# Patient Record
Sex: Female | Born: 1960 | Race: White | Hispanic: No | Smoking: Former smoker
Health system: Southern US, Community
[De-identification: ages and names within clinical notes are randomized; demographics above are authoritative.]

## PROBLEM LIST (undated history)

## (undated) DIAGNOSIS — M542 Cervicalgia: Secondary | ICD-10-CM

## (undated) DIAGNOSIS — M1990 Unspecified osteoarthritis, unspecified site: Secondary | ICD-10-CM

## (undated) DIAGNOSIS — E559 Vitamin D deficiency, unspecified: Secondary | ICD-10-CM

## (undated) DIAGNOSIS — K859 Acute pancreatitis without necrosis or infection, unspecified: Secondary | ICD-10-CM

## (undated) DIAGNOSIS — G8929 Other chronic pain: Secondary | ICD-10-CM

## (undated) DIAGNOSIS — J449 Chronic obstructive pulmonary disease, unspecified: Secondary | ICD-10-CM

## (undated) DIAGNOSIS — M792 Neuralgia and neuritis, unspecified: Secondary | ICD-10-CM

## (undated) DIAGNOSIS — M7918 Myalgia, other site: Secondary | ICD-10-CM

## (undated) DIAGNOSIS — K219 Gastro-esophageal reflux disease without esophagitis: Secondary | ICD-10-CM

## (undated) DIAGNOSIS — Z7901 Long term (current) use of anticoagulants: Secondary | ICD-10-CM

## (undated) DIAGNOSIS — A0472 Enterocolitis due to Clostridium difficile, not specified as recurrent: Secondary | ICD-10-CM

## (undated) DIAGNOSIS — M25561 Pain in right knee: Secondary | ICD-10-CM

## (undated) DIAGNOSIS — E119 Type 2 diabetes mellitus without complications: Secondary | ICD-10-CM

## (undated) DIAGNOSIS — K432 Incisional hernia without obstruction or gangrene: Secondary | ICD-10-CM

## (undated) DIAGNOSIS — G4730 Sleep apnea, unspecified: Secondary | ICD-10-CM

## (undated) DIAGNOSIS — M545 Low back pain, unspecified: Secondary | ICD-10-CM

## (undated) DIAGNOSIS — I2699 Other pulmonary embolism without acute cor pulmonale: Secondary | ICD-10-CM

## (undated) DIAGNOSIS — F1190 Opioid use, unspecified, uncomplicated: Secondary | ICD-10-CM

## (undated) DIAGNOSIS — F419 Anxiety disorder, unspecified: Secondary | ICD-10-CM

## (undated) DIAGNOSIS — I82409 Acute embolism and thrombosis of unspecified deep veins of unspecified lower extremity: Secondary | ICD-10-CM

## (undated) DIAGNOSIS — F329 Major depressive disorder, single episode, unspecified: Secondary | ICD-10-CM

## (undated) DIAGNOSIS — I10 Essential (primary) hypertension: Secondary | ICD-10-CM

## (undated) DIAGNOSIS — K5792 Diverticulitis of intestine, part unspecified, without perforation or abscess without bleeding: Secondary | ICD-10-CM

## (undated) DIAGNOSIS — F32A Depression, unspecified: Secondary | ICD-10-CM

## (undated) DIAGNOSIS — IMO0002 Reserved for concepts with insufficient information to code with codable children: Secondary | ICD-10-CM

## (undated) DIAGNOSIS — L659 Nonscarring hair loss, unspecified: Secondary | ICD-10-CM

## (undated) DIAGNOSIS — D1723 Benign lipomatous neoplasm of skin and subcutaneous tissue of right leg: Secondary | ICD-10-CM

## (undated) HISTORY — DX: Opioid use, unspecified, uncomplicated: F11.90

## (undated) HISTORY — DX: Vitamin D deficiency, unspecified: E55.9

## (undated) HISTORY — DX: Chronic obstructive pulmonary disease, unspecified: J44.9

## (undated) HISTORY — DX: Low back pain, unspecified: M54.50

## (undated) HISTORY — DX: Pain in right knee: M25.561

## (undated) HISTORY — DX: Cervicalgia: M54.2

## (undated) HISTORY — DX: Anxiety disorder, unspecified: F41.9

## (undated) HISTORY — PX: SKIN BIOPSY: SHX1

## (undated) HISTORY — DX: Long term (current) use of anticoagulants: Z79.01

## (undated) HISTORY — DX: Incisional hernia without obstruction or gangrene: K43.2

## (undated) HISTORY — DX: Other chronic pain: G89.29

## (undated) HISTORY — DX: Nonscarring hair loss, unspecified: L65.9

## (undated) HISTORY — DX: Gastro-esophageal reflux disease without esophagitis: K21.9

---
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
## 1989-08-01 DIAGNOSIS — K859 Acute pancreatitis without necrosis or infection, unspecified: Secondary | ICD-10-CM

## 1989-08-01 HISTORY — DX: Acute pancreatitis without necrosis or infection, unspecified: K85.90

## 1989-08-01 HISTORY — PX: CHOLECYSTECTOMY: SHX55

## 2001-12-25 ENCOUNTER — Emergency Department (HOSPITAL_COMMUNITY): Admission: EM | Admit: 2001-12-25 | Discharge: 2001-12-25 | Payer: Self-pay | Admitting: Emergency Medicine

## 2001-12-25 ENCOUNTER — Encounter: Payer: Self-pay | Admitting: Emergency Medicine

## 2003-04-30 ENCOUNTER — Encounter: Payer: Self-pay | Admitting: Emergency Medicine

## 2003-04-30 ENCOUNTER — Emergency Department (HOSPITAL_COMMUNITY): Admission: EM | Admit: 2003-04-30 | Discharge: 2003-05-01 | Payer: Self-pay | Admitting: Emergency Medicine

## 2004-05-14 ENCOUNTER — Emergency Department: Payer: Self-pay | Admitting: Unknown Physician Specialty

## 2004-05-14 ENCOUNTER — Other Ambulatory Visit: Payer: Self-pay

## 2004-06-03 ENCOUNTER — Emergency Department: Payer: Self-pay | Admitting: Internal Medicine

## 2005-08-17 ENCOUNTER — Inpatient Hospital Stay: Payer: Self-pay | Admitting: Internal Medicine

## 2005-08-26 ENCOUNTER — Emergency Department: Payer: Self-pay | Admitting: Internal Medicine

## 2005-08-31 ENCOUNTER — Emergency Department: Payer: Self-pay | Admitting: General Practice

## 2005-12-24 ENCOUNTER — Emergency Department: Payer: Self-pay | Admitting: Emergency Medicine

## 2006-05-27 ENCOUNTER — Emergency Department: Payer: Self-pay | Admitting: Internal Medicine

## 2006-08-06 ENCOUNTER — Inpatient Hospital Stay: Payer: Self-pay | Admitting: Internal Medicine

## 2006-08-06 ENCOUNTER — Other Ambulatory Visit: Payer: Self-pay

## 2006-09-08 ENCOUNTER — Emergency Department: Payer: Self-pay | Admitting: Unknown Physician Specialty

## 2006-12-04 ENCOUNTER — Emergency Department: Payer: Self-pay | Admitting: Emergency Medicine

## 2006-12-14 ENCOUNTER — Emergency Department: Payer: Self-pay | Admitting: Emergency Medicine

## 2008-01-01 ENCOUNTER — Emergency Department (HOSPITAL_COMMUNITY): Admission: EM | Admit: 2008-01-01 | Discharge: 2008-01-01 | Payer: Self-pay | Admitting: Emergency Medicine

## 2008-03-03 ENCOUNTER — Emergency Department (HOSPITAL_COMMUNITY): Admission: EM | Admit: 2008-03-03 | Discharge: 2008-03-03 | Payer: Self-pay | Admitting: Emergency Medicine

## 2008-10-08 ENCOUNTER — Emergency Department: Payer: Self-pay | Admitting: Emergency Medicine

## 2008-12-25 ENCOUNTER — Emergency Department: Payer: Self-pay | Admitting: Emergency Medicine

## 2009-01-10 ENCOUNTER — Emergency Department (HOSPITAL_COMMUNITY): Admission: EM | Admit: 2009-01-10 | Discharge: 2009-01-10 | Payer: Self-pay | Admitting: Emergency Medicine

## 2009-03-17 ENCOUNTER — Emergency Department: Payer: Self-pay | Admitting: Emergency Medicine

## 2009-05-19 ENCOUNTER — Emergency Department: Payer: Self-pay | Admitting: Emergency Medicine

## 2009-07-09 ENCOUNTER — Emergency Department: Payer: Self-pay | Admitting: Unknown Physician Specialty

## 2010-07-07 ENCOUNTER — Emergency Department: Payer: Self-pay | Admitting: Emergency Medicine

## 2010-07-16 ENCOUNTER — Emergency Department: Payer: Self-pay | Admitting: Emergency Medicine

## 2010-08-01 ENCOUNTER — Emergency Department (HOSPITAL_COMMUNITY)
Admission: EM | Admit: 2010-08-01 | Discharge: 2010-08-01 | Payer: Self-pay | Source: Home / Self Care | Admitting: Emergency Medicine

## 2010-08-17 ENCOUNTER — Emergency Department (HOSPITAL_COMMUNITY)
Admission: EM | Admit: 2010-08-17 | Discharge: 2010-08-17 | Payer: Self-pay | Source: Home / Self Care | Admitting: Emergency Medicine

## 2010-08-18 LAB — URINALYSIS, ROUTINE W REFLEX MICROSCOPIC
Bilirubin Urine: NEGATIVE
Hgb urine dipstick: NEGATIVE
Ketones, ur: NEGATIVE mg/dL
Nitrite: POSITIVE — AB
Protein, ur: NEGATIVE mg/dL
Specific Gravity, Urine: 1.015 (ref 1.005–1.030)
Urine Glucose, Fasting: NEGATIVE mg/dL
Urobilinogen, UA: 0.2 mg/dL (ref 0.0–1.0)
pH: 6 (ref 5.0–8.0)

## 2010-08-18 LAB — URINE MICROSCOPIC-ADD ON

## 2010-08-19 ENCOUNTER — Emergency Department (HOSPITAL_COMMUNITY)
Admission: EM | Admit: 2010-08-19 | Discharge: 2010-08-19 | Payer: Self-pay | Source: Home / Self Care | Admitting: Emergency Medicine

## 2010-08-23 LAB — POCT I-STAT, CHEM 8
BUN: 13 mg/dL (ref 6–23)
Calcium, Ion: 1.24 mmol/L (ref 1.12–1.32)
Chloride: 101 mEq/L (ref 96–112)
Creatinine, Ser: 1.2 mg/dL (ref 0.4–1.2)
Glucose, Bld: 137 mg/dL — ABNORMAL HIGH (ref 70–99)
HCT: 34 % — ABNORMAL LOW (ref 36.0–46.0)
Hemoglobin: 11.6 g/dL — ABNORMAL LOW (ref 12.0–15.0)
Potassium: 4.1 mEq/L (ref 3.5–5.1)
Sodium: 139 mEq/L (ref 135–145)
TCO2: 31 mmol/L (ref 0–100)

## 2010-08-23 LAB — URINE MICROSCOPIC-ADD ON

## 2010-08-23 LAB — URINALYSIS, ROUTINE W REFLEX MICROSCOPIC
Bilirubin Urine: NEGATIVE
Hgb urine dipstick: NEGATIVE
Ketones, ur: NEGATIVE mg/dL
Nitrite: NEGATIVE
Protein, ur: NEGATIVE mg/dL
Specific Gravity, Urine: 1.021 (ref 1.005–1.030)
Urine Glucose, Fasting: NEGATIVE mg/dL
Urobilinogen, UA: 1 mg/dL (ref 0.0–1.0)
pH: 7 (ref 5.0–8.0)

## 2010-08-23 LAB — URINE CULTURE
Colony Count: >=100000
Culture  Setup Time: 201201181145

## 2010-08-30 ENCOUNTER — Emergency Department: Payer: Self-pay | Admitting: Emergency Medicine

## 2010-10-11 LAB — BASIC METABOLIC PANEL
BUN: 13 mg/dL (ref 6–23)
CO2: 29 mEq/L (ref 19–32)
Calcium: 9.4 mg/dL (ref 8.4–10.5)
Chloride: 100 mEq/L (ref 96–112)
Creatinine, Ser: 0.71 mg/dL (ref 0.4–1.2)
GFR calc Af Amer: >60 mL/min (ref >60)
GFR calc non Af Amer: >60 mL/min (ref >60)
Glucose, Bld: 102 mg/dL — ABNORMAL HIGH (ref 70–99)
Potassium: 3.7 mEq/L (ref 3.5–5.1)
Sodium: 135 mEq/L (ref 135–145)

## 2010-10-11 LAB — CBC
HCT: 32.8 % — ABNORMAL LOW (ref 36.0–46.0)
Hemoglobin: 11 g/dL — ABNORMAL LOW (ref 12.0–15.0)
MCH: 28.4 pg (ref 26.0–34.0)
MCHC: 33.5 g/dL (ref 30.0–36.0)
MCV: 84.8 fL (ref 78.0–100.0)
Platelets: 310 10*3/uL (ref 150–400)
RBC: 3.87 MIL/uL (ref 3.87–5.11)
RDW: 13.1 % (ref 11.5–15.5)
WBC: 8.7 10*3/uL (ref 4.0–10.5)

## 2010-10-11 LAB — DIFFERENTIAL
Basophils Absolute: 0 10*3/uL (ref 0.0–0.1)
Basophils Relative: 1 % (ref 0–1)
Eosinophils Absolute: 0.2 10*3/uL (ref 0.0–0.7)
Eosinophils Relative: 2 % (ref 0–5)
Lymphocytes Relative: 24 % (ref 12–46)
Lymphs Abs: 2.1 10*3/uL (ref 0.7–4.0)
Monocytes Absolute: 0.5 10*3/uL (ref 0.1–1.0)
Monocytes Relative: 6 % (ref 3–12)
Neutro Abs: 5.9 10*3/uL (ref 1.7–7.7)
Neutrophils Relative %: 68 % (ref 43–77)

## 2010-10-11 LAB — LACTIC ACID, PLASMA: Lactic Acid, Venous: 1.4 mmol/L (ref 0.5–2.2)

## 2010-12-23 ENCOUNTER — Emergency Department: Payer: Self-pay | Admitting: Emergency Medicine

## 2011-02-16 ENCOUNTER — Emergency Department (HOSPITAL_COMMUNITY)
Admission: EM | Admit: 2011-02-16 | Discharge: 2011-02-17 | Disposition: A | Discharge status: Home / Self Care | Payer: Medicaid Other | Attending: Emergency Medicine | Admitting: Emergency Medicine

## 2011-02-16 DIAGNOSIS — R1084 Generalized abdominal pain: Secondary | ICD-10-CM

## 2011-02-16 DIAGNOSIS — L723 Sebaceous cyst: Secondary | ICD-10-CM | POA: Insufficient documentation

## 2011-02-16 DIAGNOSIS — Z79899 Other long term (current) drug therapy: Secondary | ICD-10-CM | POA: Insufficient documentation

## 2011-02-16 DIAGNOSIS — I10 Essential (primary) hypertension: Secondary | ICD-10-CM | POA: Insufficient documentation

## 2011-02-16 DIAGNOSIS — F3289 Other specified depressive episodes: Secondary | ICD-10-CM | POA: Insufficient documentation

## 2011-02-16 DIAGNOSIS — J45909 Unspecified asthma, uncomplicated: Secondary | ICD-10-CM | POA: Insufficient documentation

## 2011-02-16 DIAGNOSIS — F329 Major depressive disorder, single episode, unspecified: Secondary | ICD-10-CM | POA: Insufficient documentation

## 2011-02-16 DIAGNOSIS — N390 Urinary tract infection, site not specified: Secondary | ICD-10-CM

## 2011-02-16 HISTORY — DX: Depression, unspecified: F32.A

## 2011-02-16 HISTORY — DX: Reserved for concepts with insufficient information to code with codable children: IMO0002

## 2011-02-16 HISTORY — DX: Major depressive disorder, single episode, unspecified: F32.9

## 2011-02-16 HISTORY — DX: Unspecified osteoarthritis, unspecified site: M19.90

## 2011-02-16 HISTORY — DX: Acute pancreatitis without necrosis or infection, unspecified: K85.90

## 2011-02-16 HISTORY — DX: Essential (primary) hypertension: I10

## 2011-02-16 HISTORY — DX: Diverticulitis of intestine, part unspecified, without perforation or abscess without bleeding: K57.92

## 2011-02-16 NOTE — ED Notes (Signed)
Pt reports ab pain that started around 10:30am, no fever, no diarrhea, last bm today, +nausea, no vomiting

## 2011-02-17 LAB — URINALYSIS, ROUTINE W REFLEX MICROSCOPIC
Bilirubin Urine: NEGATIVE
Glucose, UA: NEGATIVE mg/dL
Hgb urine dipstick: NEGATIVE
Ketones, ur: NEGATIVE mg/dL
Nitrite: NEGATIVE
Protein, ur: NEGATIVE mg/dL
Specific Gravity, Urine: 1.025 (ref 1.005–1.030)
Urobilinogen, UA: 0.2 mg/dL (ref 0.0–1.0)
pH: 6 (ref 5.0–8.0)

## 2011-02-17 LAB — URINE MICROSCOPIC-ADD ON

## 2011-02-17 LAB — PREGNANCY, URINE: Preg Test, Ur: NEGATIVE

## 2011-02-17 MED ORDER — METRONIDAZOLE 500 MG PO TABS: 500.0000 mg | ORAL_TABLET | Freq: Two times a day (BID) | ORAL | Status: AC

## 2011-02-17 MED ORDER — CIPROFLOXACIN HCL 250 MG PO TABS
500.0000 mg | ORAL_TABLET | Freq: Once | ORAL | Status: AC
  Administered 2011-02-17: 500 mg via ORAL
  Filled 2011-02-17: qty 2

## 2011-02-17 MED ORDER — METRONIDAZOLE 500 MG PO TABS
500.0000 mg | ORAL_TABLET | Freq: Once | ORAL | Status: AC
  Administered 2011-02-17: 500 mg via ORAL
  Filled 2011-02-17: qty 1

## 2011-02-17 MED ORDER — CIPROFLOXACIN HCL 500 MG PO TABS: 500.0000 mg | ORAL_TABLET | Freq: Two times a day (BID) | ORAL | Status: AC

## 2011-02-17 MED ORDER — HYDROCODONE-ACETAMINOPHEN 5-325 MG PO TABS
2.0000 | ORAL_TABLET | Freq: Once | ORAL | Status: AC
  Administered 2011-02-17: 2 via ORAL
  Filled 2011-02-17: qty 2

## 2011-02-17 MED ORDER — HYDROCODONE-ACETAMINOPHEN 5-500 MG PO TABS: 1.0000 | ORAL_TABLET | ORAL | Status: AC | PRN

## 2011-02-17 MED ORDER — ONDANSETRON 4 MG PO TBDP
4.0000 mg | ORAL_TABLET | Freq: Once | ORAL | Status: AC
  Administered 2011-02-17: 4 mg via ORAL
  Filled 2011-02-17: qty 1

## 2011-02-17 NOTE — ED Provider Notes (Addendum)
History     Chief Complaint  Patient presents with  . Abdominal Pain   HPI Comments: Patient with history of recurrent diverticulitis who developed lower abdominal pain that began at 2 PM today. Pain is similar to previus pain she has experienced with diverticulitis. Denies fever, chills, nausea, vomiting, dysuria. Urine she states had a strong odor. She receives medical care from the family practice center at [facility].  Patient is a 50 y.o. female presenting with abdominal pain. The history is provided by the patient.  Abdominal Pain The primary symptoms of the illness include abdominal pain and diarrhea. The primary symptoms of the illness do not include fever, nausea, vomiting or dysuria. Primary symptoms comment: Pain began at 2 AM. same as previous episodes of diverticulitisl The current episode started 6 to 12 hours ago. The onset of the illness was sudden. The problem has been gradually worsening.  The pain came on suddenly. The abdominal pain has been gradually worsening since its onset. The abdominal pain radiates to the RLQ and LLQ. The severity of the abdominal pain is 6/10. The abdominal pain is relieved by nothing. Exacerbated by: standing.  The patient has had a change in bowel habit. Significant associated medical issues include inflammatory bowel disease.    Past Medical History  Diagnosis Date  . Hypertension   . Depression   . Borderline diabetic   . Diverticulitis   . Asthma   . Cyst     pt has "cyst" to legs for years, areas drain at time  . Pancreatitis   . Arthritis     Past Surgical History  Procedure Date  . Tonsillectomy   . Cholecystectomy   . Cesarean section   . Nose surgery     No family history on file.  History  Substance Use Topics  . Smoking status: Never Smoker   . Smokeless tobacco: Not on file  . Alcohol Use: No    OB History    Grav Para Term Preterm Abortions TAB SAB Ect Mult Living                  Review of Systems    Constitutional: Negative for fever.  Gastrointestinal: Positive for abdominal pain and diarrhea. Negative for nausea and vomiting.  Genitourinary: Negative for dysuria.  All other systems reviewed and are negative.    Physical Exam  BP 143/46  Pulse 100  Temp(Src) 99.3 F (37.4 C) (Oral)  Resp 22  Ht 5\' 6"  (1.676 m)  Wt 469 lb 3 oz (212.822 kg)  BMI 75.73 kg/m2  SpO2 100%  Physical Exam  Constitutional: She is oriented to person, place, and time. She appears well-developed and well-nourished. She appears distressed.       Morbidly obese  HENT:  Head: Normocephalic and atraumatic.  Eyes: EOM are normal. Pupils are equal, round, and reactive to light.  Neck: Normal range of motion. Neck supple.  Cardiovascular: Normal rate and normal heart sounds.   Pulmonary/Chest: Effort normal and breath sounds normal.  Abdominal: Soft. She exhibits no mass. There is tenderness. There is no rebound and no guarding.       morbidly obese, unable to appreciate organs. Mild discomfort with palpation to lower abdomen.  Musculoskeletal: Normal range of motion. She exhibits edema.  Neurological: She is alert and oriented to person, place, and time.  Skin: Skin is warm.    ED Course  Procedures  MDM       Nicoletta Dress. Colon Branch, MD  02/17/11 0006  Nicoletta Dress. Colon Branch, MD 02/17/11 [phone]

## 2011-02-17 NOTE — Progress Notes (Signed)
Pain is better after analgesics. Reviewed all results with patient.

## 2011-02-19 LAB — URINE CULTURE
Colony Count: >=100000
Culture  Setup Time: 201207192230

## 2011-02-20 NOTE — ED Notes (Signed)
Results rcvd from [facility].  (+) URNC, >/= 100,000 colonies -> Klebsiella Pneu.Marland Kitchen Rx given in ED for Cipro -> sensitive to the same.  Chart appended per protocol.

## 2011-04-04 ENCOUNTER — Emergency Department: Payer: Self-pay | Admitting: Emergency Medicine

## 2011-04-12 ENCOUNTER — Emergency Department: Payer: Self-pay | Admitting: *Deleted

## 2011-04-28 LAB — URINALYSIS, ROUTINE W REFLEX MICROSCOPIC
Glucose, UA: NEGATIVE
Ketones, ur: NEGATIVE
Leukocytes, UA: NEGATIVE
Nitrite: NEGATIVE
Specific Gravity, Urine: 1.025
Urobilinogen, UA: 2 — ABNORMAL HIGH
pH: 6

## 2011-04-28 LAB — PREGNANCY, URINE: Preg Test, Ur: NEGATIVE

## 2011-04-28 LAB — URINE MICROSCOPIC-ADD ON

## 2011-04-29 LAB — BASIC METABOLIC PANEL
BUN: 14
Chloride: 106
Glucose, Bld: 136 — ABNORMAL HIGH
Potassium: 3.8

## 2011-04-29 LAB — D-DIMER, QUANTITATIVE: D-Dimer, Quant: 0.4

## 2011-04-29 LAB — DIFFERENTIAL
Eosinophils Absolute: 0.1
Eosinophils Relative: 1
Lymphs Abs: 2

## 2011-04-29 LAB — POCT CARDIAC MARKERS: Troponin i, poc: <0.05

## 2011-04-29 LAB — CBC
HCT: 36.3
MCV: 85.6
Platelets: 245
WBC: 9.2

## 2011-05-27 ENCOUNTER — Emergency Department: Payer: Self-pay | Admitting: *Deleted

## 2011-11-06 ENCOUNTER — Emergency Department (HOSPITAL_COMMUNITY): Payer: Medicaid Other

## 2011-11-06 ENCOUNTER — Encounter (HOSPITAL_COMMUNITY): Payer: Self-pay

## 2011-11-06 ENCOUNTER — Other Ambulatory Visit: Payer: Self-pay

## 2011-11-06 ENCOUNTER — Inpatient Hospital Stay (HOSPITAL_COMMUNITY)
Admission: EM | Admit: 2011-11-06 | Discharge: 2011-11-10 | DRG: 372 | Disposition: A | Discharge status: Home / Self Care | Payer: Medicaid Other | Attending: Internal Medicine | Admitting: Internal Medicine

## 2011-11-06 DIAGNOSIS — I4949 Other premature depolarization: Secondary | ICD-10-CM | POA: Diagnosis present

## 2011-11-06 DIAGNOSIS — I493 Ventricular premature depolarization: Secondary | ICD-10-CM | POA: Diagnosis present

## 2011-11-06 DIAGNOSIS — E876 Hypokalemia: Secondary | ICD-10-CM | POA: Diagnosis not present

## 2011-11-06 DIAGNOSIS — M129 Arthropathy, unspecified: Secondary | ICD-10-CM | POA: Diagnosis present

## 2011-11-06 DIAGNOSIS — M47817 Spondylosis without myelopathy or radiculopathy, lumbosacral region: Secondary | ICD-10-CM | POA: Diagnosis present

## 2011-11-06 DIAGNOSIS — I10 Essential (primary) hypertension: Secondary | ICD-10-CM | POA: Diagnosis present

## 2011-11-06 DIAGNOSIS — K529 Noninfective gastroenteritis and colitis, unspecified: Secondary | ICD-10-CM

## 2011-11-06 DIAGNOSIS — F411 Generalized anxiety disorder: Secondary | ICD-10-CM | POA: Diagnosis present

## 2011-11-06 DIAGNOSIS — I498 Other specified cardiac arrhythmias: Secondary | ICD-10-CM | POA: Diagnosis not present

## 2011-11-06 DIAGNOSIS — J45909 Unspecified asthma, uncomplicated: Secondary | ICD-10-CM | POA: Diagnosis present

## 2011-11-06 DIAGNOSIS — F329 Major depressive disorder, single episode, unspecified: Secondary | ICD-10-CM | POA: Diagnosis present

## 2011-11-06 DIAGNOSIS — F32A Depression, unspecified: Secondary | ICD-10-CM | POA: Diagnosis present

## 2011-11-06 DIAGNOSIS — G5790 Unspecified mononeuropathy of unspecified lower limb: Secondary | ICD-10-CM | POA: Diagnosis present

## 2011-11-06 DIAGNOSIS — R002 Palpitations: Secondary | ICD-10-CM | POA: Diagnosis present

## 2011-11-06 DIAGNOSIS — R739 Hyperglycemia, unspecified: Secondary | ICD-10-CM

## 2011-11-06 DIAGNOSIS — R001 Bradycardia, unspecified: Secondary | ICD-10-CM | POA: Diagnosis present

## 2011-11-06 DIAGNOSIS — R197 Diarrhea, unspecified: Secondary | ICD-10-CM

## 2011-11-06 DIAGNOSIS — F341 Dysthymic disorder: Secondary | ICD-10-CM | POA: Diagnosis present

## 2011-11-06 DIAGNOSIS — M79609 Pain in unspecified limb: Secondary | ICD-10-CM | POA: Diagnosis present

## 2011-11-06 DIAGNOSIS — M7918 Myalgia, other site: Secondary | ICD-10-CM | POA: Diagnosis present

## 2011-11-06 DIAGNOSIS — Z6841 Body Mass Index (BMI) 40.0 and over, adult: Secondary | ICD-10-CM

## 2011-11-06 DIAGNOSIS — Z79899 Other long term (current) drug therapy: Secondary | ICD-10-CM

## 2011-11-06 DIAGNOSIS — A0472 Enterocolitis due to Clostridium difficile, not specified as recurrent: Principal | ICD-10-CM | POA: Diagnosis present

## 2011-11-06 DIAGNOSIS — R0789 Other chest pain: Secondary | ICD-10-CM | POA: Diagnosis present

## 2011-11-06 DIAGNOSIS — M792 Neuralgia and neuritis, unspecified: Secondary | ICD-10-CM

## 2011-11-06 DIAGNOSIS — E119 Type 2 diabetes mellitus without complications: Secondary | ICD-10-CM | POA: Diagnosis present

## 2011-11-06 DIAGNOSIS — K5730 Diverticulosis of large intestine without perforation or abscess without bleeding: Secondary | ICD-10-CM | POA: Diagnosis present

## 2011-11-06 HISTORY — DX: Neuralgia and neuritis, unspecified: M79.2

## 2011-11-06 HISTORY — DX: Enterocolitis due to Clostridium difficile, not specified as recurrent: A04.72

## 2011-11-06 HISTORY — DX: Type 2 diabetes mellitus without complications: E11.9

## 2011-11-06 HISTORY — DX: Myalgia, other site: M79.18

## 2011-11-06 LAB — MAGNESIUM: Magnesium: 1.5 mg/dL (ref 1.5–2.5)

## 2011-11-06 LAB — PRO B NATRIURETIC PEPTIDE: Pro B Natriuretic peptide (BNP): 1229 pg/mL — ABNORMAL HIGH (ref 0–125)

## 2011-11-06 LAB — COMPREHENSIVE METABOLIC PANEL
AST: 34 U/L (ref 0–37)
Albumin: 3.6 g/dL (ref 3.5–5.2)
Calcium: 9.5 mg/dL (ref 8.4–10.5)
Creatinine, Ser: 0.78 mg/dL (ref 0.50–1.10)

## 2011-11-06 LAB — DIFFERENTIAL
Band Neutrophils: 0 % (ref 0–10)
Basophils Absolute: 0 10*3/uL (ref 0.0–0.1)
Basophils Relative: 0 % (ref 0–1)
Eosinophils Absolute: 0.1 10*3/uL (ref 0.0–0.7)
Eosinophils Relative: 1 % (ref 0–5)
Lymphocytes Relative: 5 % — ABNORMAL LOW (ref 12–46)
Lymphs Abs: 0.7 10*3/uL (ref 0.7–4.0)
Monocytes Absolute: 0.6 10*3/uL (ref 0.1–1.0)
Monocytes Relative: 4 % (ref 3–12)
Smear Review: ADEQUATE

## 2011-11-06 LAB — CBC
HCT: 38.2 % (ref 36.0–46.0)
Hemoglobin: 12.6 g/dL (ref 12.0–15.0)
MCHC: 33 g/dL (ref 30.0–36.0)
RBC: 4.47 MIL/uL (ref 3.87–5.11)
WBC: 14.9 10*3/uL — ABNORMAL HIGH (ref 4.0–10.5)

## 2011-11-06 LAB — D-DIMER, QUANTITATIVE: D-Dimer, Quant: 0.26 ug/mL-FEU (ref 0.00–0.48)

## 2011-11-06 LAB — APTT: aPTT: 32 seconds (ref 24–37)

## 2011-11-06 LAB — PROTIME-INR: Prothrombin Time: 13.8 seconds (ref 11.6–15.2)

## 2011-11-06 MED ORDER — LORAZEPAM 2 MG/ML IJ SOLN
1.0000 mg | Freq: Once | INTRAMUSCULAR | Status: AC
  Administered 2011-11-06: 1 mg via INTRAVENOUS
  Filled 2011-11-06: qty 1

## 2011-11-06 MED ORDER — SODIUM CHLORIDE 0.9 % IV BOLUS (SEPSIS)
1000.0000 mL | Freq: Once | INTRAVENOUS | Status: AC
  Administered 2011-11-06: 1000 mL via INTRAVENOUS

## 2011-11-06 MED ORDER — METOPROLOL TARTRATE 1 MG/ML IV SOLN
5.0000 mg | Freq: Once | INTRAVENOUS | Status: DC
  Filled 2011-11-06: qty 5

## 2011-11-06 MED ORDER — ESCITALOPRAM OXALATE 10 MG PO TABS
20.0000 mg | ORAL_TABLET | Freq: Every day | ORAL | Status: DC
  Administered 2011-11-07 – 2011-11-10 (×4): 20 mg via ORAL
  Filled 2011-11-06: qty 1
  Filled 2011-11-06 (×2): qty 2
  Filled 2011-11-06: qty 1
  Filled 2011-11-06 (×2): qty 2

## 2011-11-06 MED ORDER — SODIUM CHLORIDE 0.9 % IV SOLN
INTRAVENOUS | Status: DC
  Administered 2011-11-06: via INTRAVENOUS

## 2011-11-06 MED ORDER — ONDANSETRON HCL 4 MG/2ML IJ SOLN
4.0000 mg | INTRAMUSCULAR | Status: DC | PRN
  Administered 2011-11-06 (×2): 4 mg via INTRAVENOUS
  Filled 2011-11-06 (×2): qty 2

## 2011-11-06 MED ORDER — BUPROPION HCL ER (XL) 300 MG PO TB24
300.0000 mg | ORAL_TABLET | Freq: Every day | ORAL | Status: DC
  Administered 2011-11-07 – 2011-11-10 (×4): 300 mg via ORAL
  Filled 2011-11-06 (×5): qty 1

## 2011-11-06 NOTE — ED Notes (Signed)
Pt also reports has yeast infection under abd that is not getting any better.

## 2011-11-06 NOTE — ED Notes (Signed)
TOPONIN 0.01 REPORTED TO DR. IVA KNAPP.

## 2011-11-06 NOTE — ED Provider Notes (Signed)
History   Scribed for Ward Givens, MD, the patient was seen in [facility]. The chart was scribed by Gilman Schmidt. The patients care was started at 11:35 PM.   CSN: [id number]  Arrival date & time 11/06/11  4098   First MD Initiated Contact with Patient 11/06/11 1848      Chief Complaint  Patient presents with  . Emesis  . Diarrhea  . Shortness of Breath    (Consider location/radiation/quality/duration/timing/severity/associated sxs/prior treatment) HPI Stacey [name] is a 51 y.o. female who presents to the Emergency Department complaining of emesis (2-3x) and diarrhea(4-5x) onset last night. Pt also notes nausea, chills,, RUQ and epigastric abdominal pain, dizziness, wheezing, and SOB.  Notes SOB is chronic but exacerbated upon walking within the last few hours. Also states her chest started feeling heaviness a few hours ago. Denies any diaphoresis. Also states that she noticed she would would stop breathing as she dosed off. Notes  sick contacts at home, her exhusband came over last week and now her sons have the GI symptoms but they are getting better. . Also reports chronic generalized pain that has magnified. Pt also reports yeast infection on abdomen under folds of her abdomen. Pt states she is allerigic to nitroglycerin (causes anxiety). There are no other associated symptoms and no other alleviating or aggravating factors.   Pt relates she had a nuclear stress test done in the past year that was okay. She was having chest heaviness at that time also.   PCP [facility]   Past Medical History  Diagnosis Date  . Hypertension   . Depression   . Borderline diabetic   . Diverticulitis   . Asthma   . Cyst     pt has "cyst" to legs for years, areas drain at time  . Pancreatitis   . Arthritis     Past Surgical History  Procedure Date  . Tonsillectomy   . Cholecystectomy 1991  . Cesarean section   . Nose surgery     No family history on file. Heart problems in  grandmothers on both sides of family, mother (CHF and had heart attack) , sister (heart attack at 45, now 24 yo)  History  Substance Use Topics  . Smoking status: Never Smoker   . Smokeless tobacco: Not on file  . Alcohol Use: No  no drug use  OB History    Grav Para Term Preterm Abortions TAB SAB Ect Mult Living                  Review of Systems  Constitutional: Positive for chills. Negative for diaphoresis.  Respiratory: Positive for shortness of breath and wheezing.   Cardiovascular: Positive for chest pain.  Gastrointestinal: Positive for nausea, vomiting, abdominal pain and diarrhea.  Neurological: Positive for dizziness.  All other systems reviewed and are negative.    Allergies  Dilaudid and Sulfa antibiotics  Home Medications   Current Outpatient Rx  Name Route Sig Dispense Refill  . BUPROPION HCL ER (XL) 300 MG PO TB24 Oral Take 300 mg by mouth daily.      Marland Kitchen ESCITALOPRAM OXALATE 20 MG PO TABS Oral Take 20 mg by mouth daily.      Marland Kitchen HYDROCHLOROTHIAZIDE 25 MG PO TABS Oral Take 25 mg by mouth daily.    . IBUPROFEN 800 MG PO TABS Oral Take 800 mg by mouth 3 (three) times daily.       BP 178/57  Pulse 115  Temp(Src) 98.1 F (  36.7 C) (Oral)  Resp 22  Ht 5\' 6"  (1.676 m)  Wt 460 lb (208.655 kg)  BMI 74.25 kg/m2  SpO2 98%  LMP 10/06/2011  Vital signs normal except tachycardia, hypertension   Physical Exam  Constitutional: She is oriented to person, place, and time. She appears well-developed and well-nourished.       Morbidly obese   HENT:  Head: Normocephalic and atraumatic.  Right Ear: External ear normal.  Left Ear: External ear normal.  Mouth/Throat: Mucous membranes are dry.       Tongue dry   Eyes: Conjunctivae and EOM are normal. Pupils are equal, round, and reactive to light.  Neck: Neck supple. No tracheal deviation present. No thyromegaly present.  Cardiovascular: Normal rate.  An irregular rhythm present.  No murmur heard. Pulmonary/Chest:  Effort normal and breath sounds normal.  Abdominal: Soft. Bowel sounds are normal. She exhibits no distension. There is no tenderness. There is no rebound and no guarding.       Indicates epigastric pain, but didn't react to examination of that area  Musculoskeletal: Normal range of motion. She exhibits no edema and no tenderness.       obese  Neurological: She is alert and oriented to person, place, and time. Coordination normal.  Skin: Skin is warm and dry. No rash noted.  Psychiatric: She has a normal mood and affect. Her behavior is normal. Thought content normal.    ED Course  Procedures (including critical care time)   Medications  0.9 %  sodium chloride infusion (not administered)  ondansetron (ZOFRAN) injection 4 mg (4 mg Intravenous Given 11/06/11 2010)  sodium chloride 0.9 % bolus 1,000 mL (1000 mL Intravenous Given 11/06/11 1953)   Pt given IV fluids for her dehydration by physical exam   DIAGNOSTIC STUDIES: Oxygen Saturation is 99% on [place], normal by my interpretation.    COORDINATION OF CARE: 7:19pm:  - Patient evaluated by ED physician, Zofran, DG Chest, D-dimer, Pro b natriuretic, Diff, CMP, Magnesium, APTT, Protime-INR ordered  Pt continues to have ventricular bigeminy and couplets.  22:42 Dr Orvan Falconer, admit to observation to him, tele  LABS Results for orders placed during the hospital encounter of 11/06/11  CBC      Component Value Range   WBC 14.9 (*) 4.0 - 10.5 (K/uL)   RBC 4.47  3.87 - 5.11 (MIL/uL)   Hemoglobin 12.6  12.0 - 15.0 (g/dL)   HCT 40.9  81.1 - 91.4 (%)   MCV 85.5  78.0 - 100.0 (fL)   MCH 28.2  26.0 - 34.0 (pg)   MCHC 33.0  30.0 - 36.0 (g/dL)   RDW 78.2  95.6 - 21.3 (%)   Platelets 236  150 - 400 (K/uL)  DIFFERENTIAL      Component Value Range   Neutrophils Relative 90 (*) 43 - 77 (%)   Lymphocytes Relative 5 (*) 12 - 46 (%)   Monocytes Relative 4  3 - 12 (%)   Eosinophils Relative 1  0 - 5 (%)   Basophils Relative 0  0 - 1 (%)   Band  Neutrophils 0  0 - 10 (%)   Metamyelocytes Relative 0     Myelocytes 0     Promyelocytes Absolute 0     Blasts 0     nRBC 0  0 (/100 WBC)   Neutro Abs 13.5 (*) 1.7 - 7.7 (K/uL)   Lymphs Abs 0.7  0.7 - 4.0 (K/uL)   Monocytes Absolute 0.6  0.1 -  1.0 (K/uL)   Eosinophils Absolute 0.1  0.0 - 0.7 (K/uL)   Basophils Absolute 0.0  0.0 - 0.1 (K/uL)   Smear Review       Value: PLATELET CLUMPS NOTED ON SMEAR, COUNT APPEARS ADEQUATE  COMPREHENSIVE METABOLIC PANEL      Component Value Range   Sodium 134 (*) 135 - 145 (mEq/L)   Potassium 4.2  3.5 - 5.1 (mEq/L)   Chloride 98  96 - 112 (mEq/L)   CO2 22  19 - 32 (mEq/L)   Glucose, Bld 189 (*) 70 - 99 (mg/dL)   BUN 18  6 - 23 (mg/dL)   Creatinine, Ser 6.96  0.50 - 1.10 (mg/dL)   Calcium 9.5  8.4 - 29.5 (mg/dL)   Total Protein 7.2  6.0 - 8.3 (g/dL)   Albumin 3.6  3.5 - 5.2 (g/dL)   AST 34  0 - 37 (U/L)   ALT 35  0 - 35 (U/L)   Alkaline Phosphatase 69  39 - 117 (U/L)   Total Bilirubin 1.0  0.3 - 1.2 (mg/dL)   GFR calc non Af Amer >90  >90 (mL/min)   GFR calc Af Amer >90  >90 (mL/min)  MAGNESIUM      Component Value Range   Magnesium 1.5  1.5 - 2.5 (mg/dL)  APTT      Component Value Range   aPTT 32  24 - 37 (seconds)  PROTIME-INR      Component Value Range   Prothrombin Time 13.8  11.6 - 15.2 (seconds)   INR 1.04  0.00 - 1.49   D-DIMER, QUANTITATIVE      Component Value Range   D-Dimer, Quant 0.26  0.00 - 0.48 (ug/mL-FEU)  PRO B NATRIURETIC PEPTIDE      Component Value Range   Pro B Natriuretic peptide (BNP) 1229.0 (*) 0 - 125 (pg/mL)  POCT I-STAT TROPONIN I      Component Value Range   Troponin i, poc 0.01  0.00 - 0.08 (ng/mL)   Comment 3            Laboratory interpretation all normal except elevated BNP, hyperglycemia    Radiology: DG Chest. Reviewed by me. IMPRESSION: Normal chest. Original Report Authenticated By: Gwynn Burly, M.D.   Date: 11/06/2011  Rate: 107  Rhythm: normal sinus rhythm and premature  ventricular contractions (PVC)  QRS Axis: normal  Intervals: normal  ST/T Wave abnormalities: normal  Conduction Disutrbances:none  Narrative Interpretation: ventricular bigeminy, couplets  Old EKG Reviewed: changes noted from 03/03/2008      1. Nausea vomiting and diarrhea   2. Ventricular bigeminy   3. Hyperglycemia without ketosis   4. Chest pain    Plan admission to observation for telemetry  Devoria Albe, MD, FACEP   MDM   I personally performed the services described in this documentation, which was scribed in my presence. The recorded information has been reviewed and considered. Devoria Albe, MD, Armando Gang         Ward Givens, MD 11/06/11 [phone]

## 2011-11-06 NOTE — ED Notes (Signed)
Pt reports wasn't feeling well yesterday and today started having n/v/d and SOB.  Reports sob worse with exertion.

## 2011-11-07 ENCOUNTER — Encounter (HOSPITAL_COMMUNITY): Payer: Self-pay | Admitting: *Deleted

## 2011-11-07 DIAGNOSIS — R0789 Other chest pain: Secondary | ICD-10-CM | POA: Diagnosis present

## 2011-11-07 DIAGNOSIS — E876 Hypokalemia: Secondary | ICD-10-CM | POA: Diagnosis not present

## 2011-11-07 DIAGNOSIS — I493 Ventricular premature depolarization: Secondary | ICD-10-CM | POA: Diagnosis present

## 2011-11-07 DIAGNOSIS — I517 Cardiomegaly: Secondary | ICD-10-CM

## 2011-11-07 LAB — HEMOGLOBIN A1C: Mean Plasma Glucose: 143 mg/dL — ABNORMAL HIGH (ref <117)

## 2011-11-07 LAB — COMPREHENSIVE METABOLIC PANEL
AST: 23 U/L (ref 0–37)
Albumin: 3.3 g/dL — ABNORMAL LOW (ref 3.5–5.2)
Alkaline Phosphatase: 61 U/L (ref 39–117)
BUN: 21 mg/dL (ref 6–23)
Potassium: 3.3 mEq/L — ABNORMAL LOW (ref 3.5–5.1)
Sodium: 136 mEq/L (ref 135–145)
Total Protein: 6.4 g/dL (ref 6.0–8.3)

## 2011-11-07 LAB — T4, FREE: Free T4: 0.87 ng/dL (ref 0.80–1.80)

## 2011-11-07 LAB — URINALYSIS, ROUTINE W REFLEX MICROSCOPIC
Ketones, ur: NEGATIVE mg/dL
Nitrite: NEGATIVE
Protein, ur: NEGATIVE mg/dL
Urobilinogen, UA: 0.2 mg/dL (ref 0.0–1.0)

## 2011-11-07 LAB — CARDIAC PANEL(CRET KIN+CKTOT+MB+TROPI)
CK, MB: 1.4 ng/mL (ref 0.3–4.0)
CK, MB: 1.9 ng/mL (ref 0.3–4.0)
Relative Index: 1.2 (ref 0.0–2.5)
Total CK: 76 U/L (ref 7–177)
Total CK: 165 U/L (ref 7–177)
Troponin I: <0.3 ng/mL (ref <0.30)
Troponin I: <0.3 ng/mL (ref <0.30)

## 2011-11-07 LAB — CBC
HCT: 35 % — ABNORMAL LOW (ref 36.0–46.0)
MCHC: 33.1 g/dL (ref 30.0–36.0)
Platelets: 241 10*3/uL (ref 150–400)
RDW: 13.6 % (ref 11.5–15.5)

## 2011-11-07 LAB — MAGNESIUM: Magnesium: 1.5 mg/dL (ref 1.5–2.5)

## 2011-11-07 LAB — URINE MICROSCOPIC-ADD ON

## 2011-11-07 LAB — GLUCOSE, CAPILLARY
Glucose-Capillary: 138 mg/dL — ABNORMAL HIGH (ref 70–99)
Glucose-Capillary: 133 mg/dL — ABNORMAL HIGH (ref 70–99)

## 2011-11-07 MED ORDER — ACETAMINOPHEN 325 MG PO TABS
650.0000 mg | ORAL_TABLET | Freq: Four times a day (QID) | ORAL | Status: DC | PRN
  Administered 2011-11-07: 650 mg via ORAL
  Filled 2011-11-07: qty 2

## 2011-11-07 MED ORDER — ACETAMINOPHEN 650 MG RE SUPP: 650.0000 mg | Freq: Four times a day (QID) | RECTAL | Status: DC | PRN

## 2011-11-07 MED ORDER — INSULIN GLARGINE 100 UNIT/ML ~~LOC~~ SOLN
5.0000 [IU] | Freq: Every day | SUBCUTANEOUS | Status: DC
  Administered 2011-11-07 – 2011-11-09 (×3): 5 [IU] via SUBCUTANEOUS

## 2011-11-07 MED ORDER — LOPERAMIDE HCL 2 MG PO CAPS
4.0000 mg | ORAL_CAPSULE | Freq: Once | ORAL | Status: DC
  Administered 2011-11-07: 4 mg via ORAL
  Filled 2011-11-07: qty 2

## 2011-11-07 MED ORDER — ACETAMINOPHEN 325 MG PO TABS
ORAL_TABLET | ORAL | Status: AC
  Administered 2011-11-07: 650 mg via ORAL
  Filled 2011-11-07: qty 2

## 2011-11-07 MED ORDER — ONDANSETRON HCL 4 MG/2ML IJ SOLN
4.0000 mg | Freq: Four times a day (QID) | INTRAMUSCULAR | Status: AC | PRN
  Administered 2011-11-07: 4 mg via INTRAVENOUS
  Filled 2011-11-07: qty 2

## 2011-11-07 MED ORDER — FAMOTIDINE IN NACL 20-0.9 MG/50ML-% IV SOLN
20.0000 mg | Freq: Two times a day (BID) | INTRAVENOUS | Status: DC
  Administered 2011-11-07 (×2): 20 mg via INTRAVENOUS
  Filled 2011-11-07 (×3): qty 50

## 2011-11-07 MED ORDER — LORAZEPAM 1 MG PO TABS
2.0000 mg | ORAL_TABLET | Freq: Three times a day (TID) | ORAL | Status: DC | PRN
  Administered 2011-11-07 – 2011-11-08 (×2): 2 mg via ORAL
  Filled 2011-11-07 (×2): qty 2

## 2011-11-07 MED ORDER — METOPROLOL TARTRATE 25 MG PO TABS
12.5000 mg | ORAL_TABLET | Freq: Two times a day (BID) | ORAL | Status: DC
  Administered 2011-11-07 – 2011-11-08 (×3): 12.5 mg via ORAL
  Filled 2011-11-07 (×3): qty 1

## 2011-11-07 MED ORDER — DILTIAZEM HCL 30 MG PO TABS
30.0000 mg | ORAL_TABLET | Freq: Four times a day (QID) | ORAL | Status: DC
  Administered 2011-11-07 – 2011-11-09 (×10): 30 mg via ORAL
  Filled 2011-11-07 (×10): qty 1

## 2011-11-07 MED ORDER — INSULIN ASPART 100 UNIT/ML ~~LOC~~ SOLN
0.0000 [IU] | Freq: Three times a day (TID) | SUBCUTANEOUS | Status: DC
  Administered 2011-11-07: 1 [IU] via SUBCUTANEOUS

## 2011-11-07 MED ORDER — PANTOPRAZOLE SODIUM 40 MG IV SOLR: 40.0000 mg | INTRAVENOUS | Status: DC

## 2011-11-07 MED ORDER — ONDANSETRON HCL 4 MG PO TABS: 4.0000 mg | ORAL_TABLET | Freq: Four times a day (QID) | ORAL | Status: DC | PRN

## 2011-11-07 MED ORDER — MAGNESIUM SULFATE 50 % IJ SOLN: 1.0000 g | Freq: Once | INTRAVENOUS | Status: DC

## 2011-11-07 MED ORDER — IPRATROPIUM BROMIDE 0.02 % IN SOLN
0.5000 mg | Freq: Four times a day (QID) | RESPIRATORY_TRACT | Status: DC | PRN
  Administered 2011-11-08 – 2011-11-09 (×4): 0.5 mg via RESPIRATORY_TRACT
  Filled 2011-11-07 (×4): qty 2.5

## 2011-11-07 MED ORDER — METRONIDAZOLE 500 MG PO TABS
500.0000 mg | ORAL_TABLET | Freq: Three times a day (TID) | ORAL | Status: DC
  Administered 2011-11-07 – 2011-11-10 (×9): 500 mg via ORAL
  Filled 2011-11-07 (×9): qty 1

## 2011-11-07 MED ORDER — SODIUM CHLORIDE 0.9 % IV SOLN: INTRAVENOUS | Status: DC

## 2011-11-07 MED ORDER — POTASSIUM CHLORIDE CRYS ER 10 MEQ PO TBCR
10.0000 meq | EXTENDED_RELEASE_TABLET | Freq: Two times a day (BID) | ORAL | Status: AC
  Administered 2011-11-07 (×2): 10 meq via ORAL
  Filled 2011-11-07 (×2): qty 1

## 2011-11-07 MED ORDER — MORPHINE SULFATE 4 MG/ML IJ SOLN
4.0000 mg | INTRAMUSCULAR | Status: DC | PRN
  Administered 2011-11-07 – 2011-11-08 (×4): 4 mg via INTRAVENOUS
  Filled 2011-11-07 (×4): qty 1

## 2011-11-07 MED ORDER — TRAZODONE HCL 50 MG PO TABS
25.0000 mg | ORAL_TABLET | Freq: Every evening | ORAL | Status: DC | PRN
  Administered 2011-11-07: 25 mg via ORAL
  Filled 2011-11-07: qty 1

## 2011-11-07 MED ORDER — MAGNESIUM SULFATE IN D5W 10-5 MG/ML-% IV SOLN
1.0000 g | Freq: Once | INTRAVENOUS | Status: AC
  Administered 2011-11-07: 1 g via INTRAVENOUS
  Filled 2011-11-07: qty 100

## 2011-11-07 MED ORDER — POTASSIUM CHLORIDE IN NACL 40-0.9 MEQ/L-% IV SOLN
INTRAVENOUS | Status: DC
  Administered 2011-11-07: 75 mL/h via INTRAVENOUS
  Administered 2011-11-08 (×2): via INTRAVENOUS
  Filled 2011-11-07 (×4): qty 1000

## 2011-11-07 MED ORDER — LOPERAMIDE HCL 2 MG PO CAPS
2.0000 mg | ORAL_CAPSULE | ORAL | Status: DC | PRN
  Administered 2011-11-07: 2 mg via ORAL
  Filled 2011-11-07 (×2): qty 1

## 2011-11-07 MED ORDER — INSULIN ASPART 100 UNIT/ML ~~LOC~~ SOLN: 0.0000 [IU] | Freq: Every day | SUBCUTANEOUS | Status: DC

## 2011-11-07 MED ORDER — ACETAMINOPHEN 500 MG PO TABS
1000.0000 mg | ORAL_TABLET | Freq: Once | ORAL | Status: AC
  Administered 2011-11-07: 650 mg via ORAL

## 2011-11-07 MED ORDER — ONDANSETRON HCL 4 MG/2ML IJ SOLN: 4.0000 mg | INTRAMUSCULAR | Status: DC | PRN

## 2011-11-07 MED ORDER — POTASSIUM CHLORIDE IN NACL 20-0.9 MEQ/L-% IV SOLN
INTRAVENOUS | Status: DC
  Administered 2011-11-07: 1000 mL via INTRAVENOUS

## 2011-11-07 MED ORDER — ENOXAPARIN SODIUM 40 MG/0.4ML ~~LOC~~ SOLN
40.0000 mg | Freq: Every day | SUBCUTANEOUS | Status: DC
  Administered 2011-11-07 – 2011-11-08 (×2): 40 mg via SUBCUTANEOUS
  Filled 2011-11-07 (×2): qty 0.4

## 2011-11-07 NOTE — Progress Notes (Signed)
   CARE MANAGEMENT NOTE 11/07/2011  Patient:  Stacey [name], Stacey [name]   Account Number:  [id number]  Date Initiated:  11/07/2011  Documentation initiated by:  Rosemary Holms  Subjective/Objective Assessment:   Pt lived at home with children and parents. Admitted with gastroenteritis. Baratric but per RN able to get up.     Action/Plan:   Spoke to pt at bedside. She states that she is unable to walk well due to her R. leg, very painful.   Anticipated DC Date:  11/09/2011   Anticipated DC Plan:  HOME W HOME HEALTH SERVICES      DC Planning Services  CM consult      Choice offered to / List presented to:             Status of service:  In process, will continue to follow Medicare Important Message given?   (If response is "NO", the following Medicare IM given date fields will be blank) Date Medicare IM given:   Date Additional Medicare IM given:    Discharge Disposition:    Per UR Regulation:    If discussed at Long Length of Stay Meetings, dates discussed:    Comments:  11/07/11 1100 Tayshaun Kroh Leanord Hawking RN BSN CM

## 2011-11-07 NOTE — Plan of Care (Signed)
Problem: Phase I Progression Outcomes Goal: Pain controlled with appropriate interventions Outcome: Completed/Met Date Met:  11/07/11 With new order of Morphine

## 2011-11-07 NOTE — H&P (Signed)
PCP:   No primary provider on file.   Chief Complaint:  Chest discomfort and palpitations since this afternoon  HPI: Stacey [name] is an 51 y.o. female.   Morbidly obese Caucasian lady history of hypertension and borderline diabetes developed nausea yesterday and frank vomiting and diarrhea since today. Other family members have been having a similar symptoms, and she has been self-medicating with Pepto-Bismol. However, since today she is noted palpitations and chest and feeling really sick. She has been told she has an irregular heartbeat in the past, and had a stress test done in [place] [facility] years ago which was inconclusive.  Of note she suffers with anxiety and has not taken her Xanax because of the nausea and vomiting.  She denies fever the frank chest pain or syncope; she denied diaphoresis passage of bloody or black stool.    Rewiew of Systems:  The patient denies anorexia, fever, weight loss,, vision loss, decreased hearing, hoarseness, chest pain, syncope,   balance deficits, hemoptysis, abdominal pain, melena, hematochezia, severe indigestion/heartburn, hematuria, incontinence, genital sores, muscle weakness, suspicious skin lesions, transient blindness, difficulty walking, depression, unusual weight change, abnormal bleeding, enlarged lymph nodes, angioedema, and breast masses.    Past Medical History  Diagnosis Date  . Hypertension   . Depression   . Borderline diabetic   . Diverticulitis   . Asthma   . Cyst     pt has "cyst" to legs for years, areas drain at time  . Pancreatitis   . Arthritis     Past Surgical History  Procedure Date  . Tonsillectomy   . Cholecystectomy   . Cesarean section   . Nose surgery     Medications:  HOME MEDS: Prior to Admission medications   Medication Sig Start Date End Date Taking? Authorizing Provider  buPROPion (WELLBUTRIN XL) 300 MG 24 hr tablet Take 300 mg by mouth daily.     Yes Historical Provider, MD  escitalopram  (LEXAPRO) 20 MG tablet Take 20 mg by mouth daily.     Yes Historical Provider, MD  hydrochlorothiazide (HYDRODIURIL) 25 MG tablet Take 25 mg by mouth daily.   Yes Historical Provider, MD  ibuprofen (ADVIL,MOTRIN) 800 MG tablet Take 800 mg by mouth 3 (three) times daily.    Yes Historical Provider, MD     Allergies:  Allergies  Allergen Reactions  . Dilaudid (Hydromorphone Hcl)   . Sulfa Antibiotics     Social History:   reports that she has never smoked. She does not have any smokeless tobacco history on file. She reports that she does not drink alcohol or use illicit drugs.  Family History: No family history on file.   Physical Exam: Filed Vitals:   11/06/11 1900 11/06/11 1912 11/06/11 2048 11/06/11 2206  BP:   129/35 178/57  Pulse: 64 104 98 115  Temp:      TempSrc:      Resp: 14   22  Height:      Weight:      SpO2: 98%  97% 98%   Blood pressure 178/57, pulse 115, temperature 98.1 F (36.7 C), temperature source Oral, resp. rate 22, height 5\' 6"  (1.676 m), weight 208.655 kg (460 lb), last menstrual period 10/06/2011, SpO2 98.00%.  GEN: Ill-looking morbidly obese Caucasian lady sitting up in an oversized chair; cooperative with exam PSYCH:  alert and oriented x4; extremely anxious  HEENT: Mucous membranes pink, dry and anicteric; very thick neck Breasts:: Not examined CHEST WALL: No tenderness CHEST: Normal  respiration, clear to auscultation bilaterally HEART: Irregular rhythm; distant heart sounds ,no murmurs rubs or gallops BACK: no CVA tenderness ABDOMEN: Massively obese, epigastric tenderness mild; no masses, no organomegaly, normal abdominal bowel sounds; huge pannus; no intertriginous candida. Rectal Exam: Not done EXTREMITIES: No bone or joint deformity; other vital caused by her massive obesity trace edema; no ulcerations. Genitalia: not examined PULSES: 2+ and symmetric SKIN: Normal hydration no rash or ulceration CNS: Cranial nerves 2-12 grossly intact no  focal lateralizing neurologic deficit   Labs & Imaging Results for orders placed during the hospital encounter of 11/06/11 (from the past 48 hour(s))  CBC     Status: Abnormal   Collection Time   11/06/11  7:31 PM      Component Value Range Comment   WBC 14.9 (*) 4.0 - 10.5 (K/uL)    RBC 4.47  3.87 - 5.11 (MIL/uL)    Hemoglobin 12.6  12.0 - 15.0 (g/dL)    HCT 04.5  40.9 - 81.1 (%)    MCV 85.5  78.0 - 100.0 (fL)    MCH 28.2  26.0 - 34.0 (pg)    MCHC 33.0  30.0 - 36.0 (g/dL)    RDW 91.4  78.2 - 95.6 (%)    Platelets 236  150 - 400 (K/uL)   DIFFERENTIAL     Status: Abnormal   Collection Time   11/06/11  7:31 PM      Component Value Range Comment   Neutrophils Relative 90 (*) 43 - 77 (%)    Lymphocytes Relative 5 (*) 12 - 46 (%)    Monocytes Relative 4  3 - 12 (%)    Eosinophils Relative 1  0 - 5 (%)    Basophils Relative 0  0 - 1 (%)    Band Neutrophils 0  0 - 10 (%)    Metamyelocytes Relative 0      Myelocytes 0      Promyelocytes Absolute 0      Blasts 0      nRBC 0  0 (/100 WBC)    Neutro Abs 13.5 (*) 1.7 - 7.7 (K/uL)    Lymphs Abs 0.7  0.7 - 4.0 (K/uL)    Monocytes Absolute 0.6  0.1 - 1.0 (K/uL)    Eosinophils Absolute 0.1  0.0 - 0.7 (K/uL)    Basophils Absolute 0.0  0.0 - 0.1 (K/uL)    Smear Review        Value: PLATELET CLUMPS NOTED ON SMEAR, COUNT APPEARS ADEQUATE  COMPREHENSIVE METABOLIC PANEL     Status: Abnormal   Collection Time   11/06/11  7:31 PM      Component Value Range Comment   Sodium 134 (*) 135 - 145 (mEq/L)    Potassium 4.2  3.5 - 5.1 (mEq/L)    Chloride 98  96 - 112 (mEq/L)    CO2 22  19 - 32 (mEq/L)    Glucose, Bld 189 (*) 70 - 99 (mg/dL)    BUN 18  6 - 23 (mg/dL)    Creatinine, Ser 2.13  0.50 - 1.10 (mg/dL)    Calcium 9.5  8.4 - 10.5 (mg/dL)    Total Protein 7.2  6.0 - 8.3 (g/dL)    Albumin 3.6  3.5 - 5.2 (g/dL)    AST 34  0 - 37 (U/L)    ALT 35  0 - 35 (U/L)    Alkaline Phosphatase 69  39 - 117 (U/L)    Total Bilirubin 1.0  0.3 -  1.2 (mg/dL)     GFR calc non Af Amer >90  >90 (mL/min)    GFR calc Af Amer >90  >90 (mL/min)   MAGNESIUM     Status: Normal   Collection Time   11/06/11  7:31 PM      Component Value Range Comment   Magnesium 1.5  1.5 - 2.5 (mg/dL)   APTT     Status: Normal   Collection Time   11/06/11  7:31 PM      Component Value Range Comment   aPTT 32  24 - 37 (seconds)   PROTIME-INR     Status: Normal   Collection Time   11/06/11  7:31 PM      Component Value Range Comment   Prothrombin Time 13.8  11.6 - 15.2 (seconds)    INR 1.04  0.00 - 1.49    D-DIMER, QUANTITATIVE     Status: Normal   Collection Time   11/06/11  7:31 PM      Component Value Range Comment   D-Dimer, Quant 0.26  0.00 - 0.48 (ug/mL-FEU)   PRO B NATRIURETIC PEPTIDE     Status: Abnormal   Collection Time   11/06/11  7:31 PM      Component Value Range Comment   Pro B Natriuretic peptide (BNP) 1229.0 (*) 0 - 125 (pg/mL)   POCT I-STAT TROPONIN I     Status: Normal   Collection Time   11/06/11  9:34 PM      Component Value Range Comment   Troponin i, poc 0.01  0.00 - 0.08 (ng/mL)    Comment 3             Dg Chest Port 1 View  11/06/2011  *RADIOLOGY REPORT*  Clinical Data: Chest pain and shortness of breath.  PORTABLE CHEST - 1 VIEW  Comparison: 03/03/2008  Findings: Heart size and vascularity are normal and the lungs are clear.  No osseous abnormality.  IMPRESSION:  Normal chest.  Original Report Authenticated By: Gwynn Burly, M.D.   EKG shows frequent ectopic beats; no ST wave abnormalities suggesting acuity ischemia.  Assessment Present on Admission:  .Gastroenteritis .Heart palpitations .Morbid obesity .Anxiety and depression .HTN (hypertension) .Depression  PLAN: We'll admit this lady for hydration and give her parenteral benzodiazepines since these palpitations may be related to Xanax withdrawal. Will also give a dose of beta blocker trial, if does not settle down with benzodiazepine  Other plans as per  orders.   Kanye Depree 11/07/2011, 12:07 AM

## 2011-11-07 NOTE — Progress Notes (Signed)
Subjective: The patient says that she hurts all over. She has nausea but no vomiting. She has crampy lower abdominal pain. She has had one small diarrheal stool this morning without blood or black tarriness. She has right-sided crampy-like chest pain.  Objective: Vital signs in last 24 hours: Filed Vitals:   11/07/11 0922 11/07/11 1003 11/07/11 1004 11/07/11 1005  BP:  145/63 147/65 192/67  Pulse: 108 93 95 99  Temp:  99 F (37.2 C)    TempSrc:      Resp:  20    Height:      Weight:      SpO2:  95% 96% 96%    Intake/Output Summary (Last 24 hours) at 11/07/11 1021 Last data filed at 11/07/11 0230  Gross per 24 hour  Intake    390 ml  Output      0 ml  Net    390 ml    Weight change:   Physical exam: Gen.: Morbidly obese 51 year old Caucasian woman lying in that, in no acute distress. Lungs: Clear to auscultation bilaterally. Chest wall: Mildly tender over the substernal area and pectoris major muscles bilaterally. No rash or erythema noted. Heart: S1, S2, with a couple of ectopic beats. Abdomen: Morbidly obese, positive bowel sounds, soft, mildly tender diffusely without rebound, guarding, or rigidity. Extremities: No pedal edema.  Lab Results: Basic Metabolic Panel:  Basename 11/07/11 0314 11/06/11 1931  NA 136 134*  K 3.3* 4.2  CL 101 98  CO2 23 22  GLUCOSE 177* 189*  BUN 21 18  CREATININE 0.88 0.78  CALCIUM 8.9 9.5  MG 1.5 1.5  PHOS -- --   Liver Function Tests:  [facility] 11/07/11 0314 11/06/11 1931  AST 23 34  ALT 29 35  ALKPHOS 61 69  BILITOT 0.9 1.0  PROT 6.4 7.2  ALBUMIN 3.3* 3.6   No results found for this basename: LIPASE:2,AMYLASE:2 in the last 72 hours No results found for this basename: AMMONIA:2 in the last 72 hours CBC:  Basename 11/07/11 0314 11/06/11 1931  WBC 10.1 14.9*  NEUTROABS -- 13.5*  HGB 11.6* 12.6  HCT 35.0* 38.2  MCV 85.2 85.5  PLT 241 236   Cardiac Enzymes:  Basename 11/07/11 0313  CKTOTAL 76  CKMB 1.4  CKMBINDEX  --  TROPONINI <0.30   BNP:  Basename 11/06/11 1931  PROBNP 1229.0*   D-Dimer:  Alvira Philips 11/06/11 1931  DDIMER 0.26   CBG: No results found for this basename: GLUCAP:6 in the last 72 hours Hemoglobin A1C: No results found for this basename: HGBA1C in the last 72 hours Fasting Lipid Panel: No results found for this basename: CHOL,HDL,LDLCALC,TRIG,CHOLHDL,LDLDIRECT in the last 72 hours Thyroid Function Tests: No results found for this basename: TSH,T4TOTAL,FREET4,T3FREE,THYROIDAB in the last 72 hours Anemia Panel: No results found for this basename: VITAMINB12,FOLATE,FERRITIN,TIBC,IRON,RETICCTPCT in the last 72 hours Coagulation:  Basename 11/06/11 1931  LABPROT 13.8  INR 1.04   Urine Drug Screen: Drugs of Abuse  No results found for this basename: labopia, cocainscrnur, labbenz, amphetmu, thcu, labbarb    Alcohol Level: No results found for this basename: ETH:2 in the last 72 hours Urinalysis: No results found for this basename: COLORURINE:2,APPERANCEUR:2,LABSPEC:2,PHURINE:2,GLUCOSEU:2,HGBUR:2,BILIRUBINUR:2,KETONESUR:2,PROTEINUR:2,UROBILINOGEN:2,NITRITE:2,LEUKOCYTESUR:2 in the last 72 hours Misc. Labs:   Micro: No results found for this or any previous visit (from the past 240 hour(s)).  Studies/Results: Dg Chest Port 1 View  11/06/2011  *RADIOLOGY REPORT*  Clinical Data: Chest pain and shortness of breath.  PORTABLE CHEST - 1 VIEW  Comparison: 03/03/2008  Findings: Heart size and vascularity are normal and the lungs are clear.  No osseous abnormality.  IMPRESSION:  Normal chest.  Original Report Authenticated By: Gwynn Burly, M.D.    Medications: I have reviewed the patient's current medications.  Assessment: Active Problems:  Gastroenteritis  Heart palpitations  Morbid obesity  Anxiety and depression  HTN (hypertension)  Depression  Hypokalemia  PVC's (premature ventricular contractions)   Plan:  1. We'll continue supportive treatment and IV fluid  hydration. We'll add more potassium to the IV fluids to replete her potassium level. We'll add IV Pepcid empirically. 2. Because of her borderline low magnesium level and PVCs, will give her 1 g of magnesium sulfate, although this may exacerbate her diarrhea a little. 3. We'll order a 2-D echocardiogram for evaluation. Will also add metoprolol at 12.5 mg twice a day for PVCs. 4. We'll order a TSH and free T4 for further evaluation. 5. We'll order C. difficile PCR. 6. We'll at sliding scale NovoLog and check hemoglobin A1c. 7. Urinalysis pending. Further cardiac enzymes pending.    LOS: 1 day   Jill Stopka 11/07/2011, 10:21 AM

## 2011-11-07 NOTE — Plan of Care (Signed)
Problem: Phase I Progression Outcomes Goal: OOB as tolerated unless otherwise ordered Outcome: Progressing Pt ambulates back and forth to the bathroom.

## 2011-11-07 NOTE — Progress Notes (Signed)
*  PRELIMINARY RESULTS* Echocardiogram 2D Echocardiogram has been performed.  Conrad Lakemore 11/07/2011, 3:00 PM

## 2011-11-07 NOTE — Progress Notes (Signed)
UR Chart Review Completed  

## 2011-11-08 ENCOUNTER — Encounter (HOSPITAL_COMMUNITY): Payer: Self-pay | Admitting: Internal Medicine

## 2011-11-08 DIAGNOSIS — R001 Bradycardia, unspecified: Secondary | ICD-10-CM | POA: Diagnosis present

## 2011-11-08 DIAGNOSIS — A0472 Enterocolitis due to Clostridium difficile, not specified as recurrent: Secondary | ICD-10-CM

## 2011-11-08 DIAGNOSIS — M7918 Myalgia, other site: Secondary | ICD-10-CM

## 2011-11-08 HISTORY — DX: Enterocolitis due to Clostridium difficile, not specified as recurrent: A04.72

## 2011-11-08 HISTORY — DX: Myalgia, other site: M79.18

## 2011-11-08 LAB — BASIC METABOLIC PANEL
BUN: 13 mg/dL (ref 6–23)
Chloride: 103 mEq/L (ref 96–112)
Creatinine, Ser: 0.92 mg/dL (ref 0.50–1.10)
GFR calc Af Amer: 84 mL/min — ABNORMAL LOW (ref >90)
GFR calc non Af Amer: 72 mL/min — ABNORMAL LOW (ref >90)
Glucose, Bld: 171 mg/dL — ABNORMAL HIGH (ref 70–99)
Potassium: 3.9 mEq/L (ref 3.5–5.1)

## 2011-11-08 LAB — GLUCOSE, CAPILLARY: Glucose-Capillary: 128 mg/dL — ABNORMAL HIGH (ref 70–99)

## 2011-11-08 MED ORDER — ONDANSETRON HCL 4 MG/2ML IJ SOLN
4.0000 mg | INTRAMUSCULAR | Status: DC | PRN
  Administered 2011-11-08 – 2011-11-10 (×4): 4 mg via INTRAVENOUS
  Filled 2011-11-08 (×4): qty 2

## 2011-11-08 MED ORDER — SODIUM CHLORIDE 0.9 % IJ SOLN
INTRAMUSCULAR | Status: AC
  Administered 2011-11-08: 10 mL
  Filled 2011-11-08: qty 3

## 2011-11-08 MED ORDER — HYDROCODONE-ACETAMINOPHEN 5-325 MG PO TABS
1.0000 | ORAL_TABLET | ORAL | Status: DC | PRN
  Administered 2011-11-08 – 2011-11-09 (×4): 2 via ORAL
  Administered 2011-11-09: 1 via ORAL
  Administered 2011-11-09: 2 via ORAL
  Administered 2011-11-09: 1 via ORAL
  Administered 2011-11-09 – 2011-11-10 (×4): 2 via ORAL
  Filled 2011-11-08 (×5): qty 2
  Filled 2011-11-08: qty 1
  Filled 2011-11-08 (×2): qty 2
  Filled 2011-11-08: qty 1
  Filled 2011-11-08 (×2): qty 2

## 2011-11-08 MED ORDER — LIVING WELL WITH DIABETES BOOK
Freq: Once | Status: AC
  Administered 2011-11-08: 16:00:00
  Filled 2011-11-08: qty 1

## 2011-11-08 MED ORDER — FAMOTIDINE 20 MG PO TABS
20.0000 mg | ORAL_TABLET | Freq: Every day | ORAL | Status: DC
  Administered 2011-11-08 – 2011-11-10 (×3): 20 mg via ORAL
  Filled 2011-11-08 (×3): qty 1

## 2011-11-08 MED ORDER — ENOXAPARIN SODIUM 60 MG/0.6ML ~~LOC~~ SOLN: 100.0000 mg | Freq: Every day | SUBCUTANEOUS | Status: DC

## 2011-11-08 NOTE — Progress Notes (Signed)
Subjective: The patient's chest pain has subsided. However, she complains of  Right-sided shoulder pain radiating down to her leg. She has nausea but no vomiting. She has 4 small watery stools this morning. She has mild crampy abdominal pain.  Objective: Vital signs in last 24 hours: Filed Vitals:   11/08/11 0621 11/08/11 0636 11/08/11 0637 11/08/11 1036  BP: 121/54 145/65 152/79 123/83  Pulse: 50 45 46 53  Temp: 98.4 F (36.9 C)   98.1 F (36.7 C)  TempSrc: Oral     Resp: 20 20 22 20   Height:      Weight: 216.003 kg (476 lb 3.2 oz)     SpO2: 95% 96% 97% 93%    Intake/Output Summary (Last 24 hours) at 11/08/11 1352 Last data filed at 11/08/11 0927  Gross per 24 hour  Intake   1925 ml  Output      0 ml  Net   1925 ml    Weight change: 7.348 kg (16 lb 3.2 oz)  Physical exam: Gen.: Morbidly obese 51 year old Caucasian woman lying in that, in no acute distress. Lungs: Clear to auscultation bilaterally. Chest wall: Mildly tender over the substernal area and pectoris major muscles bilaterally. No rash or erythema noted. Heart: S1, S2, with a couple of ectopic beats. Abdomen: Morbidly obese, positive bowel sounds, soft, mildly tender diffusely without rebound, guarding, or rigidity. Extremities: No pedal edema. She has mild tenderness over the right shoulder girdle. She has large proximal leg pannus bilaterally.  Lab Results: Basic Metabolic Panel:  Basename 11/08/11 0559 11/07/11 0314  NA 135 136  K 3.9 3.3*  CL 103 101  CO2 24 23  GLUCOSE 171* 177*  BUN 13 21  CREATININE 0.92 0.88  CALCIUM 8.5 8.9  MG 1.9 1.5  PHOS -- --   Liver Function Tests:  [facility] 11/07/11 0314 11/06/11 1931  AST 23 34  ALT 29 35  ALKPHOS 61 69  BILITOT 0.9 1.0  PROT 6.4 7.2  ALBUMIN 3.3* 3.6   No results found for this basename: LIPASE:2,AMYLASE:2 in the last 72 hours No results found for this basename: AMMONIA:2 in the last 72 hours CBC:  Basename 11/07/11 0314 11/06/11 1931  WBC  10.1 14.9*  NEUTROABS -- 13.5*  HGB 11.6* 12.6  HCT 35.0* 38.2  MCV 85.2 85.5  PLT 241 236   Cardiac Enzymes:  Basename 11/07/11 1912 11/07/11 1047 11/07/11 0313  CKTOTAL 165 97 76  CKMB 1.9 1.4 1.4  CKMBINDEX -- -- --  TROPONINI <0.30 <0.30 <0.30   BNP:  Basename 11/06/11 1931  PROBNP 1229.0*   D-Dimer:  Basename 11/06/11 1931  DDIMER 0.26   CBG:  Basename 11/08/11 1126 11/08/11 0738 11/07/11 2117 11/07/11 1601 11/07/11 1115  GLUCAP 104* 128* 133* 109* 138*   Hemoglobin A1C:  Basename 11/07/11 1047  HGBA1C 6.6*   Fasting Lipid Panel: No results found for this basename: CHOL,HDL,LDLCALC,TRIG,CHOLHDL,LDLDIRECT in the last 72 hours Thyroid Function Tests:  Basename 11/07/11 1047 11/07/11 0313  TSH -- 1.165  T4TOTAL -- --  FREET4 0.87 --  T3FREE -- --  THYROIDAB -- --   Anemia Panel: No results found for this basename: VITAMINB12,FOLATE,FERRITIN,TIBC,IRON,RETICCTPCT in the last 72 hours Coagulation:  Basename 11/06/11 1931  LABPROT 13.8  INR 1.04   Urine Drug Screen: Drugs of Abuse  No results found for this basename: labopia,  cocainscrnur,  labbenz,  amphetmu,  thcu,  labbarb    Alcohol Level: No results found for this basename: ETH:2 in the last  72 hours Urinalysis:  Basename 11/07/11 1807  COLORURINE YELLOW  LABSPEC 1.020  PHURINE 5.5  GLUCOSEU NEGATIVE  HGBUR TRACE*  BILIRUBINUR NEGATIVE  KETONESUR NEGATIVE  PROTEINUR NEGATIVE  UROBILINOGEN 0.2  NITRITE NEGATIVE  LEUKOCYTESUR SMALL*   Misc. Labs:   Micro: Recent Results (from the past 240 hour(s))  CLOSTRIDIUM DIFFICILE BY PCR     Status: Abnormal   Collection Time   11/07/11  1:25 PM      Component Value Range Status Comment   C difficile by pcr POSITIVE (*) NEGATIVE  Final     Studies/Results: Dg Chest Port 1 View  11/06/2011  *RADIOLOGY REPORT*  Clinical Data: Chest pain and shortness of breath.  PORTABLE CHEST - 1 VIEW  Comparison: 03/03/2008  Findings: Heart size and  vascularity are normal and the lungs are clear.  No osseous abnormality.  IMPRESSION:  Normal chest.  Original Report Authenticated By: Gwynn Burly, M.D.    Medications: I have reviewed the patient's current medications.  Assessment: Active Problems:  Gastroenteritis  Heart palpitations  Morbid obesity  Anxiety and depression  HTN (hypertension)  Depression  Hypokalemia  PVC's (premature ventricular contractions)  Chest pain, atypical  C. difficile colitis  Musculoskeletal pain  Bradycardia   Plan:  1. Flagyl was started overnight for positive C. difficile PCR. This will be continued. We'll continue to treat her nausea with as needed antiemetics. I informed her that Flagyl can cause some nausea, but we will treat this symptomatically. If the nausea is unrelenting on Flagyl, will change therapy to oral vancomycin. 2. Await the results of the 2-D echocardiogram pending. 3. We'll discontinue metoprolol due to the bradycardia. We'll continue to monitor for arrhythmias given her recent PVCs. Her thyroid function is within normal limits. 4. Will recheck her blood magnesium level in the morning, status post 1 g of magnesium sulfate yesterday. 5. Discontinue IV Pepcid in favor of oral Pepcid once daily. 6. We'll continue supportive treatment and IV fluid hydration, but will decrease the rate of IV fluids. 7. PT consultation and evaluation tomorrow.    LOS: 2 days   Yan Okray 11/08/2011, 1:52 PM

## 2011-11-08 NOTE — Progress Notes (Signed)
Glycemic Control Recommendations     Patient with history of 'borderline' diabetes.  Current HgbA1C diagnostic of Diabetes.  Will order patient education videos for patient to watch, booklet from pharmacy and RD consult.     Recommendations:    Patient may benefit from Metformin if diet/excerisice modification does not work.  Have patient follow up with PCP.

## 2011-11-08 NOTE — Progress Notes (Signed)
ANTICOAGULATION CONSULT NOTE - Initial Consult  Pharmacy Consult for Lovenox Indication: VTE prophylaxis  Allergies  Allergen Reactions  . Nitroglycerin Anxiety  . Dilaudid (Hydromorphone Hcl)   . Sulfa Antibiotics    Patient Measurements: Height: 5\' 6"  (167.6 cm) Weight: 476 lb 3.2 oz (216.003 kg) IBW/kg (Calculated) : 59.3   Vital Signs: Temp: 98.1 F (36.7 C) (04/09 1036) Temp src: Oral (04/09 0621) BP: 123/83 mmHg (04/09 1036) Pulse Rate: 53  (04/09 1036)  Labs:  Basename 11/08/11 0559 11/07/11 1912 11/07/11 1047 11/07/11 0314 11/07/11 0313 11/06/11 1931  HGB -- -- -- 11.6* -- 12.6  HCT -- -- -- 35.0* -- 38.2  PLT -- -- -- 241 -- 236  APTT -- -- -- -- -- 32  LABPROT -- -- -- -- -- 13.8  INR -- -- -- -- -- 1.04  HEPARINUNFRC -- -- -- -- -- --  CREATININE 0.92 -- -- 0.88 -- 0.78  CKTOTAL -- 165 97 -- 76 --  CKMB -- 1.9 1.4 -- 1.4 --  TROPONINI -- <0.30 <0.30 -- <0.30 --   Estimated Creatinine Clearance: 144 ml/min (by C-G formula based on Cr of 0.92).  Medical History: Past Medical History  Diagnosis Date  . Hypertension   . Depression   . Borderline diabetic   . Diverticulitis   . Asthma   . Cyst     pt has "cyst" to legs for years, areas drain at time  . Pancreatitis   . Arthritis   . C. difficile colitis 11/08/2011   Assessment: Morbidly obese female with good renal fxn Estimated Creatinine Clearance: 144 ml/min (by C-G formula based on Cr of 0.92).  Goal of Therapy:  VTE prophylaxis   Plan: Lovenox 0.5mg /Kg sq q24hrs  Valrie Hart A 11/08/2011,10:49 AM

## 2011-11-09 LAB — CBC
HCT: 34.3 % — ABNORMAL LOW (ref 36.0–46.0)
Hemoglobin: 11 g/dL — ABNORMAL LOW (ref 12.0–15.0)
MCV: 87.3 fL (ref 78.0–100.0)
RBC: 3.93 MIL/uL (ref 3.87–5.11)
WBC: 9.2 10*3/uL (ref 4.0–10.5)

## 2011-11-09 LAB — BASIC METABOLIC PANEL
CO2: 26 mEq/L (ref 19–32)
Chloride: 102 mEq/L (ref 96–112)
GFR calc Af Amer: >90 mL/min (ref >90)
Potassium: 4.7 mEq/L (ref 3.5–5.1)

## 2011-11-09 LAB — GLUCOSE, CAPILLARY
Glucose-Capillary: 103 mg/dL — ABNORMAL HIGH (ref 70–99)
Glucose-Capillary: 118 mg/dL — ABNORMAL HIGH (ref 70–99)

## 2011-11-09 MED ORDER — POTASSIUM CHLORIDE IN NACL 20-0.9 MEQ/L-% IV SOLN
INTRAVENOUS | Status: DC
  Administered 2011-11-09: 17:00:00 via INTRAVENOUS

## 2011-11-09 MED ORDER — DILTIAZEM HCL ER COATED BEADS 120 MG PO CP24
120.0000 mg | ORAL_CAPSULE | Freq: Every day | ORAL | Status: DC
  Administered 2011-11-09 – 2011-11-10 (×2): 120 mg via ORAL
  Filled 2011-11-09 (×2): qty 1

## 2011-11-09 MED ORDER — GLIMEPIRIDE 2 MG PO TABS
1.0000 mg | ORAL_TABLET | Freq: Every day | ORAL | Status: DC
  Administered 2011-11-10: 1 mg via ORAL
  Filled 2011-11-09: qty 1

## 2011-11-09 MED ORDER — ENOXAPARIN SODIUM 100 MG/ML ~~LOC~~ SOLN
100.0000 mg | Freq: Every day | SUBCUTANEOUS | Status: DC
  Administered 2011-11-09 – 2011-11-10 (×2): 100 mg via SUBCUTANEOUS
  Filled 2011-11-09 (×3): qty 1

## 2011-11-09 NOTE — Progress Notes (Signed)
Pt watched diabetic video 502 and 503. Stacey [name]

## 2011-11-09 NOTE — Progress Notes (Signed)
Subjective: She still has loose stools. She has some abdominal cramping in her lower abdomen. She has nausea but no vomiting.  Objective: Vital signs in last 24 hours: Filed Vitals:   11/09/11 0500 11/09/11 0523 11/09/11 0914 11/09/11 1025  BP:  162/63  154/73  Pulse:  86 88 84  Temp:  98 F (36.7 C)  98.5 F (36.9 C)  TempSrc:  Oral  Oral  Resp:  24 22 20   Height:      Weight: 215.776 kg (475 lb 11.2 oz)     SpO2:  94% 95% 95%    Intake/Output Summary (Last 24 hours) at 11/09/11 1056 Last data filed at 11/09/11 0600  Gross per 24 hour  Intake 1685.75 ml  Output    325 ml  Net 1360.75 ml    Weight change: -0.227 kg (-8 oz)  Physical exam: Gen.: Morbidly obese 51 year old Caucasian woman lying in that, in no acute distress. Lungs: Clear to auscultation bilaterally. Chest wall: Mildly tender over the substernal area and pectoris major muscles bilaterally. No rash or erythema noted. Heart: S1, S2, no murmurs rubs or gallops. Abdomen: Morbidly obese, positive bowel sounds, soft, mildly tender diffusely without rebound, guarding, or rigidity. Large abdominal pannus. Extremities: No pedal edema.  Lab Results: Basic Metabolic Panel:  Basename 11/09/11 0533 11/08/11 0559 11/07/11 0314  NA 137 135 --  K 4.7 3.9 --  CL 102 103 --  CO2 26 24 --  GLUCOSE 139* 171* --  BUN 10 13 --  CREATININE 0.82 0.92 --  CALCIUM 9.1 8.5 --  MG -- 1.9 1.5  PHOS -- -- --   Liver Function Tests:  [facility] 11/07/11 0314 11/06/11 1931  AST 23 34  ALT 29 35  ALKPHOS 61 69  BILITOT 0.9 1.0  PROT 6.4 7.2  ALBUMIN 3.3* 3.6   No results found for this basename: LIPASE:2,AMYLASE:2 in the last 72 hours No results found for this basename: AMMONIA:2 in the last 72 hours CBC:  Basename 11/09/11 0533 11/07/11 0314 11/06/11 1931  WBC 9.2 10.1 --  NEUTROABS -- -- 13.5*  HGB 11.0* 11.6* --  HCT 34.3* 35.0* --  MCV 87.3 85.2 --  PLT 238 241 --   Cardiac Enzymes:  Basename 11/07/11 1912  11/07/11 1047 11/07/11 0313  CKTOTAL 165 97 76  CKMB 1.9 1.4 1.4  CKMBINDEX -- -- --  TROPONINI <0.30 <0.30 <0.30   BNP:  Basename 11/06/11 1931  PROBNP 1229.0*   D-Dimer:  Basename 11/06/11 1931  DDIMER 0.26   CBG:  Basename 11/08/11 2043 11/08/11 1619 11/08/11 1126 11/08/11 0738 11/07/11 2117 11/07/11 1601  GLUCAP 102* 120* 104* 128* 133* 109*   Hemoglobin A1C:  Basename 11/07/11 1047  HGBA1C 6.6*   Fasting Lipid Panel: No results found for this basename: CHOL,HDL,LDLCALC,TRIG,CHOLHDL,LDLDIRECT in the last 72 hours Thyroid Function Tests:  Basename 11/07/11 1047 11/07/11 0313  TSH -- 1.165  T4TOTAL -- --  FREET4 0.87 --  T3FREE -- --  THYROIDAB -- --   Anemia Panel: No results found for this basename: VITAMINB12,FOLATE,FERRITIN,TIBC,IRON,RETICCTPCT in the last 72 hours Coagulation:  Basename 11/06/11 1931  LABPROT 13.8  INR 1.04   Urine Drug Screen: Drugs of Abuse  No results found for this basename: labopia,  cocainscrnur,  labbenz,  amphetmu,  thcu,  labbarb    Alcohol Level: No results found for this basename: ETH:2 in the last 72 hours Urinalysis:  Basename 11/07/11 1807  COLORURINE YELLOW  LABSPEC 1.020  PHURINE 5.5  GLUCOSEU  NEGATIVE  HGBUR TRACE*  BILIRUBINUR NEGATIVE  KETONESUR NEGATIVE  PROTEINUR NEGATIVE  UROBILINOGEN 0.2  NITRITE NEGATIVE  LEUKOCYTESUR SMALL*   Misc. Labs:   Micro: Recent Results (from the past 240 hour(s))  CLOSTRIDIUM DIFFICILE BY PCR     Status: Abnormal   Collection Time   11/07/11  1:25 PM      Component Value Range Status Comment   C difficile by pcr POSITIVE (*) NEGATIVE  Final     Studies/Results: No results found.  Medications: I have reviewed the patient's current medications.  Assessment: Active Problems:  Gastroenteritis  Heart palpitations  Morbid obesity  Anxiety and depression  HTN (hypertension)  Depression  Hypokalemia  PVC's (premature ventricular contractions)  Chest pain,  atypical  C. difficile colitis  Musculoskeletal pain  Bradycardia   Plan:  1. Continue Flagyl and supportive treatment. 2. Still awaiting the results of the 2-D echocardiogram. 3. Metoprolol was discontinued because of bradycardia. Cardizem was actually started on admission. We'll transition it to once daily dosing. 4. We'll start Amaryl in the morning in anticipation that she may go home if clinically improved. She will not require insulin as an outpatient. 5. PT consult pending.     LOS: 3 days   Aqeel Norgaard 11/09/2011, 10:56 AM
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
## 2011-11-09 NOTE — Progress Notes (Signed)
Did extensive teaching with patient, including printouts, about diabetes mellitus.  Explained to her why food choices make such a huge difference and what type of foods she should be choosing.  Gave her quite a few printouts as well regarding eye care, foot care, etc.  She will let us know if she thinks of any additional questions.    Reece Packer, RN
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
## 2011-11-09 NOTE — Progress Notes (Signed)
Received MD consult for diet education for diabetes. Noted hx of "borderline diabetes", morbid obesity, and Hgb A1c: 6.6.  Visited pt multiple times at 10:40, 13:42, and 15:51. Pt either occupied or could not arouse.  Will reattempt diet education. If diet education is not completed prior to discharge, pt may benefit from outpatient diabetes education.  Melody Haver, RD, LDN Pager: [phone]

## 2011-11-09 NOTE — Progress Notes (Signed)
Patient has watched videos 504,505, and 510. Patient verbalizes understanding of information provided in the videos. Videos 507 & 509 is not a valid video selection Stacey [name], Stacey [name]

## 2011-11-10 ENCOUNTER — Encounter (HOSPITAL_COMMUNITY): Payer: Self-pay | Admitting: Internal Medicine

## 2011-11-10 DIAGNOSIS — M792 Neuralgia and neuritis, unspecified: Secondary | ICD-10-CM

## 2011-11-10 HISTORY — DX: Neuralgia and neuritis, unspecified: M79.2

## 2011-11-10 LAB — GLUCOSE, CAPILLARY
Glucose-Capillary: 116 mg/dL — ABNORMAL HIGH (ref 70–99)
Glucose-Capillary: 105 mg/dL — ABNORMAL HIGH (ref 70–99)

## 2011-11-10 MED ORDER — HYDROCHLOROTHIAZIDE 25 MG PO TABS
25.0000 mg | ORAL_TABLET | Freq: Every day | ORAL | Status: DC
  Administered 2011-11-10: 25 mg via ORAL
  Filled 2011-11-10: qty 1

## 2011-11-10 MED ORDER — GABAPENTIN 100 MG PO CAPS: 100.0000 mg | ORAL_CAPSULE | Freq: Two times a day (BID) | ORAL | Status: DC

## 2011-11-10 MED ORDER — GLIMEPIRIDE 1 MG PO TABS: 1.0000 mg | ORAL_TABLET | Freq: Every day | ORAL | Status: DC

## 2011-11-10 MED ORDER — DILTIAZEM HCL ER COATED BEADS 120 MG PO CP24: 120.0000 mg | ORAL_CAPSULE | Freq: Every day | ORAL | Status: DC

## 2011-11-10 MED ORDER — METRONIDAZOLE 500 MG PO TABS: 500.0000 mg | ORAL_TABLET | Freq: Three times a day (TID) | ORAL | Status: AC

## 2011-11-10 MED ORDER — HYDROCODONE-ACETAMINOPHEN 5-325 MG PO TABS: 1.0000 | ORAL_TABLET | ORAL | Status: AC | PRN

## 2011-11-10 MED ORDER — FAMOTIDINE 20 MG PO TABS: 20.0000 mg | ORAL_TABLET | Freq: Every day | ORAL | Status: DC

## 2011-11-10 MED ORDER — LIVING WELL WITH DIABETES BOOK
Freq: Once | Status: DC
  Filled 2011-11-10: qty 1

## 2011-11-10 MED ORDER — GABAPENTIN 100 MG PO CAPS
100.0000 mg | ORAL_CAPSULE | Freq: Two times a day (BID) | ORAL | Status: DC
  Administered 2011-11-10 (×2): 100 mg via ORAL
  Filled 2011-11-10 (×2): qty 1

## 2011-11-10 MED ORDER — ONDANSETRON HCL 4 MG PO TABS: 4.0000 mg | ORAL_TABLET | Freq: Three times a day (TID) | ORAL | Status: AC | PRN

## 2011-11-10 NOTE — Discharge Summary (Signed)
Physician Discharge Summary  Stacey [name] MRN: [id number] DOB/AGE: May 28, 1961 51 y.o.  PCP: [facility] in [place]   Admit date: 11/06/2011 Discharge date: 11/10/2011  Discharge Diagnoses:  1. C. difficile colitis. 2. Atypical chest pain. Myocardial infarction ruled out. 3. Palpitations with PVCs. The patient's 2-D echocardiogram revealed an ejection fraction of 55-60% and mild LVH but no significant valvular abnormalities. 4. Subsequent bradycardia, thought to be secondary to a combination of beta blocker and calcium channel blocker. 5. Hypertension. 6. Chronic depression with anxiety. 7. Morbid obesity. At the time of discharge, she weighed 475 pounds. 8. Musculoskeletal pain and likely neuropathic right lower extremity pain. 9. Hypokalemia, resolved. 10. Type 2 diabetes mellitus. Her hemoglobin A1c was 6.6.    Medication List  As of 11/10/2011 12:37 PM   STOP taking these medications         ibuprofen 800 MG tablet         TAKE these medications         buPROPion 300 MG 24 hr tablet   Commonly known as: WELLBUTRIN XL   Take 300 mg by mouth daily.      diltiazem 120 MG 24 hr capsule   Commonly known as: CARDIZEM CD   Take 1 capsule (120 mg total) by mouth daily. FOR YOUR HIGH BLOOD PRESSURE AND PALPITATIONS.      escitalopram 20 MG tablet   Commonly known as: LEXAPRO   Take 20 mg by mouth daily.      famotidine 20 MG tablet   Commonly known as: PEPCID   Take 1 tablet (20 mg total) by mouth daily.      gabapentin 100 MG capsule   Commonly known as: NEURONTIN   Take 1 capsule (100 mg total) by mouth 2 (two) times daily. FOR BURNING PAIN.      glimepiride 1 MG tablet   Commonly known as: AMARYL   Take 1 tablet (1 mg total) by mouth daily with breakfast. FOR DIABETES.      hydrochlorothiazide 25 MG tablet   Commonly known as: HYDRODIURIL   Take 25 mg by mouth daily.      HYDROcodone-acetaminophen 5-325 MG per tablet   Commonly  known as: NORCO   Take 1-2 tablets by mouth every 4 (four) hours as needed.      metroNIDAZOLE 500 MG tablet   Commonly known as: FLAGYL   Take 1 tablet (500 mg total) by mouth every 8 (eight) hours. FOR C. DIFF. COLITIS.      ondansetron 4 MG tablet   Commonly known as: ZOFRAN   Take 1 tablet (4 mg total) by mouth every 8 (eight) hours as needed for nausea.            Discharge Condition: Stable and improved.  Disposition: 01-Home or Self Care   Consults: None.   Significant Diagnostic Studies: Dg Chest Port 1 View  11/06/2011  *RADIOLOGY REPORT*  Clinical Data: Chest pain and shortness of breath.  PORTABLE CHEST - 1 VIEW  Comparison: 03/03/2008  Findings: Heart size and vascularity are normal and the lungs are clear.  No osseous abnormality.  IMPRESSION:  Normal chest.  Original Report Authenticated By: Gwynn Burly, M.D.   ECHO: LV EF: 55% - 60%  ------------------------------------------------------------ Indications: Palpitations 785.1.  ------------------------------------------------------------ History: PMH: PVC's & palpitations Risk factors: Family history of coronary artery disease. Hypertension.  ------------------------------------------------------------ Study Conclusions  Left ventricle: The cavity size was normal. There was mild concentric hypertrophy.  Systolic function was normal. The estimated ejection fraction was in the range of 55% to 60%. Not all myocardial segments adequately imaged; there were no apparent regional wall motion abnormalities.  Impressions:  - Frequent PVCs present throughout the study. Transthoracic echocardiography. M-mode, complete 2D, spectral Doppler, and color Doppler. Height: Height: 167.6cm. Height: 66in. Weight: Weight: 214.1kg. Weight: 471lb. Body mass index: BMI: 76.2kg/m^2. Body surface area: BSA: 2.41m^2. Patient status: Inpatient. Location: Bedside.    Microbiology: Recent Results (from the past 240  hour(s))  CLOSTRIDIUM DIFFICILE BY PCR     Status: Abnormal   Collection Time   11/07/11  1:25 PM      Component Value Range Status Comment   C difficile by pcr POSITIVE (*) NEGATIVE  Final      Labs: Results for orders placed during the hospital encounter of 11/06/11 (from the past 48 hour(s))  GLUCOSE, CAPILLARY     Status: Abnormal   Collection Time   11/08/11  4:19 PM      Component Value Range Comment   Glucose-Capillary 120 (*) 70 - 99 (mg/dL)    Comment 1 Documented in Chart      Comment 2 Notify RN     GLUCOSE, CAPILLARY     Status: Abnormal   Collection Time   11/08/11  8:43 PM      Component Value Range Comment   Glucose-Capillary 102 (*) 70 - 99 (mg/dL)   BASIC METABOLIC PANEL     Status: Abnormal   Collection Time   11/09/11  5:33 AM      Component Value Range Comment   Sodium 137  135 - 145 (mEq/L)    Potassium 4.7  3.5 - 5.1 (mEq/L) DELTA CHECK NOTED   Chloride 102  96 - 112 (mEq/L)    CO2 26  19 - 32 (mEq/L)    Glucose, Bld 139 (*) 70 - 99 (mg/dL)    BUN 10  6 - 23 (mg/dL)    Creatinine, Ser 9.60  0.50 - 1.10 (mg/dL)    Calcium 9.1  8.4 - 10.5 (mg/dL)    GFR calc non Af Amer 83 (*) >90 (mL/min)    GFR calc Af Amer >90  >90 (mL/min)   CBC     Status: Abnormal   Collection Time   11/09/11  5:33 AM      Component Value Range Comment   WBC 9.2  4.0 - 10.5 (K/uL)    RBC 3.93  3.87 - 5.11 (MIL/uL)    Hemoglobin 11.0 (*) 12.0 - 15.0 (g/dL)    HCT 45.4 (*) 09.8 - 46.0 (%)    MCV 87.3  78.0 - 100.0 (fL)    MCH 28.0  26.0 - 34.0 (pg)    MCHC 32.1  30.0 - 36.0 (g/dL)    RDW 11.9  14.7 - 82.9 (%)    Platelets 238  150 - 400 (K/uL)   GLUCOSE, CAPILLARY     Status: Abnormal   Collection Time   11/09/11  7:47 AM      Component Value Range Comment   Glucose-Capillary 127 (*) 70 - 99 (mg/dL)   GLUCOSE, CAPILLARY     Status: Abnormal   Collection Time   11/09/11 12:06 PM      Component Value Range Comment   Glucose-Capillary 137 (*) 70 - 99 (mg/dL)   GLUCOSE, CAPILLARY      Status: Abnormal   Collection Time   11/09/11  4:57 PM      Component Value  Range Comment   Glucose-Capillary 103 (*) 70 - 99 (mg/dL)   GLUCOSE, CAPILLARY     Status: Abnormal   Collection Time   11/09/11  9:15 PM      Component Value Range Comment   Glucose-Capillary 118 (*) 70 - 99 (mg/dL)   GLUCOSE, CAPILLARY     Status: Abnormal   Collection Time   11/10/11  7:37 AM      Component Value Range Comment   Glucose-Capillary 116 (*) 70 - 99 (mg/dL)   GLUCOSE, CAPILLARY     Status: Abnormal   Collection Time   11/10/11 12:09 PM      Component Value Range Comment   Glucose-Capillary 105 (*) 70 - 99 (mg/dL)      HPI : The patient is a 51 year old woman with a history significant for hypertension, diabetes mellitus, and morbid obesity. She presented to the emergency department on November 06, 2011 with a chief complaint of chest discomfort and palpitations. She also complained of nausea, vomiting, and diarrhea. In the emergency department, she was noted to be afebrile and mildly tachycardic, otherwise hemodynamically stable. Her lab data were significant for a WBC of 14.9, glucose of 189, normal LFTs, pro BNP of 1229, normal d-dimer, and normal troponin I. Her chest x-ray revealed no acute cardiopulmonary abnormalities. She was admitted for further evaluation and management.  HOSPITAL COURSE: The patient was started on IV hydration with IV fluids. Antiemetics and analgesics were ordered as needed. IV Pepcid was started empirically. Diltiazem was started empirically for palpitations and PVCs seen on the EKG. Subsequently, the metoprolol was started inadvertently. The patient developed asymptomatic bradycardia and therefore, metoprolol was discontinued. She was maintained on Cardizem not only for PVCs but also for hypertension. Hydrochlorothiazide was withheld during the time that the patient is being hydrated. She was maintained on her antidepressant. She was given as needed lorazepam for anxiousness.  Sliding-scale NovoLog was added for treatment of diabetes mellitus. The patient had not yet started pharmacological treatment as she was told that she had borderline diabetes. Her hemoglobin A1c was noted to be 6.6. She was subsequently started on Amaryl 1 mg daily. She received diabetes education and teaching by the registered dietitian and registered nurse during hospitalization.  For further evaluation, a number of studies were ordered. All of her cardiac enzymes were well within normal limits, and therefore, she ruled out for myocardial infarction. Both her TSH and free T4 within normal limits. Her 2-D echocardiogram revealed preserved LV function with mild LVH but no significant valvular abnormalities. Her C. difficile PCR became positive. She was subsequently started on oral Flagyl.  The patient complained of diffuse musculoskeletal pain and specifically a burning type pain that started at her right lower back and radiated to her right leg. She is morbidly obese and it is likely that she has degenerative changes in her spine and her knees from her massive weight. Nevertheless, she was treated with analgesics as needed. Neurontin was also started for likely neuropathic pain. The physical therapist evaluated her and recommended a bariatric walker and a hospital bed. Both were ordered. The patient requested a wheelchair but I denied this request in that I wanted her to become more ambulatory if possible and not rely on the wheelchair only. She was also instructed to discuss bariatric surgery and other means of weight loss with her primary care physician. And, if her primary care physician wanted to order the wheelchair, he or she could.  The patient's diarrhea subsided. Her nausea  and vomiting resolved. She received 3 days of Flagyl during the hospitalization. She was discharged to home on 7 more days of therapy. Hydrochlorothiazide was restarted prior to discharge because of her increasing blood  pressures.    Discharge Exam: Blood pressure 188/110, pulse 110, temperature 98.5 F (36.9 C), temperature source Oral, resp. rate 20, height 5\' 6"  (1.676 m), weight 215.776 kg (475 lb 11.2 oz), last menstrual period 10/06/2011, SpO2 93.00%.  Lungs: Clear to auscultation bilaterally. Heart: S1, S2, with no murmurs rubs or gallops. Abdomen: Morbidly obese, positive bowel sounds, soft, nontender, nondistended. Extremities: No pedal edema. Mildly tender over the knee and thigh on the right. No erythema or masses palpated.   Discharge Orders    Future Orders Please Complete By Expires   Diet Carb Modified      Diet - low sodium heart healthy      Increase activity slowly      Discharge instructions      Comments:   Follow up with your doctor to discuss treatment for weight loss and chronic pain. Do not take you diabetes medication if your blood sugar is below 110.      Follow-up Information    Please follow up. (FOLLOW UP WITH YOUR DOCTOR IN [place] IN 1 WEEK CALL FOR THE APPOINTMENT.)          Total discharge time: 35 minutes.   Signed: Carlicia Leavens 11/10/2011, 12:37 PM
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
## 2011-11-10 NOTE — Progress Notes (Signed)
D/C instructions reviewed with patient.  Verbalized understanding.  Pt dc'd to home with family.  Schonewitz, Candelaria Stagers 11/10/2011

## 2011-11-10 NOTE — Progress Notes (Signed)
Patient's pain has not been well controlled on the Norco 5/325, 2 tabs every hours.  She stated she had a sciatic nerve problem so I paged Dr. Rito Ehrlich and asked about Neurontin.  He prescribed 100 mg 2 times daily.  The patient said that this helped with the pain more than the Norco alone.  She is asking if the dosage of Neurontin could be increased and whether she would be able to get this to take at home.  The pain is a burning type pain going from her low back down her right leg and into her foot.  I told her I would put in a note about this and will pass on this information to the oncoming nurse as well.

## 2011-11-24 ENCOUNTER — Other Ambulatory Visit: Payer: Self-pay | Admitting: Internal Medicine

## 2012-06-20 ENCOUNTER — Encounter (HOSPITAL_COMMUNITY): Payer: Self-pay | Admitting: *Deleted

## 2012-06-20 ENCOUNTER — Emergency Department (HOSPITAL_COMMUNITY): Payer: Medicaid Other

## 2012-06-20 ENCOUNTER — Inpatient Hospital Stay (HOSPITAL_COMMUNITY)
Admission: EM | Admit: 2012-06-20 | Discharge: 2012-06-23 | DRG: 392 | Disposition: A | Discharge status: Home / Self Care | Payer: Medicaid Other | Attending: Internal Medicine | Admitting: Internal Medicine

## 2012-06-20 DIAGNOSIS — R002 Palpitations: Secondary | ICD-10-CM

## 2012-06-20 DIAGNOSIS — R001 Bradycardia, unspecified: Secondary | ICD-10-CM

## 2012-06-20 DIAGNOSIS — R1031 Right lower quadrant pain: Secondary | ICD-10-CM

## 2012-06-20 DIAGNOSIS — Z6841 Body Mass Index (BMI) 40.0 and over, adult: Secondary | ICD-10-CM

## 2012-06-20 DIAGNOSIS — E119 Type 2 diabetes mellitus without complications: Secondary | ICD-10-CM | POA: Diagnosis present

## 2012-06-20 DIAGNOSIS — F411 Generalized anxiety disorder: Secondary | ICD-10-CM | POA: Diagnosis present

## 2012-06-20 DIAGNOSIS — A0472 Enterocolitis due to Clostridium difficile, not specified as recurrent: Secondary | ICD-10-CM

## 2012-06-20 DIAGNOSIS — M792 Neuralgia and neuritis, unspecified: Secondary | ICD-10-CM

## 2012-06-20 DIAGNOSIS — M7918 Myalgia, other site: Secondary | ICD-10-CM

## 2012-06-20 DIAGNOSIS — F32A Depression, unspecified: Secondary | ICD-10-CM

## 2012-06-20 DIAGNOSIS — I493 Ventricular premature depolarization: Secondary | ICD-10-CM

## 2012-06-20 DIAGNOSIS — K5792 Diverticulitis of intestine, part unspecified, without perforation or abscess without bleeding: Secondary | ICD-10-CM

## 2012-06-20 DIAGNOSIS — F3289 Other specified depressive episodes: Secondary | ICD-10-CM | POA: Diagnosis present

## 2012-06-20 DIAGNOSIS — F419 Anxiety disorder, unspecified: Secondary | ICD-10-CM

## 2012-06-20 DIAGNOSIS — F329 Major depressive disorder, single episode, unspecified: Secondary | ICD-10-CM

## 2012-06-20 DIAGNOSIS — J45909 Unspecified asthma, uncomplicated: Secondary | ICD-10-CM | POA: Diagnosis present

## 2012-06-20 DIAGNOSIS — I10 Essential (primary) hypertension: Secondary | ICD-10-CM

## 2012-06-20 DIAGNOSIS — Z79899 Other long term (current) drug therapy: Secondary | ICD-10-CM

## 2012-06-20 DIAGNOSIS — K5732 Diverticulitis of large intestine without perforation or abscess without bleeding: Principal | ICD-10-CM | POA: Diagnosis present

## 2012-06-20 DIAGNOSIS — F341 Dysthymic disorder: Secondary | ICD-10-CM

## 2012-06-20 DIAGNOSIS — K529 Noninfective gastroenteritis and colitis, unspecified: Secondary | ICD-10-CM

## 2012-06-20 DIAGNOSIS — M129 Arthropathy, unspecified: Secondary | ICD-10-CM | POA: Diagnosis present

## 2012-06-20 DIAGNOSIS — R0789 Other chest pain: Secondary | ICD-10-CM

## 2012-06-20 DIAGNOSIS — E876 Hypokalemia: Secondary | ICD-10-CM

## 2012-06-20 DIAGNOSIS — D72829 Elevated white blood cell count, unspecified: Secondary | ICD-10-CM | POA: Diagnosis present

## 2012-06-20 LAB — CBC WITH DIFFERENTIAL/PLATELET
Basophils Absolute: 0 10*3/uL (ref 0.0–0.1)
Eosinophils Relative: 2 % (ref 0–5)
HCT: 33.8 % — ABNORMAL LOW (ref 36.0–46.0)
Lymphocytes Relative: 12 % (ref 12–46)
Lymphs Abs: 1.5 10*3/uL (ref 0.7–4.0)
MCV: 86.7 fL (ref 78.0–100.0)
Neutro Abs: 9.4 10*3/uL — ABNORMAL HIGH (ref 1.7–7.7)
Platelets: 282 10*3/uL (ref 150–400)
RBC: 3.9 MIL/uL (ref 3.87–5.11)
RDW: 13.5 % (ref 11.5–15.5)
WBC: 11.9 10*3/uL — ABNORMAL HIGH (ref 4.0–10.5)

## 2012-06-20 LAB — URINALYSIS, ROUTINE W REFLEX MICROSCOPIC
Nitrite: NEGATIVE
Protein, ur: 30 mg/dL — AB
Urobilinogen, UA: 0.2 mg/dL (ref 0.0–1.0)

## 2012-06-20 LAB — COMPREHENSIVE METABOLIC PANEL
ALT: 41 U/L — ABNORMAL HIGH (ref 0–35)
AST: 38 U/L — ABNORMAL HIGH (ref 0–37)
Alkaline Phosphatase: 71 U/L (ref 39–117)
CO2: 26 mEq/L (ref 19–32)
Calcium: 9.8 mg/dL (ref 8.4–10.5)
Chloride: 103 mEq/L (ref 96–112)
GFR calc Af Amer: >90 mL/min (ref >90)
GFR calc non Af Amer: 86 mL/min — ABNORMAL LOW (ref >90)
Glucose, Bld: 131 mg/dL — ABNORMAL HIGH (ref 70–99)
Sodium: 140 mEq/L (ref 135–145)
Total Bilirubin: 1 mg/dL (ref 0.3–1.2)

## 2012-06-20 LAB — PREGNANCY, URINE: Preg Test, Ur: NEGATIVE

## 2012-06-20 LAB — URINE MICROSCOPIC-ADD ON

## 2012-06-20 MED ORDER — ACETAMINOPHEN 325 MG PO TABS
650.0000 mg | ORAL_TABLET | Freq: Four times a day (QID) | ORAL | Status: DC | PRN
  Administered 2012-06-21 – 2012-06-23 (×4): 650 mg via ORAL
  Filled 2012-06-20 (×5): qty 2

## 2012-06-20 MED ORDER — HEPARIN SODIUM (PORCINE) 5000 UNIT/ML IJ SOLN
5000.0000 [IU] | Freq: Three times a day (TID) | INTRAMUSCULAR | Status: DC
  Administered 2012-06-20 – 2012-06-23 (×7): 5000 [IU] via SUBCUTANEOUS
  Filled 2012-06-20 (×7): qty 1

## 2012-06-20 MED ORDER — ONDANSETRON HCL 4 MG/2ML IJ SOLN
4.0000 mg | Freq: Once | INTRAMUSCULAR | Status: AC
  Administered 2012-06-20: 4 mg via INTRAVENOUS
  Filled 2012-06-20: qty 2

## 2012-06-20 MED ORDER — BUPROPION HCL ER (XL) 300 MG PO TB24
300.0000 mg | ORAL_TABLET | Freq: Every day | ORAL | Status: DC
  Administered 2012-06-21 – 2012-06-23 (×3): 300 mg via ORAL
  Filled 2012-06-20 (×6): qty 1

## 2012-06-20 MED ORDER — SODIUM CHLORIDE 0.9 % IV SOLN
INTRAVENOUS | Status: DC
  Administered 2012-06-20 – 2012-06-22 (×4): via INTRAVENOUS
  Administered 2012-06-22: 20 mL/h via INTRAVENOUS

## 2012-06-20 MED ORDER — SODIUM CHLORIDE 0.9 % IV BOLUS (SEPSIS)
1000.0000 mL | Freq: Once | INTRAVENOUS | Status: AC
  Administered 2012-06-20: 1000 mL via INTRAVENOUS

## 2012-06-20 MED ORDER — LORAZEPAM 1 MG PO TABS
1.0000 mg | ORAL_TABLET | Freq: Three times a day (TID) | ORAL | Status: DC
  Administered 2012-06-21: 1 mg via ORAL
  Filled 2012-06-20: qty 1

## 2012-06-20 MED ORDER — ALUM & MAG HYDROXIDE-SIMETH 200-200-20 MG/5ML PO SUSP: 30.0000 mL | Freq: Four times a day (QID) | ORAL | Status: DC | PRN

## 2012-06-20 MED ORDER — ESCITALOPRAM OXALATE 10 MG PO TABS
20.0000 mg | ORAL_TABLET | Freq: Every day | ORAL | Status: DC
  Administered 2012-06-21 – 2012-06-23 (×3): 20 mg via ORAL
  Filled 2012-06-20: qty 1
  Filled 2012-06-20 (×2): qty 2

## 2012-06-20 MED ORDER — ONDANSETRON HCL 4 MG PO TABS: 4.0000 mg | ORAL_TABLET | Freq: Four times a day (QID) | ORAL | Status: DC | PRN

## 2012-06-20 MED ORDER — HYDROCODONE-ACETAMINOPHEN 5-325 MG PO TABS
1.0000 | ORAL_TABLET | Freq: Four times a day (QID) | ORAL | Status: DC | PRN
  Administered 2012-06-20: 2 via ORAL
  Administered 2012-06-21: 1 via ORAL
  Administered 2012-06-21: 2 via ORAL
  Filled 2012-06-20 (×3): qty 2

## 2012-06-20 MED ORDER — MORPHINE SULFATE 2 MG/ML IJ SOLN
2.0000 mg | INTRAMUSCULAR | Status: DC | PRN
  Filled 2012-06-20: qty 1

## 2012-06-20 MED ORDER — ALPRAZOLAM 1 MG PO TABS: 1.0000 mg | ORAL_TABLET | Freq: Three times a day (TID) | ORAL | Status: DC

## 2012-06-20 MED ORDER — FENTANYL CITRATE 0.05 MG/ML IJ SOLN
50.0000 ug | Freq: Once | INTRAMUSCULAR | Status: AC
  Administered 2012-06-20: 50 ug via INTRAVENOUS
  Filled 2012-06-20: qty 2

## 2012-06-20 MED ORDER — FENTANYL CITRATE 0.05 MG/ML IJ SOLN
100.0000 ug | Freq: Once | INTRAMUSCULAR | Status: AC
  Administered 2012-06-20: 75 ug via INTRAVENOUS
  Filled 2012-06-20: qty 2

## 2012-06-20 MED ORDER — CIPROFLOXACIN IN D5W 400 MG/200ML IV SOLN
400.0000 mg | Freq: Two times a day (BID) | INTRAVENOUS | Status: DC
  Administered 2012-06-20 – 2012-06-21 (×2): 400 mg via INTRAVENOUS
  Filled 2012-06-20 (×2): qty 200

## 2012-06-20 MED ORDER — METRONIDAZOLE IN NACL 5-0.79 MG/ML-% IV SOLN
500.0000 mg | Freq: Three times a day (TID) | INTRAVENOUS | Status: DC
  Administered 2012-06-20 – 2012-06-21 (×3): 500 mg via INTRAVENOUS
  Filled 2012-06-20 (×3): qty 100

## 2012-06-20 MED ORDER — TRAZODONE HCL 50 MG PO TABS: 50.0000 mg | ORAL_TABLET | Freq: Every evening | ORAL | Status: DC | PRN

## 2012-06-20 MED ORDER — ONDANSETRON HCL 4 MG/2ML IJ SOLN
4.0000 mg | Freq: Four times a day (QID) | INTRAMUSCULAR | Status: DC | PRN
  Administered 2012-06-22: 4 mg via INTRAVENOUS
  Filled 2012-06-20: qty 2

## 2012-06-20 MED ORDER — BUPROPION HCL ER (SR) 150 MG PO TB12
ORAL_TABLET | ORAL | Status: AC
  Filled 2012-06-20: qty 2

## 2012-06-20 MED ORDER — BUPROPION HCL ER (XL) 150 MG PO TB24
ORAL_TABLET | ORAL | Status: AC
  Filled 2012-06-20: qty 2

## 2012-06-20 MED ORDER — ACETAMINOPHEN 650 MG RE SUPP: 650.0000 mg | Freq: Four times a day (QID) | RECTAL | Status: DC | PRN

## 2012-06-20 NOTE — ED Notes (Signed)
Patient wanted something to eat. Clear liquid diet ordered. Patient stated she does not want a Bariatric bed when admitted. RN made aware.

## 2012-06-20 NOTE — ED Provider Notes (Signed)
History   This chart was scribed for Donnetta Hutching, MD by Thad Ranger, ED Scribe. This patient was seen in room APA18/[facility]18 and the patient's care was started at 12:06 PM.   CSN: [id number]  Arrival date & time 06/20/12  1150   None     Chief Complaint  Patient presents with  . Abdominal Pain   The history is provided by the patient. No language interpreter was used.    Stacey [name] is a 51 y.o. female with a history of Diverticulitis who presents to the Emergency Department complaining of gradual onset, gradually worsening, severe lower mid abdominal pain onset yesterday around 7 PM. Pain radiates to lower back. Pain is aggravated by movement and certain positions. She says that she took some OTC medication for pain with some relief. There is associated nausea and loss of appetite today. She denies vomiting,diarrhea, and urinary problems. Patient reports that her symptoms are similar to previous episodes of diverticulitis.   Past Medical History  Diagnosis Date  . Hypertension   . Depression   . Type 2 diabetes mellitus   . Diverticulitis   . Asthma   . Cyst     pt has "cyst" to legs for years, areas drain at time  . Pancreatitis   . Arthritis   . C. difficile colitis 11/08/2011  . Musculoskeletal pain 11/08/2011  . Neuropathic pain 11/10/2011    Past Surgical History  Procedure Date  . Tonsillectomy   . Cholecystectomy   . Cesarean section   . Nose surgery     History reviewed. No pertinent family history.  History  Substance Use Topics  . Smoking status: Never Smoker   . Smokeless tobacco: Never Used  . Alcohol Use: No    No OB history provided.  Review of Systems  A complete 10 system review of systems was obtained and all systems are negative except as noted in the HPI and PMH.    Allergies  Nitroglycerin; Dilaudid; and Sulfa antibiotics  Home Medications   Current Outpatient Rx  Name  Route  Sig  Dispense  Refill  . BUPROPION HCL ER (XL)  300 MG PO TB24   Oral   Take 300 mg by mouth daily.           Marland Kitchen DILTIAZEM HCL ER COATED BEADS 120 MG PO CP24   Oral   Take 1 capsule (120 mg total) by mouth daily. FOR YOUR HIGH BLOOD PRESSURE AND PALPITATIONS.   30 capsule   2   . ESCITALOPRAM OXALATE 20 MG PO TABS   Oral   Take 20 mg by mouth daily.           Marland Kitchen FAMOTIDINE 20 MG PO TABS   Oral   Take 1 tablet (20 mg total) by mouth daily.   30 tablet   2   . GABAPENTIN 100 MG PO CAPS   Oral   Take 1 capsule (100 mg total) by mouth 2 (two) times daily. FOR BURNING PAIN.   60 capsule   2   . GLIMEPIRIDE 1 MG PO TABS   Oral   Take 1 tablet (1 mg total) by mouth daily with breakfast. FOR DIABETES.   30 tablet   2   . HYDROCHLOROTHIAZIDE 25 MG PO TABS   Oral   Take 25 mg by mouth daily.           BP 129/56  Pulse 87  Temp 98.1 F (36.7 C) (Oral)  Resp  20  Ht 5\' 6"  (1.676 m)  Wt 460 lb (208.655 kg)  BMI 74.25 kg/m2  SpO2 99%  LMP 06/06/2012  Physical Exam  Nursing note and vitals reviewed. Constitutional: She is oriented to person, place, and time. She appears well-developed and well-nourished.       Morbidly obese.  HENT:  Head: Normocephalic and atraumatic.  Eyes: Conjunctivae normal and EOM are normal. Pupils are equal, round, and reactive to light.  Neck: Normal range of motion. Neck supple.  Cardiovascular: Normal rate, regular rhythm and normal heart sounds.   Pulmonary/Chest: Effort normal and breath sounds normal.  Abdominal: Soft. Bowel sounds are normal.       Tenderness to RLQ and suprapubic area.  Musculoskeletal: Normal range of motion.  Neurological: She is alert and oriented to person, place, and time.  Skin: Skin is warm and dry.  Psychiatric: She has a normal mood and affect.    ED Course  Procedures (including critical care time)  DIAGNOSTIC STUDIES: Oxygen Saturation is 99% on room air, normal by my interpretation.    COORDINATION OF CARE: 12:29 PM Discussed treatment plan  which includes pain medications,ad  X-rays  with pt at bedside and pt agreed to plan.  Labs Reviewed  CBC WITH DIFFERENTIAL - Abnormal; Notable for the following:    WBC 11.9 (*)     Hemoglobin 11.1 (*)     HCT 33.8 (*)     Neutrophils Relative 79 (*)     Neutro Abs 9.4 (*)     All other components within normal limits  COMPREHENSIVE METABOLIC PANEL - Abnormal; Notable for the following:    Glucose, Bld 131 (*)     AST 38 (*)     ALT 41 (*)     GFR calc non Af Amer 86 (*)     All other components within normal limits  URINALYSIS, ROUTINE W REFLEX MICROSCOPIC - Abnormal; Notable for the following:    Specific Gravity, Urine >1.030 (*)     Hgb urine dipstick TRACE (*)     Bilirubin Urine SMALL (*)     Ketones, ur TRACE (*)     Protein, ur 30 (*)     All other components within normal limits  URINE MICROSCOPIC-ADD ON - Abnormal; Notable for the following:    Squamous Epithelial / LPF MANY (*)     Bacteria, UA MANY (*)     All other components within normal limits  LIPASE, BLOOD  PREGNANCY, URINE  URINE CULTURE   No results found.   No diagnosis found.    MDM  Patient is morbidly obese.  Physical exam shows a nonacute abdomen, most tender in right lower quadrant and suprapubic area.  White count 11.9, AST and ALT minimally elevated; urinalysis shows no infection.   Discussed with hospitalist Dr. Lendell Caprice.  She will evaluate patient.      I personally performed the services described in this documentation, which was scribed in my presence. The recorded information has been reviewed and is accurate.    Donnetta Hutching, MD 06/20/12 [phone]

## 2012-06-20 NOTE — ED Notes (Signed)
Dr. Sullivan at bedside to examine.   

## 2012-06-20 NOTE — H&P (Signed)
Hospital Admission Note Date: 06/20/2012  Patient name: Stacey [name] Medical record number: [id number] Date of birth: February 18, 1961 Age: 51 y.o. Gender: female PCP: No primary provider on file.  Attending physician: Christiane Ha, MD  Chief Complaint: Right lower quadrant Pain  History of Present Illness:  Stacey [name] is an 51 y.o. female , morbidly obese who presents with right lower quadrant pain. She reports it feels similar to her previous episodes of diverticulitis. She feels nauseated but has had no vomiting. No diarrhea. She's had night sweats and chills. No documented fevers. Her appetite has been poor. Her symptoms started yesterday. She exceeds the weight limit for the CAT scanner. She has had C. difficile in the past and her symptoms are not similar. She has a history also of pancreatitis and has had a cholecystectomy. Her white blood cell count in the emergency room is 11,000. She is not febrile. She is asking for something to drink. The pain is severe. She tried taking some pain medication at home which helped slightly.  Past Medical History  Diagnosis Date  . Hypertension   . Depression   . Type 2 diabetes mellitus   . Diverticulitis   . Asthma   . Cyst     pt has "cyst" to legs for years, areas drain at time  . Pancreatitis   . Arthritis   . C. difficile colitis 11/08/2011  . Musculoskeletal pain 11/08/2011  . Neuropathic pain 11/10/2011    Meds: Prescriptions prior to admission  Medication Sig Dispense Refill  . ALPRAZolam (XANAX) 1 MG tablet Take 1 mg by mouth 3 (three) times daily. Patient states she only takes once daily most of the time      . B Complex-C (B-COMPLEX WITH VITAMIN C) tablet Take 1 tablet by mouth daily.      Marland Kitchen buPROPion (WELLBUTRIN XL) 300 MG 24 hr tablet Take 300 mg by mouth daily.        . cholecalciferol (VITAMIN D) 1000 UNITS tablet Take 1,000 Units by mouth daily.      Marland Kitchen escitalopram (LEXAPRO) 20 MG tablet Take 20 mg by mouth daily.         . fish oil-omega-3 fatty acids 1000 MG capsule Take 1 g by mouth daily.      Marland Kitchen gabapentin (NEURONTIN) 100 MG capsule Take 1 capsule (100 mg total) by mouth 2 (two) times daily. FOR BURNING PAIN.  60 capsule  2  . hydrochlorothiazide (HYDRODIURIL) 25 MG tablet Take 25 mg by mouth daily.      Marland Kitchen ibuprofen (ADVIL,MOTRIN) 200 MG tablet Take 800 mg by mouth every 8 (eight) hours as needed. pain      . Turmeric 500 MG CAPS Take 1 capsule by mouth daily.        Allergies: Nitroglycerin; Dilaudid; and Sulfa antibiotics History   Social History  . Marital Status: Divorced    Spouse Name: N/A    Number of Children: N/A  . Years of Education: N/A   Occupational History  . Not on file.   Social History Main Topics  . Smoking status: Never Smoker   . Smokeless tobacco: Never Used  . Alcohol Use: No  . Drug Use: No  . Sexually Active: Not Currently   Other Topics Concern  . Not on file   Social History Narrative  . No narrative on file   Family history: no cancer  Past Surgical History  Procedure Date  . Tonsillectomy   . Cholecystectomy   .  Cesarean section   . Nose surgery     Review of Systems: Systems reviewed and as per HPI, otherwise negative.  Physical Exam: Blood pressure 117/48, pulse 75, temperature 98.1 F (36.7 C), temperature source Oral, resp. rate 18, height 5\' 6"  (1.676 m), weight 208.655 kg (460 lb), last menstrual period 06/06/2012, SpO2 100.00%. BP 132/66  Pulse 88  Temp 98.1 F (36.7 C) (Oral)  Resp 20  Ht 5\' 6"  (1.676 m)  Wt 204.9 kg (451 lb 11.6 oz)  BMI 72.91 kg/m2  SpO2 97%  LMP 06/06/2012  General Appearance:    Alert, cooperative, no distress, appears stated age, extreme morbid obesity  Head:    Normocephalic, without obvious abnormality, atraumatic  Eyes:    PERRL, conjunctiva/corneas clear, EOM's intact, fundi    benign, both eyes  Ears:    Normal TM's and external ear canals, both ears  Nose:   Nares normal, septum midline, mucosa  normal, no drainage    or sinus tenderness  Throat:   Lips, mucosa, and tongue normal; teeth and gums normal  Neck:   Supple, symmetrical, trachea midline, no adenopathy;    thyroid:  no enlargement/tenderness/nodules; no carotid   bruit or JVD  Back:     Symmetric, no curvature, ROM normal, no CVA tenderness  Lungs:     Clear to auscultation bilaterally, respirations unlabored  Chest Wall:    No tenderness or deformity   Heart:    Regular rate and rhythm, S1 and S2 normal, no murmur, rub   or gallop     Abdomen:     Massively obese. right lower quadrant and slight right upper quadrant tenderness. No cellulitis noted about the pannus or abdominal wall.   Genitalia:   deferred  Rectal:   deferred  Extremities:   Extremities normal, atraumatic, no cyanosis or edema  Pulses:   2+ and symmetric all extremities  Skin:   Skin color, texture, turgor normal, no rashes or lesions  Lymph nodes:   Cervical, supraclavicular, and axillary nodes normal  Neurologic:   CNII-XII intact, normal strength, sensation and reflexes    throughout    Psychiatric:  Normal affect   Lab results: Basic Metabolic Panel:  Basename 06/20/12 1240  NA 140  K 3.9  CL 103  CO2 26  GLUCOSE 131*  BUN 17  CREATININE 0.80  CALCIUM 9.8  MG --  PHOS --   Liver Function Tests:  Basename 06/20/12 1240  AST 38*  ALT 41*  ALKPHOS 71  BILITOT 1.0  PROT 7.0  ALBUMIN 3.6    Basename 06/20/12 1240  LIPASE 23  AMYLASE --   No results found for this basename: AMMONIA:2 in the last 72 hours CBC:  Basename 06/20/12 1240  WBC 11.9*  NEUTROABS 9.4*  HGB 11.1*  HCT 33.8*  MCV 86.7  PLT 282    Basename 06/20/12 1248  COLORURINE YELLOW  LABSPEC >1.030*  PHURINE 6.0  GLUCOSEU NEGATIVE  HGBUR TRACE*  BILIRUBINUR SMALL*  KETONESUR TRACE*  PROTEINUR 30*  UROBILINOGEN 0.2  NITRITE NEGATIVE  LEUKOCYTESUR NEGATIVE    Imaging results:  No results found.  Assessment & Plan:  presumed diverticulitis:  Will monitor overnight.  Clear liquids. Empiric antibiotics. Pain and nausea medications.  HTN (hypertension)  Morbid obesity  Anxiety and depression  Yizel Canby L 06/20/2012, 5:24 PM

## 2012-06-20 NOTE — ED Notes (Signed)
abd pain since yesterday, Nausea, no vomiting,  No diarrhea, Pt thinks it is flare of diverticulitis.

## 2012-06-21 ENCOUNTER — Observation Stay (HOSPITAL_COMMUNITY): Payer: Medicaid Other

## 2012-06-21 ENCOUNTER — Inpatient Hospital Stay (HOSPITAL_COMMUNITY): Payer: Medicaid Other

## 2012-06-21 LAB — COMPREHENSIVE METABOLIC PANEL
AST: 37 U/L (ref 0–37)
Albumin: 3 g/dL — ABNORMAL LOW (ref 3.5–5.2)
Alkaline Phosphatase: 69 U/L (ref 39–117)
BUN: 12 mg/dL (ref 6–23)
Potassium: 3.6 mEq/L (ref 3.5–5.1)
Sodium: 140 mEq/L (ref 135–145)
Total Protein: 6.5 g/dL (ref 6.0–8.3)

## 2012-06-21 LAB — CBC WITH DIFFERENTIAL/PLATELET
Basophils Absolute: 0 10*3/uL (ref 0.0–0.1)
Eosinophils Relative: 3 % (ref 0–5)
HCT: 32.7 % — ABNORMAL LOW (ref 36.0–46.0)
Lymphocytes Relative: 26 % (ref 12–46)
Lymphs Abs: 1.9 10*3/uL (ref 0.7–4.0)
MCV: 88.1 fL (ref 78.0–100.0)
Monocytes Absolute: 0.7 10*3/uL (ref 0.1–1.0)
RDW: 13.5 % (ref 11.5–15.5)
WBC: 7.2 10*3/uL (ref 4.0–10.5)

## 2012-06-21 MED ORDER — KETOROLAC TROMETHAMINE 30 MG/ML IJ SOLN
30.0000 mg | Freq: Three times a day (TID) | INTRAMUSCULAR | Status: DC | PRN
  Administered 2012-06-22 – 2012-06-23 (×3): 30 mg via INTRAVENOUS
  Filled 2012-06-21 (×3): qty 1

## 2012-06-21 MED ORDER — LORAZEPAM 2 MG/ML IJ SOLN
0.5000 mg | Freq: Three times a day (TID) | INTRAMUSCULAR | Status: DC
  Administered 2012-06-21 – 2012-06-23 (×5): 0.5 mg via INTRAVENOUS
  Filled 2012-06-21 (×5): qty 1

## 2012-06-21 MED ORDER — MEPERIDINE HCL 100 MG/ML IJ SOLN
INTRAMUSCULAR | Status: AC
  Filled 2012-06-21: qty 2

## 2012-06-21 MED ORDER — MIDAZOLAM HCL 5 MG/5ML IJ SOLN
INTRAMUSCULAR | Status: AC
  Filled 2012-06-21: qty 10

## 2012-06-21 NOTE — Progress Notes (Signed)
UR Chart Review Completed  

## 2012-06-21 NOTE — Progress Notes (Signed)
Nutrition Education Note RD consulted for nutrition education regarding weight loss strategies.   Weight hx reviewed:  Wt Readings from Last 10 Encounters:  06/21/12 451 lb 1 oz (204.6 kg)  11/09/11 475 lb 11.2 oz (215.776 kg)  02/16/11 469 lb 3 oz (212.822 kg)   Weight hx indicates that pt has lost 24# (5%) x 6-7 months. Pt denies recent weight loss efforts, changes in appetite, or changes in diet habits. Weight loss is not significant. Pt reports that she has progressively gained weight for the past 15 years, due to decreased mobility. 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 Due to this, she reports that she is unable to walk far distances with her knee "shaking". She is able to walk short distances within her home. Prior to having her sons, she reports being very active, "walking all the time".    Pt reports that meal preparation responsibilities are shared between her father and herself. Pt skips 1 meal daily, either lunch or breakfast. If she eats breakfast, then she does not eat lunch and vice versa. Breakfast usually consists of bacon, eggs, and biscuit or leftovers from dinner, lunch consists of sandwich or leftovers, dinner consists of meat (chicken (mainly BBQ), roast, meatloaf, hamburger), white rice or baked french fries, and corn or spinach. She reports that her father uses a lot of butter in his cooking. She does not drink soda, tea, or coffee. Her beverages mainly consist of regular Kool Aid, which she drinks freely.   Educated pt on principles of weight management. Discussed principles of energy expenditure and how changes in diet and physical activity affect weight status. Discussed nutritional content of commonly eaten foods and suggested healthier alternatives. Educated pt on plate method and a general, healthful diet that includes low fat dairy, lean meats, whole  fruits and vegetables, and whole grains most often. Discussed importance of following a healthy diet and choosing lower calorie beverages most often, due to inability to participate in physical activity, to achieve weight loss goals and prevent chronic disease. Discussed how obesity puts pt at higher risk for developing chronic conditions. Encouraged slow, moderate weight loss (0.5-2# weight loss per week or 7-10% overall weight loss) and adopting healthy lifestyle changes vs. obtaining a certain body type or weight. Provided plate method handout. Used TeachBack to assess understanding.   Expect fair compliance.  Body mass index is 72.80 kg/(m^2). Pt meets criteria for extreme obesity, class II based on current BMI.  Current diet order is clear liquid, patient is consuming approximately 100% of meals at this time. Labs and medications reviewed. No further nutrition interventions warranted at this time. RD contact information provided. If additional nutrition issues arise, please re-consult RD.  Melody Haver, RD, LDN Pager: [phone]

## 2012-06-21 NOTE — Progress Notes (Signed)
Subjective:  pain worse today. No flatus. No bowel movement. Had subjective fevers and chills. No vomiting. She had subjective fevers and chills overnight. Does not want to increase pain medication, because it "makes (her) head feel funny". No vomiting. No bowel movement or flatus.  Objective: Vital signs in last 24 hours: Filed Vitals:   06/20/12 1724 06/20/12 2119 06/21/12 0043 06/21/12 0423  BP: 132/66 151/83 171/73 127/70  Pulse: 88 64 66 70  Temp: 98.1 F (36.7 C) 100.1 F (37.8 C) 98.3 F (36.8 C) 98.3 F (36.8 C)  TempSrc: Oral Oral Oral Oral  Resp: 20 20 20 20   Height: 5\' 6"  (1.676 m)     Weight: 204.9 kg (451 lb 11.6 oz)   204.6 kg (451 lb 1 oz)  SpO2: 97% 98% 99% 96%   Weight change:   Intake/Output Summary (Last 24 hours) at 06/21/12 0939 Last data filed at 06/21/12 0519  Gross per 24 hour  Intake 1946.67 ml  Output    600 ml  Net 1346.67 ml   General: Uncomfortable appearing. Lungs clear to auscultation bilaterally without wheeze rhonchi or rales Cardiovascular regular rate rhythm without murmurs gallops rubs Abdomen: No cellulitis noted. Slightly less tender compared to yesterday in the right lower quadrant Extremities: Erythema noted in the right thigh area with peau d'orange.  Lab Results: Basic Metabolic Panel:  Lab 06/21/12 1478 06/20/12 1240  NA 140 140  K 3.6 3.9  CL 104 103  CO2 28 26  GLUCOSE 131* 131*  BUN 12 17  CREATININE 0.84 0.80  CALCIUM 8.8 9.8  MG -- --  PHOS -- --   Liver Function Tests:  Lab 06/21/12 0527 06/20/12 1240  AST 37 38*  ALT 42* 41*  ALKPHOS 69 71  BILITOT 0.8 1.0  PROT 6.5 7.0  ALBUMIN 3.0* 3.6    Lab 06/20/12 1240  LIPASE 23  AMYLASE --   No results found for this basename: AMMONIA:2 in the last 168 hours CBC:  Lab 06/21/12 0527 06/20/12 1240  WBC 7.2 11.9*  NEUTROABS 4.4 9.4*  HGB 10.6* 11.1*  HCT 32.7* 33.8*  MCV 88.1 86.7  PLT 259 282   Urinalysis:  Lab 06/20/12 1248  COLORURINE YELLOW    LABSPEC >1.030*  PHURINE 6.0  GLUCOSEU NEGATIVE  HGBUR TRACE*  BILIRUBINUR SMALL*  KETONESUR TRACE*  PROTEINUR 30*  UROBILINOGEN 0.2  NITRITE NEGATIVE  LEUKOCYTESUR NEGATIVE   Scheduled Meds:   . buPROPion  300 mg Oral Daily  . ciprofloxacin  400 mg Intravenous Q12H  . escitalopram  20 mg Oral Daily  . [COMPLETED] fentaNYL  100 mcg Intravenous Once  . [COMPLETED] fentaNYL  50 mcg Intravenous Once  . heparin  5,000 Units Subcutaneous Q8H  . LORazepam  1 mg Oral TID  . meperidine      . metronidazole  500 mg Intravenous Q8H  . midazolam      . [COMPLETED] ondansetron  4 mg Intravenous Once  . [COMPLETED] sodium chloride  1,000 mL Intravenous Once  . [DISCONTINUED] ALPRAZolam  1 mg Oral TID   Continuous Infusions:   . sodium chloride 100 mL/hr at 06/20/12 1833   PRN Meds:.acetaminophen, acetaminophen, alum & mag hydroxide-simeth, HYDROcodone-acetaminophen, morphine injection, ondansetron (ZOFRAN) IV, ondansetron, traZODone Assessment/Plan:  Principal Problem:  *RLQ abdominal pain Active Problems:  HTN (hypertension)  Morbid obesity  Anxiety and depression  history of cholecystectomy  Patient reports that pain is worse today.  Discussed the case with x-ray tech. They will attempt a  KUB. Because of her weight, she is unable to get a CT scan or flat and upright abdominal x-rays. Rule out obstruction. Also within the differential is appendicitis. I have consulted Dr. Dian Situ. He recommends stopping antibiotics, making patient n.p.o., and serial abdominal exams. If her pain and other symptoms worsen, she may need to go to the operating room for exploratory laparoscopy/possible appendectomy. He recommends continuing subcutaneous heparin for DVT prophylaxis for now. Change to inpatient status.  Also, right thigh looks more erythematous today. Will watch for developing cellulitis.   LOS: 1 day   Stacey [name] L 06/21/2012, 9:39 AM

## 2012-06-22 DIAGNOSIS — R1031 Right lower quadrant pain: Secondary | ICD-10-CM

## 2012-06-22 LAB — CBC WITH DIFFERENTIAL/PLATELET
Eosinophils Relative: 4 % (ref 0–5)
HCT: 33.3 % — ABNORMAL LOW (ref 36.0–46.0)
MCHC: 32.7 g/dL (ref 30.0–36.0)
MCV: 87.4 fL (ref 78.0–100.0)
Monocytes Absolute: 0.5 10*3/uL (ref 0.1–1.0)
RDW: 13.4 % (ref 11.5–15.5)

## 2012-06-22 LAB — URINE CULTURE: Colony Count: NO GROWTH

## 2012-06-22 MED ORDER — BUSPIRONE HCL 5 MG PO TABS
10.0000 mg | ORAL_TABLET | Freq: Two times a day (BID) | ORAL | Status: DC
  Administered 2012-06-22 – 2012-06-23 (×3): 10 mg via ORAL
  Filled 2012-06-22 (×3): qty 2

## 2012-06-22 MED ORDER — CIPROFLOXACIN IN D5W 400 MG/200ML IV SOLN
400.0000 mg | Freq: Two times a day (BID) | INTRAVENOUS | Status: DC
  Administered 2012-06-22 – 2012-06-23 (×2): 400 mg via INTRAVENOUS
  Filled 2012-06-22 (×7): qty 200

## 2012-06-22 MED ORDER — METRONIDAZOLE IN NACL 5-0.79 MG/ML-% IV SOLN
500.0000 mg | Freq: Three times a day (TID) | INTRAVENOUS | Status: DC
  Administered 2012-06-22 – 2012-06-23 (×3): 500 mg via INTRAVENOUS
  Filled 2012-06-22 (×9): qty 100

## 2012-06-22 NOTE — Progress Notes (Signed)
Subjective: Patient states her pain is somewhat better. No fevers or chills. She has some appetite.  Objective: Vital signs in last 24 hours: Temp:  [97.9 F (36.6 C)-98.3 F (36.8 C)] 97.9 F (36.6 C) (11/22 0629) Pulse Rate:  [61-76] 61  (11/22 0629) Resp:  [16-18] 16  (11/22 0629) BP: (135-178)/(57-75) 135/57 mmHg (11/22 0629) SpO2:  [96 %-98 %] 96 % (11/22 0629) Weight:  [205.3 kg (452 lb 9.7 oz)] 205.3 kg (452 lb 9.7 oz) (11/22 0629) Last BM Date: 06/22/12  Intake/Output from previous day: 21-Jul-2023 0701 - 11/22 0700 In: 3895 [P.O.:1440; I.V.:2455] Out: -  Intake/Output this shift: Total I/O In: 480 [P.O.:480] Out: -   General appearance: alert and no distress GI: Soft, morbidly obese, mild to moderate right lower quadrant abdominal tenderness. No diffuse peritoneal signs.  Lab Results:   [facility] 06/22/12 0522 07-20-2012 0527  WBC 6.0 7.2  HGB 10.9* 10.6*  HCT 33.3* 32.7*  PLT 267 259   BMET  Basename Jul 20, 2012 0527 06/20/12 1240  NA 140 140  K 3.6 3.9  CL 104 103  CO2 28 26  GLUCOSE 131* 131*  BUN 12 17  CREATININE 0.84 0.80  CALCIUM 8.8 9.8   PT/INR No results found for this basename: LABPROT:2,INR:2 in the last 72 hours ABG No results found for this basename: PHART:2,PCO2:2,PO2:2,HCO3:2 in the last 72 hours  Studies/Results: Dg Abd 2 Views  07/20/12  *RADIOLOGY REPORT*  Clinical Data: Right lower quadrant pain  ABDOMEN - 2 VIEW  Comparison: None.  Findings: Supine and erect views of the abdomen show no bowel obstruction.  No free air is evident.  Surgical clips are present in the right upper quadrant from prior cholecystectomy.  There are degenerative changes throughout the lumbar spine and in both hips right greater than left.  IMPRESSION: No bowel obstruction.  No free air.   Original Report Authenticated By: Dwyane Dee, M.D.     Anti-infectives: Anti-infectives     Start     Dose/Rate Route Frequency Ordered Stop   06/22/12 1500   ciprofloxacin  (CIPRO) IVPB 400 mg        400 mg 200 mL/hr over 60 Minutes Intravenous Every 12 hours 06/22/12 1323     06/22/12 1500   metroNIDAZOLE (FLAGYL) IVPB 500 mg        500 mg 100 mL/hr over 60 Minutes Intravenous Every 8 hours 06/22/12 1323     06/20/12 1630   ciprofloxacin (CIPRO) IVPB 400 mg  Status:  Discontinued        400 mg 200 mL/hr over 60 Minutes Intravenous Every 12 hours 06/20/12 1618 Jul 20, 2012 0946   06/20/12 1630   metroNIDAZOLE (FLAGYL) IVPB 500 mg  Status:  Discontinued        500 mg 100 mL/hr over 60 Minutes Intravenous Every 8 hours 06/20/12 1618 July 20, 2012 0946          Assessment/Plan: s/p * No surgery found * Right lower quadrant abdominal pain. Patient continues to demonstrate a normal WBC count and improvement of her symptomatology. Clinically patient is not demonstrating any high suspicion at this time for appendicitis. I do feel it is reasonable to start the patient back on a diet and continue to monitor. She should continue to show improvement of the next 24 hours and possible discharge to home maybe considered tomorrow. Should any of her symptomatology worsen in the interim then I would again recommend proceeding to transfer to [facility] CT evaluation the  LOS: 2 days  Dashanique Brownstein C 06/22/2012

## 2012-06-22 NOTE — Progress Notes (Addendum)
Patient was not made NPO yesterday.  Subjective: Pain a little better today. Only a level I currently. She did require pain medication earlier today however. Tolerating clear liquids.  Objective: Vital signs in last 24 hours: Filed Vitals:   06/21/12 0423 06/21/12 1329 06/21/12 2131 06/22/12 0629  BP: 127/70 155/78 178/75 135/57  Pulse: 70 64 76 61  Temp: 98.3 F (36.8 C) 97.8 F (36.6 C) 98.3 F (36.8 C) 97.9 F (36.6 C)  TempSrc: Oral Oral Oral Oral  Resp: 20 20 18 16   Height:      Weight: 204.6 kg (451 lb 1 oz)   205.3 kg (452 lb 9.7 oz)  SpO2: 96% 100% 98% 96%   Weight change: -3.355 kg (-7 lb 6.3 oz)  Intake/Output Summary (Last 24 hours) at 06/22/12 1253 Last data filed at 06/22/12 0800  Gross per 24 hour  Intake   3935 ml  Output      0 ml  Net   3935 ml   General: More comfortable and talkative today. Patient is able to move around without difficulty. Lungs clear to auscultation bilaterally without wheeze rhonchi or rales Cardiovascular regular rate rhythm without murmurs gallops rubs Abdomen: No cellulitis noted. Right lower quadrant tenderness continues to improve. Today, it is minimal. Extremities: Erythema noted in the right thigh area with peau d'orange., Per patient this is chronic.  Lab Results: Basic Metabolic Panel:  Lab 06/21/12 1610 06/20/12 1240  NA 140 140  K 3.6 3.9  CL 104 103  CO2 28 26  GLUCOSE 131* 131*  BUN 12 17  CREATININE 0.84 0.80  CALCIUM 8.8 9.8  MG -- --  PHOS -- --   Liver Function Tests:  Lab 06/21/12 0527 06/20/12 1240  AST 37 38*  ALT 42* 41*  ALKPHOS 69 71  BILITOT 0.8 1.0  PROT 6.5 7.0  ALBUMIN 3.0* 3.6    Lab 06/20/12 1240  LIPASE 23  AMYLASE --   No results found for this basename: AMMONIA:2 in the last 168 hours CBC:  Lab 06/22/12 0522 06/21/12 0527  WBC 6.0 7.2  NEUTROABS 3.4 4.4  HGB 10.9* 10.6*  HCT 33.3* 32.7*  MCV 87.4 88.1  PLT 267 259   Urinalysis:  Lab 06/20/12 1248  COLORURINE YELLOW    LABSPEC >1.030*  PHURINE 6.0  GLUCOSEU NEGATIVE  HGBUR TRACE*  BILIRUBINUR SMALL*  KETONESUR TRACE*  PROTEINUR 30*  UROBILINOGEN 0.2  NITRITE NEGATIVE  LEUKOCYTESUR NEGATIVE   Scheduled Meds:    . buPROPion  300 mg Oral Daily  . escitalopram  20 mg Oral Daily  . heparin  5,000 Units Subcutaneous Q8H  . LORazepam  0.5 mg Intravenous TID   Continuous Infusions:    . sodium chloride 100 mL/hr at 06/22/12 0818   PRN Meds:.acetaminophen, acetaminophen, alum & mag hydroxide-simeth, ketorolac, morphine injection, ondansetron (ZOFRAN) IV, ondansetron Assessment/Plan:  Principal Problem:  *RLQ abdominal pain Active Problems:  HTN (hypertension)  Morbid obesity  Anxiety and depression  history of cholecystectomy  Pain improved today, and white blood cell count remains normal off antibiotics. She is less tender every day since admission. Suspect diverticulitis. Will discuss with Dr. Dian Situ, but anticipate it should be safe to resume IV antibiotics. X-ray showed no obstruction. Patient's leg erythema is reportedly chronic.  Also, patient reportedly takes BuSpar 10 mg twice daily. Will resume.   LOS: 2 days   Tiler Brandis L 06/22/2012, 12:53 PM  Addendum:  Discussed with Dr. Dian Situ.  He is okay with resuming antibiotics  and advancing diet.

## 2012-06-22 NOTE — Care Management Note (Signed)
    Page 1 of 1   06/22/2012     3:23:16 PM   CARE MANAGEMENT NOTE 06/22/2012  Patient:  CATALEYA, CRISTINA   Account Number:  [id number]  Date Initiated:  06/22/2012  Documentation initiated by:  Rosemary Holms  Subjective/Objective Assessment:   Pt admitted from home whree she lives with her father and two children ages 8 and 81. Believes she has diverticullitis based on her past symptoms. Where [facility] at home No O2.     Action/Plan:   DC home, no HH needs anticipated   Anticipated DC Date:  06/23/2012   Anticipated DC Plan:  HOME/SELF CARE      DC Planning Services  CM consult      Choice offered to / List presented to:             Status of service:  Completed, signed off Medicare Important Message given?   (If response is "NO", the following Medicare IM given date fields will be blank) Date Medicare IM given:   Date Additional Medicare IM given:    Discharge Disposition:  HOME/SELF CARE  Per UR Regulation:    If discussed at Long Length of Stay Meetings, dates discussed:    Comments:  06/22/12 Rosemary Holms RN BSN CM

## 2012-06-22 NOTE — Consult Note (Signed)
Reason for Consult: Right lower quadrant abdominal pain Referring Physician: [facility]  Stacey [name] is an 51 y.o. female.  HPI: Patient presented to [facility] with approximately 24 hours of increasing right lower quadrant abdominal pain. Patient states she's had similar symptomatology in the past and was told that this is diverticulitis. The pain didn't increase beyond what she experienced in the past. She has had some associated fevers and chills. Some associated nausea. No significant change in bowel movements. No melena or hematochezia. No unusual travel. No sick exposures. New medications  Past Medical History  Diagnosis Date  . Hypertension   . Depression   . Type 2 diabetes mellitus   . Diverticulitis   . Asthma   . Cyst     pt has "cyst" to legs for years, areas drain at time  . Pancreatitis   . Arthritis   . C. difficile colitis 11/08/2011  . Musculoskeletal pain 11/08/2011  . Neuropathic pain 11/10/2011    Past Surgical History  Procedure Date  . Tonsillectomy   . Cholecystectomy   . Cesarean section   . Nose surgery     History reviewed. No pertinent family history.  Social History:  reports that she has never smoked. She has never used smokeless tobacco. She reports that she does not drink alcohol or use illicit drugs.  Allergies:  Allergies  Allergen Reactions  . Nitroglycerin Anxiety  . Dilaudid (Hydromorphone Hcl)   . Sulfa Antibiotics     Medications:  I have reviewed the patient's current medications. Prior to Admission:  Prescriptions prior to admission  Medication Sig Dispense Refill  . ALPRAZolam (XANAX) 1 MG tablet Take 1 mg by mouth 3 (three) times daily. Patient states she only takes once daily most of the time      . B Complex-C (B-COMPLEX WITH VITAMIN C) tablet Take 1 tablet by mouth daily.      Marland Kitchen buPROPion (WELLBUTRIN XL) 300 MG 24 hr tablet Take 300 mg by mouth daily.        . cholecalciferol (VITAMIN D) 1000 UNITS tablet  Take 1,000 Units by mouth daily.      Marland Kitchen escitalopram (LEXAPRO) 20 MG tablet Take 20 mg by mouth daily.        . fish oil-omega-3 fatty acids 1000 MG capsule Take 1 g by mouth daily.      Marland Kitchen gabapentin (NEURONTIN) 100 MG capsule Take 1 capsule (100 mg total) by mouth 2 (two) times daily. FOR BURNING PAIN.  60 capsule  2  . hydrochlorothiazide (HYDRODIURIL) 25 MG tablet Take 25 mg by mouth daily.      Marland Kitchen ibuprofen (ADVIL,MOTRIN) 200 MG tablet Take 800 mg by mouth every 8 (eight) hours as needed. pain      . Turmeric 500 MG CAPS Take 1 capsule by mouth daily.       Scheduled:   . buPROPion  300 mg Oral Daily  . busPIRone  10 mg Oral BID  . ciprofloxacin  400 mg Intravenous Q12H  . escitalopram  20 mg Oral Daily  . heparin  5,000 Units Subcutaneous Q8H  . LORazepam  0.5 mg Intravenous TID  . metronidazole  500 mg Intravenous Q8H   Continuous:   . sodium chloride 100 mL/hr at 06/22/12 0818   HYQ:MVHQIONGEXBMW, acetaminophen, alum & mag hydroxide-simeth, ketorolac, morphine injection, ondansetron (ZOFRAN) IV, ondansetron  Results for orders placed during the hospital encounter of 06/20/12 (from the past 48 hour(s))  COMPREHENSIVE METABOLIC PANEL  Status: Abnormal   Collection Time   06/21/12  5:27 AM      Component Value Range Comment   Sodium 140  135 - 145 mEq/L    Potassium 3.6  3.5 - 5.1 mEq/L    Chloride 104  96 - 112 mEq/L    CO2 28  19 - 32 mEq/L    Glucose, Bld 131 (*) 70 - 99 mg/dL    BUN 12  6 - 23 mg/dL    Creatinine, Ser 9.60  0.50 - 1.10 mg/dL    Calcium 8.8  8.4 - 45.4 mg/dL    Total Protein 6.5  6.0 - 8.3 g/dL    Albumin 3.0 (*) 3.5 - 5.2 g/dL    AST 37  0 - 37 U/L    ALT 42 (*) 0 - 35 U/L    Alkaline Phosphatase 69  39 - 117 U/L    Total Bilirubin 0.8  0.3 - 1.2 mg/dL    GFR calc non Af Amer 81 (*) >90 mL/min    GFR calc Af Amer >90  >90 mL/min   CBC WITH DIFFERENTIAL     Status: Abnormal   Collection Time   06/21/12  5:27 AM      Component Value Range  Comment   WBC 7.2  4.0 - 10.5 K/uL    RBC 3.71 (*) 3.87 - 5.11 MIL/uL    Hemoglobin 10.6 (*) 12.0 - 15.0 g/dL    HCT 09.8 (*) 11.9 - 46.0 %    MCV 88.1  78.0 - 100.0 fL    MCH 28.6  26.0 - 34.0 pg    MCHC 32.4  30.0 - 36.0 g/dL    RDW 14.7  82.9 - 56.2 %    Platelets 259  150 - 400 K/uL    Neutrophils Relative 61  43 - 77 %    Neutro Abs 4.4  1.7 - 7.7 K/uL    Lymphocytes Relative 26  12 - 46 %    Lymphs Abs 1.9  0.7 - 4.0 K/uL    Monocytes Relative 10  3 - 12 %    Monocytes Absolute 0.7  0.1 - 1.0 K/uL    Eosinophils Relative 3  0 - 5 %    Eosinophils Absolute 0.2  0.0 - 0.7 K/uL    Basophils Relative 0  0 - 1 %    Basophils Absolute 0.0  0.0 - 0.1 K/uL   CBC WITH DIFFERENTIAL     Status: Abnormal   Collection Time   06/22/12  5:22 AM      Component Value Range Comment   WBC 6.0  4.0 - 10.5 K/uL    RBC 3.81 (*) 3.87 - 5.11 MIL/uL    Hemoglobin 10.9 (*) 12.0 - 15.0 g/dL    HCT 13.0 (*) 86.5 - 46.0 %    MCV 87.4  78.0 - 100.0 fL    MCH 28.6  26.0 - 34.0 pg    MCHC 32.7  30.0 - 36.0 g/dL    RDW 78.4  69.6 - 29.5 %    Platelets 267  150 - 400 K/uL    Neutrophils Relative 58  43 - 77 %    Neutro Abs 3.4  1.7 - 7.7 K/uL    Lymphocytes Relative 30  12 - 46 %    Lymphs Abs 1.8  0.7 - 4.0 K/uL    Monocytes Relative 9  3 - 12 %    Monocytes Absolute 0.5  0.1 -  1.0 K/uL    Eosinophils Relative 4  0 - 5 %    Eosinophils Absolute 0.2  0.0 - 0.7 K/uL    Basophils Relative 1  0 - 1 %    Basophils Absolute 0.0  0.0 - 0.1 K/uL     Dg Abd 2 Views  06/21/2012  *RADIOLOGY REPORT*  Clinical Data: Right lower quadrant pain  ABDOMEN - 2 VIEW  Comparison: None.  Findings: Supine and erect views of the abdomen show no bowel obstruction.  No free air is evident.  Surgical clips are present in the right upper quadrant from prior cholecystectomy.  There are degenerative changes throughout the lumbar spine and in both hips right greater than left.  IMPRESSION: No bowel obstruction.  No free air.    Original Report Authenticated By: Dwyane Dee, M.D.     Review of Systems  Constitutional: Positive for fever and chills.  HENT: Negative.   Eyes: Negative.   Respiratory: Positive for cough.   Cardiovascular: Negative.   Gastrointestinal: Positive for heartburn, nausea, vomiting, abdominal pain and diarrhea. Negative for constipation, blood in stool and melena.  Genitourinary: Positive for dysuria and hematuria.  Musculoskeletal: Negative.   Skin: Negative.   Neurological: Negative.   Endo/Heme/Allergies: Negative.   Psychiatric/Behavioral: Negative.    Blood pressure 135/57, pulse 61, temperature 97.9 F (36.6 C), temperature source Oral, resp. rate 16, height 5\' 6"  (1.676 m), weight 205.3 kg (452 lb 9.7 oz), last menstrual period 06/06/2012, SpO2 96.00%. Physical Exam  Constitutional: She appears well-developed and well-nourished. No distress.       Super Morbidly obese  HENT:  Head: Normocephalic and atraumatic.  Eyes: Conjunctivae normal and EOM are normal. Pupils are equal, round, and reactive to light.  Neck: Normal range of motion. Neck supple.  Cardiovascular: Normal rate, regular rhythm and normal heart sounds.   Respiratory: Effort normal and breath sounds normal.  GI: Soft. Bowel sounds are normal. She exhibits no distension. There is tenderness (moderate right lower quadrant tenderness). There is no rebound.       Exam extremely limited due to patient's body habitus.  Lymphadenopathy:    She has no cervical adenopathy.  Skin: Skin is warm and dry.    Assessment/Plan: Right lower quadrant abdominal pain. At this point unfortunately her extremely limited on further diagnostic testing do to the patient's orbit obesity. The location of the pain is obviously worrisome for early appendicitis. She is administered on antibiotics and her leukocytosis has improved. As I discussed with the patient does still may indicate an early appendicitis we'll continue to monitor her  symptoms. Her pain actually slightly increased from her day of admission which again has been worried that she may have a progressing process in the right lower quadrant. Should her symptomatology increase we have discussed options including transfer to [facility] to obtain a CT evaluation of the abdomen and pelvis as the radiology department is clipped with an appropriate bariatric CT scanner. Should her symptomatology progress to the point her transfer would not be an option and open surgical exploration has been discussed with the patient. She is clearly a high risk of surgical intervention do to her morbid obesity, hypertension, diabetes mellitus. I will continue to follow the patient. I would continue the patient n.p.o. status at this time for bowel rest as well as to palpation adequately readied for potential explanations should she progress in that direction.  Amritpal Shropshire C 06/22/2012, 2:53 PM

## 2012-06-23 DIAGNOSIS — I10 Essential (primary) hypertension: Secondary | ICD-10-CM

## 2012-06-23 DIAGNOSIS — K5732 Diverticulitis of large intestine without perforation or abscess without bleeding: Principal | ICD-10-CM

## 2012-06-23 MED ORDER — METRONIDAZOLE 500 MG PO TABS: 500.0000 mg | ORAL_TABLET | Freq: Three times a day (TID) | ORAL | Status: DC

## 2012-06-23 MED ORDER — CIPROFLOXACIN HCL 500 MG PO TABS: 500.0000 mg | ORAL_TABLET | Freq: Two times a day (BID) | ORAL | Status: DC

## 2012-06-23 NOTE — Discharge Summary (Signed)
Physician Discharge Summary  Stacey [name] AOZ:[id number] DOB: 22-Jan-1961 DOA: 06/20/2012    Admit date: 06/20/2012 Discharge date: 06/23/2012  Time spent: Greater than 30 minutes  Discharge Diagnoses:  1. Acute diverticulitis, improved. 2. Hypertension. 3. Morbid obesity.   Discharge Condition: Stable and improved.  Diet recommendation: Low glycemic nutrition.  Filed Weights   06/21/12 0423 06/22/12 0629 06/23/12 0644  Weight: 204.6 kg (451 lb 1 oz) 205.3 kg (452 lb 9.7 oz) 205 kg (451 lb 15.1 oz)    History of present illness:  This 51 year old morbidly obese lady presents to the hospital with symptoms of right lower quadrant abdominal pain. Please see initial history as outlined below: Stacey [name] is an 51 y.o. female , morbidly obese who presents with right lower quadrant pain. She reports it feels similar to her previous episodes of diverticulitis. She feels nauseated but has had no vomiting. No diarrhea. She's had night sweats and chills. No documented fevers. Her appetite has been poor. Her symptoms started yesterday. She exceeds the weight limit for the CAT scanner. She has had C. difficile in the past and her symptoms are not similar. She has a history also of pancreatitis and has had a cholecystectomy. Her white blood cell count in the emergency room is 11,000. She is not febrile. She is asking for something to drink. The pain is severe. She tried taking some pain medication at home which helped slightly.  Hospital Course:  The patient was treated clinically with intravenous antibiotics and supportive measures. Her weight of 450 pounds or so did not allow for CT scan at this hospital. She was therefore followed clinically. Her white count decreased and became normal in the next day or 2 after admission. She clinically improved with decreasing pain every day that she has been in the hospital and her diet has been advanced. She has had normal bowel motions. She has had no  vomiting. She was also seen by surgery, Dr. Leticia Penna, who agreed with the assessment of daily improvement. He had recommended a CT scan in [place] that could accommodate her weight only if she did not improve. She has had no fever. In light of clinical improvement, I think it is unnecessary at this point to do any imaging studies. She is now stable for discharge.  Procedures:  None.   Consultations: Surgery, Dr. Leticia Penna.  Discharge Exam: Filed Vitals:   06/22/12 0629 06/22/12 1554 06/22/12 2054 06/23/12 0644  BP: 135/57 146/81 153/61 153/64  Pulse: 61 83 39 70  Temp: 97.9 F (36.6 C) 98.2 F (36.8 C) 97.4 F (36.3 C) 97.4 F (36.3 C)  TempSrc: Oral Oral Oral Oral  Resp: 16 18 18 18   Height:      Weight: 205.3 kg (452 lb 9.7 oz)   205 kg (451 lb 15.1 oz)  SpO2: 96% 98% 100% 98%    General: She looks systemically well. She is not toxic or septic. She is morbidly obese  at 451 pounds. Cardiovascular: Heart sounds are present and normal without gallop rhythm. Respiratory: Lung fields are clinically clear. Abdomen is soft and really nontender at all in the right lower quadrant. Bowel sounds are heard.  Discharge Instructions  Discharge Orders    Future Orders Please Complete By Expires   Diet - low sodium heart healthy      Increase activity slowly          Medication List     As of 06/23/2012 12:41 PM    TAKE these  medications         ALPRAZolam 1 MG tablet   Commonly known as: XANAX   Take 1 mg by mouth 3 (three) times daily. Patient states she only takes once daily most of the time      B-complex with vitamin C tablet   Take 1 tablet by mouth daily.      buPROPion 300 MG 24 hr tablet   Commonly known as: WELLBUTRIN XL   Take 300 mg by mouth daily.      busPIRone 10 MG tablet   Commonly known as: BUSPAR   Take 10 mg by mouth 2 (two) times daily.      cholecalciferol 1000 UNITS tablet   Commonly known as: VITAMIN D   Take 1,000 Units by mouth daily.       ciprofloxacin 500 MG tablet   Commonly known as: CIPRO   Take 1 tablet (500 mg total) by mouth 2 (two) times daily.      escitalopram 20 MG tablet   Commonly known as: LEXAPRO   Take 20 mg by mouth daily.      fish oil-omega-3 fatty acids 1000 MG capsule   Take 1 g by mouth daily.      gabapentin 100 MG capsule   Commonly known as: NEURONTIN   Take 1 capsule (100 mg total) by mouth 2 (two) times daily. FOR BURNING PAIN.      hydrochlorothiazide 25 MG tablet   Commonly known as: HYDRODIURIL   Take 25 mg by mouth daily.      ibuprofen 200 MG tablet   Commonly known as: ADVIL,MOTRIN   Take 800 mg by mouth every 8 (eight) hours as needed. pain      metroNIDAZOLE 500 MG tablet   Commonly known as: FLAGYL   Take 1 tablet (500 mg total) by mouth 3 (three) times daily.      Turmeric 500 MG Caps   Take 1 capsule by mouth daily.          The results of significant diagnostics from this hospitalization (including imaging, microbiology, ancillary and laboratory) are listed below for reference.    Significant Diagnostic Studies: Dg Abd 2 Views  06/21/2012  *RADIOLOGY REPORT*  Clinical Data: Right lower quadrant pain  ABDOMEN - 2 VIEW  Comparison: None.  Findings: Supine and erect views of the abdomen show no bowel obstruction.  No free air is evident.  Surgical clips are present in the right upper quadrant from prior cholecystectomy.  There are degenerative changes throughout the lumbar spine and in both hips right greater than left.  IMPRESSION: No bowel obstruction.  No free air.   Original Report Authenticated By: Dwyane Dee, M.D.     Microbiology: Recent Results (from the past 240 hour(s))  URINE CULTURE     Status: Normal   Collection Time   06/20/12 12:48 PM      Component Value Range Status Comment   Specimen Description URINE, CLEAN CATCH   Final    Special Requests NONE   Final    Culture  Setup Time 06/21/2012 02:56   Final    Colony Count NO GROWTH   Final     Culture NO GROWTH   Final    Report Status 06/22/2012 FINAL   Final      Labs: Basic Metabolic Panel:  Lab 06/21/12 1610 06/20/12 1240  NA 140 140  K 3.6 3.9  CL 104 103  CO2 28 26  GLUCOSE 131* 131*  BUN 12 17  CREATININE 0.84 0.80  CALCIUM 8.8 9.8  MG -- --  PHOS -- --   Liver Function Tests:  Lab 06/21/12 0527 06/20/12 1240  AST 37 38*  ALT 42* 41*  ALKPHOS 69 71  BILITOT 0.8 1.0  PROT 6.5 7.0  ALBUMIN 3.0* 3.6    Lab 06/20/12 1240  LIPASE 23  AMYLASE --    CBC:  Lab 06/22/12 0522 06/21/12 0527 06/20/12 1240  WBC 6.0 7.2 11.9*  NEUTROABS 3.4 4.4 9.4*  HGB 10.9* 10.6* 11.1*  HCT 33.3* 32.7* 33.8*  MCV 87.4 88.1 86.7  PLT 267 259 282     BNP: BNP (last 3 results)  Basename 11/06/11 1931  PROBNP 1229.0*         Signed:  Cassady Turano C  [facility] 06/23/2012, 12:41 PM

## 2012-08-01 DIAGNOSIS — I2699 Other pulmonary embolism without acute cor pulmonale: Secondary | ICD-10-CM

## 2012-08-01 DIAGNOSIS — I82409 Acute embolism and thrombosis of unspecified deep veins of unspecified lower extremity: Secondary | ICD-10-CM

## 2012-08-01 HISTORY — DX: Acute embolism and thrombosis of unspecified deep veins of unspecified lower extremity: I82.409

## 2012-08-01 HISTORY — DX: Other pulmonary embolism without acute cor pulmonale: I26.99

## 2012-09-06 ENCOUNTER — Emergency Department (HOSPITAL_COMMUNITY)
Admission: EM | Admit: 2012-09-06 | Discharge: 2012-09-07 | Disposition: A | Discharge status: Home / Self Care | Payer: Medicaid Other | Attending: Emergency Medicine | Admitting: Emergency Medicine

## 2012-09-06 ENCOUNTER — Encounter (HOSPITAL_COMMUNITY): Payer: Self-pay | Admitting: *Deleted

## 2012-09-06 ENCOUNTER — Emergency Department (HOSPITAL_COMMUNITY): Payer: Medicaid Other

## 2012-09-06 DIAGNOSIS — R209 Unspecified disturbances of skin sensation: Secondary | ICD-10-CM | POA: Insufficient documentation

## 2012-09-06 DIAGNOSIS — B349 Viral infection, unspecified: Secondary | ICD-10-CM

## 2012-09-06 DIAGNOSIS — IMO0001 Reserved for inherently not codable concepts without codable children: Secondary | ICD-10-CM | POA: Insufficient documentation

## 2012-09-06 DIAGNOSIS — R1084 Generalized abdominal pain: Secondary | ICD-10-CM | POA: Insufficient documentation

## 2012-09-06 DIAGNOSIS — F329 Major depressive disorder, single episode, unspecified: Secondary | ICD-10-CM | POA: Insufficient documentation

## 2012-09-06 DIAGNOSIS — Z8619 Personal history of other infectious and parasitic diseases: Secondary | ICD-10-CM | POA: Insufficient documentation

## 2012-09-06 DIAGNOSIS — E119 Type 2 diabetes mellitus without complications: Secondary | ICD-10-CM | POA: Insufficient documentation

## 2012-09-06 DIAGNOSIS — R110 Nausea: Secondary | ICD-10-CM | POA: Insufficient documentation

## 2012-09-06 DIAGNOSIS — F3289 Other specified depressive episodes: Secondary | ICD-10-CM | POA: Insufficient documentation

## 2012-09-06 DIAGNOSIS — M545 Low back pain, unspecified: Secondary | ICD-10-CM | POA: Insufficient documentation

## 2012-09-06 DIAGNOSIS — Z79899 Other long term (current) drug therapy: Secondary | ICD-10-CM | POA: Insufficient documentation

## 2012-09-06 DIAGNOSIS — J45901 Unspecified asthma with (acute) exacerbation: Secondary | ICD-10-CM | POA: Insufficient documentation

## 2012-09-06 DIAGNOSIS — I10 Essential (primary) hypertension: Secondary | ICD-10-CM | POA: Insufficient documentation

## 2012-09-06 DIAGNOSIS — M129 Arthropathy, unspecified: Secondary | ICD-10-CM | POA: Insufficient documentation

## 2012-09-06 DIAGNOSIS — R059 Cough, unspecified: Secondary | ICD-10-CM | POA: Insufficient documentation

## 2012-09-06 DIAGNOSIS — M791 Myalgia, unspecified site: Secondary | ICD-10-CM

## 2012-09-06 DIAGNOSIS — Z8719 Personal history of other diseases of the digestive system: Secondary | ICD-10-CM | POA: Insufficient documentation

## 2012-09-06 DIAGNOSIS — G8929 Other chronic pain: Secondary | ICD-10-CM | POA: Insufficient documentation

## 2012-09-06 DIAGNOSIS — Z872 Personal history of diseases of the skin and subcutaneous tissue: Secondary | ICD-10-CM | POA: Insufficient documentation

## 2012-09-06 DIAGNOSIS — M79609 Pain in unspecified limb: Secondary | ICD-10-CM | POA: Insufficient documentation

## 2012-09-06 DIAGNOSIS — B9789 Other viral agents as the cause of diseases classified elsewhere: Secondary | ICD-10-CM | POA: Insufficient documentation

## 2012-09-06 DIAGNOSIS — R6883 Chills (without fever): Secondary | ICD-10-CM | POA: Insufficient documentation

## 2012-09-06 DIAGNOSIS — R05 Cough: Secondary | ICD-10-CM | POA: Insufficient documentation

## 2012-09-06 LAB — BASIC METABOLIC PANEL
BUN: 13 mg/dL (ref 6–23)
CO2: 29 mEq/L (ref 19–32)
Calcium: 9.5 mg/dL (ref 8.4–10.5)
Chloride: 100 mEq/L (ref 96–112)
Creatinine, Ser: 0.94 mg/dL (ref 0.50–1.10)
GFR calc Af Amer: 81 mL/min — ABNORMAL LOW (ref >90)
GFR calc non Af Amer: 70 mL/min — ABNORMAL LOW (ref >90)
Glucose, Bld: 152 mg/dL — ABNORMAL HIGH (ref 70–99)
Potassium: 3.6 mEq/L (ref 3.5–5.1)
Sodium: 137 mEq/L (ref 135–145)

## 2012-09-06 LAB — CBC WITH DIFFERENTIAL/PLATELET
Eosinophils Absolute: 0.2 10*3/uL (ref 0.0–0.7)
Hemoglobin: 11.1 g/dL — ABNORMAL LOW (ref 12.0–15.0)
Lymphs Abs: 2.3 10*3/uL (ref 0.7–4.0)
MCH: 28.8 pg (ref 26.0–34.0)
Monocytes Relative: 6 % (ref 3–12)
Neutro Abs: 5.4 10*3/uL (ref 1.7–7.7)
Neutrophils Relative %: 64 % (ref 43–77)
Platelets: 244 10*3/uL (ref 150–400)
RBC: 3.86 MIL/uL — ABNORMAL LOW (ref 3.87–5.11)
WBC: 8.5 10*3/uL (ref 4.0–10.5)

## 2012-09-06 LAB — URINALYSIS, ROUTINE W REFLEX MICROSCOPIC
Hgb urine dipstick: NEGATIVE
Nitrite: NEGATIVE
Specific Gravity, Urine: >1.03 — ABNORMAL HIGH (ref 1.005–1.030)
Urobilinogen, UA: 0.2 mg/dL (ref 0.0–1.0)
pH: 6 (ref 5.0–8.0)

## 2012-09-06 MED ORDER — HYDROCODONE-ACETAMINOPHEN 5-325 MG PO TABS: 1.0000 | ORAL_TABLET | Freq: Four times a day (QID) | ORAL | Status: AC | PRN

## 2012-09-06 MED ORDER — NAPROXEN 500 MG PO TABS: 500.0000 mg | ORAL_TABLET | Freq: Two times a day (BID) | ORAL | Status: DC

## 2012-09-06 MED ORDER — KETOROLAC TROMETHAMINE 60 MG/2ML IM SOLN
60.0000 mg | Freq: Once | INTRAMUSCULAR | Status: AC
  Administered 2012-09-06: 60 mg via INTRAMUSCULAR
  Filled 2012-09-06: qty 2

## 2012-09-06 NOTE — ED Notes (Signed)
Pain from "base of my neck to my feet".  Having trouble staying awake.

## 2012-09-06 NOTE — ED Provider Notes (Signed)
History  This chart was scribed for Shelda Jakes, MD by Bennett Scrape, ED Scribe. This patient was seen in room APA03/APA03 and the patient's care was started at 7:58 PM.  CSN: [id number]  Arrival date & time 09/06/12  1905   First MD Initiated Contact with Patient 09/06/12 1958      Chief Complaint  Patient presents with  . Back Pain     Patient is a 52 y.o. female presenting with back pain. The history is provided by the patient. No language interpreter was used.  Back Pain  This is a chronic problem. The current episode started yesterday. The problem occurs constantly. The problem has been gradually worsening. The pain is associated with no known injury. The pain is present in the lumbar spine. The pain radiates to the left thigh and right thigh. Associated symptoms include abdominal pain (diffuse). Pertinent negatives include no chest pain, no fever and no headaches.    Stacey [name] is a 52 y.o. female who presents to the Emergency Department complaining of gradual worsening of chronic back pain with associated worsening of chronic leg pain that started yesterday and 3 days of myalgias, diffuse abdominal pain, nausea, chills, mild SOB, non-productive cough and mild numbness on the bottom of the left foot. Pt also reports difficulty walking secondary to pain. She states that at baseline she has pain in her upper legs and lower back. She reports that currently she has lower back pain that radiates up to the neck and shoulders and leg pain that radiates down to bilateral feet. She denies prior episodes of pain this severe. She states that she has f/u with her PCP and taking ibuprofen and 100 mg gabapentin twice daily for her chronic pain. She denies having a specialist for the chronic back pain and denies having a MRI for her back performed. She has prior diagnosis of DM but denies being on medication currently. She also has a h/o HTN, asthma and depression. She denies smoking and  alcohol use.  PCP is with [facility]  Past Medical History  Diagnosis Date  . Hypertension   . Depression   . Type 2 diabetes mellitus   . Diverticulitis   . Asthma   . Cyst     pt has "cyst" to legs for years, areas drain at time  . Pancreatitis   . Arthritis   . C. difficile colitis 11/08/2011  . Musculoskeletal pain 11/08/2011  . Neuropathic pain 11/10/2011    Past Surgical History  Procedure Date  . Tonsillectomy   . Cholecystectomy   . Cesarean section   . Nose surgery     History reviewed. No pertinent family history.  History  Substance Use Topics  . Smoking status: Never Smoker   . Smokeless tobacco: Never Used  . Alcohol Use: No    No OB history provided.  Review of Systems  Constitutional: Positive for chills. Negative for fever.  HENT: Negative for congestion, sore throat, rhinorrhea and neck pain.   Eyes: Negative for visual disturbance.  Respiratory: Positive for cough and shortness of breath (mild).   Cardiovascular: Negative for chest pain.  Gastrointestinal: Positive for abdominal pain (diffuse). Negative for nausea, vomiting and diarrhea.  Genitourinary: Negative for difficulty urinating.  Musculoskeletal: Positive for myalgias and back pain.  Skin: Negative for rash.  Neurological: Negative for headaches.  All other systems reviewed and are negative.    Allergies  Nitroglycerin; Dilaudid; and Sulfa antibiotics  Home Medications  Current Outpatient Rx  Name  Route  Sig  Dispense  Refill  . B COMPLEX-C PO TABS   Oral   Take 1 tablet by mouth daily.         . BUPROPION HCL ER (XL) 300 MG PO TB24   Oral   Take 300 mg by mouth daily.           . BUSPIRONE HCL 10 MG PO TABS   Oral   Take 10 mg by mouth 2 (two) times daily.         Marland Kitchen VITAMIN D 1000 UNITS PO TABS   Oral   Take 1,000 Units by mouth daily.         Marland Kitchen ESCITALOPRAM OXALATE 20 MG PO TABS   Oral   Take 20 mg by mouth daily.           .  OMEGA-3 FATTY ACIDS 1000 MG PO CAPS   Oral   Take 1 g by mouth daily.         Marland Kitchen GABAPENTIN 100 MG PO CAPS   Oral   Take 1 capsule (100 mg total) by mouth 2 (two) times daily. FOR BURNING PAIN.   60 capsule   2   . HYDROCHLOROTHIAZIDE 25 MG PO TABS   Oral   Take 25 mg by mouth daily.         . IBUPROFEN 200 MG PO TABS   Oral   Take 800 mg by mouth every 8 (eight) hours as needed. pain         . LORAZEPAM 1 MG PO TABS   Oral   Take 1 mg by mouth 3 (three) times daily as needed. For anxiety         . TURMERIC 500 MG PO CAPS   Oral   Take 1 capsule by mouth daily.         Marland Kitchen HYDROCODONE-ACETAMINOPHEN 5-325 MG PO TABS   Oral   Take 1-2 tablets by mouth every 6 (six) hours as needed for pain.   10 tablet   0   . NAPROXEN 500 MG PO TABS   Oral   Take 1 tablet (500 mg total) by mouth 2 (two) times daily.   14 tablet   2     Triage Vitals: BP 160/58  Pulse 77  Temp 99.2 F (37.3 C) (Oral)  Resp 24  Ht 5\' 6"  (1.676 m)  Wt 455 lb 1.6 oz (206.432 kg)  BMI 73.46 kg/m2  SpO2 100%  LMP 07/02/2012  Physical Exam  Nursing note and vitals reviewed. Constitutional: She is oriented to person, place, and time. She appears well-developed and well-nourished. No distress.  HENT:  Head: Normocephalic and atraumatic.       Moist MM  Eyes: Conjunctivae normal and EOM are normal. No scleral icterus.  Neck: Neck supple. No tracheal deviation present.  Cardiovascular: Normal rate and regular rhythm.   No murmur heard. Pulmonary/Chest: Effort normal and breath sounds normal. No respiratory distress.  Abdominal: Soft. Bowel sounds are normal. There is no tenderness.  Musculoskeletal: Normal range of motion.  Neurological: She is alert and oriented to person, place, and time. No cranial nerve deficit.       Pt able to move both sets of fingers and toes  Skin: Skin is warm and dry.  Psychiatric: She has a normal mood and affect. Her behavior is normal.    ED Course   Procedures (including critical care time)  DIAGNOSTIC STUDIES:  Oxygen Saturation is 100% on room air, normal by my interpretation.    COORDINATION OF CARE: 8:19 PM- Discussed treatment plan which includes pain control, CXR, CBC panel, and UA with pt at bedside and pt agreed to plan.   8:45 PM- Ordered 60 mg Toradol injection  Labs Reviewed  CBC WITH DIFFERENTIAL - Abnormal; Notable for the following:    RBC 3.86 (*)     Hemoglobin 11.1 (*)     HCT 33.3 (*)     All other components within normal limits  BASIC METABOLIC PANEL - Abnormal; Notable for the following:    Glucose, Bld 152 (*)     GFR calc non Af Amer 70 (*)     GFR calc Af Amer 81 (*)     All other components within normal limits  URINALYSIS, ROUTINE W REFLEX MICROSCOPIC - Abnormal; Notable for the following:    Specific Gravity, Urine >1.030 (*)     All other components within normal limits   Dg Chest 2 View  09/06/2012  *RADIOLOGY REPORT*  Clinical Data: Back pain.  Cough and generalized body aches. Irregular menstrual cycles.  CHEST - 2 VIEW  Comparison: 11/06/2011  Findings: Cardiac enlargement with normal pulmonary vascularity. Mild hyperinflation.  No focal consolidation or airspace disease. No blunting of costophrenic angles.  No pneumothorax.  Mediastinal contours appear intact.  Mild degenerative changes in the spine.  IMPRESSION: Mild hyperinflation and cardiac enlargement.  No evidence of active pulmonary disease.   Original Report Authenticated By: Burman Nieves, M.D.    Results for orders placed during the hospital encounter of 09/06/12  CBC WITH DIFFERENTIAL      Component Value Range   WBC 8.5  4.0 - 10.5 K/uL   RBC 3.86 (*) 3.87 - 5.11 MIL/uL   Hemoglobin 11.1 (*) 12.0 - 15.0 g/dL   HCT 95.6 (*) 21.3 - 08.6 %   MCV 86.3  78.0 - 100.0 fL   MCH 28.8  26.0 - 34.0 pg   MCHC 33.3  30.0 - 36.0 g/dL   RDW 57.8  46.9 - 62.9 %   Platelets 244  150 - 400 K/uL   Neutrophils Relative 64  43 - 77 %   Neutro  Abs 5.4  1.7 - 7.7 K/uL   Lymphocytes Relative 27  12 - 46 %   Lymphs Abs 2.3  0.7 - 4.0 K/uL   Monocytes Relative 6  3 - 12 %   Monocytes Absolute 0.5  0.1 - 1.0 K/uL   Eosinophils Relative 3  0 - 5 %   Eosinophils Absolute 0.2  0.0 - 0.7 K/uL   Basophils Relative 0  0 - 1 %   Basophils Absolute 0.0  0.0 - 0.1 K/uL  BASIC METABOLIC PANEL      Component Value Range   Sodium 137  135 - 145 mEq/L   Potassium 3.6  3.5 - 5.1 mEq/L   Chloride 100  96 - 112 mEq/L   CO2 29  19 - 32 mEq/L   Glucose, Bld 152 (*) 70 - 99 mg/dL   BUN 13  6 - 23 mg/dL   Creatinine, Ser 5.28  0.50 - 1.10 mg/dL   Calcium 9.5  8.4 - 41.3 mg/dL   GFR calc non Af Amer 70 (*) >90 mL/min   GFR calc Af Amer 81 (*) >90 mL/min  URINALYSIS, ROUTINE W REFLEX MICROSCOPIC      Component Value Range   Color, Urine YELLOW  YELLOW  APPearance CLEAR  CLEAR   Specific Gravity, Urine >1.030 (*) 1.005 - 1.030   pH 6.0  5.0 - 8.0   Glucose, UA NEGATIVE  NEGATIVE mg/dL   Hgb urine dipstick NEGATIVE  NEGATIVE   Bilirubin Urine NEGATIVE  NEGATIVE   Ketones, ur NEGATIVE  NEGATIVE mg/dL   Protein, ur NEGATIVE  NEGATIVE mg/dL   Urobilinogen, UA 0.2  0.0 - 1.0 mg/dL   Nitrite NEGATIVE  NEGATIVE   Leukocytes, UA NEGATIVE  NEGATIVE     1. Chronic back pain   2. Myalgia   3. Viral illness       MDM   Workup in the emergency department negative urinalysis negative no leukocytosis. No electrolyte abnormalities. Patient has a history of chronic back pain clinically it seems as if she may be fighting off a viral illness she has increased bodyaches which is exacerbating her back pain. No significant neurological deficits in the lower extremities or upper extremities. Patient treated with poor goal here in the emergency department with some improvement. Will change her Motrin over to Naprosyn since his last for 12 hours and also 10 tablets of hydrocodone as needed to supplement for more severe bodyaches and help her sleep. Patient  will followup with her doctors in [place]. Short-term here for any newer worse symptoms.  Chest x-ray was negative for pneumonia.      I personally performed the services described in this documentation, which was scribed in my presence. The recorded information has been reviewed and is accurate.     Shelda Jakes, MD 09/06/12 [phone]

## 2012-10-09 ENCOUNTER — Encounter (HOSPITAL_COMMUNITY): Payer: Self-pay | Admitting: *Deleted

## 2012-10-09 ENCOUNTER — Emergency Department (HOSPITAL_COMMUNITY): Payer: Medicaid Other

## 2012-10-09 ENCOUNTER — Emergency Department (HOSPITAL_COMMUNITY)
Admission: EM | Admit: 2012-10-09 | Discharge: 2012-10-09 | Disposition: A | Discharge status: Home / Self Care | Payer: Medicaid Other | Attending: Emergency Medicine | Admitting: Emergency Medicine

## 2012-10-09 DIAGNOSIS — F3289 Other specified depressive episodes: Secondary | ICD-10-CM | POA: Insufficient documentation

## 2012-10-09 DIAGNOSIS — J209 Acute bronchitis, unspecified: Secondary | ICD-10-CM

## 2012-10-09 DIAGNOSIS — F329 Major depressive disorder, single episode, unspecified: Secondary | ICD-10-CM | POA: Insufficient documentation

## 2012-10-09 DIAGNOSIS — R059 Cough, unspecified: Secondary | ICD-10-CM | POA: Insufficient documentation

## 2012-10-09 DIAGNOSIS — IMO0001 Reserved for inherently not codable concepts without codable children: Secondary | ICD-10-CM | POA: Insufficient documentation

## 2012-10-09 DIAGNOSIS — J069 Acute upper respiratory infection, unspecified: Secondary | ICD-10-CM | POA: Insufficient documentation

## 2012-10-09 DIAGNOSIS — R110 Nausea: Secondary | ICD-10-CM | POA: Insufficient documentation

## 2012-10-09 DIAGNOSIS — E119 Type 2 diabetes mellitus without complications: Secondary | ICD-10-CM | POA: Insufficient documentation

## 2012-10-09 DIAGNOSIS — J45901 Unspecified asthma with (acute) exacerbation: Secondary | ICD-10-CM | POA: Insufficient documentation

## 2012-10-09 DIAGNOSIS — R0989 Other specified symptoms and signs involving the circulatory and respiratory systems: Secondary | ICD-10-CM | POA: Insufficient documentation

## 2012-10-09 DIAGNOSIS — Z872 Personal history of diseases of the skin and subcutaneous tissue: Secondary | ICD-10-CM | POA: Insufficient documentation

## 2012-10-09 DIAGNOSIS — M129 Arthropathy, unspecified: Secondary | ICD-10-CM | POA: Insufficient documentation

## 2012-10-09 DIAGNOSIS — Z79899 Other long term (current) drug therapy: Secondary | ICD-10-CM | POA: Insufficient documentation

## 2012-10-09 DIAGNOSIS — R0609 Other forms of dyspnea: Secondary | ICD-10-CM | POA: Insufficient documentation

## 2012-10-09 DIAGNOSIS — Z8739 Personal history of other diseases of the musculoskeletal system and connective tissue: Secondary | ICD-10-CM | POA: Insufficient documentation

## 2012-10-09 DIAGNOSIS — R509 Fever, unspecified: Secondary | ICD-10-CM | POA: Insufficient documentation

## 2012-10-09 DIAGNOSIS — J029 Acute pharyngitis, unspecified: Secondary | ICD-10-CM | POA: Insufficient documentation

## 2012-10-09 DIAGNOSIS — R0982 Postnasal drip: Secondary | ICD-10-CM | POA: Insufficient documentation

## 2012-10-09 DIAGNOSIS — Z8719 Personal history of other diseases of the digestive system: Secondary | ICD-10-CM | POA: Insufficient documentation

## 2012-10-09 DIAGNOSIS — Z8619 Personal history of other infectious and parasitic diseases: Secondary | ICD-10-CM | POA: Insufficient documentation

## 2012-10-09 DIAGNOSIS — I10 Essential (primary) hypertension: Secondary | ICD-10-CM | POA: Insufficient documentation

## 2012-10-09 LAB — RAPID STREP SCREEN (MED CTR MEBANE ONLY): Streptococcus, Group A Screen (Direct): NEGATIVE

## 2012-10-09 MED ORDER — IPRATROPIUM BROMIDE 0.02 % IN SOLN
0.5000 mg | Freq: Once | RESPIRATORY_TRACT | Status: AC
  Administered 2012-10-09: 0.5 mg via RESPIRATORY_TRACT
  Filled 2012-10-09: qty 2.5

## 2012-10-09 MED ORDER — ALBUTEROL SULFATE HFA 108 (90 BASE) MCG/ACT IN AERS: 2.0000 | INHALATION_SPRAY | RESPIRATORY_TRACT | Status: DC | PRN

## 2012-10-09 MED ORDER — PREDNISONE 50 MG PO TABS
60.0000 mg | ORAL_TABLET | Freq: Once | ORAL | Status: AC
  Administered 2012-10-09: 60 mg via ORAL
  Filled 2012-10-09: qty 1

## 2012-10-09 MED ORDER — PREDNISONE 50 MG PO TABS: 50.0000 mg | ORAL_TABLET | Freq: Every day | ORAL | Status: DC

## 2012-10-09 MED ORDER — ALBUTEROL SULFATE (5 MG/ML) 0.5% IN NEBU
2.5000 mg | INHALATION_SOLUTION | Freq: Once | RESPIRATORY_TRACT | Status: AC
  Administered 2012-10-09: 2.5 mg via RESPIRATORY_TRACT
  Filled 2012-10-09: qty 0.5

## 2012-10-09 NOTE — ED Notes (Signed)
C/o cough and SOB x 3 days, NP cough, nasal congestion

## 2012-10-09 NOTE — ED Provider Notes (Signed)
History    This chart was scribed for Dione Booze, MD by Gerlean Ren, ED Scribe. This patient was seen in room APA16A/APA16A and the patient's care was started at 3:23 PM    CSN: [id number]  Arrival date & time 10/09/12  1509   First MD Initiated Contact with Patient 10/09/12 1519      Chief Complaint  Patient presents with  . Shortness of Breath  . Cough     The history is provided by the patient. No language interpreter was used.  Stacey [name] is a 52 y.o. female who presents to the Emergency Department complaining of 3 days of constant dry cough with associated chest congestion and dyspnea, postnasal drip, sore throat, myalgias rated as 10/10 in severity of pain, waxing-and-waning chills/subjective fever and waxing-and-waning nausea.  Pt denies abdominal pain, emesis, constipation, diarrhea.  Pt reports son had similar symptoms recently that have since resolved without treatment.  Pt reports h/o similar symptom patterns that have been improved by breathing treatments.  Pt has inhaler at home that is out-dated.  Pt has used sinus OCM without improvements.  No flu shot this year.  Pt denies tobacco and alcohol use.  PCP in [place].   Past Medical History  Diagnosis Date  . Hypertension   . Depression   . Type 2 diabetes mellitus   . Diverticulitis   . Asthma   . Cyst     pt has "cyst" to legs for years, areas drain at time  . Pancreatitis   . Arthritis   . C. difficile colitis 11/08/2011  . Musculoskeletal pain 11/08/2011  . Neuropathic pain 11/10/2011    Past Surgical History  Procedure Laterality Date  . Tonsillectomy    . Cholecystectomy    . Cesarean section    . Nose surgery      History reviewed. No pertinent family history.  History  Substance Use Topics  . Smoking status: Never Smoker   . Smokeless tobacco: Never Used  . Alcohol Use: No    No OB history provided.   Review of Systems  Constitutional: Positive for fever (subjective) and chills.  HENT:  Positive for congestion, sore throat and postnasal drip.   Respiratory: Positive for cough and shortness of breath.        Chest congestion  Cardiovascular: Negative for chest pain.  Gastrointestinal: Positive for nausea (waxing-and-waning). Negative for vomiting, abdominal pain and diarrhea.  Musculoskeletal: Positive for myalgias.  All other systems reviewed and are negative.    Allergies  Nitroglycerin; Dilaudid; and Sulfa antibiotics  Home Medications   Current Outpatient Rx  Name  Route  Sig  Dispense  Refill  . B Complex-C (B-COMPLEX WITH VITAMIN C) tablet   Oral   Take 1 tablet by mouth daily.         Marland Kitchen buPROPion (WELLBUTRIN XL) 300 MG 24 hr tablet   Oral   Take 300 mg by mouth daily.           . busPIRone (BUSPAR) 10 MG tablet   Oral   Take 10 mg by mouth 2 (two) times daily.         . cholecalciferol (VITAMIN D) 1000 UNITS tablet   Oral   Take 1,000 Units by mouth daily.         Marland Kitchen escitalopram (LEXAPRO) 20 MG tablet   Oral   Take 20 mg by mouth daily.           . fish oil-omega-3 fatty  acids 1000 MG capsule   Oral   Take 1 g by mouth daily.         Marland Kitchen gabapentin (NEURONTIN) 100 MG capsule   Oral   Take 1 capsule (100 mg total) by mouth 2 (two) times daily. FOR BURNING PAIN.   60 capsule   2   . hydrochlorothiazide (HYDRODIURIL) 25 MG tablet   Oral   Take 25 mg by mouth daily.         Marland Kitchen ibuprofen (ADVIL,MOTRIN) 200 MG tablet   Oral   Take 800 mg by mouth every 8 (eight) hours as needed. pain         . LORazepam (ATIVAN) 1 MG tablet   Oral   Take 1 mg by mouth 3 (three) times daily as needed. For anxiety         . naproxen (NAPROSYN) 500 MG tablet   Oral   Take 1 tablet (500 mg total) by mouth 2 (two) times daily.   14 tablet   2   . Turmeric 500 MG CAPS   Oral   Take 1 capsule by mouth daily.           BP 157/41  Pulse 86  Temp(Src) 98.2 F (36.8 C) (Oral)  Resp 22  Ht 5\' 6"  (1.676 m)  Wt 449 lb (203.665 kg)  BMI  72.51 kg/m2  SpO2 97%  Physical Exam  Nursing note and vitals reviewed. Constitutional: She is oriented to person, place, and time. She appears well-developed and well-nourished. No distress.  Morbidly obese.  HENT:  Head: Normocephalic and atraumatic.  Mild erythema of the oropharynx.  Eyes: EOM are normal.  Neck: Neck supple. No tracheal deviation present.  Cardiovascular: Normal rate.   Pulmonary/Chest: Effort normal. No respiratory distress. She has wheezes. She has no rales.  Diffuse scattered wheezes.  Musculoskeletal: Normal range of motion.  Neurological: She is alert and oriented to person, place, and time.  Skin: Skin is warm and dry.  Psychiatric: She has a normal mood and affect. Her behavior is normal.    ED Course  Procedures (including critical care time) DIAGNOSTIC STUDIES: Oxygen Saturation is 97% on room air, adequate by my interpretation.    COORDINATION OF CARE: 3:29 PM- Patient informed of clinical course including breathing treatment, strep screen, and chest XR.  Pt understands medical decision-making process and agrees with plan.  5:35 PM Reevaluation: Slight improvement following albuterol with Atrovent. On reexam, she continues to have wheezing although there is improved airflow. Albuterol with Atrovent will be repeated.  6:43 PM Reevaluation: After second albuterol with Atrovent nebulizer treatment, she is feeling much better and lungs are completely clear. She is discharged with prescriptions for albuterol inhaler and prednisone.  Results for orders placed during the hospital encounter of 10/09/12  RAPID STREP SCREEN      Result Value Range   Streptococcus, Group A Screen (Direct) NEGATIVE  NEGATIVE    Dg Chest 2 View  10/09/2012  *RADIOLOGY REPORT*  Clinical Data: Shortness of breath and cough  CHEST - 2 VIEW  Comparison: September 06, 2012  Findings: There is new airspace consolidation in the superior segment right lower lobe.  Elsewhere, lungs are  clear.  Heart is mildly prominent with normal pulmonary vascularity, stable.  No adenopathy.  No bone lesions.  IMPRESSION: Superior segment right lower lobe airspace consolidation. There is stable cardiac prominence.   Original Report Authenticated By: Bretta Bang, M.D.      1. Upper respiratory infection  2. Bronchospasm with bronchitis, acute       MDM  Or respiratory tract infection which is probably influenza. She is beyond the treatment window for antiviral treatment. Chest x-ray be obtained to rule out pneumonia and rapid strep screen is obtained to rule out septic: Flexion of. She'll be given prednisone as well as albuterol with Atrovent nebulizer treatments.  I personally performed the services described in this documentation, which was scribed in my presence. The recorded information has been reviewed and is accurate.          Dione Booze, MD 10/09/12 [phone]

## 2012-10-28 ENCOUNTER — Emergency Department (HOSPITAL_COMMUNITY): Payer: Medicaid Other

## 2012-10-28 ENCOUNTER — Encounter (HOSPITAL_COMMUNITY): Payer: Self-pay | Admitting: Emergency Medicine

## 2012-10-28 ENCOUNTER — Inpatient Hospital Stay (HOSPITAL_COMMUNITY)
Admission: EM | Admit: 2012-10-28 | Discharge: 2012-10-30 | DRG: 392 | Disposition: A | Discharge status: Home / Self Care | Payer: Medicaid Other | Attending: Internal Medicine | Admitting: Internal Medicine

## 2012-10-28 DIAGNOSIS — J45909 Unspecified asthma, uncomplicated: Secondary | ICD-10-CM | POA: Diagnosis present

## 2012-10-28 DIAGNOSIS — K5792 Diverticulitis of intestine, part unspecified, without perforation or abscess without bleeding: Secondary | ICD-10-CM

## 2012-10-28 DIAGNOSIS — Z6841 Body Mass Index (BMI) 40.0 and over, adult: Secondary | ICD-10-CM

## 2012-10-28 DIAGNOSIS — D72829 Elevated white blood cell count, unspecified: Secondary | ICD-10-CM | POA: Diagnosis present

## 2012-10-28 DIAGNOSIS — I10 Essential (primary) hypertension: Secondary | ICD-10-CM | POA: Diagnosis present

## 2012-10-28 DIAGNOSIS — R1032 Left lower quadrant pain: Principal | ICD-10-CM | POA: Diagnosis present

## 2012-10-28 DIAGNOSIS — R001 Bradycardia, unspecified: Secondary | ICD-10-CM

## 2012-10-28 DIAGNOSIS — R0600 Dyspnea, unspecified: Secondary | ICD-10-CM | POA: Diagnosis present

## 2012-10-28 DIAGNOSIS — K529 Noninfective gastroenteritis and colitis, unspecified: Secondary | ICD-10-CM

## 2012-10-28 DIAGNOSIS — N390 Urinary tract infection, site not specified: Secondary | ICD-10-CM | POA: Diagnosis present

## 2012-10-28 DIAGNOSIS — E119 Type 2 diabetes mellitus without complications: Secondary | ICD-10-CM | POA: Diagnosis present

## 2012-10-28 DIAGNOSIS — E876 Hypokalemia: Secondary | ICD-10-CM

## 2012-10-28 DIAGNOSIS — I4949 Other premature depolarization: Secondary | ICD-10-CM | POA: Diagnosis present

## 2012-10-28 DIAGNOSIS — M7918 Myalgia, other site: Secondary | ICD-10-CM

## 2012-10-28 DIAGNOSIS — A0472 Enterocolitis due to Clostridium difficile, not specified as recurrent: Secondary | ICD-10-CM

## 2012-10-28 DIAGNOSIS — I493 Ventricular premature depolarization: Secondary | ICD-10-CM | POA: Diagnosis present

## 2012-10-28 DIAGNOSIS — R002 Palpitations: Secondary | ICD-10-CM

## 2012-10-28 DIAGNOSIS — F3289 Other specified depressive episodes: Secondary | ICD-10-CM

## 2012-10-28 DIAGNOSIS — Z809 Family history of malignant neoplasm, unspecified: Secondary | ICD-10-CM

## 2012-10-28 DIAGNOSIS — Z8249 Family history of ischemic heart disease and other diseases of the circulatory system: Secondary | ICD-10-CM

## 2012-10-28 DIAGNOSIS — R0989 Other specified symptoms and signs involving the circulatory and respiratory systems: Secondary | ICD-10-CM

## 2012-10-28 DIAGNOSIS — F32A Depression, unspecified: Secondary | ICD-10-CM | POA: Diagnosis present

## 2012-10-28 DIAGNOSIS — F411 Generalized anxiety disorder: Secondary | ICD-10-CM | POA: Diagnosis present

## 2012-10-28 DIAGNOSIS — R0789 Other chest pain: Secondary | ICD-10-CM

## 2012-10-28 DIAGNOSIS — M129 Arthropathy, unspecified: Secondary | ICD-10-CM | POA: Diagnosis present

## 2012-10-28 DIAGNOSIS — Z833 Family history of diabetes mellitus: Secondary | ICD-10-CM

## 2012-10-28 DIAGNOSIS — M792 Neuralgia and neuritis, unspecified: Secondary | ICD-10-CM

## 2012-10-28 DIAGNOSIS — F329 Major depressive disorder, single episode, unspecified: Secondary | ICD-10-CM | POA: Diagnosis present

## 2012-10-28 DIAGNOSIS — R1031 Right lower quadrant pain: Secondary | ICD-10-CM

## 2012-10-28 DIAGNOSIS — G4733 Obstructive sleep apnea (adult) (pediatric): Secondary | ICD-10-CM | POA: Diagnosis present

## 2012-10-28 DIAGNOSIS — F419 Anxiety disorder, unspecified: Secondary | ICD-10-CM

## 2012-10-28 LAB — URINALYSIS, ROUTINE W REFLEX MICROSCOPIC
Glucose, UA: NEGATIVE mg/dL
Ketones, ur: NEGATIVE mg/dL
Leukocytes, UA: NEGATIVE
pH: 5.5 (ref 5.0–8.0)

## 2012-10-28 LAB — URINE MICROSCOPIC-ADD ON

## 2012-10-28 LAB — COMPREHENSIVE METABOLIC PANEL
ALT: 36 U/L — ABNORMAL HIGH (ref 0–35)
AST: 40 U/L — ABNORMAL HIGH (ref 0–37)
Albumin: 3.5 g/dL (ref 3.5–5.2)
Alkaline Phosphatase: 69 U/L (ref 39–117)
CO2: 25 mEq/L (ref 19–32)
Chloride: 97 mEq/L (ref 96–112)
Potassium: 3.9 mEq/L (ref 3.5–5.1)
Total Bilirubin: 0.9 mg/dL (ref 0.3–1.2)

## 2012-10-28 LAB — CBC
Platelets: 263 10*3/uL (ref 150–400)
RBC: 4.39 MIL/uL (ref 3.87–5.11)
RDW: 14 % (ref 11.5–15.5)
WBC: 17.2 10*3/uL — ABNORMAL HIGH (ref 4.0–10.5)

## 2012-10-28 MED ORDER — ACETAMINOPHEN 325 MG PO TABS: 650.0000 mg | ORAL_TABLET | Freq: Four times a day (QID) | ORAL | Status: DC | PRN

## 2012-10-28 MED ORDER — GABAPENTIN 100 MG PO CAPS
100.0000 mg | ORAL_CAPSULE | Freq: Two times a day (BID) | ORAL | Status: DC
  Administered 2012-10-28 – 2012-10-30 (×4): 100 mg via ORAL
  Filled 2012-10-28 (×4): qty 1

## 2012-10-28 MED ORDER — METRONIDAZOLE IN NACL 5-0.79 MG/ML-% IV SOLN
500.0000 mg | Freq: Three times a day (TID) | INTRAVENOUS | Status: DC
  Administered 2012-10-29 – 2012-10-30 (×5): 500 mg via INTRAVENOUS
  Filled 2012-10-28 (×8): qty 100

## 2012-10-28 MED ORDER — ONDANSETRON HCL 4 MG PO TABS: 4.0000 mg | ORAL_TABLET | Freq: Four times a day (QID) | ORAL | Status: DC | PRN

## 2012-10-28 MED ORDER — DEXTROSE-NACL 5-0.45 % IV SOLN
INTRAVENOUS | Status: DC
  Administered 2012-10-28 – 2012-10-30 (×3): via INTRAVENOUS

## 2012-10-28 MED ORDER — ALBUTEROL SULFATE (5 MG/ML) 0.5% IN NEBU
2.5000 mg | INHALATION_SOLUTION | Freq: Four times a day (QID) | RESPIRATORY_TRACT | Status: DC
  Administered 2012-10-29 – 2012-10-30 (×6): 2.5 mg via RESPIRATORY_TRACT
  Filled 2012-10-28 (×5): qty 0.5

## 2012-10-28 MED ORDER — ENOXAPARIN SODIUM 100 MG/ML ~~LOC~~ SOLN
100.0000 mg | SUBCUTANEOUS | Status: DC
  Administered 2012-10-28 – 2012-10-29 (×2): 100 mg via SUBCUTANEOUS
  Filled 2012-10-28 (×2): qty 1

## 2012-10-28 MED ORDER — CIPROFLOXACIN IN D5W 400 MG/200ML IV SOLN
400.0000 mg | Freq: Two times a day (BID) | INTRAVENOUS | Status: DC
  Administered 2012-10-29 – 2012-10-30 (×3): 400 mg via INTRAVENOUS
  Filled 2012-10-28 (×5): qty 200

## 2012-10-28 MED ORDER — ASPIRIN EC 81 MG PO TBEC
81.0000 mg | DELAYED_RELEASE_TABLET | Freq: Every day | ORAL | Status: DC
  Administered 2012-10-28 – 2012-10-30 (×3): 81 mg via ORAL
  Filled 2012-10-28 (×3): qty 1

## 2012-10-28 MED ORDER — MORPHINE SULFATE 2 MG/ML IJ SOLN
2.0000 mg | INTRAMUSCULAR | Status: DC | PRN
  Administered 2012-10-29 (×3): 2 mg via INTRAVENOUS
  Filled 2012-10-28 (×3): qty 1

## 2012-10-28 MED ORDER — SODIUM CHLORIDE 0.9 % IV BOLUS (SEPSIS)
1000.0000 mL | Freq: Once | INTRAVENOUS | Status: AC
  Administered 2012-10-28: 1000 mL via INTRAVENOUS

## 2012-10-28 MED ORDER — HYDROCHLOROTHIAZIDE 25 MG PO TABS
25.0000 mg | ORAL_TABLET | Freq: Every evening | ORAL | Status: DC
  Administered 2012-10-29: 25 mg via ORAL
  Filled 2012-10-28 (×2): qty 1

## 2012-10-28 MED ORDER — NAPROXEN 250 MG PO TABS
500.0000 mg | ORAL_TABLET | Freq: Two times a day (BID) | ORAL | Status: DC
  Administered 2012-10-29 – 2012-10-30 (×3): 500 mg via ORAL
  Filled 2012-10-28 (×2): qty 2
  Filled 2012-10-28: qty 1
  Filled 2012-10-28: qty 2
  Filled 2012-10-28: qty 1

## 2012-10-28 MED ORDER — ENOXAPARIN SODIUM 40 MG/0.4ML ~~LOC~~ SOLN: 40.0000 mg | SUBCUTANEOUS | Status: DC

## 2012-10-28 MED ORDER — BUSPIRONE HCL 5 MG PO TABS
15.0000 mg | ORAL_TABLET | Freq: Three times a day (TID) | ORAL | Status: DC
  Administered 2012-10-28 – 2012-10-30 (×5): 15 mg via ORAL
  Filled 2012-10-28: qty 3
  Filled 2012-10-28: qty 2
  Filled 2012-10-28: qty 1
  Filled 2012-10-28 (×3): qty 3

## 2012-10-28 MED ORDER — METRONIDAZOLE IN NACL 5-0.79 MG/ML-% IV SOLN
500.0000 mg | Freq: Once | INTRAVENOUS | Status: DC
  Administered 2012-10-28: 500 mg via INTRAVENOUS
  Filled 2012-10-28: qty 100

## 2012-10-28 MED ORDER — ONDANSETRON HCL 4 MG/2ML IJ SOLN: 4.0000 mg | Freq: Four times a day (QID) | INTRAMUSCULAR | Status: DC | PRN

## 2012-10-28 MED ORDER — CIPROFLOXACIN IN D5W 400 MG/200ML IV SOLN
400.0000 mg | Freq: Once | INTRAVENOUS | Status: AC
  Administered 2012-10-28: 400 mg via INTRAVENOUS
  Filled 2012-10-28: qty 200

## 2012-10-28 MED ORDER — LORAZEPAM 1 MG PO TABS
1.0000 mg | ORAL_TABLET | Freq: Three times a day (TID) | ORAL | Status: DC | PRN
  Administered 2012-10-28: 1 mg via ORAL
  Filled 2012-10-28: qty 1

## 2012-10-28 MED ORDER — ESCITALOPRAM OXALATE 10 MG PO TABS
20.0000 mg | ORAL_TABLET | Freq: Every day | ORAL | Status: DC
  Administered 2012-10-29 – 2012-10-30 (×2): 20 mg via ORAL
  Filled 2012-10-28 (×2): qty 2

## 2012-10-28 MED ORDER — ONDANSETRON HCL 4 MG/2ML IJ SOLN: 4.0000 mg | Freq: Once | INTRAMUSCULAR | Status: DC

## 2012-10-28 MED ORDER — SODIUM CHLORIDE 0.9 % IJ SOLN
3.0000 mL | Freq: Two times a day (BID) | INTRAMUSCULAR | Status: DC
  Administered 2012-10-28: 3 mL via INTRAVENOUS

## 2012-10-28 MED ORDER — ALBUTEROL SULFATE HFA 108 (90 BASE) MCG/ACT IN AERS: 2.0000 | INHALATION_SPRAY | RESPIRATORY_TRACT | Status: DC | PRN

## 2012-10-28 MED ORDER — ACETAMINOPHEN 650 MG RE SUPP: 650.0000 mg | Freq: Four times a day (QID) | RECTAL | Status: DC | PRN

## 2012-10-28 MED ORDER — BUPROPION HCL ER (XL) 300 MG PO TB24
300.0000 mg | ORAL_TABLET | Freq: Every morning | ORAL | Status: DC
  Administered 2012-10-29 – 2012-10-30 (×2): 300 mg via ORAL
  Filled 2012-10-28 (×3): qty 1

## 2012-10-28 NOTE — ED Notes (Signed)
Pt on menses at this time

## 2012-10-28 NOTE — ED Notes (Signed)
MD at bedside. 

## 2012-10-28 NOTE — H&P (Addendum)
[facility] History and Physical  Stacey [name]:[id number] DOB: 1961-05-02 DOA: 10/28/2012   PCP: She does not have one. Specialists: She is followed by a psychiatrist  Chief Complaint: Left lower abdominal pain since this morning  HPI: Stacey [name] is a 52 y.o. female who is morbidly obese, has a history of hypertension, depression, and anxiety, who has also has history of recurrent episodes of diverticulitis. She was seen in the emergency department about 2 weeks ago, and was diagnosed with pneumonia and bronchitis. She was treated for the same. It is unclear if she was ever given antibiotics for it. She comes in with the complaint of left lower quadrant abdominal pain, ongoing since about 6 AM this morning. The pain is 10 out of 10 in intensity, radiating to the back. Denies any fevers. She had some nausea, but denies any episodes of vomiting. No diarrhea. She had normal bowel movement yesterday. She tells me that she always is dizzy. She has been having some dyspnea with exertion, along with some heaviness in the chest at times. But none with her lying down. She's also had a dry cough. No hemoptysis. She tells me, that she's had many episodes of diverticulitis. She was last admitted here back in November for the same. Due to her morbid obesity CT scan could not be done during the previous hospitalization nor could it be done tonight. She however, did say that she had a CT scan 2 years ago at [facility], which confirmed that she had diverticulitis. And her symptoms today feel the same as that time. Denies any leg swelling.  Home Medications: Prior to Admission medications   Medication Sig Start Date End Date Taking? Authorizing Provider  albuterol (PROVENTIL HFA;VENTOLIN HFA) 108 (90 BASE) MCG/ACT inhaler Inhale 2 puffs into the lungs every 4 (four) hours as needed for wheezing or shortness of breath (or coughing). 10/09/12  Yes Dione Booze, MD  buPROPion (WELLBUTRIN XL) 300 MG 24  hr tablet Take 300 mg by mouth every morning.    Yes Historical Provider, MD  busPIRone (BUSPAR) 15 MG tablet Take 15 mg by mouth 3 (three) times daily.    Yes Historical Provider, MD  escitalopram (LEXAPRO) 20 MG tablet Take 20 mg by mouth daily.     Yes Historical Provider, MD  gabapentin (NEURONTIN) 100 MG capsule Take 1 capsule (100 mg total) by mouth 2 (two) times daily. FOR BURNING PAIN. 11/10/11 11/09/12 Yes Elliot Cousin, MD  hydrochlorothiazide (HYDRODIURIL) 25 MG tablet Take 25 mg by mouth every evening.    Yes Historical Provider, MD  ibuprofen (ADVIL,MOTRIN) 200 MG tablet Take 800 mg by mouth every 8 (eight) hours as needed. pain   Yes Historical Provider, MD  LORazepam (ATIVAN) 1 MG tablet Take 1 mg by mouth 3 (three) times daily as needed. For anxiety   Yes Historical Provider, MD  naproxen (NAPROSYN) 500 MG tablet Take 1 tablet (500 mg total) by mouth 2 (two) times daily. 09/06/12  Yes Shelda Jakes, MD  Turmeric 500 MG CAPS Take 1 capsule by mouth daily.    Historical Provider, MD    Allergies:  Allergies  Allergen Reactions  . Nitroglycerin Anxiety  . Dilaudid (Hydromorphone Hcl) Other (See Comments)    Hallucination  . Sulfa Antibiotics Rash    Past Medical History: Past Medical History  Diagnosis Date  . Hypertension   . Depression   . Type 2 diabetes mellitus   . Diverticulitis   . Asthma   .  Cyst     pt has "cyst" to legs for years, areas drain at time  . Pancreatitis   . Arthritis   . C. difficile colitis 11/08/2011  . Musculoskeletal pain 11/08/2011  . Neuropathic pain 11/10/2011    Past Surgical History  Procedure Laterality Date  . Tonsillectomy    . Cholecystectomy    . Cesarean section    . Nose surgery      Social History:  reports that she has never smoked. She has never used smokeless tobacco. She reports that she does not drink alcohol or use illicit drugs.  Living Situation: She lives in [place] with her father and her son's Activity  Level: Does not really ambulate much. Uses a electric scooter to get around.   Family History: There is history of, diabetes, cancer, and heart disease in the family. None of which she was able to specify further.  Review of Systems - History obtained from the patient General ROS: positive for  - fatigue Psychological ROS: negative Ophthalmic ROS: negative ENT ROS: negative Allergy and Immunology ROS: negative Hematological and Lymphatic ROS: negative Endocrine ROS: negative Respiratory ROS: as in hpi Cardiovascular ROS: as in hpi Gastrointestinal ROS: as in hpi Genito-Urinary ROS: no dysuria, trouble voiding, or hematuria Musculoskeletal ROS: negative Neurological ROS: no TIA or stroke symptoms Dermatological ROS: negative  Physical Examination  Filed Vitals:   10/28/12 1810 10/28/12 1924 10/28/12 1927  BP: 162/72  148/40  Pulse: 89  69  Temp: 98.9 F (37.2 C)  98.1 F (36.7 C)  TempSrc: Oral  Oral  Resp: 20  20  Height: 5\' 6"  (1.676 m)    Weight: 204.119 kg (450 lb) 204.204 kg (450 lb 3 oz)   SpO2: 97%  96%    General appearance: alert, cooperative, appears stated age, no distress and morbidly obese Head: Normocephalic, without obvious abnormality, atraumatic Eyes: conjunctivae/corneas clear. PERRL, EOM's intact.  Throat: Dry mucous membranes. Neck: no adenopathy, no carotid bruit, no JVD, supple, symmetrical, trachea midline and thyroid not enlarged, symmetric, no tenderness/mass/nodules Resp: clear to auscultation bilaterally Cardio:  S1 S2 is normal. Regular. No S3, S4. Systolic murmur appreciated at the apex. No rubs, or bruits. No pitting edema. GI: Abdomen is soft. Tenderness is present in the left lower quadrant without any rebound, rigidity, or guarding. There is some tenderness in the suprapubic area as well. She is morbidly obese. Examination is limited. Extremities: extremities normal, atraumatic, no cyanosis or edema Pulses: 2+ and symmetric Skin: Skin  color, texture, turgor normal. No rashes or lesions Lymph nodes: Cervical, supraclavicular, and axillary nodes normal. Neurologic: She is alert and oriented x3. No focal neurological deficits are present.  Laboratory Data: Results for orders placed during the hospital encounter of 10/28/12 (from the past 48 hour(s))  CBC     Status: Abnormal   Collection Time    10/28/12  5:53 PM      Result Value Range   WBC 17.2 (*) 4.0 - 10.5 K/uL   RBC 4.39  3.87 - 5.11 MIL/uL   Hemoglobin 12.5  12.0 - 15.0 g/dL   HCT 95.6  21.3 - 08.6 %   MCV 84.7  78.0 - 100.0 fL   MCH 28.5  26.0 - 34.0 pg   MCHC 33.6  30.0 - 36.0 g/dL   RDW 57.8  46.9 - 62.9 %   Platelets 263  150 - 400 K/uL  COMPREHENSIVE METABOLIC PANEL     Status: Abnormal   Collection Time  10/28/12  5:53 PM      Result Value Range   Sodium 134 (*) 135 - 145 mEq/L   Potassium 3.9  3.5 - 5.1 mEq/L   Chloride 97  96 - 112 mEq/L   CO2 25  19 - 32 mEq/L   Glucose, Bld 177 (*) 70 - 99 mg/dL   BUN 16  6 - 23 mg/dL   Creatinine, Ser 1.30  0.50 - 1.10 mg/dL   Calcium 9.6  8.4 - 86.5 mg/dL   Total Protein 7.0  6.0 - 8.3 g/dL   Albumin 3.5  3.5 - 5.2 g/dL   AST 40 (*) 0 - 37 U/L   ALT 36 (*) 0 - 35 U/L   Alkaline Phosphatase 69  39 - 117 U/L   Total Bilirubin 0.9  0.3 - 1.2 mg/dL   GFR calc non Af Amer 85 (*) >90 mL/min   GFR calc Af Amer >90  >90 mL/min   Comment:            The eGFR has been calculated     using the CKD EPI equation.     This calculation has not been     validated in all clinical     situations.     eGFR's persistently     <90 mL/min signify     possible Chronic Kidney Disease.  LIPASE, BLOOD     Status: None   Collection Time    10/28/12  5:53 PM      Result Value Range   Lipase 20  11 - 59 U/L  GLUCOSE, CAPILLARY     Status: Abnormal   Collection Time    10/28/12  6:13 PM      Result Value Range   Glucose-Capillary 169 (*) 70 - 99 mg/dL   Comment 1 Documented in Chart    URINALYSIS, ROUTINE W REFLEX  MICROSCOPIC     Status: Abnormal   Collection Time    10/28/12  6:40 PM      Result Value Range   Color, Urine YELLOW  YELLOW   APPearance CLEAR  CLEAR   Specific Gravity, Urine >1.030 (*) 1.005 - 1.030   pH 5.5  5.0 - 8.0   Glucose, UA NEGATIVE  NEGATIVE mg/dL   Hgb urine dipstick LARGE (*) NEGATIVE   Bilirubin Urine MODERATE (*) NEGATIVE   Ketones, ur NEGATIVE  NEGATIVE mg/dL   Protein, ur TRACE (*) NEGATIVE mg/dL   Urobilinogen, UA 1.0  0.0 - 1.0 mg/dL   Nitrite POSITIVE (*) NEGATIVE   Leukocytes, UA NEGATIVE  NEGATIVE  URINE MICROSCOPIC-ADD ON     Status: Abnormal   Collection Time    10/28/12  6:40 PM      Result Value Range   Squamous Epithelial / LPF MANY (*) RARE   WBC, UA 0-2  <3 WBC/hpf   RBC / HPF 11-20  <3 RBC/hpf   Bacteria, UA FEW (*) RARE    Radiology Reports: Dg Abd Acute W/chest  10/28/2012  *RADIOLOGY REPORT*  Clinical Data: Low abdominal pain with nausea today.  History of diverticulitis and pneumonia.  ACUTE ABDOMEN SERIES (ABDOMEN 2 VIEW & CHEST 1 VIEW)  Comparison: 10/09/2012 and 09/06/2012 chest radiographs.  Abdominal radiographs 06/21/2012.  Findings: The heart size and mediastinal contours are stable.  The superior segment right lower lobe air space disease noted on the most recent radiographs has resolved.  The lungs are now clear. There is no pleural effusion.  Abdominal radiographs are somewhat  limited by body habitus.  The abdomen is nearly gasless with stool throughout the colon.  No distended loops of bowel are identified.  There is no evidence of free intraperitoneal air or suspicious calcification. Cholecystectomy clips and degenerative changes of the lumbar spine and hips ( right greater than left) are noted.  IMPRESSION:  1.  Interval resolution of right lower lobe air space disease. 2.  No acute abdominal findings identified.   Original Report Authenticated By: Carey Bullocks, M.D.     Electrocardiogram: EKG shows sinus rhythm at 85 beats per  minute. There is a evidence for left axis deviation. PVCs are seen. No concerning Q waves. No concerning ST or T-wave changes are noted. EKG is quite similar to the one from November of last year.  Problem List  Principal Problem:   LLQ abdominal pain Active Problems:   Morbid obesity   HTN (hypertension)   Depression   PVC's (premature ventricular contractions)   UTI (lower urinary tract infection)   Dyspnea   Assessment: This is a 52 year old, Caucasian female, who presents with the left lower quadrant abdominal pain. This is presumed to be diverticulitis considering elevated WBC and history of same. Because of her morbid obesity a CT scan cannot be done at this time at this hospital. She is above the weight limit for the CT scan. Patient is hemodynamically stable. Her symptoms are similar to what she has had in the past. And she was hospitalized for similar reason back in November.  Plan: #1 left lower quadrant abdominal pain, presumed to be acute diverticulitis: We will treat her with Cipro and Flagyl for now. Keep her n.p.o. If her symptoms do not improve, consideration should be given to transferring her to [place] long for CT scan, as apparently, they can accommodate up to 485 pounds. I have discussed with this patient and she is agreeable with the plan. There does not seem to be an urgent need for transfer tonight.  #2 abnormal UA/Possible UTI: UA is somewhat contaminated due to patient's menstrual periods. We will send urine for culture. Continue with antibiotics as outlined above. She does not have any symptoms of UTI.  #3 dyspnea with the chest heaviness: EKG does not show any acute findings. She does have a history of sleep apnea. She probably has obesity hypoventilation syndrome as well. Symptoms also could be due to lingering effects of recent bronchitis and pneumonia. She's not hypoxic. Her heart rate is normal. Chest x-ray shows clearing of the abnormality seen from 2 weeks ago.  I don't suspect any venous thrombi embolism at this time. Echocardiogram from last year showed a normal EF. We will check troponins and monitor her on telemetry. She does have a history of frequent PVCs in the past as well.  #4 history of obstructive sleep apnea: Continue with CPAP.  #5 history of hypertension: Continue with hydrochlorothiazide.  #6 history of depression and anxiety: Continue with her psychotropic agents.  #7 morbid obesity: Unfortunately this condition limits various diagnostic evaluations in this patient. She should consider talking to a bariatric surgeon.  DVT Prophylaxis: Enoxaparin adjusted for weight Code Status: She is a full code Family Communication: Discussed with the patient and her sister in detail  Disposition Plan: Likely return home when improved.   Further management decisions will depend on results of further testing and patient's response to treatment.  [facility]  [facility] Pager [phone]  If 7PM-7AM, please contact night-coverage [email or web address] Password [facility]  10/28/2012, 8:42 PM

## 2012-10-28 NOTE — ED Notes (Signed)
Lower abd pain since this am. Nausea. Denies v/d. States has hx of diverticulitis. Became sob x 2 hours now. Denies cough. No resp distress noted. nad at this time. Mm moist. n

## 2012-10-28 NOTE — ED Notes (Signed)
Pt placed on telemetry, pt with frequent PVC's

## 2012-10-28 NOTE — ED Provider Notes (Signed)
History     CSN: [id number]  Arrival date & time 10/28/12  1737   First MD Initiated Contact with Patient 10/28/12 1745      Chief Complaint  Patient presents with  . Shortness of Breath  . Abdominal Pain    (Consider location/radiation/quality/duration/timing/severity/associated sxs/prior treatment) Patient is a 52 y.o. female presenting with abdominal pain. The history is provided by the patient (the pt complains of abd pain.  she states it is like when she had divertiuclitis). No language interpreter was used.  Abdominal Pain Pain location:  LLQ Pain quality: aching   Pain radiates to:  Does not radiate Pain severity:  Moderate Onset quality:  Gradual Timing:  Constant Progression:  Waxing and waning Chronicity:  Recurrent Context: alcohol use   Associated symptoms: no chest pain, no cough, no diarrhea, no fatigue and no hematuria     Past Medical History  Diagnosis Date  . Hypertension   . Depression   . Type 2 diabetes mellitus   . Diverticulitis   . Asthma   . Cyst     pt has "cyst" to legs for years, areas drain at time  . Pancreatitis   . Arthritis   . C. difficile colitis 11/08/2011  . Musculoskeletal pain 11/08/2011  . Neuropathic pain 11/10/2011    Past Surgical History  Procedure Laterality Date  . Tonsillectomy    . Cholecystectomy    . Cesarean section    . Nose surgery      History reviewed. No pertinent family history.  History  Substance Use Topics  . Smoking status: Never Smoker   . Smokeless tobacco: Never Used  . Alcohol Use: No    OB History   Grav Para Term Preterm Abortions TAB SAB Ect Mult Living                  Review of Systems  Constitutional: Negative for fatigue.  HENT: Negative for congestion, sinus pressure and ear discharge.   Eyes: Negative for discharge.  Respiratory: Negative for cough.   Cardiovascular: Negative for chest pain.  Gastrointestinal: Positive for abdominal pain. Negative for diarrhea.   Genitourinary: Negative for frequency and hematuria.  Musculoskeletal: Negative for back pain.  Skin: Negative for rash.  Neurological: Negative for seizures and headaches.  Psychiatric/Behavioral: Negative for hallucinations.    Allergies  Nitroglycerin; Dilaudid; and Sulfa antibiotics  Home Medications   Current Outpatient Rx  Name  Route  Sig  Dispense  Refill  . albuterol (PROVENTIL HFA;VENTOLIN HFA) 108 (90 BASE) MCG/ACT inhaler   Inhalation   Inhale 2 puffs into the lungs every 4 (four) hours as needed for wheezing or shortness of breath (or coughing).   1 Inhaler   0   . buPROPion (WELLBUTRIN XL) 300 MG 24 hr tablet   Oral   Take 300 mg by mouth every morning.          . busPIRone (BUSPAR) 15 MG tablet   Oral   Take 15 mg by mouth 3 (three) times daily.          Marland Kitchen escitalopram (LEXAPRO) 20 MG tablet   Oral   Take 20 mg by mouth daily.           Marland Kitchen gabapentin (NEURONTIN) 100 MG capsule   Oral   Take 1 capsule (100 mg total) by mouth 2 (two) times daily. FOR BURNING PAIN.   60 capsule   2   . hydrochlorothiazide (HYDRODIURIL) 25 MG tablet   Oral  Take 25 mg by mouth every evening.          Marland Kitchen ibuprofen (ADVIL,MOTRIN) 200 MG tablet   Oral   Take 800 mg by mouth every 8 (eight) hours as needed. pain         . LORazepam (ATIVAN) 1 MG tablet   Oral   Take 1 mg by mouth 3 (three) times daily as needed. For anxiety         . naproxen (NAPROSYN) 500 MG tablet   Oral   Take 1 tablet (500 mg total) by mouth 2 (two) times daily.   14 tablet   2   . Turmeric 500 MG CAPS   Oral   Take 1 capsule by mouth daily.           BP 148/40  Pulse 69  Temp(Src) 98.1 F (36.7 C) (Oral)  Resp 20  Ht 5\' 6"  (1.676 m)  Wt 450 lb 3 oz (204.204 kg)  BMI 72.7 kg/m2  SpO2 96%  Physical Exam  Constitutional: She is oriented to person, place, and time. She appears well-developed.  HENT:  Head: Normocephalic and atraumatic.  Eyes: Conjunctivae and EOM are  normal. No scleral icterus.  Neck: Neck supple. No thyromegaly present.  Cardiovascular: Normal rate and regular rhythm.  Exam reveals no gallop and no friction rub.   No murmur heard. Pulmonary/Chest: No stridor. She has no wheezes. She has no rales. She exhibits no tenderness.  Abdominal: She exhibits no distension. There is tenderness. There is no rebound.  Mild tender llq  Musculoskeletal: Normal range of motion. She exhibits no edema.  Lymphadenopathy:    She has no cervical adenopathy.  Neurological: She is oriented to person, place, and time. Coordination normal.  Skin: No rash noted. No erythema.  Psychiatric: She has a normal mood and affect. Her behavior is normal.    ED Course  Procedures (including critical care time)  Labs Reviewed  CBC - Abnormal; Notable for the following:    WBC 17.2 (*)    All other components within normal limits  COMPREHENSIVE METABOLIC PANEL - Abnormal; Notable for the following:    Sodium 134 (*)    Glucose, Bld 177 (*)    AST 40 (*)    ALT 36 (*)    GFR calc non Af Amer 85 (*)    All other components within normal limits  URINALYSIS, ROUTINE W REFLEX MICROSCOPIC - Abnormal; Notable for the following:    Specific Gravity, Urine >1.030 (*)    Hgb urine dipstick LARGE (*)    Bilirubin Urine MODERATE (*)    Protein, ur TRACE (*)    Nitrite POSITIVE (*)    All other components within normal limits  GLUCOSE, CAPILLARY - Abnormal; Notable for the following:    Glucose-Capillary 169 (*)    All other components within normal limits  URINE MICROSCOPIC-ADD ON - Abnormal; Notable for the following:    Squamous Epithelial / LPF MANY (*)    Bacteria, UA FEW (*)    All other components within normal limits  LIPASE, BLOOD   Dg Abd Acute W/chest  10/28/2012  *RADIOLOGY REPORT*  Clinical Data: Low abdominal pain with nausea today.  History of diverticulitis and pneumonia.  ACUTE ABDOMEN SERIES (ABDOMEN 2 VIEW & CHEST 1 VIEW)  Comparison: 10/09/2012  and 09/06/2012 chest radiographs.  Abdominal radiographs 06/21/2012.  Findings: The heart size and mediastinal contours are stable.  The superior segment right lower lobe air space disease noted on  the most recent radiographs has resolved.  The lungs are now clear. There is no pleural effusion.  Abdominal radiographs are somewhat limited by body habitus.  The abdomen is nearly gasless with stool throughout the colon.  No distended loops of bowel are identified.  There is no evidence of free intraperitoneal air or suspicious calcification. Cholecystectomy clips and degenerative changes of the lumbar spine and hips ( right greater than left) are noted.  IMPRESSION:  1.  Interval resolution of right lower lobe air space disease. 2.  No acute abdominal findings identified.   Original Report Authenticated By: Carey Bullocks, M.D.      1. Diverticulitis       MDM          Benny Lennert, MD 10/28/12 2042

## 2012-10-29 ENCOUNTER — Inpatient Hospital Stay (HOSPITAL_COMMUNITY): Payer: Medicaid Other

## 2012-10-29 DIAGNOSIS — K5732 Diverticulitis of large intestine without perforation or abscess without bleeding: Secondary | ICD-10-CM

## 2012-10-29 DIAGNOSIS — E876 Hypokalemia: Secondary | ICD-10-CM

## 2012-10-29 LAB — COMPREHENSIVE METABOLIC PANEL
Alkaline Phosphatase: 64 U/L (ref 39–117)
BUN: 13 mg/dL (ref 6–23)
Chloride: 96 mEq/L (ref 96–112)
Creatinine, Ser: 0.76 mg/dL (ref 0.50–1.10)
GFR calc Af Amer: >90 mL/min (ref >90)
Glucose, Bld: 154 mg/dL — ABNORMAL HIGH (ref 70–99)
Potassium: 3.4 mEq/L — ABNORMAL LOW (ref 3.5–5.1)
Total Bilirubin: 1.2 mg/dL (ref 0.3–1.2)

## 2012-10-29 LAB — CBC
Platelets: 223 10*3/uL (ref 150–400)
RDW: 13.9 % (ref 11.5–15.5)
WBC: 10.5 10*3/uL (ref 4.0–10.5)

## 2012-10-29 LAB — TROPONIN I: Troponin I: <0.3 ng/mL (ref <0.30)

## 2012-10-29 MED ORDER — METRONIDAZOLE IN NACL 5-0.79 MG/ML-% IV SOLN
INTRAVENOUS | Status: AC
  Filled 2012-10-29: qty 100

## 2012-10-29 MED ORDER — POTASSIUM CHLORIDE CRYS ER 20 MEQ PO TBCR
40.0000 meq | EXTENDED_RELEASE_TABLET | Freq: Once | ORAL | Status: AC
  Administered 2012-10-29: 40 meq via ORAL
  Filled 2012-10-29: qty 2

## 2012-10-29 NOTE — Progress Notes (Signed)
ANTICOAGULATION CONSULT NOTE - Initial Consult  Pharmacy Consult for Lovenox  Indication: VTE prophylaxis  Allergies  Allergen Reactions  . Nitroglycerin Anxiety  . Dilaudid (Hydromorphone Hcl) Other (See Comments)    Hallucination  . Sulfa Antibiotics Rash   Patient Measurements: Height: 5\' 6"  (167.6 cm) Weight: 462 lb 1.6 oz (209.607 kg) IBW/kg (Calculated) : 59.3  Vital Signs: Temp: 98.8 F (37.1 C) (03/31 0647) Temp src: Oral (03/31 0647) BP: 141/79 mmHg (03/31 0647) Pulse Rate: 85 (03/31 0647)  Labs:  Recent Labs  10/28/12 1753 10/28/12 1813 10/29/12 0345  HGB 12.5  --  11.2*  HCT 37.2  --  33.0*  PLT 263  --  223  CREATININE 0.80  --  0.76  TROPONINI  --  <0.30 <0.30   Estimated Creatinine Clearance: 160.3 ml/min (by C-G formula based on Cr of 0.76).  Medical History: Past Medical History  Diagnosis Date  . Hypertension   . Depression   . Type 2 diabetes mellitus   . Diverticulitis   . Asthma   . Cyst     pt has "cyst" to legs for years, areas drain at time  . Pancreatitis   . Arthritis   . C. difficile colitis 11/08/2011  . Musculoskeletal pain 11/08/2011  . Neuropathic pain 11/10/2011   Medications:  Scheduled:  . albuterol  2.5 mg Nebulization Q6H  . aspirin EC  81 mg Oral Daily  . buPROPion  300 mg Oral q morning - 10a  . busPIRone  15 mg Oral TID  . [COMPLETED] ciprofloxacin  400 mg Intravenous Once  . ciprofloxacin  400 mg Intravenous Q12H  . enoxaparin (LOVENOX) injection  100 mg Subcutaneous Q24H  . escitalopram  20 mg Oral Daily  . gabapentin  100 mg Oral BID  . hydrochlorothiazide  25 mg Oral QPM  . [COMPLETED] metroNIDAZOLE      . metronidazole  500 mg Intravenous Q8H  . naproxen  500 mg Oral BID  . potassium chloride  40 mEq Oral Once  . [COMPLETED] sodium chloride  1,000 mL Intravenous Once  . sodium chloride  3 mL Intravenous Q12H  . [DISCONTINUED] enoxaparin (LOVENOX) injection  40 mg Subcutaneous Q24H  . [COMPLETED]  metronidazole  500 mg Intravenous Once  . [DISCONTINUED] ondansetron  4 mg Intravenous Once    Assessment: 51yo morbidly obese female with good renal fxn admitted for diverticulitis.  Estimated Creatinine Clearance: 160.3 ml/min (by C-G formula based on Cr of 0.76).  Goal of Therapy:  VTE prophylaxis Monitor platelets by anticoagulation protocol: Yes   Plan:  Lovenox 0.5mg /Kg SQ q24hrs  Valrie Hart A 10/29/2012,9:05 AM

## 2012-10-29 NOTE — Progress Notes (Signed)
     Subjective: This lady was admitted yesterday with the medical diverticulitis. Unfortunately, her weight off 462 pounds did not allow CT scan of her abdomen. She does feel slightly better today.           Physical Exam: Blood pressure 141/79, pulse 85, temperature 98.8 F (37.1 C), temperature source Oral, resp. rate 16, height 5\' 6"  (1.676 m), weight 209.607 kg (462 lb 1.6 oz), last menstrual period 10/28/2012, SpO2 96.00%. She looks systemically well. She is not toxic or septic. Her abdomen is soft and tender in the lower abdomen in a generalized fashion. She does not have rebound tenderness and there are no features of an acute abdomen. Heart sounds are present and regularly irregular , corresponding to PVCs. Lung fields are clear. She is alert and orientated.   Investigations:     Basic Metabolic Panel:  Recent Labs  16/10/96 1753 10/29/12 0345  NA 134* 133*  K 3.9 3.4*  CL 97 96  CO2 25 27  GLUCOSE 177* 154*  BUN 16 13  CREATININE 0.80 0.76  CALCIUM 9.6 9.0   Liver Function Tests:  Recent Labs  10/28/12 1753 10/29/12 0345  AST 40* 37  ALT 36* 35  ALKPHOS 69 64  BILITOT 0.9 1.2  PROT 7.0 6.2  ALBUMIN 3.5 3.0*     CBC:  Recent Labs  10/28/12 1753 10/29/12 0345  WBC 17.2* 10.5  HGB 12.5 11.2*  HCT 37.2 33.0*  MCV 84.7 83.3  PLT 263 223    Dg Abd Acute W/chest  10/28/2012  *RADIOLOGY REPORT*  Clinical Data: Low abdominal pain with nausea today.  History of diverticulitis and pneumonia.  ACUTE ABDOMEN SERIES (ABDOMEN 2 VIEW & CHEST 1 VIEW)  Comparison: 10/09/2012 and 09/06/2012 chest radiographs.  Abdominal radiographs 06/21/2012.  Findings: The heart size and mediastinal contours are stable.  The superior segment right lower lobe air space disease noted on the most recent radiographs has resolved.  The lungs are now clear. There is no pleural effusion.  Abdominal radiographs are somewhat limited by body habitus.  The abdomen is nearly gasless  with stool throughout the colon.  No distended loops of bowel are identified.  There is no evidence of free intraperitoneal air or suspicious calcification. Cholecystectomy clips and degenerative changes of the lumbar spine and hips ( right greater than left) are noted.  IMPRESSION:  1.  Interval resolution of right lower lobe air space disease. 2.  No acute abdominal findings identified.   Original Report Authenticated By: Carey Bullocks, M.D.       Medications: I have reviewed the patient's current medications.  Impression: 1. Clinical diverticulitis. 2. Morbid obesity. 3. Hypertension. 4. Possible UTI. 5. Obstructive sleep apnea. 6. Hypokalemia.     Plan: 1. Start a full liquid diet as she is improving and white blood cell count has improved also. 2. Replete potassium. 3. Continue with intravenous ciprofloxacin and metronidazole. At this point in time, I would favor following the patient clinically. If the patient deteriorates in any way, she will need to go to [facility] for CT scan of her abdomen.     LOS: 1 day   Wilson Singer Pager [phone]  10/29/2012, 9:02 AM

## 2012-10-29 NOTE — Care Management Note (Unsigned)
    Page 1 of 1   10/29/2012     3:16:27 PM   CARE MANAGEMENT NOTE 10/29/2012  Patient:  Stacey [name], Stacey [name]   Account Number:  [id number]  Date Initiated:  10/29/2012  Documentation initiated by:  Rosemary Holms  Subjective/Objective Assessment:   [place] and children. states she would like a wheelchair. weight 462lbs and height 5'6''. May not qualify for WC due to weight to high.     Action/Plan:   Anticipated DC Date:  10/30/2012   Anticipated DC Plan:  HOME/SELF CARE      DC Planning Services  CM consult      Choice offered to / List presented to:             Status of service:  In process, will continue to follow Medicare Important Message given?   (If response is "NO", the following Medicare IM given date fields will be blank) Date Medicare IM given:   Date Additional Medicare IM given:    Discharge Disposition:    Per UR Regulation:    If discussed at Long Length of Stay Meetings, dates discussed:    Comments:  10/29/12 Rosemary Holms RN BSN CM

## 2012-10-30 DIAGNOSIS — N390 Urinary tract infection, site not specified: Secondary | ICD-10-CM

## 2012-10-30 LAB — BASIC METABOLIC PANEL
BUN: 8 mg/dL (ref 6–23)
Chloride: 102 mEq/L (ref 96–112)
Creatinine, Ser: 0.69 mg/dL (ref 0.50–1.10)
GFR calc Af Amer: >90 mL/min (ref >90)
GFR calc non Af Amer: >90 mL/min (ref >90)

## 2012-10-30 LAB — URINE CULTURE

## 2012-10-30 LAB — CBC
HCT: 34.6 % — ABNORMAL LOW (ref 36.0–46.0)
MCH: 28.2 pg (ref 26.0–34.0)
MCHC: 32.9 g/dL (ref 30.0–36.0)
MCV: 85.6 fL (ref 78.0–100.0)
RDW: 14.4 % (ref 11.5–15.5)

## 2012-10-30 MED ORDER — CIPROFLOXACIN HCL 500 MG PO TABS: 500.0000 mg | ORAL_TABLET | Freq: Two times a day (BID) | ORAL | Status: DC

## 2012-10-30 MED ORDER — ACETAMINOPHEN 325 MG PO TABS: 650.0000 mg | ORAL_TABLET | Freq: Four times a day (QID) | ORAL | Status: DC | PRN

## 2012-10-30 MED ORDER — METRONIDAZOLE 500 MG PO TABS: 500.0000 mg | ORAL_TABLET | Freq: Three times a day (TID) | ORAL | Status: DC

## 2012-10-30 MED ORDER — HYDROCODONE-ACETAMINOPHEN 5-325 MG PO TABS: 1.0000 | ORAL_TABLET | ORAL | Status: DC | PRN

## 2012-10-30 NOTE — Progress Notes (Signed)
UR Chart Review Completed  

## 2012-10-30 NOTE — Progress Notes (Signed)
Discharge instructions and prescriptions given, verbalized understanding, out in stable condition via w/c with staff. 

## 2012-10-30 NOTE — Discharge Summary (Signed)
Physician Discharge Summary  Patient ID: Stacey [name] MRN: [id number] DOB/AGE: 50/50/1962 50 y.o.  Admit date: 10/28/2012 Discharge date: 10/30/2012  Discharge Diagnoses:  Principal Problem:   LLQ abdominal pain Active Problems:   HTN (hypertension)   Morbid obesity   Depression   PVC's (premature ventricular contractions)   UTI (lower urinary tract infection)   Dyspnea   OSA (obstructive sleep apnea)     Medication List    STOP taking these medications       naproxen 500 MG tablet  Commonly known as:  NAPROSYN     Turmeric 500 MG Caps      TAKE these medications       acetaminophen 325 MG tablet  Commonly known as:  TYLENOL  Take 2 tablets (650 mg total) by mouth every 6 (six) hours as needed for pain or fever.     albuterol 108 (90 BASE) MCG/ACT inhaler  Commonly known as:  PROVENTIL HFA;VENTOLIN HFA  Inhale 2 puffs into the lungs every 4 (four) hours as needed for wheezing or shortness of breath (or coughing).     buPROPion 300 MG 24 hr tablet  Commonly known as:  WELLBUTRIN XL  Take 300 mg by mouth every morning.     busPIRone 15 MG tablet  Commonly known as:  BUSPAR  Take 15 mg by mouth 3 (three) times daily.     ciprofloxacin 500 MG tablet  Commonly known as:  CIPRO  Take 1 tablet (500 mg total) by mouth 2 (two) times daily.     escitalopram 20 MG tablet  Commonly known as:  LEXAPRO  Take 20 mg by mouth daily.     gabapentin 100 MG capsule  Commonly known as:  NEURONTIN  Take 1 capsule (100 mg total) by mouth 2 (two) times daily. FOR BURNING PAIN.     hydrochlorothiazide 25 MG tablet  Commonly known as:  HYDRODIURIL  Take 25 mg by mouth every evening.     HYDROcodone-acetaminophen 5-325 MG per tablet  Commonly known as:  NORCO/VICODIN  Take 1-2 tablets by mouth every 4 (four) hours as needed for pain.     ibuprofen 200 MG tablet  Commonly known as:  ADVIL,MOTRIN  Take 800 mg by mouth every 8 (eight) hours as needed. pain     LORazepam 1  MG tablet  Commonly known as:  ATIVAN  Take 1 mg by mouth 3 (three) times daily as needed. For anxiety     metroNIDAZOLE 500 MG tablet  Commonly known as:  FLAGYL  Take 1 tablet (500 mg total) by mouth 3 (three) times daily.            Discharge Orders   Future Orders Complete By Expires     Activity as tolerated - No restrictions  As directed     Diet - low sodium heart healthy  As directed     Driving Restrictions  As directed     Comments:      No driving while on pain medication       Follow-up Information   Follow up with with your primary care provider In 1 week. (to consider CT scan as scanner weight limit allows )       Disposition: 01-Home or Self Care  Discharged Condition: Stable  Consults:   none  Labs:   Results for orders placed during the hospital encounter of 10/28/12 (from the past 48 hour(s))  CBC     Status: Abnormal   Collection  Time    10/28/12  5:53 PM      Result Value Range   WBC 17.2 (*) 4.0 - 10.5 K/uL   RBC 4.39  3.87 - 5.11 MIL/uL   Hemoglobin 12.5  12.0 - 15.0 g/dL   HCT 91.4  78.2 - 95.6 %   MCV 84.7  78.0 - 100.0 fL   MCH 28.5  26.0 - 34.0 pg   MCHC 33.6  30.0 - 36.0 g/dL   RDW 21.3  08.6 - 57.8 %   Platelets 263  150 - 400 K/uL  COMPREHENSIVE METABOLIC PANEL     Status: Abnormal   Collection Time    10/28/12  5:53 PM      Result Value Range   Sodium 134 (*) 135 - 145 mEq/L   Potassium 3.9  3.5 - 5.1 mEq/L   Chloride 97  96 - 112 mEq/L   CO2 25  19 - 32 mEq/L   Glucose, Bld 177 (*) 70 - 99 mg/dL   BUN 16  6 - 23 mg/dL   Creatinine, Ser 4.69  0.50 - 1.10 mg/dL   Calcium 9.6  8.4 - 62.9 mg/dL   Total Protein 7.0  6.0 - 8.3 g/dL   Albumin 3.5  3.5 - 5.2 g/dL   AST 40 (*) 0 - 37 U/L   ALT 36 (*) 0 - 35 U/L   Alkaline Phosphatase 69  39 - 117 U/L   Total Bilirubin 0.9  0.3 - 1.2 mg/dL   GFR calc non Af Amer 85 (*) >90 mL/min   GFR calc Af Amer >90  >90 mL/min   Comment:            The eGFR has been calculated     using  the CKD EPI equation.     This calculation has not been     validated in all clinical     situations.     eGFR's persistently     <90 mL/min signify     possible Chronic Kidney Disease.  LIPASE, BLOOD     Status: None   Collection Time    10/28/12  5:53 PM      Result Value Range   Lipase 20  11 - 59 U/L  GLUCOSE, CAPILLARY     Status: Abnormal   Collection Time    10/28/12  6:13 PM      Result Value Range   Glucose-Capillary 169 (*) 70 - 99 mg/dL   Comment 1 Documented in Chart    TROPONIN I     Status: None   Collection Time    10/28/12  6:13 PM      Result Value Range   Troponin I <0.30  <0.30 ng/mL   Comment:            Due to the release kinetics of cTnI,     a negative result within the first hours     of the onset of symptoms does not rule out     myocardial infarction with certainty.     If myocardial infarction is still suspected,     repeat the test at appropriate intervals.  URINALYSIS, ROUTINE W REFLEX MICROSCOPIC     Status: Abnormal   Collection Time    10/28/12  6:40 PM      Result Value Range   Color, Urine YELLOW  YELLOW   APPearance CLEAR  CLEAR   Specific Gravity, Urine >1.030 (*) 1.005 - 1.030   pH 5.5  5.0 - 8.0   Glucose, UA NEGATIVE  NEGATIVE mg/dL   Hgb urine dipstick LARGE (*) NEGATIVE   Bilirubin Urine MODERATE (*) NEGATIVE   Ketones, ur NEGATIVE  NEGATIVE mg/dL   Protein, ur TRACE (*) NEGATIVE mg/dL   Urobilinogen, UA 1.0  0.0 - 1.0 mg/dL   Nitrite POSITIVE (*) NEGATIVE   Leukocytes, UA NEGATIVE  NEGATIVE  URINE MICROSCOPIC-ADD ON     Status: Abnormal   Collection Time    10/28/12  6:40 PM      Result Value Range   Squamous Epithelial / LPF MANY (*) RARE   WBC, UA 0-2  <3 WBC/hpf   RBC / HPF 11-20  <3 RBC/hpf   Bacteria, UA FEW (*) RARE  TROPONIN I     Status: None   Collection Time    10/29/12  3:45 AM      Result Value Range   Troponin I <0.30  <0.30 ng/mL   Comment:            Due to the release kinetics of cTnI,     a  negative result within the first hours     of the onset of symptoms does not rule out     myocardial infarction with certainty.     If myocardial infarction is still suspected,     repeat the test at appropriate intervals.  COMPREHENSIVE METABOLIC PANEL     Status: Abnormal   Collection Time    10/29/12  3:45 AM      Result Value Range   Sodium 133 (*) 135 - 145 mEq/L   Potassium 3.4 (*) 3.5 - 5.1 mEq/L   Chloride 96  96 - 112 mEq/L   CO2 27  19 - 32 mEq/L   Glucose, Bld 154 (*) 70 - 99 mg/dL   BUN 13  6 - 23 mg/dL   Creatinine, Ser 4.09  0.50 - 1.10 mg/dL   Calcium 9.0  8.4 - 81.1 mg/dL   Total Protein 6.2  6.0 - 8.3 g/dL   Albumin 3.0 (*) 3.5 - 5.2 g/dL   AST 37  0 - 37 U/L   ALT 35  0 - 35 U/L   Alkaline Phosphatase 64  39 - 117 U/L   Total Bilirubin 1.2  0.3 - 1.2 mg/dL   GFR calc non Af Amer >90  >90 mL/min   GFR calc Af Amer >90  >90 mL/min   Comment:            The eGFR has been calculated     using the CKD EPI equation.     This calculation has not been     validated in all clinical     situations.     eGFR's persistently     <90 mL/min signify     possible Chronic Kidney Disease.  CBC     Status: Abnormal   Collection Time    10/29/12  3:45 AM      Result Value Range   WBC 10.5  4.0 - 10.5 K/uL   RBC 3.96  3.87 - 5.11 MIL/uL   Hemoglobin 11.2 (*) 12.0 - 15.0 g/dL   HCT 91.4 (*) 78.2 - 95.6 %   MCV 83.3  78.0 - 100.0 fL   MCH 28.3  26.0 - 34.0 pg   MCHC 33.9  30.0 - 36.0 g/dL   RDW 21.3  08.6 - 57.8 %   Platelets 223  150 - 400 K/uL  URINE CULTURE  Status: None   Collection Time    10/29/12  4:03 AM      Result Value Range   Specimen Description URINE, CLEAN CATCH     Special Requests NONE     Culture  Setup Time 10/29/2012 04:30     Colony Count >=100,000 COLONIES/ML     Culture STREPTOCOCCUS GROUP G     Report Status 10/30/2012 FINAL    TROPONIN I     Status: None   Collection Time    10/29/12  9:32 AM      Result Value Range   Troponin I  <0.30  <0.30 ng/mL   Comment:            Due to the release kinetics of cTnI,     a negative result within the first hours     of the onset of symptoms does not rule out     myocardial infarction with certainty.     If myocardial infarction is still suspected,     repeat the test at appropriate intervals.  CBC     Status: Abnormal   Collection Time    10/30/12  5:53 AM      Result Value Range   WBC 8.3  4.0 - 10.5 K/uL   RBC 4.04  3.87 - 5.11 MIL/uL   Hemoglobin 11.4 (*) 12.0 - 15.0 g/dL   HCT 08.6 (*) 57.8 - 46.9 %   MCV 85.6  78.0 - 100.0 fL   MCH 28.2  26.0 - 34.0 pg   MCHC 32.9  30.0 - 36.0 g/dL   RDW 62.9  52.8 - 41.3 %   Platelets 144 (*) 150 - 400 K/uL   Comment: DELTA CHECK NOTED  BASIC METABOLIC PANEL     Status: Abnormal   Collection Time    10/30/12  5:53 AM      Result Value Range   Sodium 136  135 - 145 mEq/L   Potassium 3.5  3.5 - 5.1 mEq/L   Chloride 102  96 - 112 mEq/L   CO2 23  19 - 32 mEq/L   Glucose, Bld 141 (*) 70 - 99 mg/dL   BUN 8  6 - 23 mg/dL   Creatinine, Ser 2.44  0.50 - 1.10 mg/dL   Calcium 9.2  8.4 - 01.0 mg/dL   GFR calc non Af Amer >90  >90 mL/min   GFR calc Af Amer >90  >90 mL/min   Comment:            The eGFR has been calculated     using the CKD EPI equation.     This calculation has not been     validated in all clinical     situations.     eGFR's persistently     <90 mL/min signify     possible Chronic Kidney Disease.  GLUCOSE, CAPILLARY     Status: Abnormal   Collection Time    10/30/12  7:21 AM      Result Value Range   Glucose-Capillary 152 (*) 70 - 99 mg/dL   Comment 1 Notify RN      Diagnostics:  Dg Chest 2 View  10/09/2012  *RADIOLOGY REPORT*  Clinical Data: Shortness of breath and cough  CHEST - 2 VIEW  Comparison: September 06, 2012  Findings: There is new airspace consolidation in the superior segment right lower lobe.  Elsewhere, lungs are clear.  Heart is mildly prominent with normal pulmonary vascularity, stable.  No  adenopathy.  No bone lesions.  IMPRESSION: Superior segment right lower lobe airspace consolidation. There is stable cardiac prominence.   Original Report Authenticated By: Bretta Bang, M.D.    US Renal  10/29/2012  *RADIOLOGY REPORT*  Clinical Data: 52 year old female with microscopic hematuria.  RENAL/URINARY TRACT ULTRASOUND COMPLETE  Comparison:  None.  Findings:  Right Kidney:  No hydronephrosis.  Renal length 12.7 cm.  Cortical echotexture within normal limits.  No focal right renal lesion.  Left Kidney:  No hydronephrosis.  Renal length 12.5 cm.  Cortical echotexture within normal limits.  No focal left renal lesion.  Bladder:  Not visible, presumably decompressed.  Other findings:  Generalized increased echogenicity of the visible liver compatible with hepatic steatosis.  IMPRESSION: 1.  Normal sonographic appearance of the kidneys.  Bladder not visible and presumably decompressed. 2.  Hepatic steatosis.   Original Report Authenticated By: Erskine Speed, M.D.    Dg Abd Acute W/chest  10/28/2012  *RADIOLOGY REPORT*  Clinical Data: Low abdominal pain with nausea today.  History of diverticulitis and pneumonia.  ACUTE ABDOMEN SERIES (ABDOMEN 2 VIEW & CHEST 1 VIEW)  Comparison: 10/09/2012 and 09/06/2012 chest radiographs.  Abdominal radiographs 06/21/2012.  Findings: The heart size and mediastinal contours are stable.  The superior segment right lower lobe air space disease noted on the most recent radiographs has resolved.  The lungs are now clear. There is no pleural effusion.  Abdominal radiographs are somewhat limited by body habitus.  The abdomen is nearly gasless with stool throughout the colon.  No distended loops of bowel are identified.  There is no evidence of free intraperitoneal air or suspicious calcification. Cholecystectomy clips and degenerative changes of the lumbar spine and hips ( right greater than left) are noted.  IMPRESSION:  1.  Interval resolution of right lower lobe air space  disease. 2.  No acute abdominal findings identified.   Original Report Authenticated By: Carey Bullocks, M.D.     Full Code   Hospital Course: See H&P for complete admission details. Ms. Ruybal is a pleasant 52 year old extremely obese white female who presented with left lower quadrant pain. She had a leukocytosis. She reportedly has had diverticulitis confirmed by bariatric CT scanner several years ago at [facility]. Her presentation this time was similar to previous episodes. Unfortunately, do to her extreme obesity, a CT scan was unable to be performed. She weighs almost 500 pounds. She was started on Cipro and Flagyl as well as pain medication. Her white count resolved and her symptoms improved. By the time of discharge she was feeling better and felt that she could manage at home. Her primary care provider is at [facility] and I've recommended that she followup with them as soon as possible to consider a CT scan to definitively diagnose this as diverticulitis. She had another similar episode about 6 months ago that was treated empirically with antibiotics and improved. Total time on the day of discharge greater than 30 minutes.  Discharge Exam:  Blood pressure 153/48, pulse 78, temperature 97.3 F (36.3 C), temperature source Oral, resp. rate 20, height 5\' 6"  (1.676 m), weight 209.607 kg (462 lb 1.6 oz), last menstrual period 10/28/2012, SpO2 98.00%.  General: Comfortable. Eating lunch. Lungs clear to auscultation bilaterally without wheeze rhonchi or rales Cardiovascular regular rate rhythm without murmurs gallops rubs Abdomen obese, soft nontender Extremities no edema  Signed: Carnetta Losada L 10/30/2012, 3:19 PM

## 2012-11-13 DIAGNOSIS — L920 Granuloma annulare: Secondary | ICD-10-CM | POA: Insufficient documentation

## 2012-12-06 ENCOUNTER — Emergency Department (HOSPITAL_COMMUNITY)
Admission: EM | Admit: 2012-12-06 | Discharge: 2012-12-06 | Disposition: A | Discharge status: Home / Self Care | Payer: Medicaid Other | Attending: Emergency Medicine | Admitting: Emergency Medicine

## 2012-12-06 ENCOUNTER — Encounter (HOSPITAL_COMMUNITY): Payer: Self-pay | Admitting: Emergency Medicine

## 2012-12-06 DIAGNOSIS — E119 Type 2 diabetes mellitus without complications: Secondary | ICD-10-CM | POA: Insufficient documentation

## 2012-12-06 DIAGNOSIS — Z79899 Other long term (current) drug therapy: Secondary | ICD-10-CM | POA: Insufficient documentation

## 2012-12-06 DIAGNOSIS — L02419 Cutaneous abscess of limb, unspecified: Secondary | ICD-10-CM | POA: Insufficient documentation

## 2012-12-06 DIAGNOSIS — N939 Abnormal uterine and vaginal bleeding, unspecified: Secondary | ICD-10-CM

## 2012-12-06 DIAGNOSIS — M79604 Pain in right leg: Secondary | ICD-10-CM

## 2012-12-06 DIAGNOSIS — N898 Other specified noninflammatory disorders of vagina: Secondary | ICD-10-CM | POA: Insufficient documentation

## 2012-12-06 DIAGNOSIS — J45909 Unspecified asthma, uncomplicated: Secondary | ICD-10-CM | POA: Insufficient documentation

## 2012-12-06 DIAGNOSIS — F329 Major depressive disorder, single episode, unspecified: Secondary | ICD-10-CM | POA: Insufficient documentation

## 2012-12-06 DIAGNOSIS — M79609 Pain in unspecified limb: Secondary | ICD-10-CM | POA: Insufficient documentation

## 2012-12-06 DIAGNOSIS — R5381 Other malaise: Secondary | ICD-10-CM | POA: Insufficient documentation

## 2012-12-06 DIAGNOSIS — L03115 Cellulitis of right lower limb: Secondary | ICD-10-CM

## 2012-12-06 DIAGNOSIS — Z8719 Personal history of other diseases of the digestive system: Secondary | ICD-10-CM | POA: Insufficient documentation

## 2012-12-06 DIAGNOSIS — Z8739 Personal history of other diseases of the musculoskeletal system and connective tissue: Secondary | ICD-10-CM | POA: Insufficient documentation

## 2012-12-06 DIAGNOSIS — Z8619 Personal history of other infectious and parasitic diseases: Secondary | ICD-10-CM | POA: Insufficient documentation

## 2012-12-06 DIAGNOSIS — F3289 Other specified depressive episodes: Secondary | ICD-10-CM | POA: Insufficient documentation

## 2012-12-06 DIAGNOSIS — R5383 Other fatigue: Secondary | ICD-10-CM | POA: Insufficient documentation

## 2012-12-06 DIAGNOSIS — I10 Essential (primary) hypertension: Secondary | ICD-10-CM | POA: Insufficient documentation

## 2012-12-06 LAB — CBC WITH DIFFERENTIAL/PLATELET
Eosinophils Absolute: 0.2 10*3/uL (ref 0.0–0.7)
Eosinophils Relative: 4 % (ref 0–5)
Lymphs Abs: 1.6 10*3/uL (ref 0.7–4.0)
MCH: 28.8 pg (ref 26.0–34.0)
MCV: 84.8 fL (ref 78.0–100.0)
Platelets: 246 10*3/uL (ref 150–400)
RBC: 4 MIL/uL (ref 3.87–5.11)

## 2012-12-06 MED ORDER — CIPROFLOXACIN HCL 500 MG PO TABS: 750.0000 mg | ORAL_TABLET | Freq: Two times a day (BID) | ORAL | Status: DC

## 2012-12-06 MED ORDER — HYDROCODONE-ACETAMINOPHEN 5-325 MG PO TABS: 1.0000 | ORAL_TABLET | ORAL | Status: DC | PRN

## 2012-12-06 MED ORDER — METRONIDAZOLE 500 MG PO TABS: 500.0000 mg | ORAL_TABLET | Freq: Two times a day (BID) | ORAL | Status: DC

## 2012-12-06 MED ORDER — MEDROXYPROGESTERONE ACETATE 10 MG PO TABS: 10.0000 mg | ORAL_TABLET | Freq: Every day | ORAL | Status: DC

## 2012-12-06 MED ORDER — CYCLOBENZAPRINE HCL 10 MG PO TABS: 10.0000 mg | ORAL_TABLET | Freq: Two times a day (BID) | ORAL | Status: DC | PRN

## 2012-12-06 NOTE — ED Provider Notes (Signed)
History     CSN: [id number]  Arrival date & time 12/06/12  1132   First MD Initiated Contact with Patient 12/06/12 1154      Chief Complaint  Patient presents with  . Vaginal Bleeding  . Leg Pain    (Consider location/radiation/quality/duration/timing/severity/associated sxs/prior treatment) Patient is a 52 y.o. female presenting with vaginal bleeding and leg pain. The history is provided by the patient.  Vaginal Bleeding This is a new problem. The current episode started yesterday. The problem occurs constantly. The problem has been unchanged. Associated symptoms include weakness. Pertinent negatives include no abdominal pain, change in bowel habit, chest pain, chills, fever, neck pain, rash or vomiting. She has tried nothing for the symptoms.  Leg Pain Associated symptoms: no back pain, no fever and no neck pain    Stacey [name] is a 52 y.o. female who is morbidly obese at 350 plus pounds. She presents to the ED with vaginal bleeding and with right leg pain. Her period started yesterday but is much heavier than usual. She has not had a pap smear in over 10 years. She has not been sexually active in a "long time". She has a history of cellulitis of the right thigh. She was evaluated at [facility] and treated with IV Vancomycin and then went home with Flagyl and Cipro. This has been an ongoing problem for several months. The area gets some better with antibiotics and then gets tender and red again. She slid off the couch a few days ago and thought the pain may have been from that but has been off antibiotics for over a week so wasn't sure.   Past Medical History  Diagnosis Date  . Hypertension   . Depression   . Type 2 diabetes mellitus   . Diverticulitis   . Asthma   . Cyst     pt has "cyst" to legs for years, areas drain at time  . Pancreatitis   . Arthritis   . C. difficile colitis 11/08/2011  . Musculoskeletal pain 11/08/2011  . Neuropathic pain 11/10/2011    Past  Surgical History  Procedure Laterality Date  . Tonsillectomy    . Cholecystectomy    . Cesarean section    . Nose surgery      History reviewed. No pertinent family history.  History  Substance Use Topics  . Smoking status: Never Smoker   . Smokeless tobacco: Never Used  . Alcohol Use: No    OB History   Grav Para Term Preterm Abortions TAB SAB Ect Mult Living                  Review of Systems  Constitutional: Negative for fever and chills.  HENT: Negative for sneezing and neck pain.   Eyes: Negative for visual disturbance.  Respiratory: Negative for chest tightness.   Cardiovascular: Negative for chest pain and palpitations.  Gastrointestinal: Negative for vomiting, abdominal pain and change in bowel habit.  Genitourinary: Positive for vaginal bleeding.  Musculoskeletal: Negative for back pain.       Right leg pain  Skin: Negative for rash and wound.  Neurological: Positive for weakness and light-headedness (since period has been so heavy).  Psychiatric/Behavioral: The patient is not nervous/anxious.     Allergies  Nitroglycerin; Dilaudid; and Sulfa antibiotics  Home Medications   Current Outpatient Rx  Name  Route  Sig  Dispense  Refill  . acetaminophen (TYLENOL) 325 MG tablet   Oral   Take 2 tablets (  650 mg total) by mouth every 6 (six) hours as needed for pain or fever.         Marland Kitchen albuterol (PROVENTIL HFA;VENTOLIN HFA) 108 (90 BASE) MCG/ACT inhaler   Inhalation   Inhale 2 puffs into the lungs every 4 (four) hours as needed for wheezing or shortness of breath (or coughing).   1 Inhaler   0   . buPROPion (WELLBUTRIN XL) 300 MG 24 hr tablet   Oral   Take 300 mg by mouth daily.          . busPIRone (BUSPAR) 15 MG tablet   Oral   Take 15 mg by mouth 3 (three) times daily.          Marland Kitchen escitalopram (LEXAPRO) 20 MG tablet   Oral   Take 20 mg by mouth daily.           Marland Kitchen gabapentin (NEURONTIN) 100 MG capsule   Oral   Take 100 mg by mouth 3 (three)  times daily as needed (for nerve pain).         . hydrochlorothiazide (HYDRODIURIL) 25 MG tablet   Oral   Take 25 mg by mouth daily.          Marland Kitchen HYDROcodone-acetaminophen (NORCO/VICODIN) 5-325 MG per tablet   Oral   Take 1-2 tablets by mouth every 4 (four) hours as needed for pain.   30 tablet   0   . ibuprofen (ADVIL,MOTRIN) 200 MG tablet   Oral   Take 800 mg by mouth every 8 (eight) hours as needed. pain         . LORazepam (ATIVAN) 1 MG tablet   Oral   Take 1 mg by mouth 3 (three) times daily as needed for anxiety.            BP 124/50  Pulse 70  Temp(Src) 98.5 F (36.9 C) (Oral)  Resp 20  SpO2 100%  Physical Exam  Nursing note and vitals reviewed. Constitutional: She is oriented to person, place, and time.  Morbidly obese W/F  HENT:  Head: Normocephalic and atraumatic.  Eyes: EOM are normal.  Neck: Normal range of motion. Neck supple.  Cardiovascular: Normal rate.   Pulmonary/Chest: Effort normal.  Abdominal: Soft. Bowel sounds are normal. There is no tenderness.  Genitourinary:  External genitalia without lesions. Moderate blood vaginal vault. Unable to visualize the cervix or palpate the uterus due to patient habitus.  Musculoskeletal: Normal range of motion.       Right upper leg: She exhibits tenderness and swelling.       Legs: Skin with increased warmth inner aspect of right thigh. Erythema and tenderness noted. Pedal pulses equal and strong. Adequate circulation, good touch sensation.  Neurological: She is alert and oriented to person, place, and time. No cranial nerve deficit.  Skin: Skin is warm and dry.  Psychiatric: She has a normal mood and affect. Her behavior is normal. Judgment and thought content normal.    ED Course  Procedures (including critical care time)  Labs Reviewed  CBC WITH DIFFERENTIAL - Abnormal; Notable for the following:    Hemoglobin 11.5 (*)    HCT 33.9 (*)    All other components within normal limits    Assessment: 52 y.o. morbidly obese female with abnormal vaginal bleeding and right leg pain   DUB   Cellulitis right thigh  Plan:  Provera, antibiotics   Pain management   Follow up with Dr. Emelda Fear for DUB  MDM  Dr. Adriana Simas  in to examine the patient. He discussed importance of PCP and returning to [facility] for follow up. Also discussed need for GYN follow up. Patient voices understanding. Patient left ambulatory with her family.    Medication List    TAKE these medications       ciprofloxacin 500 MG tablet  Commonly known as:  CIPRO  Take 1.5 tablets (750 mg total) by mouth every 12 (twelve) hours.     cyclobenzaprine 10 MG tablet  Commonly known as:  FLEXERIL  Take 1 tablet (10 mg total) by mouth 2 (two) times daily as needed for muscle spasms.     HYDROcodone-acetaminophen 5-325 MG per tablet  Commonly known as:  NORCO/VICODIN  Take 1 tablet by mouth every 4 (four) hours as needed.     medroxyPROGESTERone 10 MG tablet  Commonly known as:  PROVERA  Take 1 tablet (10 mg total) by mouth daily.     metroNIDAZOLE 500 MG tablet  Commonly known as:  FLAGYL  Take 1 tablet (500 mg total) by mouth 2 (two) times daily.      ASK your doctor about these medications       acetaminophen 325 MG tablet  Commonly known as:  TYLENOL  Take 2 tablets (650 mg total) by mouth every 6 (six) hours as needed for pain or fever.     albuterol 108 (90 BASE) MCG/ACT inhaler  Commonly known as:  PROVENTIL HFA;VENTOLIN HFA  Inhale 2 puffs into the lungs every 4 (four) hours as needed for wheezing or shortness of breath (or coughing).     buPROPion 300 MG 24 hr tablet  Commonly known as:  WELLBUTRIN XL  Take 300 mg by mouth daily.     busPIRone 15 MG tablet  Commonly known as:  BUSPAR  Take 15 mg by mouth 3 (three) times daily.     escitalopram 20 MG tablet  Commonly known as:  LEXAPRO  Take 20 mg by mouth daily.     gabapentin 100 MG capsule  Commonly known as:  NEURONTIN  Take  100 mg by mouth 3 (three) times daily as needed (for nerve pain).     hydrochlorothiazide 25 MG tablet  Commonly known as:  HYDRODIURIL  Take 25 mg by mouth daily.     HYDROcodone-acetaminophen 5-325 MG per tablet  Commonly known as:  NORCO/VICODIN  Take 1-2 tablets by mouth every 4 (four) hours as needed for pain.     ibuprofen 200 MG tablet  Commonly known as:  ADVIL,MOTRIN  Take 800 mg by mouth every 8 (eight) hours as needed. pain     LORazepam 1 MG tablet  Commonly known as:  ATIVAN  Take 1 mg by mouth 3 (three) times daily as needed for anxiety.               [facility] Orlene Och, [place] 12/06/12 [phone]

## 2012-12-06 NOTE — ED Notes (Addendum)
Pt reports that she has been seen for cellulitis of her right leg before at [facility], states the most recent visit was about 2 weeks ago.  States that she was admitted overnight and put on IV antibiotics and discharged on PO antibiotics.  States that she felt the area moving about 1-1/2 weeks ago and her son reports that he saw the area moving.  The area is very difficult to visualize due to morbid obesity and body habitus.  Pt states that she is unable to stand up very long due to a displaced "knee cap" that was caused by the infection in her right upper leg.  States that she has been having increased pain in her right upper leg, right back and right buttock that she feels may be related to the infection that is in her leg.  She does state that she has had "cellulitis" for years in the same spot.

## 2012-12-06 NOTE — ED Notes (Signed)
Pt c/o "heavy" vaginal bleeding. Pt states she is currently on her period and the bleeding "a lot heavier than normal". Pt reports weakness, lightheadedness and intermittent dizziness. Pt also reports right leg pain after a fall that occurred this week.

## 2012-12-09 NOTE — ED Provider Notes (Signed)
Medical screening examination/treatment/procedure(s) were conducted as a shared visit with non-physician practitioner(s) and myself.  I personally evaluated the patient during the encounter.  Physical exam reveals cellulitis of the right medial distal thigh. Patient reports that this is not unusual or new. Will restart antibiotics. Patient's primary care follow up at [facility]  Donnetta Hutching, MD 12/09/12 [phone]

## 2013-01-15 ENCOUNTER — Encounter (HOSPITAL_COMMUNITY): Payer: Self-pay | Admitting: Emergency Medicine

## 2013-01-15 ENCOUNTER — Emergency Department (HOSPITAL_COMMUNITY)
Admission: EM | Admit: 2013-01-15 | Discharge: 2013-01-15 | Disposition: A | Discharge status: Home / Self Care | Payer: Medicaid Other | Attending: Emergency Medicine | Admitting: Emergency Medicine

## 2013-01-15 DIAGNOSIS — G4730 Sleep apnea, unspecified: Secondary | ICD-10-CM | POA: Insufficient documentation

## 2013-01-15 DIAGNOSIS — Z79899 Other long term (current) drug therapy: Secondary | ICD-10-CM | POA: Insufficient documentation

## 2013-01-15 DIAGNOSIS — J45909 Unspecified asthma, uncomplicated: Secondary | ICD-10-CM | POA: Insufficient documentation

## 2013-01-15 DIAGNOSIS — Z8619 Personal history of other infectious and parasitic diseases: Secondary | ICD-10-CM | POA: Insufficient documentation

## 2013-01-15 DIAGNOSIS — R609 Edema, unspecified: Secondary | ICD-10-CM | POA: Insufficient documentation

## 2013-01-15 DIAGNOSIS — R42 Dizziness and giddiness: Secondary | ICD-10-CM

## 2013-01-15 DIAGNOSIS — F3289 Other specified depressive episodes: Secondary | ICD-10-CM | POA: Insufficient documentation

## 2013-01-15 DIAGNOSIS — I10 Essential (primary) hypertension: Secondary | ICD-10-CM | POA: Insufficient documentation

## 2013-01-15 DIAGNOSIS — Z8739 Personal history of other diseases of the musculoskeletal system and connective tissue: Secondary | ICD-10-CM | POA: Insufficient documentation

## 2013-01-15 DIAGNOSIS — E119 Type 2 diabetes mellitus without complications: Secondary | ICD-10-CM | POA: Insufficient documentation

## 2013-01-15 DIAGNOSIS — Z8719 Personal history of other diseases of the digestive system: Secondary | ICD-10-CM | POA: Insufficient documentation

## 2013-01-15 DIAGNOSIS — Z9981 Dependence on supplemental oxygen: Secondary | ICD-10-CM | POA: Insufficient documentation

## 2013-01-15 DIAGNOSIS — F329 Major depressive disorder, single episode, unspecified: Secondary | ICD-10-CM | POA: Insufficient documentation

## 2013-01-15 DIAGNOSIS — Z872 Personal history of diseases of the skin and subcutaneous tissue: Secondary | ICD-10-CM | POA: Insufficient documentation

## 2013-01-15 DIAGNOSIS — Z792 Long term (current) use of antibiotics: Secondary | ICD-10-CM | POA: Insufficient documentation

## 2013-01-15 HISTORY — DX: Sleep apnea, unspecified: G47.30

## 2013-01-15 NOTE — ED Notes (Signed)
Patient requesting to sit in chair and does not want to get into the bed. States "I'll be all right. I'll just sit right here." Ensured wheelchair is in locked position.

## 2013-01-15 NOTE — ED Notes (Signed)
C/o dizziness after taking her night time medicine.  Tonight took her meds together rather than spaced out over a couple of hours.  Is now very sleepy and dizzy.  Called the pharmacist and because she has severe sleep apnea - pharmacist advised her to go to the Emergency Department.   States she is prescribed ativan four times a day - usually only takes twice a day but was very tearful and having a bad day so she has taken a total of three as prescribed.  Only came here because the pharmacist scared her

## 2013-01-15 NOTE — ED Provider Notes (Signed)
History     CSN: [id number]  Arrival date & time 01/15/13  2221   First MD Initiated Contact with Patient 01/15/13 2257      Chief Complaint  Patient presents with  . Dizziness    (Consider location/radiation/quality/duration/timing/severity/associated sxs/prior treatment) HPI HPI Comments: Stacey [name] is a 52 y.o. female who presents to the Emergency Department complaining of mild dizziness after taking her nighttime medicines early. She has had a stressful day and took ativan which is prescribed 4 x day. She took a third ativan along with her night time medicines and began to feel a little lightheaded. She called her pharmacist who told her to come directly to the ER.The patient is not dizzy now. She is awake and oriented, answering all questions correctly. She is not suicidal, homicidal or psychotic. Denies fever, chills, nausea, vomiting, shortness of breath, cough.   Past Medical History  Diagnosis Date  . Hypertension   . Depression   . Type 2 diabetes mellitus   . Diverticulitis   . Asthma   . Cyst     pt has "cyst" to legs for years, areas drain at time  . Pancreatitis   . Arthritis   . C. difficile colitis 11/08/2011  . Musculoskeletal pain 11/08/2011  . Neuropathic pain 11/10/2011  . Sleep apnea with use of continuous positive airway pressure (CPAP)     uses CPAP at night    Past Surgical History  Procedure Laterality Date  . Tonsillectomy    . Cholecystectomy    . Cesarean section    . Nose surgery      No family history on file.  History  Substance Use Topics  . Smoking status: Never Smoker   . Smokeless tobacco: Never Used  . Alcohol Use: No    OB History   Grav Para Term Preterm Abortions TAB SAB Ect Mult Living                  Review of Systems  Constitutional: Negative for fever.       10 Systems reviewed and are negative for acute change except as noted in the HPI.  HENT: Negative for congestion.   Eyes: Negative for discharge and  redness.  Respiratory: Negative for cough and shortness of breath.   Cardiovascular: Negative for chest pain.  Gastrointestinal: Negative for vomiting and abdominal pain.  Musculoskeletal: Negative for back pain.  Skin: Negative for rash.  Neurological: Positive for dizziness. Negative for syncope, numbness and headaches.  Psychiatric/Behavioral:       No behavior change.    Allergies  Nitroglycerin; Dilaudid; and Sulfa antibiotics  Home Medications   Current Outpatient Rx  Name  Route  Sig  Dispense  Refill  . acetaminophen (TYLENOL) 325 MG tablet   Oral   Take 2 tablets (650 mg total) by mouth every 6 (six) hours as needed for pain or fever.         Marland Kitchen albuterol (PROVENTIL HFA;VENTOLIN HFA) 108 (90 BASE) MCG/ACT inhaler   Inhalation   Inhale 2 puffs into the lungs every 4 (four) hours as needed for wheezing or shortness of breath (or coughing).   1 Inhaler   0   . buPROPion (WELLBUTRIN XL) 300 MG 24 hr tablet   Oral   Take 300 mg by mouth daily.          . busPIRone (BUSPAR) 15 MG tablet   Oral   Take 15 mg by mouth 3 (three) times daily.          Marland Kitchen  ciprofloxacin (CIPRO) 500 MG tablet   Oral   Take 1.5 tablets (750 mg total) by mouth every 12 (twelve) hours.   14 tablet   0   . cyclobenzaprine (FLEXERIL) 10 MG tablet   Oral   Take 1 tablet (10 mg total) by mouth 2 (two) times daily as needed for muscle spasms.   15 tablet   0   . escitalopram (LEXAPRO) 20 MG tablet   Oral   Take 20 mg by mouth daily.           Marland Kitchen gabapentin (NEURONTIN) 100 MG capsule   Oral   Take 100 mg by mouth 3 (three) times daily as needed (for nerve pain).         . hydrochlorothiazide (HYDRODIURIL) 25 MG tablet   Oral   Take 25 mg by mouth daily.          Marland Kitchen HYDROcodone-acetaminophen (NORCO/VICODIN) 5-325 MG per tablet   Oral   Take 1-2 tablets by mouth every 4 (four) hours as needed for pain.   30 tablet   0   . HYDROcodone-acetaminophen (NORCO/VICODIN) 5-325 MG per  tablet   Oral   Take 1 tablet by mouth every 4 (four) hours as needed.   15 tablet   0   . ibuprofen (ADVIL,MOTRIN) 200 MG tablet   Oral   Take 800 mg by mouth every 8 (eight) hours as needed. pain         . LORazepam (ATIVAN) 1 MG tablet   Oral   Take 1 mg by mouth 3 (three) times daily as needed for anxiety.          . medroxyPROGESTERone (PROVERA) 10 MG tablet   Oral   Take 1 tablet (10 mg total) by mouth daily.   14 tablet   0   . metroNIDAZOLE (FLAGYL) 500 MG tablet   Oral   Take 1 tablet (500 mg total) by mouth 2 (two) times daily.   14 tablet   0     BP 134/53  Pulse 71  Temp(Src) 98.6 F (37 C) (Oral)  Resp 20  Ht 5\' 6"  (1.676 m)  Wt 462 lb (209.562 kg)  BMI 74.6 kg/m2  SpO2 98%  LMP 12/18/2012  Physical Exam  Nursing note and vitals reviewed. Constitutional: She is oriented to person, place, and time. She appears well-developed and well-nourished.  Awake, alert, morbidly obese  HENT:  Head: Atraumatic.  Eyes: Pupils are equal, round, and reactive to light.  Neck: Normal range of motion. Neck supple.  Cardiovascular: Normal rate and intact distal pulses.   Pulmonary/Chest: Effort normal and breath sounds normal. She exhibits no tenderness.  Abdominal: Soft. Bowel sounds are normal. There is no tenderness. There is no rebound.  Musculoskeletal: She exhibits edema. She exhibits no tenderness.  Baseline ROM, no obvious new focal weakness.  Neurological: She is alert and oriented to person, place, and time. No cranial nerve deficit. Coordination normal.  Mental status and motor strength appears baseline for patient and situation.  Skin: No rash noted.  Psychiatric: She has a normal mood and affect.    ED Course  Procedures (including critical care time)    MDM  Patient took an extra ativan tonight and became lightheaded. Now is alert, awake. She isnot suicidal, homicidal, or psychotic. Will be discharged home. Pt stable in ED with no significant  deterioration in condition.The patient appears reasonably screened and/or stabilized for discharge and I doubt any other medical condition or other  EMC requiring further screening, evaluation, or treatment in the ED at this time prior to discharge.  MDM Reviewed: nursing note and vitals           Nicoletta Dress. Colon Branch, MD 01/15/13 [phone]

## 2013-02-04 ENCOUNTER — Encounter (HOSPITAL_COMMUNITY): Payer: Self-pay | Admitting: *Deleted

## 2013-02-04 ENCOUNTER — Emergency Department (HOSPITAL_COMMUNITY)
Admission: EM | Admit: 2013-02-04 | Discharge: 2013-02-04 | Disposition: A | Discharge status: Home / Self Care | Payer: Medicaid Other | Attending: Emergency Medicine | Admitting: Emergency Medicine

## 2013-02-04 DIAGNOSIS — Z8719 Personal history of other diseases of the digestive system: Secondary | ICD-10-CM | POA: Insufficient documentation

## 2013-02-04 DIAGNOSIS — Z8619 Personal history of other infectious and parasitic diseases: Secondary | ICD-10-CM | POA: Insufficient documentation

## 2013-02-04 DIAGNOSIS — B3789 Other sites of candidiasis: Secondary | ICD-10-CM | POA: Insufficient documentation

## 2013-02-04 DIAGNOSIS — G4733 Obstructive sleep apnea (adult) (pediatric): Secondary | ICD-10-CM | POA: Insufficient documentation

## 2013-02-04 DIAGNOSIS — F3289 Other specified depressive episodes: Secondary | ICD-10-CM | POA: Insufficient documentation

## 2013-02-04 DIAGNOSIS — Z9981 Dependence on supplemental oxygen: Secondary | ICD-10-CM | POA: Insufficient documentation

## 2013-02-04 DIAGNOSIS — I10 Essential (primary) hypertension: Secondary | ICD-10-CM | POA: Insufficient documentation

## 2013-02-04 DIAGNOSIS — Z79899 Other long term (current) drug therapy: Secondary | ICD-10-CM | POA: Insufficient documentation

## 2013-02-04 DIAGNOSIS — E119 Type 2 diabetes mellitus without complications: Secondary | ICD-10-CM | POA: Insufficient documentation

## 2013-02-04 DIAGNOSIS — Z8739 Personal history of other diseases of the musculoskeletal system and connective tissue: Secondary | ICD-10-CM | POA: Insufficient documentation

## 2013-02-04 DIAGNOSIS — F329 Major depressive disorder, single episode, unspecified: Secondary | ICD-10-CM | POA: Insufficient documentation

## 2013-02-04 DIAGNOSIS — J45909 Unspecified asthma, uncomplicated: Secondary | ICD-10-CM | POA: Insufficient documentation

## 2013-02-04 DIAGNOSIS — Z872 Personal history of diseases of the skin and subcutaneous tissue: Secondary | ICD-10-CM | POA: Insufficient documentation

## 2013-02-04 DIAGNOSIS — R21 Rash and other nonspecific skin eruption: Secondary | ICD-10-CM | POA: Insufficient documentation

## 2013-02-04 DIAGNOSIS — IMO0001 Reserved for inherently not codable concepts without codable children: Secondary | ICD-10-CM | POA: Insufficient documentation

## 2013-02-04 MED ORDER — CLOTRIMAZOLE 1 % EX CREA
TOPICAL_CREAM | CUTANEOUS | Status: AC
  Filled 2013-02-04: qty 15

## 2013-02-04 MED ORDER — CLOTRIMAZOLE-BETAMETHASONE 1-0.05 % EX CREA
1.0000 "application " | TOPICAL_CREAM | Freq: Two times a day (BID) | CUTANEOUS | Status: DC
  Administered 2013-02-04: 1 via TOPICAL
  Filled 2013-02-04: qty 15

## 2013-02-04 MED ORDER — CLOTRIMAZOLE-BETAMETHASONE 1-0.05 % EX CREA: TOPICAL_CREAM | CUTANEOUS | Status: DC

## 2013-02-04 NOTE — ED Provider Notes (Signed)
History    This chart was scribed for Benny Lennert, MD, by Frederik Pear, ED scribe. The patient was seen in room APA11/APA11 and the patient's care was started at 2214.   CSN: [id number] Arrival date & time 02/04/13  2147  First MD Initiated Contact with Patient 02/04/13 2214     Chief Complaint  Patient presents with  . Wound Infection   (Consider location/radiation/quality/duration/timing/severity/associated sxs/prior Treatment) Patient is a 52 y.o. female presenting with rash. The history is provided by the patient and medical records. No language interpreter was used.  Rash Pain location:  Generalized Pain severity:  Unable to specify Onset quality:  Unable to specify Progression:  Worsening Chronicity:  New Associated symptoms: no chest pain, no cough, no diarrhea, no fatigue and no hematuria    HPI Comments: Stacey [name] is a 52 y.o. female who presents to the Emergency Department complaining of a yeast infection to her abdomen. She treated the rash at home with [employer] cream and neosporin without relief.    Past Medical History  Diagnosis Date  . Hypertension   . Depression   . Type 2 diabetes mellitus   . Diverticulitis   . Asthma   . Cyst     pt has "cyst" to legs for years, areas drain at time  . Pancreatitis   . Arthritis   . C. difficile colitis 11/08/2011  . Musculoskeletal pain 11/08/2011  . Neuropathic pain 11/10/2011  . Sleep apnea with use of continuous positive airway pressure (CPAP)     uses CPAP at night   Past Surgical History  Procedure Laterality Date  . Tonsillectomy    . Cholecystectomy    . Cesarean section    . Nose surgery     No family history on file. History  Substance Use Topics  . Smoking status: Never Smoker   . Smokeless tobacco: Never Used  . Alcohol Use: No   OB History   Grav Para Term Preterm Abortions TAB SAB Ect Mult Living                 Review of Systems  Constitutional: Negative for appetite change and  fatigue.  HENT: Negative for congestion, sinus pressure and ear discharge.   Eyes: Negative for discharge.  Respiratory: Negative for cough.   Cardiovascular: Negative for chest pain.  Gastrointestinal: Negative for abdominal pain and diarrhea.  Genitourinary: Negative for frequency and hematuria.  Musculoskeletal: Negative for back pain.  Skin: Positive for rash (yeast infection).  Neurological: Negative for seizures and headaches.  Psychiatric/Behavioral: Negative for hallucinations.    Allergies  Nitroglycerin; Dilaudid; and Sulfa antibiotics  Home Medications   Current Outpatient Rx  Name  Route  Sig  Dispense  Refill  . buPROPion (WELLBUTRIN XL) 300 MG 24 hr tablet   Oral   Take 300 mg by mouth daily.          . busPIRone (BUSPAR) 15 MG tablet   Oral   Take 15 mg by mouth 3 (three) times daily.          . cyclobenzaprine (FLEXERIL) 10 MG tablet   Oral   Take 1 tablet (10 mg total) by mouth 2 (two) times daily as needed for muscle spasms.   15 tablet   0   . escitalopram (LEXAPRO) 20 MG tablet   Oral   Take 20 mg by mouth daily.           Marland Kitchen gabapentin (NEURONTIN) 100 MG capsule  Oral   Take 100 mg by mouth 3 (three) times daily as needed (for nerve pain).         . hydrochlorothiazide (HYDRODIURIL) 25 MG tablet   Oral   Take 25 mg by mouth daily.          Marland Kitchen HYDROcodone-acetaminophen (NORCO/VICODIN) 5-325 MG per tablet   Oral   Take 1 tablet by mouth every 4 (four) hours as needed.   15 tablet   0   . ibuprofen (ADVIL,MOTRIN) 200 MG tablet   Oral   Take 800 mg by mouth every 8 (eight) hours as needed. pain         . LORazepam (ATIVAN) 1 MG tablet   Oral   Take 1 mg by mouth 3 (three) times daily as needed for anxiety.          Marland Kitchen albuterol (PROVENTIL HFA;VENTOLIN HFA) 108 (90 BASE) MCG/ACT inhaler   Inhalation   Inhale 2 puffs into the lungs every 4 (four) hours as needed for wheezing or shortness of breath (or coughing).   1 Inhaler    0    BP 154/70  Pulse 76  Temp(Src) 97.8 F (36.6 C) (Oral)  Resp 20  Ht 5\' 6"  (1.676 m)  Wt 439 lb (199.129 kg)  BMI 70.89 kg/m2  SpO2 99%  LMP 01/05/2013 Physical Exam  Nursing note and vitals reviewed. Constitutional: She is oriented to person, place, and time. She appears well-developed.  Morbidly obese.  HENT:  Head: Normocephalic.  Eyes: Conjunctivae and EOM are normal. No scleral icterus.  Neck: Neck supple. No tracheal deviation present. No thyromegaly present.  Pulmonary/Chest: No stridor.  Abdominal: There is no tenderness.  Yeast infection present on abdomen.  Musculoskeletal: Normal range of motion.  Lymphadenopathy:    She has no cervical adenopathy.  Neurological: She is oriented to person, place, and time.  Skin: Skin is warm.  Psychiatric: She has a normal mood and affect. Her behavior is normal.    ED Course  Procedures (including critical care time)  DIAGNOSTIC STUDIES: Oxygen Saturation is 99% on room air, normal by my interpretation.    COORDINATION OF CARE:  22:20- Discussed planned course of treatment with the patient, including Lotrisone, who is agreeable at this time.      Labs Reviewed - No data to display No results found. No diagnosis found.  MDM  The chart was scribed for me under my direct supervision.  I personally performed the history, physical, and medical decision making and all procedures in the evaluation of this patient.Benny Lennert, MD 02/04/13 2226

## 2013-02-04 NOTE — ED Notes (Signed)
States she has a yeast infection under her abdomen

## 2013-02-04 NOTE — ED Notes (Signed)
AC called for Lotrisone cream, medication not in ED pyxis.

## 2013-02-07 ENCOUNTER — Encounter (HOSPITAL_COMMUNITY): Payer: Self-pay | Admitting: Emergency Medicine

## 2013-02-07 ENCOUNTER — Emergency Department (HOSPITAL_COMMUNITY): Payer: Medicaid Other

## 2013-02-07 ENCOUNTER — Inpatient Hospital Stay (HOSPITAL_COMMUNITY)
Admission: EM | Admit: 2013-02-07 | Discharge: 2013-02-09 | DRG: 603 | Disposition: A | Discharge status: Home / Self Care | Payer: Medicaid Other | Attending: Internal Medicine | Admitting: Internal Medicine

## 2013-02-07 DIAGNOSIS — R0600 Dyspnea, unspecified: Secondary | ICD-10-CM

## 2013-02-07 DIAGNOSIS — A0472 Enterocolitis due to Clostridium difficile, not specified as recurrent: Secondary | ICD-10-CM

## 2013-02-07 DIAGNOSIS — D72829 Elevated white blood cell count, unspecified: Secondary | ICD-10-CM

## 2013-02-07 DIAGNOSIS — L0291 Cutaneous abscess, unspecified: Secondary | ICD-10-CM

## 2013-02-07 DIAGNOSIS — K529 Noninfective gastroenteritis and colitis, unspecified: Secondary | ICD-10-CM

## 2013-02-07 DIAGNOSIS — R2241 Localized swelling, mass and lump, right lower limb: Secondary | ICD-10-CM

## 2013-02-07 DIAGNOSIS — F411 Generalized anxiety disorder: Secondary | ICD-10-CM | POA: Diagnosis present

## 2013-02-07 DIAGNOSIS — F329 Major depressive disorder, single episode, unspecified: Secondary | ICD-10-CM

## 2013-02-07 DIAGNOSIS — D1779 Benign lipomatous neoplasm of other sites: Secondary | ICD-10-CM | POA: Diagnosis present

## 2013-02-07 DIAGNOSIS — L0390 Cellulitis, unspecified: Secondary | ICD-10-CM | POA: Diagnosis present

## 2013-02-07 DIAGNOSIS — L02419 Cutaneous abscess of limb, unspecified: Principal | ICD-10-CM | POA: Diagnosis present

## 2013-02-07 DIAGNOSIS — Z6841 Body Mass Index (BMI) 40.0 and over, adult: Secondary | ICD-10-CM

## 2013-02-07 DIAGNOSIS — G589 Mononeuropathy, unspecified: Secondary | ICD-10-CM | POA: Diagnosis present

## 2013-02-07 DIAGNOSIS — R001 Bradycardia, unspecified: Secondary | ICD-10-CM

## 2013-02-07 DIAGNOSIS — G4733 Obstructive sleep apnea (adult) (pediatric): Secondary | ICD-10-CM | POA: Diagnosis present

## 2013-02-07 DIAGNOSIS — F3289 Other specified depressive episodes: Secondary | ICD-10-CM | POA: Diagnosis present

## 2013-02-07 DIAGNOSIS — E876 Hypokalemia: Secondary | ICD-10-CM

## 2013-02-07 DIAGNOSIS — R0789 Other chest pain: Secondary | ICD-10-CM

## 2013-02-07 DIAGNOSIS — I10 Essential (primary) hypertension: Secondary | ICD-10-CM

## 2013-02-07 DIAGNOSIS — F32A Depression, unspecified: Secondary | ICD-10-CM

## 2013-02-07 DIAGNOSIS — R1032 Left lower quadrant pain: Secondary | ICD-10-CM

## 2013-02-07 DIAGNOSIS — R002 Palpitations: Secondary | ICD-10-CM

## 2013-02-07 DIAGNOSIS — Z79899 Other long term (current) drug therapy: Secondary | ICD-10-CM

## 2013-02-07 DIAGNOSIS — I493 Ventricular premature depolarization: Secondary | ICD-10-CM

## 2013-02-07 DIAGNOSIS — M7918 Myalgia, other site: Secondary | ICD-10-CM

## 2013-02-07 DIAGNOSIS — M129 Arthropathy, unspecified: Secondary | ICD-10-CM | POA: Diagnosis present

## 2013-02-07 DIAGNOSIS — N390 Urinary tract infection, site not specified: Secondary | ICD-10-CM

## 2013-02-07 DIAGNOSIS — M792 Neuralgia and neuritis, unspecified: Secondary | ICD-10-CM

## 2013-02-07 DIAGNOSIS — R229 Localized swelling, mass and lump, unspecified: Secondary | ICD-10-CM

## 2013-02-07 DIAGNOSIS — E119 Type 2 diabetes mellitus without complications: Secondary | ICD-10-CM | POA: Diagnosis present

## 2013-02-07 DIAGNOSIS — J45909 Unspecified asthma, uncomplicated: Secondary | ICD-10-CM | POA: Diagnosis present

## 2013-02-07 DIAGNOSIS — R1031 Right lower quadrant pain: Secondary | ICD-10-CM

## 2013-02-07 LAB — BASIC METABOLIC PANEL
BUN: 20 mg/dL (ref 6–23)
Chloride: 103 mEq/L (ref 96–112)
Glucose, Bld: 142 mg/dL — ABNORMAL HIGH (ref 70–99)
Potassium: 3.9 mEq/L (ref 3.5–5.1)

## 2013-02-07 LAB — CBC WITH DIFFERENTIAL/PLATELET
Eosinophils Absolute: 0 10*3/uL (ref 0.0–0.7)
HCT: 36 % (ref 36.0–46.0)
Hemoglobin: 12 g/dL (ref 12.0–15.0)
Lymphs Abs: 2.2 10*3/uL (ref 0.7–4.0)
MCH: 29 pg (ref 26.0–34.0)
Monocytes Absolute: 0.6 10*3/uL (ref 0.1–1.0)
Monocytes Relative: 5 % (ref 3–12)
Neutro Abs: 8.9 10*3/uL — ABNORMAL HIGH (ref 1.7–7.7)
Neutrophils Relative %: 76 % (ref 43–77)
RBC: 4.14 MIL/uL (ref 3.87–5.11)

## 2013-02-07 MED ORDER — ALUM & MAG HYDROXIDE-SIMETH 200-200-20 MG/5ML PO SUSP: 30.0000 mL | Freq: Four times a day (QID) | ORAL | Status: DC | PRN

## 2013-02-07 MED ORDER — CYCLOBENZAPRINE HCL 10 MG PO TABS: 10.0000 mg | ORAL_TABLET | Freq: Two times a day (BID) | ORAL | Status: DC | PRN

## 2013-02-07 MED ORDER — ALBUTEROL SULFATE HFA 108 (90 BASE) MCG/ACT IN AERS: 2.0000 | INHALATION_SPRAY | RESPIRATORY_TRACT | Status: DC | PRN

## 2013-02-07 MED ORDER — DIPHENHYDRAMINE HCL 25 MG PO CAPS
25.0000 mg | ORAL_CAPSULE | Freq: Once | ORAL | Status: AC
  Administered 2013-02-07: 25 mg via ORAL
  Filled 2013-02-07: qty 1

## 2013-02-07 MED ORDER — MORPHINE SULFATE 2 MG/ML IJ SOLN
1.0000 mg | INTRAMUSCULAR | Status: DC | PRN
  Administered 2013-02-08: 1 mg via INTRAVENOUS
  Filled 2013-02-07: qty 1

## 2013-02-07 MED ORDER — DIPHENHYDRAMINE HCL 25 MG PO CAPS: 25.0000 mg | ORAL_CAPSULE | Freq: Four times a day (QID) | ORAL | Status: DC | PRN

## 2013-02-07 MED ORDER — BUPROPION HCL ER (XL) 300 MG PO TB24
300.0000 mg | ORAL_TABLET | Freq: Every day | ORAL | Status: DC
  Administered 2013-02-07 – 2013-02-09 (×3): 300 mg via ORAL
  Filled 2013-02-07 (×5): qty 1

## 2013-02-07 MED ORDER — HYDROCHLOROTHIAZIDE 25 MG PO TABS
25.0000 mg | ORAL_TABLET | Freq: Every day | ORAL | Status: DC
  Administered 2013-02-07 – 2013-02-08 (×2): 25 mg via ORAL
  Filled 2013-02-07 (×3): qty 1

## 2013-02-07 MED ORDER — ONDANSETRON HCL 4 MG PO TABS: 4.0000 mg | ORAL_TABLET | Freq: Four times a day (QID) | ORAL | Status: DC | PRN

## 2013-02-07 MED ORDER — HYDROCODONE-ACETAMINOPHEN 5-325 MG PO TABS
1.0000 | ORAL_TABLET | ORAL | Status: DC | PRN
  Administered 2013-02-08 – 2013-02-09 (×2): 1 via ORAL
  Filled 2013-02-07 (×2): qty 1

## 2013-02-07 MED ORDER — ONDANSETRON HCL 4 MG/2ML IJ SOLN
4.0000 mg | Freq: Once | INTRAMUSCULAR | Status: AC
  Administered 2013-02-07: 4 mg via INTRAVENOUS
  Filled 2013-02-07: qty 2

## 2013-02-07 MED ORDER — ACETAMINOPHEN 325 MG PO TABS: 650.0000 mg | ORAL_TABLET | Freq: Four times a day (QID) | ORAL | Status: DC | PRN

## 2013-02-07 MED ORDER — MORPHINE SULFATE 4 MG/ML IJ SOLN
4.0000 mg | Freq: Once | INTRAMUSCULAR | Status: AC
Start: 2013-02-07 — End: 2013-02-07
  Administered 2013-02-07: 4 mg via INTRAVENOUS
  Filled 2013-02-07: qty 1

## 2013-02-07 MED ORDER — BUSPIRONE HCL 5 MG PO TABS
15.0000 mg | ORAL_TABLET | Freq: Three times a day (TID) | ORAL | Status: DC
  Administered 2013-02-07 – 2013-02-09 (×6): 15 mg via ORAL
  Filled 2013-02-07 (×6): qty 3

## 2013-02-07 MED ORDER — NYSTATIN 100000 UNIT/GM EX POWD
CUTANEOUS | Status: AC
  Filled 2013-02-07: qty 15

## 2013-02-07 MED ORDER — ACETAMINOPHEN 650 MG RE SUPP: 650.0000 mg | Freq: Four times a day (QID) | RECTAL | Status: DC | PRN

## 2013-02-07 MED ORDER — INSULIN ASPART 100 UNIT/ML ~~LOC~~ SOLN
0.0000 [IU] | Freq: Three times a day (TID) | SUBCUTANEOUS | Status: DC
  Administered 2013-02-07: 5 [IU] via SUBCUTANEOUS
  Administered 2013-02-08 – 2013-02-09 (×2): 2 [IU] via SUBCUTANEOUS

## 2013-02-07 MED ORDER — SODIUM CHLORIDE 0.9 % IV SOLN
INTRAVENOUS | Status: AC
  Administered 2013-02-07: 16:00:00 via INTRAVENOUS

## 2013-02-07 MED ORDER — MORPHINE SULFATE 4 MG/ML IJ SOLN
4.0000 mg | Freq: Once | INTRAMUSCULAR | Status: AC
  Administered 2013-02-07: 4 mg via INTRAVENOUS
  Filled 2013-02-07: qty 1

## 2013-02-07 MED ORDER — ESCITALOPRAM OXALATE 10 MG PO TABS
20.0000 mg | ORAL_TABLET | Freq: Every day | ORAL | Status: DC
  Administered 2013-02-07 – 2013-02-09 (×3): 20 mg via ORAL
  Filled 2013-02-07 (×3): qty 2

## 2013-02-07 MED ORDER — LORAZEPAM 1 MG PO TABS
1.0000 mg | ORAL_TABLET | Freq: Four times a day (QID) | ORAL | Status: DC | PRN
  Administered 2013-02-07 – 2013-02-08 (×4): 1 mg via ORAL
  Filled 2013-02-07 (×4): qty 1

## 2013-02-07 MED ORDER — ONDANSETRON HCL 4 MG/2ML IJ SOLN: 4.0000 mg | Freq: Four times a day (QID) | INTRAMUSCULAR | Status: DC | PRN

## 2013-02-07 MED ORDER — GABAPENTIN 100 MG PO CAPS
100.0000 mg | ORAL_CAPSULE | Freq: Every day | ORAL | Status: DC
  Administered 2013-02-07 – 2013-02-09 (×3): 100 mg via ORAL
  Filled 2013-02-07 (×3): qty 1

## 2013-02-07 MED ORDER — NYSTATIN 100000 UNIT/GM EX POWD
Freq: Two times a day (BID) | CUTANEOUS | Status: DC
  Administered 2013-02-08 – 2013-02-09 (×4): via TOPICAL
  Filled 2013-02-07: qty 15

## 2013-02-07 MED ORDER — VANCOMYCIN HCL IN DEXTROSE 1-5 GM/200ML-% IV SOLN
1000.0000 mg | Freq: Three times a day (TID) | INTRAVENOUS | Status: DC
  Administered 2013-02-07 – 2013-02-09 (×6): 1000 mg via INTRAVENOUS
  Filled 2013-02-07 (×12): qty 200

## 2013-02-07 MED ORDER — ENOXAPARIN SODIUM 40 MG/0.4ML ~~LOC~~ SOLN
40.0000 mg | SUBCUTANEOUS | Status: DC
  Administered 2013-02-07 – 2013-02-08 (×2): 40 mg via SUBCUTANEOUS
  Filled 2013-02-07 (×2): qty 0.4

## 2013-02-07 MED ORDER — VANCOMYCIN HCL IN DEXTROSE 1-5 GM/200ML-% IV SOLN
1000.0000 mg | Freq: Once | INTRAVENOUS | Status: AC
  Administered 2013-02-07: 1000 mg via INTRAVENOUS
  Filled 2013-02-07: qty 200

## 2013-02-07 NOTE — ED Provider Notes (Signed)
History    CSN: [id number] Arrival date & time 02/07/13  0800  First MD Initiated Contact with Patient 02/07/13 [id number]     Chief Complaint  Patient presents with  . Recurrent Skin Infections   (Consider location/radiation/quality/duration/timing/severity/associated sxs/prior Treatment) HPI Comments: Stacey [name] is a 52 y.o. Female with worsening swelling,  Redness and pain of her right medial thigh.  She has a history of intermittent episodes of right thigh cellulitis which require occasional hospitalizations for IV antibiotics. Her last flare of this was 3 months ago when she was admitted at [facility].  She has had clear drainage from the skin since today.  She denies nausea or vomiting,  Fevers or chills.  She has used warm compresses and has taken ibuprofen without relief of symptoms.  She was seen here 3 days ago and treated for a yeast infection in her abdominal folds with lotrisone cream which she feels is improving.     The history is provided by the patient.   Past Medical History  Diagnosis Date  . Hypertension   . Depression   . Type 2 diabetes mellitus   . Diverticulitis   . Asthma   . Cyst     pt has "cyst" to legs for years, areas drain at time  . Pancreatitis   . Arthritis   . C. difficile colitis 11/08/2011  . Musculoskeletal pain 11/08/2011  . Neuropathic pain 11/10/2011  . Sleep apnea with use of continuous positive airway pressure (CPAP)     uses CPAP at night   Past Surgical History  Procedure Laterality Date  . Tonsillectomy    . Cholecystectomy    . Cesarean section    . Nose surgery     No family history on file. History  Substance Use Topics  . Smoking status: Never Smoker   . Smokeless tobacco: Never Used  . Alcohol Use: No   OB History   Grav Para Term Preterm Abortions TAB SAB Ect Mult Living                 Review of Systems  Constitutional: Negative for fever.  HENT: Negative for congestion, sore throat and neck pain.   Eyes:  Negative.   Respiratory: Negative for chest tightness and shortness of breath.   Cardiovascular: Negative for chest pain.  Gastrointestinal: Negative for nausea, vomiting and abdominal pain.  Genitourinary: Negative.   Musculoskeletal: Negative for joint swelling and arthralgias.  Skin: Positive for color change and rash. Negative for wound.  Neurological: Negative for dizziness, weakness, light-headedness, numbness and headaches.  Psychiatric/Behavioral: Negative.     Allergies  Nitroglycerin; Dilaudid; and Sulfa antibiotics  Home Medications   Current Outpatient Rx  Name  Route  Sig  Dispense  Refill  . albuterol (PROVENTIL HFA;VENTOLIN HFA) 108 (90 BASE) MCG/ACT inhaler   Inhalation   Inhale 2 puffs into the lungs every 4 (four) hours as needed for wheezing or shortness of breath (or coughing).   1 Inhaler   0   . buPROPion (WELLBUTRIN XL) 300 MG 24 hr tablet   Oral   Take 300 mg by mouth daily.          . busPIRone (BUSPAR) 15 MG tablet   Oral   Take 15 mg by mouth 3 (three) times daily.          . clotrimazole-betamethasone (LOTRISONE) cream      Apply to affected area 2 times daily prn   45 g  0   . cyclobenzaprine (FLEXERIL) 10 MG tablet   Oral   Take 1 tablet (10 mg total) by mouth 2 (two) times daily as needed for muscle spasms.   15 tablet   0   . escitalopram (LEXAPRO) 20 MG tablet   Oral   Take 20 mg by mouth daily.           Marland Kitchen gabapentin (NEURONTIN) 100 MG capsule   Oral   Take 100 mg by mouth daily.          . hydrochlorothiazide (HYDRODIURIL) 25 MG tablet   Oral   Take 25 mg by mouth daily.          Marland Kitchen ibuprofen (ADVIL,MOTRIN) 200 MG tablet   Oral   Take 800 mg by mouth every 8 (eight) hours as needed. pain         . LORazepam (ATIVAN) 1 MG tablet   Oral   Take 1 mg by mouth 4 (four) times daily as needed for anxiety.          Marland Kitchen OVER THE COUNTER MEDICATION   Oral   Take 2 tablets by mouth daily as needed (for diarrhea).  OTC anti-diarrhea pill          BP 144/55  Pulse 52  Temp(Src) 98.7 F (37.1 C)  Resp 18  Ht 5\' 6"  (1.676 m)  Wt 449 lb (203.665 kg)  BMI 72.51 kg/m2  SpO2 97%  LMP 01/05/2013 Physical Exam  Nursing note and vitals reviewed. Constitutional: She appears well-developed and well-nourished.  Morbid obesity.  HENT:  Head: Normocephalic and atraumatic.  Eyes: Conjunctivae are normal.  Neck: Normal range of motion.  Cardiovascular: Normal rate, regular rhythm and intact distal pulses.   Pulses:      Dorsalis pedis pulses are 2+ on the right side, and 2+ on the left side.  Pulmonary/Chest: Effort normal and breath sounds normal. She has no wheezes.  Abdominal: Soft. There is no tenderness.  Musculoskeletal: Normal range of motion.  Neurological: She is alert.  Skin: Skin is warm and dry.  Dry erythema panniculus folds of abdomen.  Brawny induration, erythema and edema entire right medial thigh.  No wounds or abrasions, punctures seen.  No obvious source of infection.  Psychiatric: She has a normal mood and affect.    ED Course  Procedures (including critical care time) Labs Reviewed  CBC WITH DIFFERENTIAL - Abnormal; Notable for the following:    WBC 11.8 (*)    Neutro Abs 8.9 (*)    All other components within normal limits  BASIC METABOLIC PANEL - Abnormal; Notable for the following:    Glucose, Bld 142 (*)    All other components within normal limits   [place] Extrem Low Right Ltd  02/07/2013   *RADIOLOGY REPORT*  Clinical Data: Erythematous region right thigh  ULTRASOUND RIGHT LOWER EXTREMITY LIMITED  Technique:  Ultrasound examination of the region of interest in the right lower extremity was performed.  Comparison:  None  Findings: Sonography of the area concern at the right thigh demonstrates significant subcutaneous edema. Associated skin thickening is present. No discrete mass or focal fluid collection identified.  IMPRESSION: Significant subcutaneous edema at the area of  clinical concern in the right thigh without discrete mass or focal fluid collection.   Original Report Authenticated By: Ulyses Southward, M.D.   1. Cellulitis     MDM  Patients labs and/or radiological studies were viewed and considered during the medical decision making and disposition  process. pts [place] was reviewed with no evidence of abscess at site.  She was started with vancomycin 1 gram IV given while in ed.   Pt also seen by Dr. Deretha Emory,  Plan for admission for continued IV abx.  Pt agreeable with admission.  Burgess Amor, PA-C 02/07/13 1616

## 2013-02-07 NOTE — Progress Notes (Signed)
Patient will be using her home CPAP unit;98% on roomair.

## 2013-02-07 NOTE — Progress Notes (Signed)
ANTIBIOTIC CONSULT NOTE - INITIAL  Pharmacy Consult for Vancomycin Indication: cellulitis  Allergies  Allergen Reactions  . Nitroglycerin Anxiety  . Dilaudid (Hydromorphone Hcl) Other (See Comments)    Hallucination  . Sulfa Antibiotics Rash    Patient Measurements: Height: 5\' 6"  (167.6 cm) Weight: 449 lb (203.665 kg) IBW/kg (Calculated) : 59.3 Adjusted Body Weight: 116Kg  Vital Signs: Temp: 98.7 F (37.1 C) (07/10 0810) BP: 144/55 mmHg (07/10 1427) Pulse Rate: 52 (07/10 1427) Intake/Output from previous day:   Intake/Output from this shift:    Labs:  Recent Labs  02/07/13 1057  WBC 11.8*  HGB 12.0  PLT 272  CREATININE 0.76   Estimated Creatinine Clearance: 157.3 ml/min (by C-G formula based on Cr of 0.76). No results found for this basename: VANCOTROUGH, VANCOPEAK, VANCORANDOM, GENTTROUGH, GENTPEAK, GENTRANDOM, TOBRATROUGH, TOBRAPEAK, TOBRARND, AMIKACINPEAK, AMIKACINTROU, AMIKACIN,  in the last 72 hours   Microbiology: No results found for this or any previous visit (from the past 720 hour(s)).  Medical History: Past Medical History  Diagnosis Date  . Hypertension   . Depression   . Type 2 diabetes mellitus   . Diverticulitis   . Asthma   . Cyst     pt has "cyst" to legs for years, areas drain at time  . Pancreatitis   . Arthritis   . C. difficile colitis 11/08/2011  . Musculoskeletal pain 11/08/2011  . Neuropathic pain 11/10/2011  . Sleep apnea with use of continuous positive airway pressure (CPAP)     uses CPAP at night    Medications:  Scheduled:  . buPROPion  300 mg Oral Daily  . busPIRone  15 mg Oral TID  . enoxaparin (LOVENOX) injection  40 mg Subcutaneous Q24H  . escitalopram  20 mg Oral Daily  . gabapentin  100 mg Oral Daily  . hydrochlorothiazide  25 mg Oral Daily  . insulin aspart  0-15 Units Subcutaneous TID WC   Assessment: 51yo morbidly obese female admitted for cellulitis of inner thigh.  Estimated Creatinine Clearance: 157.3 ml/min  (by C-G formula based on Cr of 0.76).  Goal of Therapy:  Vancomycin trough level 10-15 mcg/ml  Plan:  Vancomycin 1gm IV q8hrs Check trough at steady state Monitor labs, renal fxn, and cultures  Valrie Hart A 02/07/2013,3:15 PM

## 2013-02-07 NOTE — ED Notes (Signed)
Pt c/o cellulitis to right leg with drainage x 2 days.

## 2013-02-07 NOTE — H&P (Signed)
[facility] History and Physical  Stacey [name] ZOX:[id number] DOB: 1961-02-23 DOA: 02/07/2013  Referring physician:  PCP: No PCP Per Patient  Specialists:   Chief Complaint: Pain and drainage to right inner thigh  HPI: Stacey [name] is a 52 y.o. female with a past medical history that includes morbid obesity, hypertension, depression, anxiety, sleep apnea, diverticulitis since to the emergency room with a chief complaint of pain and drainage from right inner thigh area. Information is obtained from the patient. She states that 3 days ago she developed sudden erythema and swelling of her right inner thigh. She states that she also noticed drainage on her clothes. Associated symptoms include some itching, pain, intermittent nausea. She denies any fever, chills, vomiting. She denies any chest pain palpitations shortness of breath headache dizziness. She denies dysuria hematuria frequency or urgency. She denies abdominal pain diarrhea constipation. She does report however that her baseline is frequent loose stools. She denies any recent bright red blood per rectum or melena. Lab work in the emergency room was significant for a white count of 11.8. Ultrasound of her right lower extremity in the thigh region yields significant subcutaneous edema without discrete mass or focal fluid collection. Patient is hemodynamically stable and afebrile in the emergency room. Of note her weight is 203.6 kg. Patient was given pain medicine in the form of morphine as well as Benadryl do to a allergy in the form of itching in the emergency room with good relief. She was also given a dose of vancomycin. Symptoms came on gradually have persisted and worsened. Characterized as mild to moderate .[facility] were asked to admit.   Review of Systems: The patient denies anorexia, fever, weight loss,, vision loss, decreased hearing, hoarseness, chest pain, syncope, dyspnea on exertion, peripheral edema, balance  deficits, hemoptysis, abdominal pain, melena, hematochezia, severe indigestion/heartburn, hematuria, incontinence, genital sores, muscle weakness,  transient blindness, difficulty walking, depression, unusual weight change, abnormal bleeding, enlarged lymph nodes, angioedema, and breast masses.    Past Medical History  Diagnosis Date  . Hypertension   . Depression   . Type 2 diabetes mellitus   . Diverticulitis   . Asthma   . Cyst     pt has "cyst" to legs for years, areas drain at time  . Pancreatitis   . Arthritis   . C. difficile colitis 11/08/2011  . Musculoskeletal pain 11/08/2011  . Neuropathic pain 11/10/2011  . Sleep apnea with use of continuous positive airway pressure (CPAP)     uses CPAP at night   Past Surgical History  Procedure Laterality Date  . Tonsillectomy    . Cholecystectomy    . Cesarean section    . Nose surgery     Social History:  reports that she has never smoked. She has never used smokeless tobacco. She reports that she does not drink alcohol or use illicit drugs. She lives at home with her father and 2 sons. She is independent with ADLs. She is on disability  Allergies  Allergen Reactions  . Nitroglycerin Anxiety  . Dilaudid (Hydromorphone Hcl) Other (See Comments)    Hallucination  . Sulfa Antibiotics Rash    No family history on file. mother is deceased at age 76 she had a past medical history that includes diabetes hypertension and stroke. Her father is alive he 65 he has lung cancer. She has 4 siblings their collective past medical history includes diabetes, ovarian cancer, hypertension, depression and anxiety  Prior to Admission medications   Medication  Sig Start Date End Date Taking? Authorizing Provider  albuterol (PROVENTIL HFA;VENTOLIN HFA) 108 (90 BASE) MCG/ACT inhaler Inhale 2 puffs into the lungs every 4 (four) hours as needed for wheezing or shortness of breath (or coughing). 10/09/12  Yes Dione Booze, MD  buPROPion (WELLBUTRIN XL) 300 MG  24 hr tablet Take 300 mg by mouth daily.    Yes Historical Provider, MD  busPIRone (BUSPAR) 15 MG tablet Take 15 mg by mouth 3 (three) times daily.    Yes Historical Provider, MD  clotrimazole-betamethasone (LOTRISONE) cream Apply to affected area 2 times daily prn 02/04/13  Yes Benny Lennert, MD  cyclobenzaprine (FLEXERIL) 10 MG tablet Take 1 tablet (10 mg total) by mouth 2 (two) times daily as needed for muscle spasms. 12/06/12  Yes Hope Orlene Och, NP  escitalopram (LEXAPRO) 20 MG tablet Take 20 mg by mouth daily.     Yes Historical Provider, MD  gabapentin (NEURONTIN) 100 MG capsule Take 100 mg by mouth daily.    Yes Historical Provider, MD  hydrochlorothiazide (HYDRODIURIL) 25 MG tablet Take 25 mg by mouth daily.    Yes Historical Provider, MD  ibuprofen (ADVIL,MOTRIN) 200 MG tablet Take 800 mg by mouth every 8 (eight) hours as needed. pain   Yes Historical Provider, MD  LORazepam (ATIVAN) 1 MG tablet Take 1 mg by mouth 4 (four) times daily as needed for anxiety.    Yes Historical Provider, MD  OVER THE COUNTER MEDICATION Take 2 tablets by mouth daily as needed (for diarrhea). OTC anti-diarrhea pill   Yes Historical Provider, MD   Physical Exam: Filed Vitals:   02/07/13 0810 02/07/13 1005 02/07/13 1427  BP: 143/54 156/56 144/55  Pulse: 66 56 52  Temp: 98.7 F (37.1 C)    Resp: 18 20 18   Height: 5\' 6"  (1.676 m)    Weight: 203.665 kg (449 lb)    SpO2: 98% 98% 97%     General:  Morbidly obese, somewhat unkept no acute distress  Eyes: PERRLA, EOMI, no scleral icterus  ENT: Ears clear nose without drainage oropharynx without erythema or exudate. Mucous membranes of her mouth are pink and moist.  Neck: Supple full range of motion no lymphadenopathy  Cardiovascular: Heart sounds somewhat distant but regular no murmur gallop or rub trace lower extremity edema  Respiratory: Normal effort breath sounds somewhat distant but clear no rhonchi no wheeze no crackles  Abdomen: Morbidly obese  soft positive bowel sounds nontender to palpation nondistended no guarding  Skin: Right inner thigh with large area of cellulitis induration. Posterior aspect of thigh with 3 small areas of skin sloughing. No odor no drainage  Musculoskeletal: No clubbing cyanosis   Psychiatric: Calm cooperative   Neurologic: cranial nerves II through XII grossly intact  Speech clear facial symmetry  Labs on Admission:  Basic Metabolic Panel:  Recent Labs Lab 02/07/13 1057  NA 139  K 3.9  CL 103  CO2 27  GLUCOSE 142*  BUN 20  CREATININE 0.76  CALCIUM 9.9   Liver Function Tests: No results found for this basename: AST, ALT, ALKPHOS, BILITOT, PROT, ALBUMIN,  in the last 168 hours No results found for this basename: LIPASE, AMYLASE,  in the last 168 hours No results found for this basename: AMMONIA,  in the last 168 hours CBC:  Recent Labs Lab 02/07/13 1057  WBC 11.8*  NEUTROABS 8.9*  HGB 12.0  HCT 36.0  MCV 87.0  PLT 272   Cardiac Enzymes: No results found for this basename:  CKTOTAL, CKMB, CKMBINDEX, TROPONINI,  in the last 168 hours  BNP (last 3 results) No results found for this basename: PROBNP,  in the last 8760 hours CBG: No results found for this basename: GLUCAP,  in the last 168 hours  Radiological Exams on Admission: [place] Extrem Low Right Ltd  02/07/2013   *RADIOLOGY REPORT*  Clinical Data: Erythematous region right thigh  ULTRASOUND RIGHT LOWER EXTREMITY LIMITED  Technique:  Ultrasound examination of the region of interest in the right lower extremity was performed.  Comparison:  None  Findings: Sonography of the area concern at the right thigh demonstrates significant subcutaneous edema. Associated skin thickening is present. No discrete mass or focal fluid collection identified.  IMPRESSION: Significant subcutaneous edema at the area of clinical concern in the right thigh without discrete mass or focal fluid collection.   Original Report Authenticated By: Ulyses Southward, M.D.     EKG:   Assessment/Plan Principal Problem:   Cellulitis: 2 right inner thigh. Patient with history of same. She reports being on antibiotics "on and off for the last several months. Will admit to medical floor. Will continue vancomycin per pharmacy. Early she is afebrile and nontoxic appearing. Will monitor closely. Active Problems: Leukocytosis: Mild. Likely due to #1. See treatment for #1. Will recheck in the a.m. patient is currently afebrile and nontoxic appearing    Obstructive sleep apnea: Patient reports wearing CPAP at home at night. Will request respiratory therapy for same.    Morbid obesity: BMI noted to be 72.6. Will request nutritional consult for weight loss    Anxiety and depression: Stable at baseline. Patient reports that the only doctor she sees with an irregularity is her psychiatrist. The majority of her meds for these problems. Will continue per her home regimen.    HTN (hypertension): Fair control. Patient is on hydrochlorothiazide 25 mg daily. Will continue    Neuropathic pain: History of same. Patient on Neurontin 100 mg by mouth daily will continue  Diabetes. Patient states that she has been diagnosed with type 2 diabetes by some physicians and other physicians have told her she was not. He is currently on no medications and follows no special diet. Will check her hemoglobin A1c provide a car modified diet and use sliding scale insulin. Her glucose on admission was 142    Code Status: Full  Family Communication: sister at bedside  Disposition Plan: home when her   Time spent: 94 minutes  Gwenyth Bender [facility] Pager [phone]  If 7PM-7AM, please contact night-coverage [email or web address] Password [email or web address] 02/07/2013, 3:06 PM Attending: Patient seen and examined. Above  note reviewed. 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 She describes a lump that started approximately 5 years ago and has become larger over the years. I think she  would benefit from this mass being excised, if possible. Agree with intravenous antibiotics. We will surgery to give their opinion.

## 2013-02-07 NOTE — ED Provider Notes (Signed)
Medical screening examination/treatment/procedure(s) were conducted as a shared visit with non-physician practitioner(s) and myself.  I personally evaluated the patient during the encounter  Patient with significant cellulitis to the right inner thigh we did an ultrasound to rule out abscess no evidence of that. Patient's had recurrent cellulitis in this area multiple times. Recommend admission patient treated with vancomycin. Recommend admission until improves.  Shelda Jakes, MD 02/07/13 1330

## 2013-02-08 LAB — HEMOGLOBIN A1C: Hgb A1c MFr Bld: 5.8 % — ABNORMAL HIGH (ref <5.7)

## 2013-02-08 LAB — GLUCOSE, CAPILLARY: Glucose-Capillary: 119 mg/dL — ABNORMAL HIGH (ref 70–99)

## 2013-02-08 LAB — CBC
Hemoglobin: 12.4 g/dL (ref 12.0–15.0)
MCV: 88.4 fL (ref 78.0–100.0)
Platelets: 304 10*3/uL (ref 150–400)
RBC: 4.38 MIL/uL (ref 3.87–5.11)
WBC: 13 10*3/uL — ABNORMAL HIGH (ref 4.0–10.5)

## 2013-02-08 NOTE — Consult Note (Signed)
Reason for Consult: Cellulitis, right thigh Referring Physician: [facility]  SHAYLI ALTEMOSE is an 52 y.o. female.  HPI: Patient is a 52 year old morbidly obese white female who presents with tenderness and drainage from excess subcutaneous tissue in the right inner thigh region. This has been present for some time, though she has had a difficult time getting anybody to attend to it. It is now affecting her ability to fully flex her knee. She has had multiple episodes where the area gets tender and infected. She as mentioned her problem to doctors at [facility], but to no end.  Past Medical History  Diagnosis Date  . Hypertension   . Depression   . Type 2 diabetes mellitus   . Diverticulitis   . Asthma   . Cyst     pt has "cyst" to legs for years, areas drain at time  . Pancreatitis   . Arthritis   . C. difficile colitis 11/08/2011  . Musculoskeletal pain 11/08/2011  . Neuropathic pain 11/10/2011  . Sleep apnea with use of continuous positive airway pressure (CPAP)     uses CPAP at night    Past Surgical History  Procedure Laterality Date  . Tonsillectomy    . Cholecystectomy    . Cesarean section    . Nose surgery      History reviewed. No pertinent family history.  Social History:  reports that she has never smoked. She has never used smokeless tobacco. She reports that she does not drink alcohol or use illicit drugs.  Allergies:  Allergies  Allergen Reactions  . Nitroglycerin Anxiety  . Dilaudid (Hydromorphone Hcl) Other (See Comments)    Hallucination  . Sulfa Antibiotics Rash    Medications: I have reviewed the patient's current medications.  Results for orders placed during the hospital encounter of 02/07/13 (from the past 48 hour(s))  CBC WITH DIFFERENTIAL     Status: Abnormal   Collection Time    02/07/13 10:57 AM      Result Value Range   WBC 11.8 (*) 4.0 - 10.5 K/uL   RBC 4.14  3.87 - 5.11 MIL/uL   Hemoglobin 12.0  12.0 - 15.0 g/dL   HCT 84.1   32.4 - 40.1 %   MCV 87.0  78.0 - 100.0 fL   MCH 29.0  26.0 - 34.0 pg   MCHC 33.3  30.0 - 36.0 g/dL   RDW 02.7  25.3 - 66.4 %   Platelets 272  150 - 400 K/uL   Neutrophils Relative % 76  43 - 77 %   Neutro Abs 8.9 (*) 1.7 - 7.7 K/uL   Lymphocytes Relative 19  12 - 46 %   Lymphs Abs 2.2  0.7 - 4.0 K/uL   Monocytes Relative 5  3 - 12 %   Monocytes Absolute 0.6  0.1 - 1.0 K/uL   Eosinophils Relative 0  0 - 5 %   Eosinophils Absolute 0.0  0.0 - 0.7 K/uL   Basophils Relative 0  0 - 1 %   Basophils Absolute 0.0  0.0 - 0.1 K/uL  BASIC METABOLIC PANEL     Status: Abnormal   Collection Time    02/07/13 10:57 AM      Result Value Range   Sodium 139  135 - 145 mEq/L   Potassium 3.9  3.5 - 5.1 mEq/L   Chloride 103  96 - 112 mEq/L   CO2 27  19 - 32 mEq/L   Glucose, Bld 142 (*) 70 -  99 mg/dL   BUN 20  6 - 23 mg/dL   Creatinine, Ser 1.61  0.50 - 1.10 mg/dL   Calcium 9.9  8.4 - 09.6 mg/dL   GFR calc non Af Amer >90  >90 mL/min   GFR calc Af Amer >90  >90 mL/min   Comment:            The eGFR has been calculated     using the CKD EPI equation.     This calculation has not been     validated in all clinical     situations.     eGFR's persistently     <90 mL/min signify     possible Chronic Kidney Disease.  HEMOGLOBIN A1C     Status: Abnormal   Collection Time    02/07/13 10:57 AM      Result Value Range   Hemoglobin A1C 5.8 (*) <5.7 %   Comment: (NOTE)                                                                               According to the ADA Clinical Practice Recommendations for 2011, when     HbA1c is used as a screening test:      >=6.5%   Diagnostic of Diabetes Mellitus               (if abnormal result is confirmed)     5.7-6.4%   Increased risk of developing Diabetes Mellitus     References:Diagnosis and Classification of Diabetes Mellitus,Diabetes     Care,2011,34(Suppl 1):S62-S69 and Standards of Medical Care in             Diabetes - 2011,Diabetes Care,2011,34 (Suppl  1):S11-S61.   Mean Plasma Glucose 120 (*) <117 mg/dL  GLUCOSE, CAPILLARY     Status: Abnormal   Collection Time    02/07/13  4:22 PM      Result Value Range   Glucose-Capillary 223 (*) 70 - 99 mg/dL  GLUCOSE, CAPILLARY     Status: Abnormal   Collection Time    02/07/13  9:55 PM      Result Value Range   Glucose-Capillary 137 (*) 70 - 99 mg/dL  CBC     Status: Abnormal   Collection Time    02/08/13  5:15 AM      Result Value Range   WBC 13.0 (*) 4.0 - 10.5 K/uL   RBC 4.38  3.87 - 5.11 MIL/uL   Hemoglobin 12.4  12.0 - 15.0 g/dL   HCT 04.5  40.9 - 81.1 %   MCV 88.4  78.0 - 100.0 fL   MCH 28.3  26.0 - 34.0 pg   MCHC 32.0  30.0 - 36.0 g/dL   RDW 91.4  78.2 - 95.6 %   Platelets 304  150 - 400 K/uL  GLUCOSE, CAPILLARY     Status: Abnormal   Collection Time    02/08/13  7:16 AM      Result Value Range   Glucose-Capillary 118 (*) 70 - 99 mg/dL  GLUCOSE, CAPILLARY     Status: Abnormal   Collection Time    02/08/13 11:14 AM      Result Value Range  Glucose-Capillary 119 (*) 70 - 99 mg/dL    [place] Extrem Low Right Ltd  02/07/2013   *RADIOLOGY REPORT*  Clinical Data: Erythematous region right thigh  ULTRASOUND RIGHT LOWER EXTREMITY LIMITED  Technique:  Ultrasound examination of the region of interest in the right lower extremity was performed.  Comparison:  None  Findings: Sonography of the area concern at the right thigh demonstrates significant subcutaneous edema. Associated skin thickening is present. No discrete mass or focal fluid collection identified.  IMPRESSION: Significant subcutaneous edema at the area of clinical concern in the right thigh without discrete mass or focal fluid collection.   Original Report Authenticated By: Ulyses Southward, M.D.    ROS: See chart Blood pressure 144/73, pulse 66, temperature 97.7 F (36.5 C), temperature source Oral, resp. rate 18, height 5\' 6"  (1.676 m), weight 203.665 kg (449 lb), last menstrual period 01/05/2013, SpO2 99.00%. Physical Exam:  Pleasant morbidly obese white female in no acute distress. Extremity: Excessively large, wide based pedunculated soft tissue mass of the right inner thigh and extending posteriorly. The skin is thickened and edematous. She does have some serous drainage present at the base. Erythema is localized to this tissue mass.  Assessment/Plan: Impression: Cellulitis with large tissue mass, right thigh. Suspect that this is a benign subcutaneous mass as it is related to her morbid obesity. Given the extensiveness of the mass, this maybe better handled at a tertiary care center with plastic surgery availability. I suggest that she be referred as an outpatient to [facility] plastic surgery department as they accept Medicaid. This was explained to the patient, who agrees. No acute surgical intervention warranted at this time.  [place] Kirchner A 02/08/2013, 12:37 PM

## 2013-02-08 NOTE — Progress Notes (Signed)
[facility] PROGRESS NOTE  Stacey [name] ZOX:[id number] DOB: September 17, 1960 DOA: 02/07/2013 PCP: No PCP Per Patient  Assessment/Plan: Cellulitis:  right inner thigh on presumed large lipoma. Only slight improvement in erythema. Afebrile. White count trending up somewhat.  Will continue vancomycin per pharmacy. She remains non-toxic appearing. Await surgical consult for any recommendations on possibility of removal.   Active Problems:  Leukocytosis: Mild. Trending up somewhat. Likely due to #1. See treatment for #1. Will recheck in the a.m. patient remains afebrile and nontoxic appearing   Obstructive sleep apnea: Patient reports wearing CPAP at home at night. Has brought machine from home.    Morbid obesity: BMI noted to be 72.6. Will request nutritional consult for weight loss   Anxiety and depression: Stable at baseline. Patient reports that the only doctor she sees with any regularity is her psychiatrist. The majority of her meds are for these problems. Will continue per her home regimen.   HTN (hypertension): Fair control. Patient is on hydrochlorothiazide 25 mg daily. Will continue   Neuropathic pain: History of same. Patient on Neurontin 100 mg by mouth daily will continue   Diabetes. Patient states that she has been diagnosed with type 2 diabetes by some physicians and other physicians have told her she was not. Her glucose on admission was 142. CBG range 118-137. HgA1c 5.8. Continue carb modified diet and SSI.  Code Status: full Family Communication:  Disposition Plan: home when ready   Consultants:  General surgery  Procedures:  none  Antibiotics:  Vancomycin 02/07/13>>>  HPI/Subjective: Watching TV. denies pain/discomfort.   Objective: Filed Vitals:   02/07/13 1543 02/07/13 1906 02/07/13 2157 02/08/13 0617  BP: 147/45 184/53 143/71 144/73  Pulse: 67  61 66  Temp: 97.8 F (36.6 C)  98.2 F (36.8 C) 97.7 F (36.5 C)  TempSrc: Oral  Oral Oral  Resp: 18   18 18   Height:      Weight:      SpO2: 99%  95% 99%    Intake/Output Summary (Last 24 hours) at 02/08/13 1014 Last data filed at 02/08/13 0900  Gross per 24 hour  Intake    805 ml  Output      0 ml  Net    805 ml   Filed Weights   02/07/13 0810  Weight: 203.665 kg (449 lb)    Exam:   General:  Morbidly obese NAD  Cardiovascular: RRR No MGR No trace LE edema  Respiratory: normal effort BS distant but clear to ausculation. No wheeze no rhonchi  Abdomen: morbidly obese. Soft non-distended. +BS non-tender  Musculoskeletal: no clubbing no cyanosis.   Skin: right inner thigh with large growth ?lipoma with erythema,induration and warmth. Small amount clear drainage. No odor   Data Reviewed: Basic Metabolic Panel:  Recent Labs Lab 02/07/13 1057  NA 139  K 3.9  CL 103  CO2 27  GLUCOSE 142*  BUN 20  CREATININE 0.76  CALCIUM 9.9   Liver Function Tests: No results found for this basename: AST, ALT, ALKPHOS, BILITOT, PROT, ALBUMIN,  in the last 168 hours No results found for this basename: LIPASE, AMYLASE,  in the last 168 hours No results found for this basename: AMMONIA,  in the last 168 hours CBC:  Recent Labs Lab 02/07/13 1057 02/08/13 0515  WBC 11.8* 13.0*  NEUTROABS 8.9*  --   HGB 12.0 12.4  HCT 36.0 38.7  MCV 87.0 88.4  PLT 272 304   Cardiac Enzymes: No results found for this  basename: CKTOTAL, CKMB, CKMBINDEX, TROPONINI,  in the last 168 hours BNP (last 3 results) No results found for this basename: PROBNP,  in the last 8760 hours CBG:  Recent Labs Lab 02/07/13 1622 02/07/13 2155 02/08/13 0716  GLUCAP 223* 137* 118*    No results found for this or any previous visit (from the past 240 hour(s)).   Studies: [place] Extrem Low Right Ltd  02/07/2013   *RADIOLOGY REPORT*  Clinical Data: Erythematous region right thigh  ULTRASOUND RIGHT LOWER EXTREMITY LIMITED  Technique:  Ultrasound examination of the region of interest in the right lower extremity  was performed.  Comparison:  None  Findings: Sonography of the area concern at the right thigh demonstrates significant subcutaneous edema. Associated skin thickening is present. No discrete mass or focal fluid collection identified.  IMPRESSION: Significant subcutaneous edema at the area of clinical concern in the right thigh without discrete mass or focal fluid collection.   Original Report Authenticated By: Ulyses Southward, M.D.    Scheduled Meds: . buPROPion  300 mg Oral Daily  . busPIRone  15 mg Oral TID  . enoxaparin (LOVENOX) injection  40 mg Subcutaneous Q24H  . escitalopram  20 mg Oral Daily  . gabapentin  100 mg Oral Daily  . hydrochlorothiazide  25 mg Oral Daily  . insulin aspart  0-15 Units Subcutaneous TID WC  . nystatin   Topical BID  . vancomycin  1,000 mg Intravenous Q8H   Continuous Infusions:   Principal Problem:   Cellulitis Active Problems:   Morbid obesity   Anxiety and depression   HTN (hypertension)   Neuropathic pain   Leukocytosis   Obstructive sleep apnea    Time spent: 30 minutes    [facility] M  [facility] Pager [phone]. If 7PM-7AM, please contact night-coverage at [email or web address], password [facility] 02/08/2013, 10:14 AM  LOS: 1 day   Attending: Patient seen and examined. Cellulitis appears to be improving. Continue with intravenous vancomycin. Await further surgical opinion/recommendations. I would think that she probably needs to go to a tertiary care center if surgery would be recommended.

## 2013-02-08 NOTE — Care Management Note (Unsigned)
    Page 1 of 1   02/08/2013     2:20:22 PM   CARE MANAGEMENT NOTE 02/08/2013  Patient:  Stacey [name], Stacey [name]   Account Number:  [id number]  Date Initiated:  02/08/2013  Documentation initiated by:  Rosemary Holms  Subjective/Objective Assessment:   Pt admitted from home with her stepfather and sons. States her sister assist her daily with ADL. Pt requested a WC. [facility] notified.     Action/Plan:   Anticipated DC Date:  02/10/2013   Anticipated DC Plan:  HOME/SELF CARE      DC Planning Services  CM consult      Choice offered to / List presented to:     DME arranged  [facility] - MANUAL      DME agency  [facility]        Status of service:  In process, will continue to follow Medicare Important Message given?   (If response is "NO", the following Medicare IM given date fields will be blank) Date Medicare IM given:   Date Additional Medicare IM given:    Discharge Disposition:    Per UR Regulation:    If discussed at Long Length of Stay Meetings, dates discussed:    Comments:  02/08/13 1400 Azel Gumina Leanord Hawking RN BSN CM

## 2013-02-08 NOTE — Progress Notes (Signed)
INITIAL NUTRITION ASSESSMENT  DOCUMENTATION CODES Per approved criteria  -Extreme Obesity Class III   INTERVENTION: Provided diet education as noted below and encouraged her to follow-up with outpatient RD after discharge for ongoing nutrition counseling to promote wt loss and reduce disease risk  NUTRITION DIAGNOSIS: Obestiy related to excessive caloric intake and decreased physical activity as evidenced by pt reported limited mobility and BMI= 72.5 .   Goal: Pt will schedule a follow up appointment with Altamese Cabal, RD before her discharge.  Monitor:  Pt progression of care, po intake, labs.  Reason for Assessment: Consult for weight loss education  52 y.o. female  Admitting Dx: Cellulitis  ASSESSMENT: Pt is extremely obese and limited mobility. Lives with her stepdad and 2 children. Stepfather shops and prepares food for the household. She reports variable meal pattern usually consumes 2 meals per day.   Pt HgbA1C has improved over past year.  Results for KARYN, BRULL (MRN [id number]) as of 02/08/2013 07:30  Ref. Range 11/07/2011 10:47 02/07/2013 10:57  Hemoglobin A1C Latest Range: <5.7 % 6.6 (H) 5.8 (H)   RD provided "Healthy Eating" and "CHO Counting" handouts. Discussed different food groups and their effects on blood sugar, emphasizing fresh fruits, non-starchy vegetables, limiting high fat foods and practicing portion control. Provided list of carbohydrates and recommended serving sizes of common foods.  Discussed importance of controlled and consistent carbohydrate intake throughout the day. Provided examples of ways to balance meals/snacks and encouraged intake of high-fiber, whole grain complex carbohydrates. Teach back method used.  Expect limited compliance.   Height: Ht Readings from Last 1 Encounters:  02/07/13 5\' 6"  (1.676 m)    Weight: Wt Readings from Last 1 Encounters:  02/07/13 449 lb (203.665 kg)    Ideal Body Weight: 130# (59 kg)  % Ideal  Body Weight: 345%  Wt Readings from Last 10 Encounters:  02/07/13 449 lb (203.665 kg)  02/04/13 439 lb (199.129 kg)  01/15/13 462 lb (209.562 kg)  10/28/12 462 lb 1.6 oz (209.607 kg)  10/09/12 449 lb (203.665 kg)  09/06/12 455 lb 1.6 oz (206.432 kg)  06/23/12 451 lb 15.1 oz (205 kg)  11/09/11 475 lb 11.2 oz (215.776 kg)  02/16/11 469 lb 3 oz (212.822 kg)    Usual Body Weight: 450-460#  % Usual Body Weight: 100%  BMI:  Body mass index is 72.51 kg/(m^2).Extreme obesity Class III   Estimated Nutritional Needs: Kcal:  Protein:  Fluid:   Skin: abrasions to right thigh  Diet Order: Carb Control  EDUCATION NEEDS: -Education needs addressed   Intake/Output Summary (Last 24 hours) at 02/08/13 0727 Last data filed at 02/07/13 1834  Gross per 24 hour  Intake    105 ml  Output      0 ml  Net    105 ml    Last BM: 02/07/13  Labs:   Recent Labs Lab 02/07/13 1057  NA 139  K 3.9  CL 103  CO2 27  BUN 20  CREATININE 0.76  CALCIUM 9.9  GLUCOSE 142*    CBG (last 3)   Recent Labs  02/07/13 1622 02/07/13 2155  GLUCAP 223* 137*    Scheduled Meds: . buPROPion  300 mg Oral Daily  . busPIRone  15 mg Oral TID  . enoxaparin (LOVENOX) injection  40 mg Subcutaneous Q24H  . escitalopram  20 mg Oral Daily  . gabapentin  100 mg Oral Daily  . hydrochlorothiazide  25 mg Oral Daily  . insulin aspart  0-15  Units Subcutaneous TID WC  . nystatin   Topical BID  . vancomycin  1,000 mg Intravenous Q8H    Continuous Infusions:   Past Medical History  Diagnosis Date  . Hypertension   . Depression   . Type 2 diabetes mellitus   . Diverticulitis   . Asthma   . Cyst     pt has "cyst" to legs for years, areas drain at time  . Pancreatitis   . Arthritis   . C. difficile colitis 11/08/2011  . Musculoskeletal pain 11/08/2011  . Neuropathic pain 11/10/2011  . Sleep apnea with use of continuous positive airway pressure (CPAP)     uses CPAP at night    Past Surgical History   Procedure Laterality Date  . Tonsillectomy    . Cholecystectomy    . Cesarean section    . Nose surgery      Royann Shivers MS,RD,LDN,CSG Office: #[phone] Pager: [phone]

## 2013-02-08 NOTE — Progress Notes (Signed)
Utilization Review Complete  

## 2013-02-09 LAB — BASIC METABOLIC PANEL
BUN: 23 mg/dL (ref 6–23)
Creatinine, Ser: 1.07 mg/dL (ref 0.50–1.10)
GFR calc Af Amer: 69 mL/min — ABNORMAL LOW (ref >90)
GFR calc non Af Amer: 60 mL/min — ABNORMAL LOW (ref >90)

## 2013-02-09 LAB — GLUCOSE, CAPILLARY

## 2013-02-09 LAB — VANCOMYCIN, TROUGH: Vancomycin Tr: 14.4 ug/mL (ref 10.0–20.0)

## 2013-02-09 MED ORDER — HYDROCHLOROTHIAZIDE 25 MG PO TABS: 25.0000 mg | ORAL_TABLET | Freq: Every day | ORAL | Status: DC

## 2013-02-09 MED ORDER — LEVOFLOXACIN 750 MG PO TABS: 750.0000 mg | ORAL_TABLET | Freq: Every day | ORAL | Status: AC

## 2013-02-09 MED ORDER — ENOXAPARIN SODIUM 100 MG/ML ~~LOC~~ SOLN: 100.0000 mg | SUBCUTANEOUS | Status: DC

## 2013-02-09 MED ORDER — HYDROCODONE-ACETAMINOPHEN 5-325 MG PO TABS: 1.0000 | ORAL_TABLET | ORAL | Status: DC | PRN

## 2013-02-09 NOTE — Progress Notes (Signed)
ANTIBIOTIC CONSULT NOTE  Pharmacy Consult for Vancomycin Indication: cellulitis  Allergies  Allergen Reactions  . Nitroglycerin Anxiety  . Dilaudid (Hydromorphone Hcl) Other (See Comments)    Hallucination  . Sulfa Antibiotics Rash    Patient Measurements: Height: 5\' 6"  (167.6 cm) Weight: 449 lb (203.665 kg) IBW/kg (Calculated) : 59.3 Adjusted Body Weight: 116Kg  Vital Signs: Temp: 97.6 F (36.4 C) (07/12 0503) Temp src: Oral (07/12 0503) BP: 149/73 mmHg (07/12 0503) Pulse Rate: 69 (07/12 0715) Intake/Output from previous day: 07/11 0701 - 07/12 0700 In: 1900 [P.O.:1900] Out: -  Intake/Output from this shift:    Labs:  Recent Labs  02/07/13 1057 02/08/13 0515 02/09/13 0100  WBC 11.8* 13.0*  --   HGB 12.0 12.4  --   PLT 272 304  --   CREATININE 0.76  --  1.07   Estimated Creatinine Clearance: 117.6 ml/min (by C-G formula based on Cr of 1.07).  Recent Labs  02/09/13 0100  VANCOTROUGH 14.4     Microbiology: No results found for this or any previous visit (from the past 720 hour(s)).  Medical History: Past Medical History  Diagnosis Date  . Hypertension   . Depression   . Type 2 diabetes mellitus   . Diverticulitis   . Asthma   . Cyst     pt has "cyst" to legs for years, areas drain at time  . Pancreatitis   . Arthritis   . C. difficile colitis 11/08/2011  . Musculoskeletal pain 11/08/2011  . Neuropathic pain 11/10/2011  . Sleep apnea with use of continuous positive airway pressure (CPAP)     uses CPAP at night    Medications:  Scheduled:  . buPROPion  300 mg Oral Daily  . busPIRone  15 mg Oral TID  . enoxaparin (LOVENOX) injection  40 mg Subcutaneous Q24H  . escitalopram  20 mg Oral Daily  . gabapentin  100 mg Oral Daily  . hydrochlorothiazide  25 mg Oral Daily  . insulin aspart  0-15 Units Subcutaneous TID WC  . nystatin   Topical BID  . vancomycin  1,000 mg Intravenous Q8H   Assessment: 52yo morbidly obese female admitted for cellulitis  of inner thigh.  Estimated Creatinine Clearance: 117.6 ml/min (by C-G formula based on Cr of 1.07). Vancomycin trough at goal today  Goal of Therapy:  Vancomycin trough level 10-15 mcg/ml  Plan:  Continue Vancomycin 1gm IV q8hrs Check trough next week if Vancomycin continues Monitor labs, renal fxn, and cultures  Raquel James, Romelo Sciandra Bennett 02/09/2013,8:12 AM

## 2013-02-09 NOTE — Discharge Summary (Signed)
Physician Discharge Summary  Stacey [name]:[id number] DOB: [birth year] DOA: 02/07/2013  PCP: No PCP Per Patient  Admit date: 02/07/2013 Discharge date: 02/09/2013  Time spent: Greater than 30 minutes  Recommendations for Outpatient Follow-up:  1. Follow with PCP in the community. Name and phone number will be given by nursing staff prior to discharge. 2. Patient needs referral to plastic surgery at [facility] for excision of right leg mass, which probably is a large lipoma.  Discharge Diagnoses:  1. Right mass cellulitis. 2. Right thigh mass,? Large lipoma. 3. Hypertension. 4. Morbid obesity.   Discharge Condition: Stable and improving.  Diet recommendation: Low glycemic index nutrition.  Filed Weights   02/07/13 0810  Weight: 203.665 kg (449 lb)    History of present illness:  This 52 year old lady presented to the hospital with symptoms of pain and drainage around the right inner thigh. Please see initial history as outlined below: HPI: Stacey [name] is a 52 y.o. female with a past medical history that includes morbid obesity, hypertension, depression, anxiety, sleep apnea, diverticulitis since to the emergency room with a chief complaint of pain and drainage from right inner thigh area. Information is obtained from the patient. She states that 3 days ago she developed sudden erythema and swelling of her right inner thigh. She states that she also noticed drainage on her clothes. Associated symptoms include some itching, pain, intermittent nausea. She denies any fever, chills, vomiting. She denies any chest pain palpitations shortness of breath headache dizziness. She denies dysuria hematuria frequency or urgency. She denies abdominal pain diarrhea constipation. She does report however that her baseline is frequent loose stools. She denies any recent bright red blood per rectum or melena. Lab work in the emergency room was significant for a white count of 11.8. Ultrasound  of her right lower extremity in the thigh region yields significant subcutaneous edema without discrete mass or focal fluid collection. Patient is hemodynamically stable and afebrile in the emergency room. Of note her weight is 203.6 kg. Patient was given pain medicine in the form of morphine as well as Benadryl do to a allergy in the form of itching in the emergency room with good relief. She was also given a dose of vancomycin. Symptoms came on gradually have persisted and worsened. Characterized as mild to moderate .[facility] were asked to admit.  Hospital Course:  The patient was admitted and started on intravenous vancomycin. The cellulitis appears to affect an appendage from the right thigh. The etiology of this mass was unclear. She was seen by surgery, Dr. 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 The patient tells [place] that she has had this for the last 4-5 years and it has been growing. Dr. Lovell Sheehan felt that the patient would benefit from excision of this mass under the care of plastic surgery at [facility]. This can be done as a referral from outpatient primary care physician's office. Her cellulitis has improved. She is clinically stable without any evidence of sepsis and is stable for discharge with a course of oral antibiotics for another 10 days or so. She does not have a primary care physician in the community and we will find her 1 prior to discharge. She is to call the primary care physician and followup with him/her, especially for referral to [facility], plastic surgery department.  Procedures:  None.   Consultations:  Surgery, Dr. Lovell Sheehan.  Discharge Exam: Filed Vitals:   02/08/13 1114 02/08/13 2104  02/09/13 0503 02/09/13 0715  BP: 170/91 154/80 149/73   Pulse: 75 64 60 69  Temp: 98.8 F (37.1 C) 98.4 F (36.9 C) 97.6 F (36.4 C)   TempSrc: Oral Oral Oral   Resp: 18 20 20    Height:      Weight:      SpO2: 97% 96% 98% 96%     General: She looks systemically well. She is morbidly obese. Cardiovascular: Heart sounds are present without murmurs or added sounds. Respiratory: Lung fields are clear. The right inner thigh mass cellulitis has improved, still some redness present.  Discharge Instructions  Discharge Orders   Future Orders Complete By Expires     Diet - low sodium heart healthy  As directed     Increase activity slowly  As directed         Medication List    STOP taking these medications       ibuprofen 200 MG tablet  Commonly known as:  ADVIL,MOTRIN      TAKE these medications       albuterol 108 (90 BASE) MCG/ACT inhaler  Commonly known as:  PROVENTIL HFA;VENTOLIN HFA  Inhale 2 puffs into the lungs every 4 (four) hours as needed for wheezing or shortness of breath (or coughing).     buPROPion 300 MG 24 hr tablet  Commonly known as:  WELLBUTRIN XL  Take 300 mg by mouth daily.     busPIRone 15 MG tablet  Commonly known as:  BUSPAR  Take 15 mg by mouth 3 (three) times daily.     clotrimazole-betamethasone cream  Commonly known as:  LOTRISONE  Apply to affected area 2 times daily prn     cyclobenzaprine 10 MG tablet  Commonly known as:  FLEXERIL  Take 1 tablet (10 mg total) by mouth 2 (two) times daily as needed for muscle spasms.     escitalopram 20 MG tablet  Commonly known as:  LEXAPRO  Take 20 mg by mouth daily.     gabapentin 100 MG capsule  Commonly known as:  NEURONTIN  Take 100 mg by mouth daily.     hydrochlorothiazide 25 MG tablet  Commonly known as:  HYDRODIURIL  Take 25 mg by mouth daily.     HYDROcodone-acetaminophen 5-325 MG per tablet  Commonly known as:  NORCO/VICODIN  Take 1-2 tablets by mouth every 4 (four) hours as needed.     levofloxacin 750 MG tablet  Commonly known as:  LEVAQUIN  Take 1 tablet (750 mg total) by mouth daily.     LORazepam 1 MG tablet  Commonly known as:  ATIVAN  Take 1 mg by mouth 4 (four) times daily as needed for anxiety.      OVER THE COUNTER MEDICATION  Take 2 tablets by mouth daily as needed (for diarrhea). OTC anti-diarrhea pill       Allergies  Allergen Reactions  . Nitroglycerin Anxiety  . Dilaudid (Hydromorphone Hcl) Other (See Comments)    Hallucination  . Sulfa Antibiotics Rash      The results of significant diagnostics from this hospitalization (including imaging, microbiology, ancillary and laboratory) are listed below for reference.    Significant Diagnostic Studies: [place] Extrem Low Right Ltd  02/07/2013   *RADIOLOGY REPORT*  Clinical Data: Erythematous region right thigh  ULTRASOUND RIGHT LOWER EXTREMITY LIMITED  Technique:  Ultrasound examination of the region of interest in the right lower extremity was performed.  Comparison:  None  Findings: Sonography of the area concern at the  right thigh demonstrates significant subcutaneous edema. Associated skin thickening is present. No discrete mass or focal fluid collection identified.  IMPRESSION: Significant subcutaneous edema at the area of clinical concern in the right thigh without discrete mass or focal fluid collection.   Original Report Authenticated By: Ulyses Southward, M.D.      Labs: Basic Metabolic Panel:  Recent Labs Lab 02/07/13 1057 02/09/13 0100  NA 139 136  K 3.9 3.9  CL 103 98  CO2 27 31  GLUCOSE 142* 131*  BUN 20 23  CREATININE 0.76 1.07  CALCIUM 9.9 9.6       CBC:  Recent Labs Lab 02/07/13 1057 02/08/13 0515  WBC 11.8* 13.0*  NEUTROABS 8.9*  --   HGB 12.0 12.4  HCT 36.0 38.7  MCV 87.0 88.4  PLT 272 304   Cardiac Enzymes: No results found for this basename: CKTOTAL, CKMB, CKMBINDEX, TROPONINI,  in the last 168 hours BNP: BNP (last 3 results) No results found for this basename: PROBNP,  in the last 8760 hours CBG:  Recent Labs Lab 02/08/13 0716 02/08/13 1114 02/08/13 1609 02/08/13 2201 02/09/13 0730  GLUCAP 118* 119* 140* 121* 101*       Signed:  Jaquelyne Firkus C  [facility] 02/09/2013, 11:17 AM

## 2013-02-09 NOTE — Plan of Care (Signed)
Problem: Discharge Progression Outcomes Goal: Other Discharge Outcomes/Goals Outcome: Completed/Met Date Met:  02/09/13 Pt will call Dr Renard Matter for follow up

## 2013-02-09 NOTE — Progress Notes (Signed)
Patient was on Lovenox 40 mg SQ every 24 hours for DVT prophylaxis  Patient renal function; CrCl > 30 ml/min Patient weight:  203.7 kg Patient BMI > 30  Lovenox changed to 100 mg SQ every 24 hours (0.5 mg/kg for BMI > 30 and CrCl > 30 per hospital policy  [facility] 02/09/2013

## 2013-02-17 ENCOUNTER — Encounter (HOSPITAL_COMMUNITY): Payer: Self-pay | Admitting: Emergency Medicine

## 2013-02-17 ENCOUNTER — Observation Stay (HOSPITAL_COMMUNITY)
Admission: EM | Admit: 2013-02-17 | Discharge: 2013-02-18 | Disposition: A | Discharge status: Home / Self Care | Payer: Medicaid Other | Attending: Internal Medicine | Admitting: Internal Medicine

## 2013-02-17 ENCOUNTER — Emergency Department (HOSPITAL_COMMUNITY): Payer: Medicaid Other

## 2013-02-17 DIAGNOSIS — R0600 Dyspnea, unspecified: Secondary | ICD-10-CM

## 2013-02-17 DIAGNOSIS — I10 Essential (primary) hypertension: Secondary | ICD-10-CM | POA: Diagnosis present

## 2013-02-17 DIAGNOSIS — R002 Palpitations: Secondary | ICD-10-CM

## 2013-02-17 DIAGNOSIS — E119 Type 2 diabetes mellitus without complications: Secondary | ICD-10-CM | POA: Diagnosis present

## 2013-02-17 DIAGNOSIS — R001 Bradycardia, unspecified: Secondary | ICD-10-CM

## 2013-02-17 DIAGNOSIS — D649 Anemia, unspecified: Secondary | ICD-10-CM

## 2013-02-17 DIAGNOSIS — R0789 Other chest pain: Secondary | ICD-10-CM

## 2013-02-17 DIAGNOSIS — R1031 Right lower quadrant pain: Secondary | ICD-10-CM

## 2013-02-17 DIAGNOSIS — L03115 Cellulitis of right lower limb: Secondary | ICD-10-CM | POA: Diagnosis present

## 2013-02-17 DIAGNOSIS — F419 Anxiety disorder, unspecified: Secondary | ICD-10-CM

## 2013-02-17 DIAGNOSIS — M792 Neuralgia and neuritis, unspecified: Secondary | ICD-10-CM

## 2013-02-17 DIAGNOSIS — K529 Noninfective gastroenteritis and colitis, unspecified: Secondary | ICD-10-CM

## 2013-02-17 DIAGNOSIS — G4733 Obstructive sleep apnea (adult) (pediatric): Secondary | ICD-10-CM | POA: Diagnosis present

## 2013-02-17 DIAGNOSIS — N390 Urinary tract infection, site not specified: Secondary | ICD-10-CM

## 2013-02-17 DIAGNOSIS — M7918 Myalgia, other site: Secondary | ICD-10-CM

## 2013-02-17 DIAGNOSIS — D1723 Benign lipomatous neoplasm of skin and subcutaneous tissue of right leg: Secondary | ICD-10-CM | POA: Diagnosis present

## 2013-02-17 DIAGNOSIS — E876 Hypokalemia: Secondary | ICD-10-CM | POA: Diagnosis present

## 2013-02-17 DIAGNOSIS — D1739 Benign lipomatous neoplasm of skin and subcutaneous tissue of other sites: Secondary | ICD-10-CM

## 2013-02-17 DIAGNOSIS — A0472 Enterocolitis due to Clostridium difficile, not specified as recurrent: Secondary | ICD-10-CM

## 2013-02-17 DIAGNOSIS — D72829 Elevated white blood cell count, unspecified: Secondary | ICD-10-CM

## 2013-02-17 DIAGNOSIS — R1032 Left lower quadrant pain: Secondary | ICD-10-CM

## 2013-02-17 DIAGNOSIS — L02419 Cutaneous abscess of limb, unspecified: Principal | ICD-10-CM | POA: Insufficient documentation

## 2013-02-17 DIAGNOSIS — F32A Depression, unspecified: Secondary | ICD-10-CM

## 2013-02-17 DIAGNOSIS — I493 Ventricular premature depolarization: Secondary | ICD-10-CM

## 2013-02-17 DIAGNOSIS — F329 Major depressive disorder, single episode, unspecified: Secondary | ICD-10-CM

## 2013-02-17 HISTORY — DX: Benign lipomatous neoplasm of skin and subcutaneous tissue of right leg: D17.23

## 2013-02-17 LAB — BASIC METABOLIC PANEL
CO2: 31 mEq/L (ref 19–32)
Chloride: 101 mEq/L (ref 96–112)
Creatinine, Ser: 0.75 mg/dL (ref 0.50–1.10)
Potassium: 3.4 mEq/L — ABNORMAL LOW (ref 3.5–5.1)

## 2013-02-17 LAB — CBC WITH DIFFERENTIAL/PLATELET
Basophils Absolute: 0 10*3/uL (ref 0.0–0.1)
HCT: 35.6 % — ABNORMAL LOW (ref 36.0–46.0)
Hemoglobin: 11.8 g/dL — ABNORMAL LOW (ref 12.0–15.0)
Lymphocytes Relative: 23 % (ref 12–46)
Monocytes Absolute: 0.5 10*3/uL (ref 0.1–1.0)
Monocytes Relative: 6 % (ref 3–12)
Neutro Abs: 5.3 10*3/uL (ref 1.7–7.7)
RDW: 13.3 % (ref 11.5–15.5)
WBC: 7.8 10*3/uL (ref 4.0–10.5)

## 2013-02-17 LAB — GLUCOSE, CAPILLARY: Glucose-Capillary: 111 mg/dL — ABNORMAL HIGH (ref 70–99)

## 2013-02-17 MED ORDER — HYDROCHLOROTHIAZIDE 25 MG PO TABS
25.0000 mg | ORAL_TABLET | Freq: Every day | ORAL | Status: DC
  Administered 2013-02-17 – 2013-02-18 (×2): 25 mg via ORAL
  Filled 2013-02-17 (×5): qty 1

## 2013-02-17 MED ORDER — ALUM & MAG HYDROXIDE-SIMETH 200-200-20 MG/5ML PO SUSP: 30.0000 mL | Freq: Four times a day (QID) | ORAL | Status: DC | PRN

## 2013-02-17 MED ORDER — ALBUTEROL SULFATE HFA 108 (90 BASE) MCG/ACT IN AERS: 2.0000 | INHALATION_SPRAY | RESPIRATORY_TRACT | Status: DC | PRN

## 2013-02-17 MED ORDER — LORAZEPAM 1 MG PO TABS
1.0000 mg | ORAL_TABLET | Freq: Four times a day (QID) | ORAL | Status: DC | PRN
  Administered 2013-02-17: 1 mg via ORAL
  Filled 2013-02-17: qty 1

## 2013-02-17 MED ORDER — ESCITALOPRAM OXALATE 10 MG PO TABS
20.0000 mg | ORAL_TABLET | Freq: Every day | ORAL | Status: DC
  Administered 2013-02-18: 20 mg via ORAL
  Filled 2013-02-17 (×4): qty 2

## 2013-02-17 MED ORDER — ENOXAPARIN SODIUM 100 MG/ML ~~LOC~~ SOLN
100.0000 mg | SUBCUTANEOUS | Status: DC
  Administered 2013-02-17: 100 mg via SUBCUTANEOUS
  Filled 2013-02-17: qty 1

## 2013-02-17 MED ORDER — ONDANSETRON HCL 4 MG PO TABS: 4.0000 mg | ORAL_TABLET | Freq: Four times a day (QID) | ORAL | Status: DC | PRN

## 2013-02-17 MED ORDER — DOCUSATE SODIUM 100 MG PO CAPS
100.0000 mg | ORAL_CAPSULE | Freq: Two times a day (BID) | ORAL | Status: DC
  Administered 2013-02-18: 100 mg via ORAL
  Filled 2013-02-17 (×6): qty 1

## 2013-02-17 MED ORDER — CLOTRIMAZOLE-BETAMETHASONE 1-0.05 % EX CREA
1.0000 "application " | TOPICAL_CREAM | Freq: Two times a day (BID) | CUTANEOUS | Status: DC
  Administered 2013-02-17 – 2013-02-18 (×2): 1 via TOPICAL
  Filled 2013-02-17 (×3): qty 15

## 2013-02-17 MED ORDER — SODIUM CHLORIDE 0.9 % IJ SOLN
3.0000 mL | Freq: Two times a day (BID) | INTRAMUSCULAR | Status: DC
  Administered 2013-02-17 – 2013-02-18 (×2): 3 mL via INTRAVENOUS

## 2013-02-17 MED ORDER — HYDROCODONE-ACETAMINOPHEN 5-325 MG PO TABS
1.0000 | ORAL_TABLET | ORAL | Status: DC | PRN
Start: 2013-02-17 — End: 2013-02-18
  Administered 2013-02-18: 2 via ORAL
  Filled 2013-02-17: qty 2

## 2013-02-17 MED ORDER — VANCOMYCIN HCL IN DEXTROSE 1-5 GM/200ML-% IV SOLN
1000.0000 mg | Freq: Three times a day (TID) | INTRAVENOUS | Status: DC
  Administered 2013-02-17 – 2013-02-18 (×2): 1000 mg via INTRAVENOUS
  Filled 2013-02-17 (×5): qty 200

## 2013-02-17 MED ORDER — INSULIN ASPART 100 UNIT/ML ~~LOC~~ SOLN: 0.0000 [IU] | Freq: Three times a day (TID) | SUBCUTANEOUS | Status: DC

## 2013-02-17 MED ORDER — BUSPIRONE HCL 5 MG PO TABS
ORAL_TABLET | ORAL | Status: AC
  Filled 2013-02-17: qty 3

## 2013-02-17 MED ORDER — ONDANSETRON HCL 4 MG/2ML IJ SOLN: 4.0000 mg | Freq: Four times a day (QID) | INTRAMUSCULAR | Status: DC | PRN

## 2013-02-17 MED ORDER — ACETAMINOPHEN 325 MG PO TABS
650.0000 mg | ORAL_TABLET | Freq: Four times a day (QID) | ORAL | Status: DC | PRN
  Administered 2013-02-17: 650 mg via ORAL
  Filled 2013-02-17: qty 2

## 2013-02-17 MED ORDER — POTASSIUM CHLORIDE CRYS ER 20 MEQ PO TBCR
30.0000 meq | EXTENDED_RELEASE_TABLET | Freq: Two times a day (BID) | ORAL | Status: AC
  Administered 2013-02-17 – 2013-02-18 (×2): 30 meq via ORAL
  Filled 2013-02-17 (×2): qty 1

## 2013-02-17 MED ORDER — GUAIFENESIN-DM 100-10 MG/5ML PO SYRP: 5.0000 mL | ORAL_SOLUTION | ORAL | Status: DC | PRN

## 2013-02-17 MED ORDER — BUSPIRONE HCL 5 MG PO TABS
15.0000 mg | ORAL_TABLET | Freq: Three times a day (TID) | ORAL | Status: DC
  Administered 2013-02-18 (×2): 15 mg via ORAL
  Filled 2013-02-17: qty 3

## 2013-02-17 MED ORDER — ENOXAPARIN SODIUM 40 MG/0.4ML ~~LOC~~ SOLN
40.0000 mg | SUBCUTANEOUS | Status: DC
  Filled 2013-02-17: qty 0.4

## 2013-02-17 MED ORDER — VANCOMYCIN HCL IN DEXTROSE 1-5 GM/200ML-% IV SOLN
INTRAVENOUS | Status: AC
  Filled 2013-02-17: qty 400

## 2013-02-17 MED ORDER — INSULIN ASPART 100 UNIT/ML ~~LOC~~ SOLN: 0.0000 [IU] | Freq: Every day | SUBCUTANEOUS | Status: DC

## 2013-02-17 MED ORDER — ACETAMINOPHEN 650 MG RE SUPP: 650.0000 mg | Freq: Four times a day (QID) | RECTAL | Status: DC | PRN

## 2013-02-17 MED ORDER — CYCLOBENZAPRINE HCL 10 MG PO TABS: 10.0000 mg | ORAL_TABLET | Freq: Two times a day (BID) | ORAL | Status: DC | PRN

## 2013-02-17 MED ORDER — LOPERAMIDE HCL 2 MG PO CAPS: 2.0000 mg | ORAL_CAPSULE | Freq: Four times a day (QID) | ORAL | Status: DC | PRN

## 2013-02-17 MED ORDER — BUPROPION HCL ER (XL) 150 MG PO TB24
300.0000 mg | ORAL_TABLET | Freq: Every day | ORAL | Status: DC
  Administered 2013-02-18: 300 mg via ORAL
  Filled 2013-02-17 (×5): qty 2

## 2013-02-17 MED ORDER — VANCOMYCIN HCL IN DEXTROSE 1-5 GM/200ML-% IV SOLN
1000.0000 mg | Freq: Once | INTRAVENOUS | Status: AC
  Administered 2013-02-17: 1000 mg via INTRAVENOUS
  Filled 2013-02-17: qty 200

## 2013-02-17 NOTE — ED Notes (Signed)
Pt with recent admission for cellulitis of leg, states it was better and but now is getting worse with redness and weeping per pt, pt admits to not taking antibiotic for it due to med Levaquin was making her vomit, attempted to get in contact with MD to have it changed but was unable to make contact; redness noted to right upper inner thigh noted; pt also c/o SOB while sitting and watching tv today

## 2013-02-17 NOTE — ED Notes (Signed)
Pt admitted to not taking her "BP pill" HCTZ for a week

## 2013-02-17 NOTE — H&P (Signed)
[facility] History and Physical  [name]:[id number] DOB: 1960/10/22 DOA: 02/17/2013  Referring physician: Dr. Clarene Duke PCP: No PCP Per Patient  Specialists: None  Chief Complaint: Right leg redness.  HPI: Stacey [name] is a 52 y.o. female with a history significant for diet-controlled diabetes mellitus, morbid obesity, and large right leg lipoma. She was recently discharged from the hospital on 02/09/2013 for treatment of right lower extremity (right leg lipoma) cellulitis. She was discharged on Levaquin, however, she took one dose and it caused nausea and vomiting. She was also referred to a local primary care physician following discharge, but was told that the physician did not take Medicaid. The plan was for her to be referred to plastic surgery at [facility] for a possible excision of the right leg mass. Over the past few days, her sister noticed that the right leg lipoma was become more red and excoriated. It also became warm. Part of the skin "broke open and drained" for couple days. There was no obvious pus but it was  a fluidlike material. The leg/lipoma is a little uncomfortable, but she denies outright pain. She denies subjective fever and chills, but she has had generalized malaise. She has had occasional shortness of breath with ambulation. She denies chest pain, palpitations, cough, abdominal pain, nausea, vomiting, diarrhea, or numbness and tingling in her feet.  In the emergency department, she was afebrile and hemodynamically stable. Her blood pressure ranged from 139-182 systolically. Her chest x-ray reveals pulmonary vascular congestion. Her lab data are significant for a normal WBC, hemoglobin 11.8, glucose of 152 and potassium of 3.4. She is being admitted for further evaluation and management.    Review of Systems: As above in history present illness, otherwise negative.   Past Medical History  Diagnosis Date  . Hypertension   . Depression   .  Type 2 diabetes mellitus   . Diverticulitis   . Asthma   . Cyst     pt has "cyst" to legs for years, areas drain at time  . Pancreatitis   . Arthritis   . C. difficile colitis 11/08/2011  . Musculoskeletal pain 11/08/2011  . Neuropathic pain 11/10/2011  . Sleep apnea with use of continuous positive airway pressure (CPAP)     uses CPAP at night  . Benign lipomatous neoplasm of skin, subcu of right leg 02/17/2013   Past Surgical History  Procedure Laterality Date  . Tonsillectomy    . Cholecystectomy    . Cesarean section    . Nose surgery     Social History: She is divorced. She lives with her son and step-father. She is unemployed. She denies tobacco, alcohol, and illicit drug use.    Allergies  Allergen Reactions  . Nitroglycerin Anxiety  . Dilaudid (Hydromorphone Hcl) Other (See Comments)    Hallucination  . Levaquin (Levofloxacin In D5w) Nausea And Vomiting  . Sulfa Antibiotics Rash    Family history: Her father died of lung cancer. She does not know the exact cause of her mother's death, but she had a history of C. difficile colitis and strokes.  Prior to Admission medications   Medication Sig Start Date End Date Taking? Authorizing Provider  albuterol (PROVENTIL HFA;VENTOLIN HFA) 108 (90 BASE) MCG/ACT inhaler Inhale 2 puffs into the lungs every 4 (four) hours as needed for wheezing or shortness of breath (or coughing). 10/09/12  Yes Dione Booze, MD  buPROPion (WELLBUTRIN XL) 300 MG 24 hr tablet Take 300 mg by mouth daily.  Yes Historical Provider, MD  busPIRone (BUSPAR) 15 MG tablet Take 15 mg by mouth 3 (three) times daily.    Yes Historical Provider, MD  clotrimazole-betamethasone (LOTRISONE) cream Apply 1 application topically 2 (two) times daily. 02/04/13  Yes Benny Lennert, MD  cyclobenzaprine (FLEXERIL) 10 MG tablet Take 1 tablet (10 mg total) by mouth 2 (two) times daily as needed for muscle spasms. 12/06/12  Yes Hope Orlene Och, NP  escitalopram (LEXAPRO) 20 MG tablet Take  20 mg by mouth daily.     Yes Historical Provider, MD  hydrochlorothiazide (HYDRODIURIL) 25 MG tablet Take 25 mg by mouth daily.    Yes Historical Provider, MD  HYDROcodone-acetaminophen (NORCO/VICODIN) 5-325 MG per tablet Take 1-2 tablets by mouth every 4 (four) hours as needed. 02/09/13  Yes Nimish Normajean Glasgow, MD  ibuprofen (ADVIL,MOTRIN) 200 MG tablet Take 800 mg by mouth every 6 (six) hours as needed for pain.   Yes Historical Provider, MD  loperamide (IMODIUM) 2 MG capsule Take 2 mg by mouth 4 (four) times daily as needed for diarrhea or loose stools.   Yes Historical Provider, MD  LORazepam (ATIVAN) 1 MG tablet Take 1 mg by mouth 4 (four) times daily as needed for anxiety.    Yes Historical Provider, MD   Physical Exam: Filed Vitals:   02/17/13 1641 02/17/13 1700 02/17/13 1710 02/17/13 1715  BP: 139/54 161/65 161/65   Pulse: 70 64 63 67  Temp:      TempSrc:      Resp: 20 17  11   SpO2: 99% 99% 99% 98%     General:  Morbidly obese 52 year old Caucasian woman laying in bed, in no acute distress.  Eyes: Pupils equal, round, and reactive to light. Extra ocular movements are intact. Conjunctivae are clear. Sclerae are white.  ENT: Oropharynx reveals moist mucous membranes. No posterior exudates or erythema.  Neck: Obese, supple, no adenopathy, no thyromegaly.  Cardiovascular: S1, S2, with no murmurs rubs or gallops.  Respiratory: Clear to auscultation bilaterally.  Abdomen: Morbidly obese, positive bowel sounds, soft, nontender, nondistended.  Skin: Large right inner thigh mass likely lipoma with mild erythema and moderate induration. Small area of excoriation and erythema on the posterior portion of the mass. Nontender. No warmth. No drainage.  Musculoskeletal: No acute hot red joints.  Psychiatric: Pleasant affect. Alert and oriented. Speech is clear.  Neurologic: Cranial nerves II through XII are intact.  Labs on Admission:  Basic Metabolic Panel:  Recent Labs Lab  02/17/13 1428  NA 138  K 3.4*  CL 101  CO2 31  GLUCOSE 152*  BUN 16  CREATININE 0.75  CALCIUM 10.0   Liver Function Tests: No results found for this basename: AST, ALT, ALKPHOS, BILITOT, PROT, ALBUMIN,  in the last 168 hours No results found for this basename: LIPASE, AMYLASE,  in the last 168 hours No results found for this basename: AMMONIA,  in the last 168 hours CBC:  Recent Labs Lab 02/17/13 1428  WBC 7.8  NEUTROABS 5.3  HGB 11.8*  HCT 35.6*  MCV 87.0  PLT 243   Cardiac Enzymes: No results found for this basename: CKTOTAL, CKMB, CKMBINDEX, TROPONINI,  in the last 168 hours  BNP (last 3 results) No results found for this basename: PROBNP,  in the last 8760 hours CBG:  Recent Labs Lab 02/17/13 1415  GLUCAP 143*    Radiological Exams on Admission: Dg Chest Portable 1 View  02/17/2013   *RADIOLOGY REPORT*  Clinical Data: Short of breath  and dizzy  PORTABLE CHEST - 1 VIEW  Comparison: Chest radiograph 10/28/2012  Findings: Normal cardiac silhouette.  Mild central venous pulmonary congestion.  No infiltrate or effusion.  No pneumothorax  IMPRESSION: Mild central venous pulmonary congestion.   Original Report Authenticated By: Genevive Bi, M.D.    EKG: Normal sinus rhythm with first degree AV block, heart rate 70 beats per minute.  Assessment/Plan Principal Problem:   Cellulitis of right leg Active Problems:   Benign lipomatous neoplasm of skin, subcu of right leg   Morbid obesity   HTN (hypertension)   Hypokalemia   Obstructive sleep apnea   Diabetes mellitus type II, controlled   1. Cellulitis of the right thigh lipoma. The patient failed outpatient treatment as she could not tolerate Levaquin. She is neither febrile nor does she have a high white blood cell count. She does not appear to be toxic appearing. I believe it is reasonable to give her IV antibiotics over the next 24 hours. She has done well on Cipro and Flagyl in the past. These are the  medications that she would likely be discharged on. 2. Hypokalemia. Will supplement. We'll order a magnesium level to rule out deficiency. 3. Hypertension. Will restart hydrochlorothiazide for which she has been noncompliant with. 4. Type 2 diabetes mellitus. Now diet controlled. Recent hemoglobin A1c was essentially normal. Her venous glucose is modestly elevated currently. 5. Obstructive sleep apnea. We'll continue CPAP.     Plan: 1. IV vancomycin was given in the emergency department. This will be continued. 2. Supportive treatment. 3. Supplement potassium chloride orally. 4. Start sensitive sliding scale NovoLog and monitor CBGs before each meal and at bedtime. 5. Will ask the case manager to assist with finding the patient is a primary care physician who takes Medicaid.   Code Status: Full code Family Communication: No family available Disposition Plan: Likely discharge tomorrow.  Time spent: One hour.  [facility] Pager [phone]  If 7PM-7AM, please contact night-coverage [email or web address] Password [facility] 02/17/2013, 6:51 PM

## 2013-02-17 NOTE — ED Notes (Signed)
Pt just d/c as in pt for would healing to inner right leg. Pt c/o sob and dizziness intermittent x 2 days now. No resp distress noted. Nad. Dizzy now and worse with movement.

## 2013-02-17 NOTE — ED Notes (Signed)
MD at bedside.-D. Sherrie Mustache, MD at bedside

## 2013-02-17 NOTE — ED Provider Notes (Signed)
History    CSN: [id number] Arrival date & time 02/17/13  1355  First MD Initiated Contact with Patient 02/17/13 1522     Chief Complaint  Patient presents with  . Shortness of Breath  . Dizziness  . Wound Check   HPI Pt was seen at 1535.  Per pt and family, c/o gradual onset and worsening of persistent "red rash" on her inner right leg area for the past 1 week. Pt states she was discharged from the hospital 1 week ago for same, but did not take the antibiotics after discharge nor f/u with a doctor.  States the redness was improving when she was on IV abx and was discharged last week; but has significantly worsened since then. Pt states it "broke open and drained" 2 days ago. Pt also c/o feeling "weak and dizzy" as well as SOB for the past several days. States these symptoms began after she stopped taking her BP med (HCTZ). States she "doesn't like to take it" because it "makes me pee a lot."  Denies fevers, no CP/palpitations, no cough, no abd pain, no N/V/D, no focal motor weakness, no tingling/numbness in extremities.    Past Medical History  Diagnosis Date  . Hypertension   . Depression   . Type 2 diabetes mellitus   . Diverticulitis   . Asthma   . Cyst     pt has "cyst" to legs for years, areas drain at time  . Pancreatitis   . Arthritis   . C. difficile colitis 11/08/2011  . Musculoskeletal pain 11/08/2011  . Neuropathic pain 11/10/2011  . Sleep apnea with use of continuous positive airway pressure (CPAP)     uses CPAP at night   Past Surgical History  Procedure Laterality Date  . Tonsillectomy    . Cholecystectomy    . Cesarean section    . Nose surgery      History  Substance Use Topics  . Smoking status: Never Smoker   . Smokeless tobacco: Never Used  . Alcohol Use: No    Review of Systems ROS: Statement: All systems negative except as marked or noted in the HPI; Constitutional: Negative for fever and chills. +"weak and dizzy."; ; Eyes: Negative for eye pain,  redness and discharge. ; ; ENMT: Negative for ear pain, hoarseness, nasal congestion, sinus pressure and sore throat. ; ; Cardiovascular: Negative for chest pain, palpitations, diaphoresis, and peripheral edema. ; ; Respiratory: +SOB. Negative for cough, wheezing and stridor. ; ; Gastrointestinal: Negative for nausea, vomiting, diarrhea, abdominal pain, blood in stool, hematemesis, jaundice and rectal bleeding. . ; ; Genitourinary: Negative for dysuria, flank pain and hematuria. ; ; Musculoskeletal: Negative for back pain and neck pain. Negative for swelling and trauma.; ; Skin: +rash on leg. Negative for pruritus, abrasions, blisters, bruising and skin lesion.; ; Neuro: Negative for headache, lightheadedness and neck stiffness. Negative for altered level of consciousness , altered mental status, extremity weakness, paresthesias, involuntary movement, seizure and syncope.       Allergies  Nitroglycerin; Dilaudid; and Sulfa antibiotics  Home Medications   Current Outpatient Rx  Name  Route  Sig  Dispense  Refill  . albuterol (PROVENTIL HFA;VENTOLIN HFA) 108 (90 BASE) MCG/ACT inhaler   Inhalation   Inhale 2 puffs into the lungs every 4 (four) hours as needed for wheezing or shortness of breath (or coughing).   1 Inhaler   0   . buPROPion (WELLBUTRIN XL) 300 MG 24 hr tablet   Oral  Take 300 mg by mouth daily.          . busPIRone (BUSPAR) 15 MG tablet   Oral   Take 15 mg by mouth 3 (three) times daily.          . clotrimazole-betamethasone (LOTRISONE) cream   Topical   Apply 1 application topically 2 (two) times daily.         . cyclobenzaprine (FLEXERIL) 10 MG tablet   Oral   Take 1 tablet (10 mg total) by mouth 2 (two) times daily as needed for muscle spasms.   15 tablet   0   . escitalopram (LEXAPRO) 20 MG tablet   Oral   Take 20 mg by mouth daily.           . hydrochlorothiazide (HYDRODIURIL) 25 MG tablet   Oral   Take 25 mg by mouth daily.          Marland Kitchen  HYDROcodone-acetaminophen (NORCO/VICODIN) 5-325 MG per tablet   Oral   Take 1-2 tablets by mouth every 4 (four) hours as needed.   30 tablet   0   . ibuprofen (ADVIL,MOTRIN) 200 MG tablet   Oral   Take 800 mg by mouth every 6 (six) hours as needed for pain.         Marland Kitchen loperamide (IMODIUM) 2 MG capsule   Oral   Take 2 mg by mouth 4 (four) times daily as needed for diarrhea or loose stools.         Marland Kitchen LORazepam (ATIVAN) 1 MG tablet   Oral   Take 1 mg by mouth 4 (four) times daily as needed for anxiety.           BP 143/66  Pulse 77  Temp(Src) 98.2 F (36.8 C) (Oral)  Resp 16  SpO2 99%  LMP 01/05/2013 Physical Exam 1540: Physical examination:  Nursing notes reviewed; Vital signs and O2 SAT reviewed;  Constitutional: Well developed, Well nourished, Well hydrated, In no acute distress; Head:  Normocephalic, atraumatic; Eyes: EOMI, PERRL, No scleral icterus; ENMT: Mouth and pharynx normal, Mucous membranes moist; Neck: Supple, Full range of motion, No lymphadenopathy; Cardiovascular: Regular rate and rhythm, No gallop; Respiratory: Breath sounds clear & equal bilaterally, No wheezes.  Speaking full sentences with ease, Normal respiratory effort/excursion; Chest: Nontender, Movement normal; Abdomen: Soft, Nontender, Morbidly obese. Nondistended, Normal bowel sounds; Genitourinary: No CVA tenderness; Extremities: Pulses normal, +large mass to right posterior thigh with induration, erythema, warmth to entire surface, and undersurface with approx 10cm diameter area of superificial wound without active drainage. No fluctuance. No ecchymosis. No edema, No calf asymmetry.; Neuro: AA&Ox3, Major CN grossly intact.  Speech clear. No gross focal motor or sensory deficits in extremities.; Skin: Color normal, Warm, Dry.   ED Course  Procedures     MDM  MDM Reviewed: previous chart, nursing note and vitals Reviewed previous: labs and ECG Interpretation: labs, x-ray and ECG    Date:  02/17/2013  Rate: 78  Rhythm: normal sinus rhythm  QRS Axis: normal  Intervals: PR prolonged  ST/T Wave abnormalities: normal  Conduction Disutrbances:first-degree A-V block   Narrative Interpretation:   Old EKG Reviewed: unchanged; no significant changes from previous EKG dated 10/28/2012.    Results for orders placed during the hospital encounter of 02/17/13  CBC WITH DIFFERENTIAL      Result Value Range   WBC 7.8  4.0 - 10.5 K/uL   RBC 4.09  3.87 - 5.11 MIL/uL   Hemoglobin 11.8 (*) 12.0 -  15.0 g/dL   HCT 08.6 (*) 57.8 - 46.9 %   MCV 87.0  78.0 - 100.0 fL   MCH 28.9  26.0 - 34.0 pg   MCHC 33.1  30.0 - 36.0 g/dL   RDW 62.9  52.8 - 41.3 %   Platelets 243  150 - 400 K/uL   Neutrophils Relative % 69  43 - 77 %   Neutro Abs 5.3  1.7 - 7.7 K/uL   Lymphocytes Relative 23  12 - 46 %   Lymphs Abs 1.8  0.7 - 4.0 K/uL   Monocytes Relative 6  3 - 12 %   Monocytes Absolute 0.5  0.1 - 1.0 K/uL   Eosinophils Relative 2  0 - 5 %   Eosinophils Absolute 0.2  0.0 - 0.7 K/uL   Basophils Relative 0  0 - 1 %   Basophils Absolute 0.0  0.0 - 0.1 K/uL  BASIC METABOLIC PANEL      Result Value Range   Sodium 138  135 - 145 mEq/L   Potassium 3.4 (*) 3.5 - 5.1 mEq/L   Chloride 101  96 - 112 mEq/L   CO2 31  19 - 32 mEq/L   Glucose, Bld 152 (*) 70 - 99 mg/dL   BUN 16  6 - 23 mg/dL   Creatinine, Ser 2.44  0.50 - 1.10 mg/dL   Calcium 01.0  8.4 - 27.2 mg/dL   GFR calc non Af Amer >90  >90 mL/min   GFR calc Af Amer >90  >90 mL/min  GLUCOSE, CAPILLARY      Result Value Range   Glucose-Capillary 143 (*) 70 - 99 mg/dL   Dg Chest Portable 1 View 02/17/2013   *RADIOLOGY REPORT*  Clinical Data: Short of breath and dizzy  PORTABLE CHEST - 1 VIEW  Comparison: Chest radiograph 10/28/2012  Findings: Normal cardiac silhouette.  Mild central venous pulmonary congestion.  No infiltrate or effusion.  No pneumothorax  IMPRESSION: Mild central venous pulmonary congestion.   Original Report Authenticated By: Genevive Bi, M.D.    1625:  Very long d/w pt regarding medication and MD f/u compliance for good continuity of care and control of her symptoms. Pt states she is "willing to take abx I have already taken before because they don't bother me as much" such as doxycycline and clindamycin.  Will start IV vancomycin now. Dx and testing d/w pt and family.  Questions answered.  Verb understanding, agreeable to observation admit.  T/C to [facility] Dr. Sherrie Mustache, case discussed, including:  HPI, pertinent PM/SHx, VS/PE, dx testing, ED course and treatment:  Agreeable to observation admit, requests to write temporary orders, obtain medical bed to team 1.   Laray Anger, DO 02/18/13 1721

## 2013-02-17 NOTE — Progress Notes (Signed)
ANTIBIOTIC CONSULT NOTE - INITIAL  Pharmacy Consult for Vancomycin Indication: cellulitis  Allergies  Allergen Reactions  . Nitroglycerin Anxiety  . Dilaudid (Hydromorphone Hcl) Other (See Comments)    Hallucination  . Levaquin (Levofloxacin In D5w) Nausea And Vomiting  . Sulfa Antibiotics Rash   Patient Measurements: Height: 5\' 6"  (167.6 cm) Weight: 449 lb (203.665 kg) IBW/kg (Calculated) : 59.3  Vital Signs: Temp: 98.7 F (37.1 C) (07/20 1853) Temp src: Oral (07/20 1853) BP: 182/79 mmHg (07/20 1853) Pulse Rate: 82 (07/20 1853) Intake/Output from previous day:   Intake/Output from this shift:    Labs:  Recent Labs  02/17/13 1428  WBC 7.8  HGB 11.8*  PLT 243  CREATININE 0.75   Estimated Creatinine Clearance: 157.3 ml/min (by C-G formula based on Cr of 0.75). No results found for this basename: VANCOTROUGH, VANCOPEAK, VANCORANDOM, GENTTROUGH, GENTPEAK, GENTRANDOM, TOBRATROUGH, TOBRAPEAK, TOBRARND, AMIKACINPEAK, AMIKACINTROU, AMIKACIN,  in the last 72 hours   Microbiology: No results found for this or any previous visit (from the past 720 hour(s)).  Medical History: Past Medical History  Diagnosis Date  . Hypertension   . Depression   . Type 2 diabetes mellitus   . Diverticulitis   . Asthma   . Cyst     pt has "cyst" to legs for years, areas drain at time  . Pancreatitis   . Arthritis   . C. difficile colitis 11/08/2011  . Musculoskeletal pain 11/08/2011  . Neuropathic pain 11/10/2011  . Sleep apnea with use of continuous positive airway pressure (CPAP)     uses CPAP at night  . Benign lipomatous neoplasm of skin, subcu of right leg 02/17/2013   Medications:  reviewed Assessment: Morbidly obese female c/o LE cellulitis.  Good renal fxn.  Estimated Creatinine Clearance: 157.3 ml/min (by C-G formula based on Cr of 0.75).   Goal of Therapy:  Trough level 10-15  Plan:  Vancomycin 1gm iv q8hrs Check trough at steady state Monitor labs, renal fxn, and  cultures  Stacey [name] A 02/17/2013,7:14 PM

## 2013-02-18 DIAGNOSIS — D649 Anemia, unspecified: Secondary | ICD-10-CM

## 2013-02-18 DIAGNOSIS — E876 Hypokalemia: Secondary | ICD-10-CM

## 2013-02-18 LAB — GLUCOSE, CAPILLARY
Glucose-Capillary: 107 mg/dL — ABNORMAL HIGH (ref 70–99)
Glucose-Capillary: 114 mg/dL — ABNORMAL HIGH (ref 70–99)

## 2013-02-18 LAB — CBC
HCT: 34.7 % — ABNORMAL LOW (ref 36.0–46.0)
Hemoglobin: 11.4 g/dL — ABNORMAL LOW (ref 12.0–15.0)
MCV: 86.5 fL (ref 78.0–100.0)
RBC: 4.01 MIL/uL (ref 3.87–5.11)
WBC: 8.6 10*3/uL (ref 4.0–10.5)

## 2013-02-18 LAB — BASIC METABOLIC PANEL
BUN: 14 mg/dL (ref 6–23)
Calcium: 9.6 mg/dL (ref 8.4–10.5)
GFR calc Af Amer: >90 mL/min (ref >90)
Glucose, Bld: 106 mg/dL — ABNORMAL HIGH (ref 70–99)

## 2013-02-18 MED ORDER — POTASSIUM CHLORIDE CRYS ER 20 MEQ PO TBCR
40.0000 meq | EXTENDED_RELEASE_TABLET | Freq: Once | ORAL | Status: AC
  Administered 2013-02-18: 40 meq via ORAL
  Filled 2013-02-18: qty 2

## 2013-02-18 MED ORDER — ALUM & MAG HYDROXIDE-SIMETH 200-200-20 MG/5ML PO SUSP: 30.0000 mL | Freq: Four times a day (QID) | ORAL | Status: DC | PRN

## 2013-02-18 MED ORDER — DSS 100 MG PO CAPS: 100.0000 mg | ORAL_CAPSULE | Freq: Two times a day (BID) | ORAL | Status: DC

## 2013-02-18 MED ORDER — POTASSIUM CHLORIDE ER 10 MEQ PO TBCR: 10.0000 meq | EXTENDED_RELEASE_TABLET | Freq: Two times a day (BID) | ORAL | Status: DC

## 2013-02-18 MED ORDER — METRONIDAZOLE 500 MG PO TABS: 500.0000 mg | ORAL_TABLET | Freq: Two times a day (BID) | ORAL | Status: AC

## 2013-02-18 MED ORDER — CIPROFLOXACIN HCL 250 MG PO TABS: 250.0000 mg | ORAL_TABLET | Freq: Two times a day (BID) | ORAL | Status: AC

## 2013-02-18 NOTE — Progress Notes (Signed)
Utilization Review Complete  

## 2013-02-18 NOTE — Discharge Summary (Addendum)
Physician Discharge Summary  Stacey [name]:[id number] DOB: [birth year] DOA: 02/17/2013  PCP: No PCP Per Patient  Admit date: 02/17/2013 Discharge date: 02/18/2013  Time spent: minutes  Recommendations for Outpatient Follow-up:  1. Has an appointment 02/21/13 at [facility] clinic. Recommend rechecking her basic metabolic panel to track potassium level and magnesium.   Discharge Diagnoses:  Principal Problem:   Cellulitis of right leg Active Problems:   Morbid obesity   HTN (hypertension)   Hypokalemia   Obstructive sleep apnea   Diabetes mellitus type II, controlled   Benign lipomatous neoplasm of skin, subcu of right leg   Anemia   Discharge Condition: stable  Diet recommendation: carb modified  Filed Weights   02/17/13 1853  Weight: 449 lb (203.665 kg)    History of present illness:  Stacey [name] is a 52 y.o. female with a history significant for diet-controlled diabetes mellitus, morbid obesity, and large right leg lipoma. She was recently discharged from the hospital on 02/09/2013 for treatment of right lower extremity (right leg lipoma) cellulitis. She was discharged on Levaquin, however, she took one dose and it caused nausea and vomiting. She was also referred to a local primary care physician following discharge, but was told that the physician did not take Medicaid. The plan was for her to be referred to plastic surgery at [facility] for a possible excision of the right leg mass. Over the prior few days before her presentation on 02/17/13, her sister noticed that the right leg lipoma had become more red and excoriated. It also became warm.  Reportedly part of the skin "broke open and drained" for couple days. There was no obvious pus but it was a fluidlike material. The leg/lipoma was a little uncomfortable, but she denied outright pain. She denied subjective fever and chills, but she  had generalized malaise. She had occasional shortness of breath with  ambulation. She denied chest pain, palpitations, cough, abdominal pain, nausea, vomiting, diarrhea, or numbness and tingling in her feet.  In the emergency department, she was afebrile and hemodynamically stable. Her blood pressure ranged from 139-182 systolically. Her chest x-ray revealed pulmonary vascular congestion. Her lab data were significant for a normal WBC, hemoglobin 11.8, glucose of 152 and potassium of 3.4. She was admitted for further evaluation and management.   Hospital Course:  1. Cellulitis of the right thigh lipoma. The patient failed outpatient treatment as she could not tolerate Levaquin. She was neither febrile nor did she have a high white blood cell count. She was not toxic appearing. She was admitted and received  IV antibiotics . She has done well on Cipro and Flagyl in the past and will be discharged on these medications for 7 more days. 2. Hypokalemia. Mild. Likely related to HCTZ that pt takes although she does not take daily as prescribed.  Mag level within the limits of normal. Pt was given Kdur on day of discharge. She will be discharged with daily supplement. Recommend BMET on 02/21/13 at the appointment with her new PCP to track potassium level.  3. Hypertension. Poor control likely related to non-compliance with medications. At discharge BP with good control as she has received HCTZ prescribed. Educated the patient at discharge to the importance of BP control.  4. Type 2 diabetes mellitus. Now diet controlled. Recent hemoglobin A1c was essentially normal. CBG range during this hospitalization was from 107 to114.  5. Obstructive sleep apnea. Continued  CPA. No issues during this hospitalization   Procedures:  none  Consultations:  none  Discharge Exam: Filed Vitals:   02/17/13 1715 02/17/13 1853 02/17/13 2239 02/18/13 0458  BP:  182/79 153/77 137/56  Pulse: 67 82 68 64  Temp:  98.7 F (37.1 C) 97.6 F (36.4 C) 97.7 F (36.5 C)  TempSrc:  Oral Oral  Oral  Resp: 11  20 20   Height:  5\' 6"  (1.676 m)    Weight:  449 lb (203.665 kg)    SpO2: 98% 96% 96% 96%    General: Morbidly obese lady sitting on side of bed. NAD. Cardiovascular: RRR, S1 and S2 without murmur, gallup or rub. Trace LE edema Respiratory: normal effort. Breath sounds clear to ausculation bilaterally. No wheeze and no rhonchi noted Skin: large right inner thigh mass consistent with lipoma with mild erythema and induration. Mild warmth to touch. Area non-tender to touch. Quarter size area of excoriation on posterior aspect of mass. Moderate amount clear drainage. No odor on exam.   Discharge Instructions     Medication List         albuterol 108 (90 BASE) MCG/ACT inhaler  Commonly known as:  PROVENTIL HFA;VENTOLIN HFA  Inhale 2 puffs into the lungs every 4 (four) hours as needed for wheezing or shortness of breath (or coughing).     alum & mag hydroxide-simeth 200-200-20 MG/5ML suspension  Commonly known as:  MAALOX/MYLANTA  Take 30 mLs by mouth every 6 (six) hours as needed.     buPROPion 300 MG 24 hr tablet  Commonly known as:  WELLBUTRIN XL  Take 300 mg by mouth daily.     busPIRone 15 MG tablet  Commonly known as:  BUSPAR  Take 15 mg by mouth 3 (three) times daily.     ciprofloxacin 250 MG tablet  Commonly known as:  CIPRO  Take 1 tablet (250 mg total) by mouth 2 (two) times daily.     clotrimazole-betamethasone cream  Commonly known as:  LOTRISONE  Apply 1 application topically 2 (two) times daily.     cyclobenzaprine 10 MG tablet  Commonly known as:  FLEXERIL  Take 1 tablet (10 mg total) by mouth 2 (two) times daily as needed for muscle spasms.     escitalopram 20 MG tablet  Commonly known as:  LEXAPRO  Take 20 mg by mouth daily.     hydrochlorothiazide 25 MG tablet  Commonly known as:  HYDRODIURIL  Take 25 mg by mouth daily.     HYDROcodone-acetaminophen 5-325 MG per tablet  Commonly known as:  NORCO/VICODIN  Take 1-2 tablets by mouth  every 4 (four) hours as needed.     ibuprofen 200 MG tablet  Commonly known as:  ADVIL,MOTRIN  Take 800 mg by mouth every 6 (six) hours as needed for pain.     loperamide 2 MG capsule  Commonly known as:  IMODIUM  Take 2 mg by mouth 4 (four) times daily as needed for diarrhea or loose stools.     LORazepam 1 MG tablet  Commonly known as:  ATIVAN  Take 1 mg by mouth 4 (four) times daily as needed for anxiety.     metroNIDAZOLE 500 MG tablet  Commonly known as:  FLAGYL  Take 1 tablet (500 mg total) by mouth 2 (two) times daily.     potassium chloride 10 MEQ tablet  Commonly known as:  K-DUR  Take 1 tablet (10 mEq total) by mouth 2 (two) times daily.       Allergies  Allergen Reactions  . Nitroglycerin  Anxiety  . Dilaudid (Hydromorphone Hcl) Other (See Comments)    Hallucination  . Levaquin (Levofloxacin In D5w) Nausea And Vomiting  . Sulfa Antibiotics Rash       Follow-up Information   Follow up with [facility]  / [facility] On 02/21/2013. (appt @ 3:30, arrive 10 minutes early with ID and Medicaid Card)    Contact information:   [facility] [address] [phone]       The results of significant diagnostics from this hospitalization (including imaging, microbiology, ancillary and laboratory) are listed below for reference.    Significant Diagnostic Studies: [place] Extrem Low Right Ltd  02/07/2013   *RADIOLOGY REPORT*  Clinical Data: Erythematous region right thigh  ULTRASOUND RIGHT LOWER EXTREMITY LIMITED  Technique:  Ultrasound examination of the region of interest in the right lower extremity was performed.  Comparison:  None  Findings: Sonography of the area concern at the right thigh demonstrates significant subcutaneous edema. Associated skin thickening is present. No discrete mass or focal fluid collection identified.  IMPRESSION: Significant subcutaneous edema at the area of clinical concern in the right thigh without discrete mass or focal  fluid collection.   Original Report Authenticated By: Ulyses Southward, M.D.   Dg Chest Portable 1 View  02/17/2013   *RADIOLOGY REPORT*  Clinical Data: Short of breath and dizzy  PORTABLE CHEST - 1 VIEW  Comparison: Chest radiograph 10/28/2012  Findings: Normal cardiac silhouette.  Mild central venous pulmonary congestion.  No infiltrate or effusion.  No pneumothorax  IMPRESSION: Mild central venous pulmonary congestion.   Original Report Authenticated By: Genevive Bi, M.D.    Microbiology: No results found for this or any previous visit (from the past 240 hour(s)).   Labs: Basic Metabolic Panel:  Recent Labs Lab 02/17/13 1428 02/18/13 0500  NA 138 138  K 3.4* 3.4*  CL 101 101  CO2 31 29  GLUCOSE 152* 106*  BUN 16 14  CREATININE 0.75 0.78  CALCIUM 10.0 9.6  MG  --  1.9   Liver Function Tests: No results found for this basename: AST, ALT, ALKPHOS, BILITOT, PROT, ALBUMIN,  in the last 168 hours No results found for this basename: LIPASE, AMYLASE,  in the last 168 hours No results found for this basename: AMMONIA,  in the last 168 hours CBC:  Recent Labs Lab 02/17/13 1428 02/18/13 0500  WBC 7.8 8.6  NEUTROABS 5.3  --   HGB 11.8* 11.4*  HCT 35.6* 34.7*  MCV 87.0 86.5  PLT 243 253   Cardiac Enzymes: No results found for this basename: CKTOTAL, CKMB, CKMBINDEX, TROPONINI,  in the last 168 hours BNP: BNP (last 3 results) No results found for this basename: PROBNP,  in the last 8760 hours CBG:  Recent Labs Lab 02/17/13 1415 02/17/13 2122 02/18/13 0748 02/18/13 1117  GLUCAP 143* 111* 107* 114*       Signed:  BLACK,KAREN M  [facility] 02/18/2013, 1:26 PM   Attending:  The patient was seen and examined. She was discussed with nurse particular, Ms. Vedia Coffer. Agree with the assessment and plan as stated above.  Elliot Cousin, M.D.

## 2013-02-18 NOTE — Progress Notes (Signed)
Discharge prescriptions and instructions given to the patient. Verbalizes understanding. Condition stable. Discharged via wheelchair accompanied by staff and family.

## 2013-02-18 NOTE — Care Management Note (Signed)
    Page 1 of 1   02/18/2013     11:05:56 AM   CARE MANAGEMENT NOTE 02/18/2013  Patient:  Stacey [name], Stacey [name]   Account Number:  [id number]  Date Initiated:  02/18/2013  Documentation initiated by:  Rosemary Holms  Subjective/Objective Assessment:   Pt admitted with cellulitis. Was scheduled to see Dr. Renard Matter but MD does not see Medicaid pts per pt. Scheduled with [facility]inis on thursday 7/24.     Action/Plan:   Anticipated DC Date:  02/18/2013   Anticipated DC Plan:  HOME/SELF CARE      DC Planning Services  CM consult      Choice offered to / List presented to:             Status of service:  Completed, signed off Medicare Important Message given?   (If response is "NO", the following Medicare IM given date fields will be blank) Date Medicare IM given:   Date Additional Medicare IM given:    Discharge Disposition:    Per UR Regulation:    If discussed at Long Length of Stay Meetings, dates discussed:    Comments:  02/18/13 1100 Tanesha Arambula Leanord Hawking RN BSN CM

## 2013-03-04 ENCOUNTER — Emergency Department (HOSPITAL_COMMUNITY)
Admission: EM | Admit: 2013-03-04 | Discharge: 2013-03-05 | Disposition: A | Discharge status: Home / Self Care | Payer: Medicaid Other | Attending: Emergency Medicine | Admitting: Emergency Medicine

## 2013-03-04 ENCOUNTER — Encounter (HOSPITAL_COMMUNITY): Payer: Self-pay

## 2013-03-04 DIAGNOSIS — F3289 Other specified depressive episodes: Secondary | ICD-10-CM | POA: Insufficient documentation

## 2013-03-04 DIAGNOSIS — G4733 Obstructive sleep apnea (adult) (pediatric): Secondary | ICD-10-CM | POA: Insufficient documentation

## 2013-03-04 DIAGNOSIS — Z872 Personal history of diseases of the skin and subcutaneous tissue: Secondary | ICD-10-CM | POA: Insufficient documentation

## 2013-03-04 DIAGNOSIS — I10 Essential (primary) hypertension: Secondary | ICD-10-CM | POA: Insufficient documentation

## 2013-03-04 DIAGNOSIS — F329 Major depressive disorder, single episode, unspecified: Secondary | ICD-10-CM | POA: Insufficient documentation

## 2013-03-04 DIAGNOSIS — Z8719 Personal history of other diseases of the digestive system: Secondary | ICD-10-CM | POA: Insufficient documentation

## 2013-03-04 DIAGNOSIS — Z8669 Personal history of other diseases of the nervous system and sense organs: Secondary | ICD-10-CM | POA: Insufficient documentation

## 2013-03-04 DIAGNOSIS — D1720 Benign lipomatous neoplasm of skin and subcutaneous tissue of unspecified limb: Secondary | ICD-10-CM

## 2013-03-04 DIAGNOSIS — D1779 Benign lipomatous neoplasm of other sites: Secondary | ICD-10-CM | POA: Insufficient documentation

## 2013-03-04 DIAGNOSIS — E119 Type 2 diabetes mellitus without complications: Secondary | ICD-10-CM | POA: Insufficient documentation

## 2013-03-04 DIAGNOSIS — Z8739 Personal history of other diseases of the musculoskeletal system and connective tissue: Secondary | ICD-10-CM | POA: Insufficient documentation

## 2013-03-04 DIAGNOSIS — Z79899 Other long term (current) drug therapy: Secondary | ICD-10-CM | POA: Insufficient documentation

## 2013-03-04 DIAGNOSIS — J45909 Unspecified asthma, uncomplicated: Secondary | ICD-10-CM | POA: Insufficient documentation

## 2013-03-04 DIAGNOSIS — Z8619 Personal history of other infectious and parasitic diseases: Secondary | ICD-10-CM | POA: Insufficient documentation

## 2013-03-04 LAB — CBC WITH DIFFERENTIAL/PLATELET
Eosinophils Relative: 3 % (ref 0–5)
HCT: 34.6 % — ABNORMAL LOW (ref 36.0–46.0)
Lymphocytes Relative: 27 % (ref 12–46)
Lymphs Abs: 2.3 10*3/uL (ref 0.7–4.0)
MCV: 86.7 fL (ref 78.0–100.0)
Monocytes Absolute: 0.5 10*3/uL (ref 0.1–1.0)
Monocytes Relative: 6 % (ref 3–12)
RBC: 3.99 MIL/uL (ref 3.87–5.11)
WBC: 8.6 10*3/uL (ref 4.0–10.5)

## 2013-03-04 LAB — BASIC METABOLIC PANEL
CO2: 26 mEq/L (ref 19–32)
Chloride: 102 mEq/L (ref 96–112)
Creatinine, Ser: 0.78 mg/dL (ref 0.50–1.10)
Sodium: 138 mEq/L (ref 135–145)

## 2013-03-04 MED ORDER — VANCOMYCIN HCL IN DEXTROSE 1-5 GM/200ML-% IV SOLN
1000.0000 mg | Freq: Once | INTRAVENOUS | Status: AC
  Administered 2013-03-04: 1000 mg via INTRAVENOUS
  Filled 2013-03-04: qty 200

## 2013-03-04 MED ORDER — FENTANYL CITRATE 0.05 MG/ML IJ SOLN
200.0000 ug | Freq: Once | INTRAMUSCULAR | Status: AC
  Administered 2013-03-04 – 2013-03-05 (×2): 100 ug via INTRAVENOUS
  Filled 2013-03-04: qty 4

## 2013-03-04 MED ORDER — SODIUM CHLORIDE 0.9 % IV SOLN
Freq: Once | INTRAVENOUS | Status: AC
  Administered 2013-03-04: via INTRAVENOUS

## 2013-03-04 NOTE — ED Notes (Signed)
Pt put on contact precautions.

## 2013-03-04 NOTE — ED Provider Notes (Signed)
CSN: [id number]     Arrival date & time 03/04/13  2114 History  This chart was scribed for Hanley Seamen, MD by Bennett Scrape, ED Scribe. This patient was seen in room APA10/APA10 and the patient's care was started at 10:56 PM.      Chief Complaint  Patient presents with  . Leg Pain    The history is provided by the patient. No language interpreter was used.    HPI Comments: Stacey [name] is a 52 y.o. female with a history of a right medial thigh lipoma and recent admission for cellulitis of that lipoma. She presents to the Emergency Department complaining of 4 days of worsening of chronic right upper leg pain described as the weight of the lipoma pulling on the lateral thigh. The pain is worse with walking and pt reports decreased ambulation secondary to pain. She was on vancomycin during her admission for the same and was discharged with Cipro and Flagyl. She reports that she stopped the antibiotic use due to the nausea side effect. She states that she has noticed drainage from the area that has been chronic but states that she is unaware if the area is more red due to her body habitus making it impossible for her to visualize the area. She denies fever, chills, nausea, vomiting, diarrhea, chest pain or shortness of breath.  Past Medical History  Diagnosis Date  . Hypertension   . Depression   . Type 2 diabetes mellitus   . Diverticulitis   . Asthma   . Cyst     pt has "cyst" to legs for years, areas drain at time  . Pancreatitis   . Arthritis   . C. difficile colitis 11/08/2011  . Musculoskeletal pain 11/08/2011  . Neuropathic pain 11/10/2011  . Sleep apnea with use of continuous positive airway pressure (CPAP)     uses CPAP at night  . Benign lipomatous neoplasm of skin, subcu of right leg 02/17/2013   Past Surgical History  Procedure Laterality Date  . Tonsillectomy    . Cholecystectomy    . Cesarean section    . Nose surgery     No family history on file. History   Substance Use Topics  . Smoking status: Never Smoker   . Smokeless tobacco: Never Used  . Alcohol Use: No   No OB history provided.  Review of Systems  A complete 10 system review of systems was obtained and all systems are negative except as noted in the HPI and PMH.   Allergies  Nitroglycerin; Dilaudid; Levaquin; and Sulfa antibiotics  Home Medications   Current Outpatient Rx  Name  Route  Sig  Dispense  Refill  . B Complex-Biotin-FA (B-COMPLEX PO)   Oral   Take 2 tablets by mouth once a week.         Marland Kitchen buPROPion (WELLBUTRIN XL) 300 MG 24 hr tablet   Oral   Take 300 mg by mouth daily.          . busPIRone (BUSPAR) 15 MG tablet   Oral   Take 15 mg by mouth 3 (three) times daily.          . clotrimazole-betamethasone (LOTRISONE) cream   Topical   Apply 1 application topically 2 (two) times daily.         . cyclobenzaprine (FLEXERIL) 10 MG tablet   Oral   Take 1 tablet (10 mg total) by mouth 2 (two) times daily as needed for muscle spasms.  15 tablet   0   . escitalopram (LEXAPRO) 20 MG tablet   Oral   Take 20 mg by mouth daily.           . hydrochlorothiazide (HYDRODIURIL) 25 MG tablet   Oral   Take 25 mg by mouth daily.          Marland Kitchen HYDROcodone-acetaminophen (NORCO/VICODIN) 5-325 MG per tablet   Oral   Take 1-2 tablets by mouth every 4 (four) hours as needed.   30 tablet   0   . ibuprofen (ADVIL,MOTRIN) 200 MG tablet   Oral   Take 800 mg by mouth every 6 (six) hours as needed for pain.         Marland Kitchen loperamide (IMODIUM) 2 MG capsule   Oral   Take 2 mg by mouth daily.          Marland Kitchen LORazepam (ATIVAN) 1 MG tablet   Oral   Take 1 mg by mouth 4 (four) times daily as needed for anxiety.          Marland Kitchen albuterol (PROVENTIL HFA;VENTOLIN HFA) 108 (90 BASE) MCG/ACT inhaler   Inhalation   Inhale 2 puffs into the lungs every 4 (four) hours as needed for wheezing or shortness of breath (or coughing).   1 Inhaler   0   . potassium chloride (K-DUR)  10 MEQ tablet   Oral   Take 1 tablet (10 mEq total) by mouth 2 (two) times daily.   60 tablet   0     Physical Exam  Nursing note and vitals reviewed.   Triage Vitals: BP 179/65  Pulse 80  Temp(Src) 98.4 F (36.9 C) (Oral)  Resp 20  Ht 5\' 6"  (1.676 m)  Wt 449 lb (203.665 kg)  BMI 72.51 kg/m2  SpO2 100%  LMP 01/05/2013  General Appearance:    Alert, cooperative, no distress, appears stated age, morbidly obese  Head:    Normocephalic, without obvious abnormality, atraumatic  Eyes:    PERRL, conjunctiva/corneas clear, EOM's intact, both eyes  Ears:    Normal TM's and external ear canals, both ears  Nose:   Nares normal, septum midline, mucosa normal, no drainage  Throat:   Lips, mucosa, and tongue normal  Neck:   Supple, symmetrical, trachea midline, no adenopathy;     Back:     Symmetric, ROM normal  Lungs:     Clear to auscultation bilaterally, respirations unlabored  Chest Wall:    No tenderness or deformity   Heart:    Regular rate and rhythm, S1 and S2 normal, no murmur, rub   or gallop     Abdomen:     Soft, non-tender, unable to fully assess due to body habitus          Extremities:    erythematous lipomatous mass of the right thigh with associated edema and erythema but no warmth; trace edema of the lower legs  Pulses:   2+ and symmetric all extremities  Skin:   Skin color, texture, turgor normal, no rashes or lesions except As noted under Extremities          ED Course   Procedures (including critical care time)  DIAGNOSTIC STUDIES: Oxygen Saturation is 100% on room air, normal by my interpretation.    COORDINATION OF CARE: 11:02 PM-Pt has made it clear to nursing staff and myself that she refuses admission for this. Plan is to give IV antibiotics and pain medications in the ED which the pt is agreeable  to. 11:10 PM-Per nursing staff, pt has also been non-compliant with antihypertensives due to increased urine output   MDM   Nursing notes and  vitals signs, including pulse oximetry, reviewed.  Summary of this visit's results, reviewed by myself:  Labs:  Results for orders placed during the hospital encounter of 03/04/13 (from the past 24 hour(s))  BASIC METABOLIC PANEL     Status: Abnormal   Collection Time    03/04/13 11:06 PM      Result Value Range   Sodium 138  135 - 145 mEq/L   Potassium 3.5  3.5 - 5.1 mEq/L   Chloride 102  96 - 112 mEq/L   CO2 26  19 - 32 mEq/L   Glucose, Bld 184 (*) 70 - 99 mg/dL   BUN 13  6 - 23 mg/dL   Creatinine, Ser 1.19  0.50 - 1.10 mg/dL   Calcium 9.5  8.4 - 14.7 mg/dL   GFR calc non Af Amer >90  >90 mL/min   GFR calc Af Amer >90  >90 mL/min  CBC WITH DIFFERENTIAL     Status: Abnormal   Collection Time    03/04/13 11:06 PM      Result Value Range   WBC 8.6  4.0 - 10.5 K/uL   RBC 3.99  3.87 - 5.11 MIL/uL   Hemoglobin 11.5 (*) 12.0 - 15.0 g/dL   HCT 82.9 (*) 56.2 - 13.0 %   MCV 86.7  78.0 - 100.0 fL   MCH 28.8  26.0 - 34.0 pg   MCHC 33.2  30.0 - 36.0 g/dL   RDW 86.5  78.4 - 69.6 %   Platelets 269  150 - 400 K/uL   Neutrophils Relative % 64  43 - 77 %   Neutro Abs 5.5  1.7 - 7.7 K/uL   Lymphocytes Relative 27  12 - 46 %   Lymphs Abs 2.3  0.7 - 4.0 K/uL   Monocytes Relative 6  3 - 12 %   Monocytes Absolute 0.5  0.1 - 1.0 K/uL   Eosinophils Relative 3  0 - 5 %   Eosinophils Absolute 0.2  0.0 - 0.7 K/uL   Basophils Relative 0  0 - 1 %   Basophils Absolute 0.0  0.0 - 0.1 K/uL   12:52 AM The patient was given 1 g of vancomycin IV the. She was advised that she needs to restart her antibiotics.   I personally performed the services described in this documentation, which was scribed in my presence.  The recorded information has been reviewed and is accurate.   Hanley Seamen, MD 03/05/13 [phone]

## 2013-03-04 NOTE — ED Notes (Signed)
Patient ambulatory to restroom with assistance, clean catch instructions given, hat placed in commode and advised pt to bring specimen back to room as well.

## 2013-03-04 NOTE — ED Notes (Signed)
Right knee " tumor", admitted here 7/20 for cellulitis of same. No c/o persistant pain to right knee

## 2013-03-05 MED ORDER — FLUCONAZOLE 100 MG PO TABS
150.0000 mg | ORAL_TABLET | Freq: Once | ORAL | Status: AC
  Administered 2013-03-05: 150 mg via ORAL
  Filled 2013-03-05: qty 2

## 2013-03-05 MED ORDER — OXYCODONE-ACETAMINOPHEN 5-325 MG PO TABS: 1.0000 | ORAL_TABLET | Freq: Four times a day (QID) | ORAL | Status: DC | PRN

## 2013-03-05 NOTE — ED Notes (Signed)
Patient given discharge instruction, verbalized understand. IV removed, band aid applied. Patient in wheelchair out of the department with sister and son's

## 2013-03-05 NOTE — ED Notes (Signed)
Pt states he leg is draining on the bottom, she is unable to reach and clean herself. Pt requesting to be cleaned. Tech's at the bedside to assist with cleaning pt.

## 2013-03-05 NOTE — ED Notes (Signed)
Pt requesting 2nd half to pain medication. States pain was going up after the movement and cleaning of her leg. Pain 6/10. MD advised and stated to given 2nd half of medication.

## 2013-03-05 NOTE — ED Notes (Signed)
Pt complaining of yeast infection, states she forgot to tell MD. States all the antibiotic treatment has given her a yeast infection. MD Aware, orders given.

## 2013-03-17 ENCOUNTER — Encounter (HOSPITAL_COMMUNITY): Payer: Self-pay | Admitting: Emergency Medicine

## 2013-03-17 ENCOUNTER — Emergency Department (HOSPITAL_COMMUNITY)
Admission: EM | Admit: 2013-03-17 | Discharge: 2013-03-17 | Disposition: A | Discharge status: Home / Self Care | Payer: Medicaid Other | Attending: Emergency Medicine | Admitting: Emergency Medicine

## 2013-03-17 DIAGNOSIS — Z8739 Personal history of other diseases of the musculoskeletal system and connective tissue: Secondary | ICD-10-CM | POA: Insufficient documentation

## 2013-03-17 DIAGNOSIS — M129 Arthropathy, unspecified: Secondary | ICD-10-CM | POA: Insufficient documentation

## 2013-03-17 DIAGNOSIS — I10 Essential (primary) hypertension: Secondary | ICD-10-CM | POA: Insufficient documentation

## 2013-03-17 DIAGNOSIS — F329 Major depressive disorder, single episode, unspecified: Secondary | ICD-10-CM | POA: Insufficient documentation

## 2013-03-17 DIAGNOSIS — F3289 Other specified depressive episodes: Secondary | ICD-10-CM | POA: Insufficient documentation

## 2013-03-17 DIAGNOSIS — Z8719 Personal history of other diseases of the digestive system: Secondary | ICD-10-CM | POA: Insufficient documentation

## 2013-03-17 DIAGNOSIS — M7989 Other specified soft tissue disorders: Secondary | ICD-10-CM | POA: Insufficient documentation

## 2013-03-17 DIAGNOSIS — Z8619 Personal history of other infectious and parasitic diseases: Secondary | ICD-10-CM | POA: Insufficient documentation

## 2013-03-17 DIAGNOSIS — Z9089 Acquired absence of other organs: Secondary | ICD-10-CM | POA: Insufficient documentation

## 2013-03-17 DIAGNOSIS — J45909 Unspecified asthma, uncomplicated: Secondary | ICD-10-CM | POA: Insufficient documentation

## 2013-03-17 DIAGNOSIS — G4730 Sleep apnea, unspecified: Secondary | ICD-10-CM | POA: Insufficient documentation

## 2013-03-17 DIAGNOSIS — Z872 Personal history of diseases of the skin and subcutaneous tissue: Secondary | ICD-10-CM | POA: Insufficient documentation

## 2013-03-17 DIAGNOSIS — R450 Nervousness: Secondary | ICD-10-CM | POA: Insufficient documentation

## 2013-03-17 DIAGNOSIS — R6883 Chills (without fever): Secondary | ICD-10-CM | POA: Insufficient documentation

## 2013-03-17 DIAGNOSIS — L02419 Cutaneous abscess of limb, unspecified: Secondary | ICD-10-CM | POA: Insufficient documentation

## 2013-03-17 DIAGNOSIS — L0390 Cellulitis, unspecified: Secondary | ICD-10-CM

## 2013-03-17 DIAGNOSIS — F411 Generalized anxiety disorder: Secondary | ICD-10-CM | POA: Insufficient documentation

## 2013-03-17 DIAGNOSIS — E119 Type 2 diabetes mellitus without complications: Secondary | ICD-10-CM | POA: Insufficient documentation

## 2013-03-17 DIAGNOSIS — Z79899 Other long term (current) drug therapy: Secondary | ICD-10-CM | POA: Insufficient documentation

## 2013-03-17 DIAGNOSIS — Z9981 Dependence on supplemental oxygen: Secondary | ICD-10-CM | POA: Insufficient documentation

## 2013-03-17 DIAGNOSIS — R109 Unspecified abdominal pain: Secondary | ICD-10-CM | POA: Insufficient documentation

## 2013-03-17 LAB — BASIC METABOLIC PANEL WITH GFR
BUN: 17 mg/dL (ref 6–23)
CO2: 25 meq/L (ref 19–32)
Calcium: 9.7 mg/dL (ref 8.4–10.5)
Chloride: 103 meq/L (ref 96–112)
Creatinine, Ser: 0.72 mg/dL (ref 0.50–1.10)
GFR calc Af Amer: >90 mL/min (ref >90)
GFR calc non Af Amer: >90 mL/min (ref >90)
Glucose, Bld: 112 mg/dL — ABNORMAL HIGH (ref 70–99)
Potassium: 3.8 meq/L (ref 3.5–5.1)
Sodium: 137 meq/L (ref 135–145)

## 2013-03-17 LAB — CBC WITH DIFFERENTIAL/PLATELET
Basophils Absolute: 0 10*3/uL (ref 0.0–0.1)
Basophils Relative: 0 % (ref 0–1)
Eosinophils Relative: 1 % (ref 0–5)
HCT: 36.3 % (ref 36.0–46.0)
MCHC: 33.1 g/dL (ref 30.0–36.0)
MCV: 85.6 fL (ref 78.0–100.0)
Monocytes Absolute: 0.4 10*3/uL (ref 0.1–1.0)
RDW: 13.4 % (ref 11.5–15.5)

## 2013-03-17 MED ORDER — CEPHALEXIN 500 MG PO CAPS: 500.0000 mg | ORAL_CAPSULE | Freq: Four times a day (QID) | ORAL | Status: DC

## 2013-03-17 MED ORDER — DOXYCYCLINE HYCLATE 100 MG PO CAPS: 100.0000 mg | ORAL_CAPSULE | Freq: Two times a day (BID) | ORAL | Status: DC

## 2013-03-17 MED ORDER — FENTANYL CITRATE 0.05 MG/ML IJ SOLN
75.0000 ug | Freq: Once | INTRAMUSCULAR | Status: AC
  Administered 2013-03-17: 75 ug via INTRAVENOUS
  Filled 2013-03-17: qty 2

## 2013-03-17 MED ORDER — FENTANYL CITRATE 0.05 MG/ML IJ SOLN: 50.0000 ug | Freq: Once | INTRAMUSCULAR | Status: DC

## 2013-03-17 MED ORDER — HYDROCODONE-ACETAMINOPHEN 5-325 MG PO TABS: 1.0000 | ORAL_TABLET | Freq: Four times a day (QID) | ORAL | Status: DC | PRN

## 2013-03-17 MED ORDER — VANCOMYCIN HCL IN DEXTROSE 1-5 GM/200ML-% IV SOLN
1000.0000 mg | Freq: Once | INTRAVENOUS | Status: AC
  Administered 2013-03-17: 1000 mg via INTRAVENOUS
  Filled 2013-03-17: qty 200

## 2013-03-17 NOTE — ED Provider Notes (Signed)
CSN: [id number]     Arrival date & time 03/17/13  0827 History    This chart was scribed for Derwood Kaplan, MD,  by Ashley Jacobs, ED Scribe. The patient was seen in room APA11/APA11 and the patient's care was started at 9:35 AM.     Chief Complaint  Patient presents with  . Leg Pain   (Consider location/radiation/quality/duration/timing/severity/associated sxs/prior Treatment) Patient is a 52 y.o. female presenting with leg pain. The history is provided by the patient and medical records. No language interpreter was used.  Leg Pain Location:  Leg Injury: no   Leg location:  R upper leg Pain details:    Quality:  Aching   Radiates to:  Does not radiate   Severity:  Moderate   Onset quality:  Gradual   Timing:  Constant   Progression:  Worsening Chronicity:  Chronic Foreign body present:  No foreign bodies Prior injury to area:  No Relieved by:  Nothing Worsened by:  Bearing weight Associated symptoms: swelling   Associated symptoms: no fever   Risk factors: obesity    HPI Comments: Stacey [name] is a 53 y.o. female who presents to the Emergency Department complaining of pain to the upper right leg that worsened the day prior to arriving. She mentions that this episode is similar to a recent episode of cellulitis in the same region. She explains that the leg pain is worsened with bearing weight. She also complains of heavier than normal menstrual cycle for the past two days. Pt reports that her current menstrual cycle is irregular and having her last menses 2-3 months ago. She explains that this morning upon standing she bled heavily and is now having chills. Pt has been seen in the ED for the same complain in the past ,reqd admission as well. She also admits to seeing [facility] system..She denies any present blood clot or clotting disorder. She does not currently have a PCP. No hx of DM. Past Medical History  Diagnosis Date  . Hypertension   . Depression   . Type 2 diabetes  mellitus   . Diverticulitis   . Asthma   . Cyst     pt has "cyst" to legs for years, areas drain at time  . Pancreatitis   . Arthritis   . C. difficile colitis 11/08/2011  . Musculoskeletal pain 11/08/2011  . Neuropathic pain 11/10/2011  . Sleep apnea with use of continuous positive airway pressure (CPAP)     uses CPAP at night  . Benign lipomatous neoplasm of skin, subcu of right leg 02/17/2013   Past Surgical History  Procedure Laterality Date  . Tonsillectomy    . Cholecystectomy    . Cesarean section    . Nose surgery     History reviewed. No pertinent family history. History  Substance Use Topics  . Smoking status: Never Smoker   . Smokeless tobacco: Never Used  . Alcohol Use: No   OB History   Grav Para Term Preterm Abortions TAB SAB Ect Mult Living                 Review of Systems  Constitutional: Positive for chills. Negative for fever.  Cardiovascular: Positive for leg swelling.  Gastrointestinal: Positive for abdominal pain. Negative for nausea and vomiting.  Genitourinary: Positive for menstrual problem.  Hematological: Does not bruise/bleed easily.  Psychiatric/Behavioral: The patient is nervous/anxious.   All other systems reviewed and are negative.    Allergies  Nitroglycerin; Dilaudid; Levaquin; and Sulfa antibiotics  Home Medications   Current Outpatient Rx  Name  Route  Sig  Dispense  Refill  . albuterol (PROVENTIL HFA;VENTOLIN HFA) 108 (90 BASE) MCG/ACT inhaler   Inhalation   Inhale 2 puffs into the lungs every 4 (four) hours as needed for wheezing or shortness of breath (or coughing).   1 Inhaler   0   . B Complex-Biotin-FA (B-COMPLEX PO)   Oral   Take 2 tablets by mouth once a week.         Marland Kitchen buPROPion (WELLBUTRIN XL) 300 MG 24 hr tablet   Oral   Take 300 mg by mouth daily.          . busPIRone (BUSPAR) 15 MG tablet   Oral   Take 15 mg by mouth 3 (three) times daily.          . clotrimazole-betamethasone (LOTRISONE) cream    Topical   Apply 1 application topically 2 (two) times daily.         . cyclobenzaprine (FLEXERIL) 10 MG tablet   Oral   Take 1 tablet (10 mg total) by mouth 2 (two) times daily as needed for muscle spasms.   15 tablet   0   . escitalopram (LEXAPRO) 20 MG tablet   Oral   Take 20 mg by mouth daily.           . hydrochlorothiazide (HYDRODIURIL) 25 MG tablet   Oral   Take 25 mg by mouth daily.          Marland Kitchen ibuprofen (ADVIL,MOTRIN) 200 MG tablet   Oral   Take 800 mg by mouth every 6 (six) hours as needed for pain.         Marland Kitchen loperamide (IMODIUM) 2 MG capsule   Oral   Take 2 mg by mouth daily.          Marland Kitchen LORazepam (ATIVAN) 1 MG tablet   Oral   Take 1 mg by mouth 4 (four) times daily as needed for anxiety.          . potassium chloride (K-DUR) 10 MEQ tablet   Oral   Take 1 tablet (10 mEq total) by mouth 2 (two) times daily.   60 tablet   0    BP 181/58  Pulse 78  Temp(Src) 98.2 F (36.8 C) (Oral)  Resp 22  SpO2 97%  LMP 03/17/2013 Physical Exam  Nursing note and vitals reviewed. Constitutional: She is oriented to person, place, and time. She appears well-developed and well-nourished.  Non-toxic appearance. She does not appear ill. No distress.  Morbid obese   HENT:  Head: Normocephalic and atraumatic.  Right Ear: External ear normal.  Left Ear: External ear normal.  Nose: Nose normal. No mucosal edema or rhinorrhea.  Mouth/Throat: Oropharynx is clear and moist and mucous membranes are normal. No dental abscesses or edematous.  Eyes: Conjunctivae and EOM are normal. Pupils are equal, round, and reactive to light. No scleral icterus.  Neck: Normal range of motion and full passive range of motion without pain. Neck supple.  Cardiovascular: Normal rate, regular rhythm, normal heart sounds and intact distal pulses.  Exam reveals no gallop and no friction rub.   No murmur heard. Pulmonary/Chest: Effort normal and breath sounds normal. No respiratory distress. She  has no wheezes. She has no rhonchi. She has no rales. She exhibits no tenderness and no crepitus.  Abdominal: Soft. Normal appearance and bowel sounds are normal. There is no rebound and no guarding.  Musculoskeletal: Normal  range of motion. She exhibits no edema and no tenderness.  Right lower extremity  erythema around the calf region that presents with skin induration Tenderness with palpitation Neurovascularly intact   Lymphadenopathy:    She has no cervical adenopathy.  Neurological: She is alert and oriented to person, place, and time. She has normal strength. No cranial nerve deficit.  Skin: Skin is warm, dry and intact. No rash noted. She is not diaphoretic. No erythema. No pallor.  Psychiatric: She has a normal mood and affect. Her speech is normal and behavior is normal. Her mood appears not anxious.    ED Course  DIAGNOSTIC STUDIES: Oxygen Saturation is 97% on room air, adequate by my interpretation.    COORDINATION OF CARE: 9:40 AM Discussed course of care with pt . Pt understands and agrees.  Procedures (including critical care time)  Labs Reviewed - No data to display No results found. No diagnosis found.  MDM  I personally performed the services described in this documentation, which was scribed in my presence. The recorded information has been reviewed and is accurate.  Pt comes in with cc of leg pain and heavy period. ROS is + for chills. Pt is morbidly obese, no immunocompromised status and has recurrent cellulitis per hx and patient. Vitals are stable.  Exam shows no crepitus, no raging erythema, she had an [place] before that showed no abscess, and currently, no clinical findings consistent with abscess.  Will give iv vanc in the ED. Pt will be dc with bactrim and keflex. We will demarcate the lesion. She will get few pain meds to help with the acute flare up.  Return precautions discussed verbally. Pt will be given outpatient f/u information as well.  Derwood Kaplan, MD 03/17/13 1052

## 2013-03-17 NOTE — ED Notes (Signed)
Pt had stated that she was driving and pt informed that she should not drive due to receiving Fentanyl while here, pt verbalized understanding, pt left while friend was driving

## 2013-03-17 NOTE — ED Notes (Signed)
Morbidly obese female w/R inner thigh cellulitis that was treated at [facility] with IV antbx 4 months ago.  Since then has had recurrent problems with same. Pain 10/10 at site.  Also c/o increased menstrual flow with this cycle that stated 2 days ago.  States she has used 20 pads and this morning noted large spot of blood on floor upon standing.

## 2013-03-17 NOTE — ED Notes (Signed)
Pt c/o "cellulitis flare up' started yesterday to right upper leg. Is chronic. otc meds with no relief. nad at this time. Pt grimacing.

## 2013-03-28 ENCOUNTER — Other Ambulatory Visit: Payer: Self-pay

## 2013-03-28 ENCOUNTER — Encounter (HOSPITAL_COMMUNITY): Payer: Self-pay

## 2013-03-28 ENCOUNTER — Emergency Department (HOSPITAL_COMMUNITY)
Admission: EM | Admit: 2013-03-28 | Discharge: 2013-03-28 | Disposition: A | Discharge status: Home / Self Care | Payer: Medicaid Other | Attending: Emergency Medicine | Admitting: Emergency Medicine

## 2013-03-28 DIAGNOSIS — F3289 Other specified depressive episodes: Secondary | ICD-10-CM | POA: Insufficient documentation

## 2013-03-28 DIAGNOSIS — Z8619 Personal history of other infectious and parasitic diseases: Secondary | ICD-10-CM | POA: Insufficient documentation

## 2013-03-28 DIAGNOSIS — Z8739 Personal history of other diseases of the musculoskeletal system and connective tissue: Secondary | ICD-10-CM | POA: Insufficient documentation

## 2013-03-28 DIAGNOSIS — I10 Essential (primary) hypertension: Secondary | ICD-10-CM | POA: Insufficient documentation

## 2013-03-28 DIAGNOSIS — J45909 Unspecified asthma, uncomplicated: Secondary | ICD-10-CM | POA: Insufficient documentation

## 2013-03-28 DIAGNOSIS — L0390 Cellulitis, unspecified: Secondary | ICD-10-CM

## 2013-03-28 DIAGNOSIS — R609 Edema, unspecified: Secondary | ICD-10-CM | POA: Insufficient documentation

## 2013-03-28 DIAGNOSIS — E119 Type 2 diabetes mellitus without complications: Secondary | ICD-10-CM | POA: Insufficient documentation

## 2013-03-28 DIAGNOSIS — M129 Arthropathy, unspecified: Secondary | ICD-10-CM | POA: Insufficient documentation

## 2013-03-28 DIAGNOSIS — F329 Major depressive disorder, single episode, unspecified: Secondary | ICD-10-CM | POA: Insufficient documentation

## 2013-03-28 DIAGNOSIS — M79609 Pain in unspecified limb: Secondary | ICD-10-CM | POA: Insufficient documentation

## 2013-03-28 DIAGNOSIS — Z8742 Personal history of other diseases of the female genital tract: Secondary | ICD-10-CM | POA: Insufficient documentation

## 2013-03-28 DIAGNOSIS — Z79899 Other long term (current) drug therapy: Secondary | ICD-10-CM | POA: Insufficient documentation

## 2013-03-28 DIAGNOSIS — G4733 Obstructive sleep apnea (adult) (pediatric): Secondary | ICD-10-CM | POA: Insufficient documentation

## 2013-03-28 DIAGNOSIS — Z8719 Personal history of other diseases of the digestive system: Secondary | ICD-10-CM | POA: Insufficient documentation

## 2013-03-28 DIAGNOSIS — Z872 Personal history of diseases of the skin and subcutaneous tissue: Secondary | ICD-10-CM | POA: Insufficient documentation

## 2013-03-28 DIAGNOSIS — L02419 Cutaneous abscess of limb, unspecified: Secondary | ICD-10-CM | POA: Insufficient documentation

## 2013-03-28 LAB — URINALYSIS, ROUTINE W REFLEX MICROSCOPIC
Ketones, ur: NEGATIVE mg/dL
Leukocytes, UA: NEGATIVE
Nitrite: NEGATIVE
Protein, ur: NEGATIVE mg/dL
Urobilinogen, UA: 0.2 mg/dL (ref 0.0–1.0)

## 2013-03-28 LAB — CBC WITH DIFFERENTIAL/PLATELET
Basophils Absolute: 0 10*3/uL (ref 0.0–0.1)
Basophils Relative: 1 % (ref 0–1)
Eosinophils Absolute: 0.1 10*3/uL (ref 0.0–0.7)
Eosinophils Relative: 2 % (ref 0–5)
Lymphocytes Relative: 30 % (ref 12–46)
MCHC: 32.7 g/dL (ref 30.0–36.0)
MCV: 87.4 fL (ref 78.0–100.0)
Platelets: 261 10*3/uL (ref 150–400)
RDW: 13.2 % (ref 11.5–15.5)
WBC: 5.2 10*3/uL (ref 4.0–10.5)

## 2013-03-28 LAB — BASIC METABOLIC PANEL
CO2: 28 mEq/L (ref 19–32)
Calcium: 9.9 mg/dL (ref 8.4–10.5)
Creatinine, Ser: 0.88 mg/dL (ref 0.50–1.10)
GFR calc Af Amer: 88 mL/min — ABNORMAL LOW (ref >90)
GFR calc non Af Amer: 76 mL/min — ABNORMAL LOW (ref >90)
Sodium: 140 mEq/L (ref 135–145)

## 2013-03-28 LAB — URINE MICROSCOPIC-ADD ON

## 2013-03-28 MED ORDER — CLINDAMYCIN HCL 150 MG PO CAPS: 450.0000 mg | ORAL_CAPSULE | Freq: Three times a day (TID) | ORAL | Status: DC

## 2013-03-28 MED ORDER — VANCOMYCIN HCL IN DEXTROSE 1-5 GM/200ML-% IV SOLN
1000.0000 mg | Freq: Once | INTRAVENOUS | Status: AC
  Administered 2013-03-28: 1000 mg via INTRAVENOUS
  Filled 2013-03-28: qty 200

## 2013-03-28 MED ORDER — OXYCODONE-ACETAMINOPHEN 5-325 MG PO TABS: 2.0000 | ORAL_TABLET | ORAL | Status: DC | PRN

## 2013-03-28 MED ORDER — FENTANYL CITRATE 0.05 MG/ML IJ SOLN
50.0000 ug | Freq: Once | INTRAMUSCULAR | Status: AC
  Administered 2013-03-28: 50 ug via INTRAVENOUS
  Filled 2013-03-28: qty 2

## 2013-03-28 NOTE — ED Notes (Signed)
Pt request RX for pain medication EDP aware. Pt waiting for referral to [facility]

## 2013-03-28 NOTE — ED Provider Notes (Signed)
CSN: [id number]     Arrival date & time 03/28/13  8469 History   First MD Initiated Contact with Patient 03/28/13 [phone]     Chief Complaint  Patient presents with  . Wound Check   (Consider location/radiation/quality/duration/timing/severity/associated sxs/prior Treatment) HPI Stacey [name] is a 52 y.o. morbidly obese female who presents to the ED for right upper leg pain. She has recurrent problems with cellulitis to the right thigh. She has been hospitalized here for IV Vancomycin and has been evaluated at [facility] for same. She was to return to [facility] today but called them due to transportation issues and told to come here. She reports that the pain and redness is worse and there is an open area of the skin on the back of her leg. She is taking antibiotics but not any improvement. Patient with multiple medical problems including HTN, Diabetes, Pancreatitis, sleep apnea and neuropathic pain.   Past Medical History  Diagnosis Date  . Hypertension   . Depression   . Type 2 diabetes mellitus   . Diverticulitis   . Asthma   . Cyst     pt has "cyst" to legs for years, areas drain at time  . Pancreatitis   . Arthritis   . C. difficile colitis 11/08/2011  . Musculoskeletal pain 11/08/2011  . Neuropathic pain 11/10/2011  . Sleep apnea with use of continuous positive airway pressure (CPAP)     uses CPAP at night  . Benign lipomatous neoplasm of skin, subcu of right leg 02/17/2013   Past Surgical History  Procedure Laterality Date  . Tonsillectomy    . Cholecystectomy    . Cesarean section    . Nose surgery     No family history on file. History  Substance Use Topics  . Smoking status: Never Smoker   . Smokeless tobacco: Never Used  . Alcohol Use: No   OB History   Grav Para Term Preterm Abortions TAB SAB Ect Mult Living                 Review of Systems  Constitutional: Negative for fever.  Respiratory: Negative for cough.   Cardiovascular: Negative for chest pain.    Gastrointestinal: Negative for nausea, vomiting and abdominal pain.  Musculoskeletal:       Pain and redness to right upper leg.  Skin: Positive for wound.  Neurological: Negative for light-headedness and headaches.  Psychiatric/Behavioral: The patient is not nervous/anxious.     Allergies  Nitroglycerin; Dilaudid; Levaquin; and Sulfa antibiotics  Home Medications   Current Outpatient Rx  Name  Route  Sig  Dispense  Refill  . B Complex-Biotin-FA (B-COMPLEX PO)   Oral   Take 2 tablets by mouth once a week.         Marland Kitchen buPROPion (WELLBUTRIN XL) 300 MG 24 hr tablet   Oral   Take 300 mg by mouth daily.          . busPIRone (BUSPAR) 15 MG tablet   Oral   Take 15 mg by mouth 3 (three) times daily.          . cephALEXin (KEFLEX) 500 MG capsule   Oral   Take 1 capsule (500 mg total) by mouth 4 (four) times daily.   40 capsule   0   . clotrimazole-betamethasone (LOTRISONE) cream   Topical   Apply 1 application topically 2 (two) times daily.         . cyclobenzaprine (FLEXERIL) 10 MG tablet  Oral   Take 1 tablet (10 mg total) by mouth 2 (two) times daily as needed for muscle spasms.   15 tablet   0   . escitalopram (LEXAPRO) 20 MG tablet   Oral   Take 20 mg by mouth daily.           . hydrochlorothiazide (HYDRODIURIL) 25 MG tablet   Oral   Take 25 mg by mouth daily.          Marland Kitchen ibuprofen (ADVIL,MOTRIN) 200 MG tablet   Oral   Take 800 mg by mouth every 6 (six) hours as needed for pain.         Marland Kitchen loperamide (IMODIUM) 2 MG capsule   Oral   Take 2 mg by mouth daily.          Marland Kitchen LORazepam (ATIVAN) 1 MG tablet   Oral   Take 1 mg by mouth 4 (four) times daily as needed for anxiety.          . potassium chloride (K-DUR) 10 MEQ tablet   Oral   Take 1 tablet (10 mEq total) by mouth 2 (two) times daily.   60 tablet   0   . albuterol (PROVENTIL HFA;VENTOLIN HFA) 108 (90 BASE) MCG/ACT inhaler   Inhalation   Inhale 2 puffs into the lungs every 4 (four)  hours as needed for wheezing or shortness of breath (or coughing).   1 Inhaler   0   . HYDROcodone-acetaminophen (NORCO/VICODIN) 5-325 MG per tablet   Oral   Take 1 tablet by mouth every 6 (six) hours as needed for pain.   15 tablet   0    BP 145/55  Pulse 66  Temp(Src) 97.8 F (36.6 C) (Oral)  Resp 20  Ht 5\' 6"  (1.676 m)  Wt 445 lb (201.851 kg)  BMI 71.86 kg/m2  SpO2 99%  LMP 03/17/2013 Physical Exam  Nursing note and vitals reviewed. Constitutional: She is oriented to person, place, and time. No distress.  Morbidly obese   HENT:  Head: Normocephalic.  Eyes: EOM are normal.  Neck: Neck supple.  Cardiovascular: Normal rate and regular rhythm.   Pulmonary/Chest: Effort normal and breath sounds normal.  Abdominal: Soft. There is no tenderness.  Musculoskeletal:       Right upper leg: She exhibits tenderness and edema.       Legs: Large area of erythema and small blisters to the inner aspect of the right upper leg. Tender with palpation. There is an open area of the skin noted in the posterior aspect. Pedal pulse present, adequate circulation   Neurological: She is alert and oriented to person, place, and time. No cranial nerve deficit.  Skin: There is erythema.  Psychiatric: She has a normal mood and affect. Her behavior is normal.    ED Course  Procedures Results for orders placed during the hospital encounter of 03/28/13 (from the past 24 hour(s))  CBC WITH DIFFERENTIAL     Status: Abnormal   Collection Time    03/28/13 10:49 AM      Result Value Range   WBC 5.2  4.0 - 10.5 K/uL   RBC 3.88  3.87 - 5.11 MIL/uL   Hemoglobin 11.1 (*) 12.0 - 15.0 g/dL   HCT 78.2 (*) 95.6 - 21.3 %   MCV 87.4  78.0 - 100.0 fL   MCH 28.6  26.0 - 34.0 pg   MCHC 32.7  30.0 - 36.0 g/dL   RDW 08.6  57.8 - 46.9 %  Platelets 261  150 - 400 K/uL   Neutrophils Relative % 62  43 - 77 %   Neutro Abs 3.2  1.7 - 7.7 K/uL   Lymphocytes Relative 30  12 - 46 %   Lymphs Abs 1.5  0.7 - 4.0 K/uL    Monocytes Relative 6  3 - 12 %   Monocytes Absolute 0.3  0.1 - 1.0 K/uL   Eosinophils Relative 2  0 - 5 %   Eosinophils Absolute 0.1  0.0 - 0.7 K/uL   Basophils Relative 1  0 - 1 %   Basophils Absolute 0.0  0.0 - 0.1 K/uL  BASIC METABOLIC PANEL     Status: Abnormal   Collection Time    03/28/13 10:49 AM      Result Value Range   Sodium 140  135 - 145 mEq/L   Potassium 4.3  3.5 - 5.1 mEq/L   Chloride 103  96 - 112 mEq/L   CO2 28  19 - 32 mEq/L   Glucose, Bld 119 (*) 70 - 99 mg/dL   BUN 17  6 - 23 mg/dL   Creatinine, Ser 1.61  0.50 - 1.10 mg/dL   Calcium 9.9  8.4 - 09.6 mg/dL   GFR calc non Af Amer 76 (*) >90 mL/min   GFR calc Af Amer 88 (*) >90 mL/min  URINALYSIS, ROUTINE W REFLEX MICROSCOPIC     Status: Abnormal   Collection Time    03/28/13 11:16 AM      Result Value Range   Color, Urine YELLOW  YELLOW   APPearance CLEAR  CLEAR   Specific Gravity, Urine >1.030 (*) 1.005 - 1.030   pH 6.0  5.0 - 8.0   Glucose, UA NEGATIVE  NEGATIVE mg/dL   Hgb urine dipstick SMALL (*) NEGATIVE   Bilirubin Urine NEGATIVE  NEGATIVE   Ketones, ur NEGATIVE  NEGATIVE mg/dL   Protein, ur NEGATIVE  NEGATIVE mg/dL   Urobilinogen, UA 0.2  0.0 - 1.0 mg/dL   Nitrite NEGATIVE  NEGATIVE   Leukocytes, UA NEGATIVE  NEGATIVE  URINE MICROSCOPIC-ADD ON     Status: None   Collection Time    03/28/13 11:16 AM      Result Value Range   Squamous Epithelial / LPF RARE  RARE   RBC / HPF 3-6  <3 RBC/hpf    MDM: discussed with Hospitalitis and he will see the patient in the ED  52 y.o. morbidly obese female with cellulitis of the right upper leg. Labs, Vancomycin started in ED.    [place], NP 03/28/13 [address] [place], [place] 03/28/13 1227

## 2013-03-28 NOTE — Progress Notes (Signed)
CARE MANAGEMENT ED NOTE 03/28/2013  Patient:  Stacey [name], Stacey [name]   Account Number:  [id number]  Date Initiated:  03/28/2013  Documentation initiated by:  Anibal Henderson  Subjective/Objective Assessment:   Pt presented to ED with cellulitis of the leg. This is a chronic situation due to a cyst on the leg that needs to have surgical attention. pt needs a PCP to follow anfd refer to [facility] for possible surgery.     Subjective/Objective Assessment Detail:   Per Toya Smothers, NP, pt had appointment with the [facility] but did not keep this appt. She would like for pt to be referred again. Appointment made for Tuesday Sept 2 at 11 am. Spoke with pt and she missed last appt due to car being out of commission. She hopes to be able to keep this appt. Information will be faxed to the clinic when finished in the computer     Action/Plan:   Action/Plan Detail:   Anticipated DC Date:  03/28/2013     Status Recommendation to Physician:   Result of Recommendation:        Choice offered to / List presented to:            Status of service:  Completed, signed off  ED Comments:  03/28/13 1500 Anibal Henderson RN/EDCM  ED Comments Detail:

## 2013-03-28 NOTE — ED Provider Notes (Signed)
Medical screening examination/treatment/procedure(s) were conducted as a shared visit with non-physician practitioner(s) and myself.  I personally evaluated the patient during the encounter  Patient here with cellulitis of her right thigh. By wound Center, however has not been there quite some time. Patient states when the smelling bad. She denies any systemic symptoms. Patient is morbidly obese and cannot clean herself. She is white count, hives cellulitis of that right thigh. Will admit. After admission completed and patient awaiting bed, patient refused admission. She stated some edema but home and does not want to stay. She understands the risks. She's given clindamycin. Stable for discharge.  Dagmar Hait, MD 03/28/13 [phone]

## 2013-03-28 NOTE — ED Notes (Signed)
Pt here for recheck of wound on right leg

## 2013-04-23 ENCOUNTER — Emergency Department: Payer: Self-pay | Admitting: Emergency Medicine

## 2013-04-23 LAB — URINALYSIS, COMPLETE
Bilirubin,UR: NEGATIVE
Glucose,UR: NEGATIVE mg/dL (ref 0–75)
Ph: 5 (ref 4.5–8.0)
Protein: NEGATIVE
RBC,UR: 44 /HPF (ref 0–5)
Specific Gravity: 1.021 (ref 1.003–1.030)
Squamous Epithelial: 14
Transitional Epi: 1
WBC UR: 87 /HPF (ref 0–5)

## 2013-04-23 LAB — CBC WITH DIFFERENTIAL/PLATELET
Basophil #: 0 10*3/uL (ref 0.0–0.1)
Basophil %: 0.4 %
Eosinophil #: 0.1 10*3/uL (ref 0.0–0.7)
Eosinophil %: 1.7 %
HCT: 35.4 % (ref 35.0–47.0)
HGB: 11.9 g/dL — ABNORMAL LOW (ref 12.0–16.0)
Lymphocyte #: 2 10*3/uL (ref 1.0–3.6)
Lymphocyte %: 22.5 %
MCH: 28.6 pg (ref 26.0–34.0)
MCHC: 33.6 g/dL (ref 32.0–36.0)
Monocyte #: 0.5 x10 3/mm (ref 0.2–0.9)
Monocyte %: 5.3 %
Neutrophil #: 6.2 10*3/uL (ref 1.4–6.5)
Neutrophil %: 70.1 %
RBC: 4.16 10*6/uL (ref 3.80–5.20)
WBC: 8.8 10*3/uL (ref 3.6–11.0)

## 2013-04-23 LAB — COMPREHENSIVE METABOLIC PANEL
Alkaline Phosphatase: 80 U/L (ref 50–136)
Bilirubin,Total: 0.5 mg/dL (ref 0.2–1.0)
Calcium, Total: 9.6 mg/dL (ref 8.5–10.1)
Chloride: 106 mmol/L (ref 98–107)
Co2: 26 mmol/L (ref 21–32)
Creatinine: 1.02 mg/dL (ref 0.60–1.30)
EGFR (African American): >60
Glucose: 111 mg/dL — ABNORMAL HIGH (ref 65–99)
Osmolality: 280 (ref 275–301)
SGOT(AST): 19 U/L (ref 15–37)
Sodium: 138 mmol/L (ref 136–145)

## 2013-05-10 ENCOUNTER — Emergency Department: Payer: Self-pay | Admitting: Internal Medicine

## 2013-05-10 LAB — CBC WITH DIFFERENTIAL/PLATELET
Basophil #: 0.1 10*3/uL (ref 0.0–0.1)
Basophil %: 1.2 %
Eosinophil #: 0.2 10*3/uL (ref 0.0–0.7)
Eosinophil %: 2.6 %
HCT: 34.8 % — ABNORMAL LOW (ref 35.0–47.0)
Lymphocyte #: 1.6 10*3/uL (ref 1.0–3.6)
Lymphocyte %: 18.6 %
MCH: 28.9 pg (ref 26.0–34.0)
MCHC: 34.1 g/dL (ref 32.0–36.0)
MCV: 85 fL (ref 80–100)
Monocyte #: 0.5 x10 3/mm (ref 0.2–0.9)
Neutrophil #: 6.1 10*3/uL (ref 1.4–6.5)
Neutrophil %: 72 %
RDW: 13 % (ref 11.5–14.5)
WBC: 8.5 10*3/uL (ref 3.6–11.0)

## 2013-05-11 ENCOUNTER — Emergency Department: Payer: Self-pay | Admitting: Emergency Medicine

## 2013-05-11 LAB — COMPREHENSIVE METABOLIC PANEL
Albumin: 3.5 g/dL (ref 3.4–5.0)
Alkaline Phosphatase: 69 U/L (ref 50–136)
Anion Gap: 6 — ABNORMAL LOW (ref 7–16)
Bilirubin,Total: 0.7 mg/dL (ref 0.2–1.0)
Chloride: 104 mmol/L (ref 98–107)
Co2: 27 mmol/L (ref 21–32)
Creatinine: 0.72 mg/dL (ref 0.60–1.30)
EGFR (Non-African Amer.): >60
Osmolality: 274 (ref 275–301)
SGOT(AST): 22 U/L (ref 15–37)
SGPT (ALT): 27 U/L (ref 12–78)
Sodium: 137 mmol/L (ref 136–145)

## 2013-06-01 ENCOUNTER — Inpatient Hospital Stay (HOSPITAL_COMMUNITY): Payer: Medicaid Other

## 2013-06-01 ENCOUNTER — Inpatient Hospital Stay (HOSPITAL_COMMUNITY)
Admission: EM | Admit: 2013-06-01 | Discharge: 2013-06-03 | DRG: 603 | Disposition: A | Discharge status: Home / Self Care | Payer: Medicaid Other | Attending: Internal Medicine | Admitting: Internal Medicine

## 2013-06-01 ENCOUNTER — Encounter (HOSPITAL_COMMUNITY): Payer: Self-pay | Admitting: Emergency Medicine

## 2013-06-01 DIAGNOSIS — M129 Arthropathy, unspecified: Secondary | ICD-10-CM | POA: Diagnosis present

## 2013-06-01 DIAGNOSIS — L02419 Cutaneous abscess of limb, unspecified: Principal | ICD-10-CM | POA: Diagnosis present

## 2013-06-01 DIAGNOSIS — D649 Anemia, unspecified: Secondary | ICD-10-CM

## 2013-06-01 DIAGNOSIS — D1779 Benign lipomatous neoplasm of other sites: Secondary | ICD-10-CM | POA: Diagnosis present

## 2013-06-01 DIAGNOSIS — J45909 Unspecified asthma, uncomplicated: Secondary | ICD-10-CM | POA: Diagnosis present

## 2013-06-01 DIAGNOSIS — F341 Dysthymic disorder: Secondary | ICD-10-CM

## 2013-06-01 DIAGNOSIS — G4733 Obstructive sleep apnea (adult) (pediatric): Secondary | ICD-10-CM | POA: Diagnosis present

## 2013-06-01 DIAGNOSIS — E119 Type 2 diabetes mellitus without complications: Secondary | ICD-10-CM | POA: Diagnosis present

## 2013-06-01 DIAGNOSIS — L97909 Non-pressure chronic ulcer of unspecified part of unspecified lower leg with unspecified severity: Secondary | ICD-10-CM | POA: Diagnosis present

## 2013-06-01 DIAGNOSIS — F3289 Other specified depressive episodes: Secondary | ICD-10-CM | POA: Diagnosis present

## 2013-06-01 DIAGNOSIS — F329 Major depressive disorder, single episode, unspecified: Secondary | ICD-10-CM | POA: Diagnosis present

## 2013-06-01 DIAGNOSIS — I10 Essential (primary) hypertension: Secondary | ICD-10-CM | POA: Diagnosis present

## 2013-06-01 DIAGNOSIS — L03115 Cellulitis of right lower limb: Secondary | ICD-10-CM

## 2013-06-01 DIAGNOSIS — Z6841 Body Mass Index (BMI) 40.0 and over, adult: Secondary | ICD-10-CM

## 2013-06-01 DIAGNOSIS — D1723 Benign lipomatous neoplasm of skin and subcutaneous tissue of right leg: Secondary | ICD-10-CM

## 2013-06-01 DIAGNOSIS — D1739 Benign lipomatous neoplasm of skin and subcutaneous tissue of other sites: Secondary | ICD-10-CM

## 2013-06-01 LAB — COMPREHENSIVE METABOLIC PANEL
ALT: 19 U/L (ref 0–35)
Albumin: 3.5 g/dL (ref 3.5–5.2)
Alkaline Phosphatase: 74 U/L (ref 39–117)
BUN: 16 mg/dL (ref 6–23)
Calcium: 9.9 mg/dL (ref 8.4–10.5)
GFR calc Af Amer: >90 mL/min (ref >90)
Glucose, Bld: 135 mg/dL — ABNORMAL HIGH (ref 70–99)
Potassium: 3.9 mEq/L (ref 3.5–5.1)
Sodium: 134 mEq/L — ABNORMAL LOW (ref 135–145)
Total Protein: 7.2 g/dL (ref 6.0–8.3)

## 2013-06-01 LAB — CBC WITH DIFFERENTIAL/PLATELET
Basophils Relative: 0 % (ref 0–1)
Eosinophils Absolute: 0.1 10*3/uL (ref 0.0–0.7)
Eosinophils Relative: 2 % (ref 0–5)
MCH: 28.4 pg (ref 26.0–34.0)
MCHC: 32.9 g/dL (ref 30.0–36.0)
MCV: 86.5 fL (ref 78.0–100.0)
Neutrophils Relative %: 70 % (ref 43–77)
Platelets: 301 10*3/uL (ref 150–400)

## 2013-06-01 LAB — GLUCOSE, CAPILLARY: Glucose-Capillary: 148 mg/dL — ABNORMAL HIGH (ref 70–99)

## 2013-06-01 MED ORDER — RISAQUAD PO CAPS
2.0000 | ORAL_CAPSULE | Freq: Every day | ORAL | Status: DC
  Administered 2013-06-02 – 2013-06-03 (×2): 2 via ORAL
  Filled 2013-06-01 (×2): qty 2

## 2013-06-01 MED ORDER — VANCOMYCIN HCL IN DEXTROSE 1-5 GM/200ML-% IV SOLN
1000.0000 mg | INTRAVENOUS | Status: AC
  Administered 2013-06-01 (×2): 1000 mg via INTRAVENOUS
  Filled 2013-06-01 (×2): qty 200

## 2013-06-01 MED ORDER — BUPROPION HCL ER (XL) 300 MG PO TB24
300.0000 mg | ORAL_TABLET | Freq: Every day | ORAL | Status: DC
  Administered 2013-06-02 – 2013-06-03 (×2): 300 mg via ORAL
  Filled 2013-06-01 (×4): qty 1

## 2013-06-01 MED ORDER — FLUCONAZOLE 100 MG PO TABS
150.0000 mg | ORAL_TABLET | Freq: Every day | ORAL | Status: DC
  Administered 2013-06-02 – 2013-06-03 (×2): 150 mg via ORAL
  Filled 2013-06-01 (×2): qty 2

## 2013-06-01 MED ORDER — IOHEXOL 300 MG/ML  SOLN
100.0000 mL | Freq: Once | INTRAMUSCULAR | Status: AC | PRN
  Administered 2013-06-01: 100 mL via INTRAVENOUS

## 2013-06-01 MED ORDER — INSULIN ASPART 100 UNIT/ML ~~LOC~~ SOLN
0.0000 [IU] | Freq: Three times a day (TID) | SUBCUTANEOUS | Status: DC
  Administered 2013-06-02 – 2013-06-03 (×3): 1 [IU] via SUBCUTANEOUS

## 2013-06-01 MED ORDER — HYDROCHLOROTHIAZIDE 25 MG PO TABS
25.0000 mg | ORAL_TABLET | Freq: Every day | ORAL | Status: DC
  Filled 2013-06-01: qty 1

## 2013-06-01 MED ORDER — BUSPIRONE HCL 5 MG PO TABS
15.0000 mg | ORAL_TABLET | Freq: Three times a day (TID) | ORAL | Status: DC
  Administered 2013-06-01 – 2013-06-03 (×5): 15 mg via ORAL
  Filled 2013-06-01 (×6): qty 3

## 2013-06-01 MED ORDER — ENOXAPARIN SODIUM 80 MG/0.8ML ~~LOC~~ SOLN
80.0000 mg | SUBCUTANEOUS | Status: DC
  Administered 2013-06-02 – 2013-06-03 (×2): 80 mg via SUBCUTANEOUS
  Filled 2013-06-01 (×2): qty 0.8

## 2013-06-01 MED ORDER — ASPIRIN EC 81 MG PO TBEC
81.0000 mg | DELAYED_RELEASE_TABLET | Freq: Every day | ORAL | Status: DC
  Administered 2013-06-02 – 2013-06-03 (×2): 81 mg via ORAL
  Filled 2013-06-01 (×2): qty 1

## 2013-06-01 MED ORDER — MAGNESIUM CITRATE PO SOLN: 1.0000 | Freq: Once | ORAL | Status: AC | PRN

## 2013-06-01 MED ORDER — SORBITOL 70 % SOLN: 30.0000 mL | Freq: Every day | Status: DC | PRN

## 2013-06-01 MED ORDER — VANCOMYCIN HCL 10 G IV SOLR
1500.0000 mg | Freq: Two times a day (BID) | INTRAVENOUS | Status: DC
  Filled 2013-06-01 (×3): qty 1500

## 2013-06-01 MED ORDER — IBUPROFEN 800 MG PO TABS
800.0000 mg | ORAL_TABLET | Freq: Three times a day (TID) | ORAL | Status: DC | PRN
  Administered 2013-06-01 – 2013-06-02 (×2): 800 mg via ORAL
  Filled 2013-06-01 (×2): qty 1

## 2013-06-01 MED ORDER — OXYCODONE HCL 5 MG PO TABS
5.0000 mg | ORAL_TABLET | ORAL | Status: DC | PRN
  Administered 2013-06-02 – 2013-06-03 (×4): 5 mg via ORAL
  Filled 2013-06-01 (×5): qty 1

## 2013-06-01 MED ORDER — ONDANSETRON HCL 4 MG/2ML IJ SOLN: 4.0000 mg | INTRAMUSCULAR | Status: DC | PRN

## 2013-06-01 MED ORDER — VANCOMYCIN HCL IN DEXTROSE 1-5 GM/200ML-% IV SOLN
INTRAVENOUS | Status: AC
  Filled 2013-06-01: qty 400

## 2013-06-01 MED ORDER — INSULIN ASPART 100 UNIT/ML ~~LOC~~ SOLN: 0.0000 [IU] | Freq: Every day | SUBCUTANEOUS | Status: DC

## 2013-06-01 MED ORDER — ESCITALOPRAM OXALATE 10 MG PO TABS
20.0000 mg | ORAL_TABLET | Freq: Every day | ORAL | Status: DC
  Administered 2013-06-02 – 2013-06-03 (×2): 20 mg via ORAL
  Filled 2013-06-01 (×2): qty 2

## 2013-06-01 MED ORDER — PIPERACILLIN-TAZOBACTAM 3.375 G IVPB
INTRAVENOUS | Status: AC
  Filled 2013-06-01: qty 100

## 2013-06-01 MED ORDER — LORAZEPAM 1 MG PO TABS
1.0000 mg | ORAL_TABLET | Freq: Four times a day (QID) | ORAL | Status: DC | PRN
  Administered 2013-06-01 – 2013-06-02 (×3): 1 mg via ORAL
  Filled 2013-06-01 (×3): qty 1

## 2013-06-01 MED ORDER — POLYETHYLENE GLYCOL 3350 17 G PO PACK: 17.0000 g | PACK | Freq: Every day | ORAL | Status: DC | PRN

## 2013-06-01 MED ORDER — ACETAMINOPHEN 325 MG PO TABS: 650.0000 mg | ORAL_TABLET | ORAL | Status: DC | PRN

## 2013-06-01 MED ORDER — NYSTATIN 100000 UNIT/GM EX POWD
Freq: Two times a day (BID) | CUTANEOUS | Status: DC
  Administered 2013-06-02 (×2): via TOPICAL
  Administered 2013-06-03: 1 via TOPICAL
  Filled 2013-06-01: qty 15

## 2013-06-01 MED ORDER — PIPERACILLIN-TAZOBACTAM 3.375 G IVPB
3.3750 g | Freq: Three times a day (TID) | INTRAVENOUS | Status: DC
  Administered 2013-06-01 – 2013-06-02 (×2): 3.375 g via INTRAVENOUS
  Filled 2013-06-01 (×6): qty 50

## 2013-06-01 NOTE — Progress Notes (Signed)
ANTIBIOTIC CONSULT NOTE - INITIAL  Pharmacy Consult for Vancomycin & Zosyn Indication: Cellulitis right thigh  Allergies  Allergen Reactions  . Nitroglycerin Anxiety  . Dilaudid [Hydromorphone Hcl] Other (See Comments)    Hallucination  . Levaquin [Levofloxacin In D5w] Nausea And Vomiting  . Sulfa Antibiotics Rash    Patient Measurements: Height: 5\' 6"  (167.6 cm) Weight: 432 lb 4 oz (196.067 kg) IBW/kg (Calculated) : 59.3 Adjusted Body Weight:   Vital Signs: Temp: 98 F (36.7 C) (11/01 1524) Temp src: Oral (11/01 1524) BP: 168/59 mmHg (11/01 1719) Pulse Rate: 84 (11/01 1719) Intake/Output from previous day:   Intake/Output from this shift:    Labs:  Recent Labs  06/01/13 1715  WBC 7.8  HGB 11.6*  PLT 301  CREATININE 0.80   Estimated Creatinine Clearance: 153.1 ml/min (by C-G formula based on Cr of 0.8). No results found for this basename: VANCOTROUGH, VANCOPEAK, VANCORANDOM, GENTTROUGH, GENTPEAK, GENTRANDOM, TOBRATROUGH, TOBRAPEAK, TOBRARND, AMIKACINPEAK, AMIKACINTROU, AMIKACIN,  in the last 72 hours   Microbiology: No results found for this or any previous visit (from the past 720 hour(s)).  Medical History: Past Medical History  Diagnosis Date  . Hypertension   . Depression   . Type 2 diabetes mellitus   . Diverticulitis   . Asthma   . Cyst     pt has "cyst" to legs for years, areas drain at time  . Pancreatitis   . Arthritis   . C. difficile colitis 11/08/2011  . Musculoskeletal pain 11/08/2011  . Neuropathic pain 11/10/2011  . Sleep apnea with use of continuous positive airway pressure (CPAP)     uses CPAP at night  . Benign lipomatous neoplasm of skin, subcu of right leg 02/17/2013    Medications:  Scheduled:   Assessment: Obese patient Excellent renal function Chronic cellulitis worse over last week   Goal of Therapy:  Vancomycin trough level 10-15 mcg/ml  Plan:  Vancomycin 2 GM IV loading dose, then 1500 mg IV every 12 hours Zosyn 3.375  GM IV every 8 hours, infuse each dose over 4 hours Vancomycin trough at steady state Labs per protocol  Raquel James, Katrese Shell Bennett 06/01/2013,7:14 PM

## 2013-06-01 NOTE — ED Notes (Signed)
Meal tray given 

## 2013-06-01 NOTE — ED Notes (Signed)
Tech going with CT to take pt to the floor

## 2013-06-01 NOTE — ED Notes (Signed)
Pt has a large, basketball sized pendulous mass on right lower thigh that hangs down to calf and has some skin breakdown and drainage. Foul odor. Denies fever.

## 2013-06-01 NOTE — ED Notes (Signed)
Pt up in wheelchair to go to backroom, Ct ready for pt. Report given, Pt will go from CT to floor.

## 2013-06-01 NOTE — ED Notes (Signed)
Pt stated she has chronic cellulitis to right leg. Stated it has gotten worse over the last week. Has been draining --has a "black hole in it", no fever, mild nausea.

## 2013-06-01 NOTE — ED Provider Notes (Signed)
CSN: [id number]     Arrival date & time 06/01/13  1515 History   First MD Initiated Contact with Patient 06/01/13 1534     This chart was scribed for Ward Givens, MD by Arlan Organ, ED Scribe. This patient was seen in room APA19/APA19 and the patient's care was started 3:42 PM.   Chief Complaint  Patient presents with  . Recurrent Skin Infections   The history is provided by the patient. No language interpreter was used.   HPI Comments: Stacey [name] is a 52 y.o. Female with hx of cellulitis in her legs and DM who presents to the Emergency Department complaining of right leg cellulitis that has worsened over the last week. She reports she has had cellulitis of her right leg for the past few months with an open wound that was called a pressure old ulcer also for a few months. Pt states the area has been draining consisting of blood, pus, and clear fluid, and reports an open wound in the effected area. Pt also reports mild nausea. Pt denies care from the wound center for her cellulitis. Pt denies fever, but is having chills. She states she's not sure if it's more swollen, she also states it might be a little bit more painful. Pt states she currently has a yeast infection in her abdominal area and she took a diflucan yesterday for that. She reports she doesn't have a PCP and she was last admitted at [facility] about a month ago for the same infected area on her leg. She states she can't see it and isn't sure if the open area is bigger.   Pt denies any alcohol or drug use. 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   Pt reports side effects from numerous antibiotics that is mainly diarrhea.   PCP none Psychiatrist Dr. Dolores Frame   Past Medical History  Diagnosis Date  . Hypertension   . Depression   . Type 2 diabetes mellitus   . Diverticulitis   . Asthma   . Cyst     pt has "cyst" to legs for years, areas drain at time  . Pancreatitis   . Arthritis   . C.  difficile colitis 11/08/2011  . Musculoskeletal pain 11/08/2011  . Neuropathic pain 11/10/2011  . Sleep apnea with use of continuous positive airway pressure (CPAP)     uses CPAP at night  . Benign lipomatous neoplasm of skin, subcu of right leg 02/17/2013   Past Surgical History  Procedure Laterality Date  . Tonsillectomy    . Cholecystectomy    . Cesarean section    . Nose surgery     No family history on file. History  Substance Use Topics  . Smoking status: Never Smoker   . Smokeless tobacco: Never Used  . Alcohol Use: No   OB History   Grav Para Term Preterm Abortions TAB SAB Ect Mult Living                 Review of Systems  Skin: Positive for wound (cellulitis).  All other systems reviewed and are negative.    Allergies  Nitroglycerin; Dilaudid; Levaquin; and Sulfa antibiotics  Home Medications   Current Outpatient Rx  Name  Route  Sig  Dispense  Refill  . albuterol (PROVENTIL HFA;VENTOLIN HFA) 108 (90 BASE) MCG/ACT inhaler   Inhalation   Inhale 2 puffs into the lungs every 4 (four) hours as needed for wheezing or shortness of breath (or  coughing).   1 Inhaler   0   . B Complex-Biotin-FA (B-COMPLEX PO)   Oral   Take 2 tablets by mouth once a week.         Marland Kitchen buPROPion (WELLBUTRIN XL) 300 MG 24 hr tablet   Oral   Take 300 mg by mouth daily.          . busPIRone (BUSPAR) 15 MG tablet   Oral   Take 15 mg by mouth 3 (three) times daily.          . clotrimazole-betamethasone (LOTRISONE) cream   Topical   Apply 1 application topically 2 (two) times daily.         Marland Kitchen escitalopram (LEXAPRO) 20 MG tablet   Oral   Take 20 mg by mouth daily.           . hydrochlorothiazide (HYDRODIURIL) 25 MG tablet   Oral   Take 25 mg by mouth daily.          Marland Kitchen loperamide (IMODIUM) 2 MG capsule   Oral   Take 2 mg by mouth daily.          Marland Kitchen LORazepam (ATIVAN) 1 MG tablet   Oral   Take 1 mg by mouth 4 (four) times daily as needed for anxiety.            BP 151/57  Pulse 71  Temp(Src) 98 F (36.7 C) (Oral)  Resp 18  Ht 5\' 6"  (1.676 m)  Wt 432 lb 4 oz (196.067 kg)  BMI 69.8 kg/m2  SpO2 97%  LMP 05/11/2013  Vital signs normal    Physical Exam  Nursing note and vitals reviewed. Constitutional: She is oriented to person, place, and time. She appears well-developed and well-nourished.  Non-toxic appearance. She does not appear ill. No distress.  Morbidly obese  HENT:  Head: Normocephalic and atraumatic.  Right Ear: External ear normal.  Left Ear: External ear normal.  Nose: Nose normal. No mucosal edema or rhinorrhea.  Mouth/Throat: Oropharynx is clear and moist and mucous membranes are normal. No dental abscesses or uvula swelling.  Eyes: Conjunctivae and EOM are normal. Pupils are equal, round, and reactive to light.  Xanthelasma on upper right eye lid   Neck: Normal range of motion and full passive range of motion without pain. Neck supple.  Cardiovascular: Normal rate, regular rhythm and normal heart sounds.  Exam reveals no gallop and no friction rub.   No murmur heard. Pulmonary/Chest: Effort normal and breath sounds normal. No respiratory distress. She has no wheezes. She has no rhonchi. She has no rales. She exhibits no tenderness and no crepitus.  Abdominal: Soft. Normal appearance and bowel sounds are normal. She exhibits no distension. There is no tenderness. There is no rebound and no guarding.  Musculoskeletal: Normal range of motion. She exhibits no edema and no tenderness.  About 3 x 6 cm superficial ulcer on posterior medial aspect of upper right thigh that is small melon in size diffusely red and firm compared to her left thigh  Neurological: She is alert and oriented to person, place, and time. She has normal strength. No cranial nerve deficit.  Skin: Skin is warm, dry and intact. No rash noted. There is erythema. No pallor.  Redness under panus linear mild  redness corresponding under left proximal thigh    Psychiatric: She has a normal mood and affect. Her speech is normal and behavior is normal. Her mood appears not anxious.    ED Course  Procedures (including critical care time) Medications  vancomycin (VANCOCIN) IVPB 1000 mg/200 mL premix (not administered)  piperacillin-tazobactam (ZOSYN) IVPB 3.375 g (not administered)  vancomycin (VANCOCIN) 1,500 mg in sodium chloride 0.9 % 500 mL IVPB (not administered)  iohexol (OMNIPAQUE) 300 MG/ML solution 100 mL (100 mLs Intravenous Contrast Given 06/01/13 1941)    Vancomycin per pharmacy consult Zosyn per pharmacy consult  DIAGNOSTIC STUDIES: Oxygen Saturation is 97% on RA, Normal by my interpretation.    COORDINATION OF CARE: 3:41 PM- Discussed treatment plan with pt at bedside and pt agreed to plan.    18:13 Dr Rhona Leavens admit to med-surg, requests CT of her lower leg with contrast, recommends starting vancomycin and zosyn for antibotics.   6:50 PM- Pt informed being admitted and need for CT scan. Tearful because her uncle died of an infection.   Labs Review Results for orders placed during the hospital encounter of 06/01/13  CBC WITH DIFFERENTIAL      Result Value Range   WBC 7.8  4.0 - 10.5 K/uL   RBC 4.08  3.87 - 5.11 MIL/uL   Hemoglobin 11.6 (*) 12.0 - 15.0 g/dL   HCT 91.4 (*) 78.2 - 95.6 %   MCV 86.5  78.0 - 100.0 fL   MCH 28.4  26.0 - 34.0 pg   MCHC 32.9  30.0 - 36.0 g/dL   RDW 21.3  08.6 - 57.8 %   Platelets 301  150 - 400 K/uL   Neutrophils Relative % 70  43 - 77 %   Neutro Abs 5.5  1.7 - 7.7 K/uL   Lymphocytes Relative 20  12 - 46 %   Lymphs Abs 1.6  0.7 - 4.0 K/uL   Monocytes Relative 8  3 - 12 %   Monocytes Absolute 0.6  0.1 - 1.0 K/uL   Eosinophils Relative 2  0 - 5 %   Eosinophils Absolute 0.1  0.0 - 0.7 K/uL   Basophils Relative 0  0 - 1 %   Basophils Absolute 0.0  0.0 - 0.1 K/uL  COMPREHENSIVE METABOLIC PANEL      Result Value Range   Sodium 134 (*) 135 - 145 mEq/L   Potassium 3.9  3.5 - 5.1 mEq/L   Chloride  100  96 - 112 mEq/L   CO2 26  19 - 32 mEq/L   Glucose, Bld 135 (*) 70 - 99 mg/dL   BUN 16  6 - 23 mg/dL   Creatinine, Ser 4.69  0.50 - 1.10 mg/dL   Calcium 9.9  8.4 - 62.9 mg/dL   Total Protein 7.2  6.0 - 8.3 g/dL   Albumin 3.5  3.5 - 5.2 g/dL   AST 21  0 - 37 U/L   ALT 19  0 - 35 U/L   Alkaline Phosphatase 74  39 - 117 U/L   Total Bilirubin 0.3  0.3 - 1.2 mg/dL   GFR calc non Af Amer 85 (*) >90 mL/min   GFR calc Af Amer >90  >90 mL/min    Laboratory interpretation all normal except mild hyperglycemia    Imaging Review Ct Femur Right W Contrast  06/01/2013   CLINICAL DATA:  Large area swelling and drainage, long-standing but began draining a few days ago, cellulitis, history hypertension, diabetes, morbid obesity  EXAM: CT OF THE RIGHT LOWER EXTREMITY WITH CONTRAST  TECHNIQUE: Multidetector helical CT imaging of the lower extremities was performed following IV contrast. Sagittal coronal images reconstructed from axial data set.  CONTRAST:  OMNIPAQUE  IOHEXOL 300 MG/ML  SOLN  COMPARISON:  None  FINDINGS: Marked area of subcutaneous soft tissue swelling and edema is identified at the medial aspect of the mid to distal right thigh extending to the level of the knee.  Area of edema appears extra fascial with fat and muscular planes within the muscular fascia appearing patent and clear.  Marked dependent skin thickening up to 3.2 cm thick.  No discrete abscess collection is identified though small collections cannot be entirely excluded.  Vascular structures appear grossly patent.  Degenerative changes present at the right knee.  No soft tissue gas or radiopaque foreign body identified.  IMPRESSION: Area of marked soft tissue swelling and subcutaneous edema at the medial and posterior aspects of the mid to distal right thigh with associated marked skin thickening consistent with cellulitis.  No definite discrete abscess collection is identified though small infected collections are not entirely  excluded within the large areas of the observed edema.  Observed infiltrative findings appear extra fascial and no definite intramuscular or intrafascial soft tissue abnormality is identified.   Electronically Signed   By: Ulyses Southward M.D.   On: 06/01/2013 20:09      MDM   1. Cellulitis of right leg    PLAN Admission  I personally performed the services described in this documentation, which was scribed in my presence. The recorded information has been reviewed and considered.  Devoria Albe, MD, Armando Gang   Ward Givens, MD 06/01/13 2025

## 2013-06-01 NOTE — ED Notes (Signed)
Called floor to see if they are aware of pt weight, possibly another bed.

## 2013-06-01 NOTE — H&P (Signed)
[facility] History and Physical  Stacey [name]  ZOX:[id number]  DOB: [birth year]   DOA: 06/01/2013   PCP:   No PCP Per Patient   Chief Complaint:  Foul-smelling ulcer right lower extremity  HPI: Stacey [name] is a 52 y.o. female.   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 Because of her marked obesity it is difficult for her to attend to this local ulceration herself, and she is usually assisted by her sister. Today her sister told her that the area is looking worse than usual and is having a foul smell and she was advised to come to the emergency room for evaluation.  In the emergency room she had a CT scan of the thigh which suggested cellulitis and the hospitalist service was called for admission. She has had no fever or chills.  She is very distressed because she is unable to get surgery to remove the thigh mass  She was lost about 15 pounds over the past few months   Past Medical History  Diagnosis Date  . Hypertension   . Depression   . Type 2 diabetes mellitus   . Diverticulitis   . Asthma   . Cyst     pt has "cyst" to legs for years, areas drain at time  . Pancreatitis   . Arthritis   . C. difficile colitis 11/08/2011  . Musculoskeletal pain 11/08/2011  . Neuropathic pain 11/10/2011  . Sleep apnea with use of continuous positive airway pressure (CPAP)     uses CPAP at night  . Benign lipomatous neoplasm of skin, subcu of right leg 02/17/2013    Past Surgical History  Procedure Laterality Date  . Tonsillectomy    . Cholecystectomy    . Cesarean section    . Nose surgery      Medications:  HOME MEDS: Prior to  Admission medications   Medication Sig Start Date End Date Taking? Authorizing Provider  buPROPion (WELLBUTRIN XL) 300 MG 24 hr tablet Take 300 mg by mouth daily.    Yes Historical Provider, MD  busPIRone (BUSPAR) 15 MG tablet Take 15 mg by mouth 3 (three) times daily.    Yes Historical Provider, MD  clindamycin (CLEOCIN) 150 MG capsule Take 3 capsules (450 mg total) by mouth 3 (three) times daily. 03/28/13  Yes Dagmar Hait, MD  escitalopram (LEXAPRO) 20 MG tablet Take 20 mg by mouth daily.     Yes Historical Provider, MD  LORazepam (ATIVAN) 1 MG tablet Take 1 mg by mouth 4 (four) times daily as needed for anxiety.    Yes Historical Provider, MD  albuterol (PROVENTIL HFA;VENTOLIN HFA) 108 (90 BASE) MCG/ACT inhaler Inhale 2 puffs into the lungs every 4 (four) hours as needed for wheezing or shortness of breath (or coughing). 10/09/12   Dione Booze, MD  B Complex-Biotin-FA (B-COMPLEX PO) Take 2 tablets by mouth once a week.    Historical Provider, MD  cephALEXin (KEFLEX) 500 MG capsule Take 1 capsule (500 mg total) by mouth 4 (four) times daily. 03/17/13   Derwood Kaplan, MD  clotrimazole-betamethasone (LOTRISONE) cream Apply 1 application topically 2 (two) times daily. 02/04/13  Benny Lennert, MD  cyclobenzaprine (FLEXERIL) 10 MG tablet Take 1 tablet (10 mg total) by mouth 2 (two) times daily as needed for muscle spasms. 12/06/12   Hope Orlene Och, NP  hydrochlorothiazide (HYDRODIURIL) 25 MG tablet Take 25 mg by mouth daily.     Historical Provider, MD  HYDROcodone-acetaminophen (NORCO/VICODIN) 5-325 MG per tablet Take 1 tablet by mouth every 6 (six) hours as needed for pain. 03/17/13   Derwood Kaplan, MD  ibuprofen (ADVIL,MOTRIN) 200 MG tablet Take 800 mg by mouth every 6 (six) hours as needed for pain.    Historical Provider, MD  loperamide (IMODIUM) 2 MG capsule Take 2 mg by mouth daily.     Historical Provider, MD     Allergies:  Allergies  Allergen Reactions  . Nitroglycerin Anxiety  .  Dilaudid [Hydromorphone Hcl] Other (See Comments)    Hallucination  . Levaquin [Levofloxacin In D5w] Nausea And Vomiting  . Sulfa Antibiotics Rash    Social History:   reports that she has never smoked. She has never used smokeless tobacco. She reports that she does not drink alcohol or use illicit drugs.  Family History: No family history on file.   Physical Exam: Filed Vitals:   06/01/13 1524 06/01/13 1719 06/01/13 2201  BP: 151/57 168/59 159/55  Pulse: 71 84 74  Temp: 98 F (36.7 C)  98.3 F (36.8 C)  TempSrc: Oral  Oral  Resp: 18 19 16   Height: 5\' 6"  (1.676 m)    Weight: 196.067 kg (432 lb 4 oz)  193.5 kg (426 lb 9.4 oz)  SpO2: 97% 98% 98%   Blood pressure 159/55, pulse 74, temperature 98.3 F (36.8 C), temperature source Oral, resp. rate 16, height 5\' 6"  (1.676 m), weight 193.5 kg (426 lb 9.4 oz), last menstrual period 05/11/2013, SpO2 98.00%. Body mass index is 68.89 kg/(m^2).   GEN:  Pleasant morbidly obese Caucasian lady lying in bed i; cooperative with exam PSYCH:  alert and oriented x4;   affect is appropriate. HEENT: Mucous membranes pink and anicteric; PERRLA; EOM intact; no cervical lymphadenopathy nor thyromegaly or carotid bruit; no JVD; Breasts:: Not examined CHEST WALL: No tenderness CHEST: Normal respiration, clear to auscultation bilaterally HEART: Regular rate and rhythm; no murmurs rubs or gallops ABDOMEN: Massively Obese, soft non-tender; unable to assess for masses or normal abdominal bowel sounds;Rectal Exam: Not done EXTREMITIES: ; no edema; soccer ball size lipoma behind left knee; skin over the lipoma is indurated and mildly erythematous;approx  6x 4 in stage II superficial ulceration due to denuding in the skin over lipoma Genitalia: not examined PULSES: 2+ and symmetric SKIN: Normal hydration no other rash or ulceration CNS: Cranial nerves 2-12 grossly intact no focal lateralizing neurologic deficit   Labs on Admission:  Basic Metabolic  Panel:  Recent Labs Lab 06/01/13 1715  NA 134*  K 3.9  CL 100  CO2 26  GLUCOSE 135*  BUN 16  CREATININE 0.80  CALCIUM 9.9   Liver Function Tests:  Recent Labs Lab 06/01/13 1715  AST 21  ALT 19  ALKPHOS 74  BILITOT 0.3  PROT 7.2  ALBUMIN 3.5   No results found for this basename: LIPASE, AMYLASE,  in the last 168 hours No results found for this basename: AMMONIA,  in the last 168 hours CBC:  Recent Labs Lab 06/01/13 1715  WBC 7.8  NEUTROABS 5.5  HGB 11.6*  HCT 35.3*  MCV 86.5  PLT 301   Cardiac Enzymes: No results found for this  basename: CKTOTAL, CKMB, CKMBINDEX, TROPONINI,  in the last 168 hours BNP: No components found with this basename: POCBNP,  D-dimer: No components found with this basename: D-DIMER,  CBG: No results found for this basename: GLUCAP,  in the last 168 hours  Radiological Exams on Admission: Ct Femur Right W Contrast  06/01/2013   CLINICAL DATA:  Large area swelling and drainage, long-standing but began draining a few days ago, cellulitis, history hypertension, diabetes, morbid obesity  EXAM: CT OF THE RIGHT LOWER EXTREMITY WITH CONTRAST  TECHNIQUE: Multidetector helical CT imaging of the lower extremities was performed following IV contrast. Sagittal coronal images reconstructed from axial data set.  CONTRAST:  OMNIPAQUE IOHEXOL 300 MG/ML  SOLN  COMPARISON:  None  FINDINGS: Marked area of subcutaneous soft tissue swelling and edema is identified at the medial aspect of the mid to distal right thigh extending to the level of the knee.  Area of edema appears extra fascial with fat and muscular planes within the muscular fascia appearing patent and clear.  Marked dependent skin thickening up to 3.2 cm thick.  No discrete abscess collection is identified though small collections cannot be entirely excluded.  Vascular structures appear grossly patent.  Degenerative changes present at the right knee.  No soft tissue gas or radiopaque foreign body  identified.  IMPRESSION: Area of marked soft tissue swelling and subcutaneous edema at the medial and posterior aspects of the mid to distal right thigh with associated marked skin thickening consistent with cellulitis.  No definite discrete abscess collection is identified though small infected collections are not entirely excluded within the large areas of the observed edema.  Observed infiltrative findings appear extra fascial and no definite intramuscular or intrafascial soft tissue abnormality is identified.   Electronically Signed   By: Ulyses Southward M.D.   On: 06/01/2013 20:09     Assessment/Plan   Active Problems:   Morbid obesity   OSA (obstructive sleep apnea)   Diabetes mellitus type II, controlled  cellulitis left lower extremity Stage II ulceration left lower extremity Left leg lipoma  PLAN: Admit this lady for intravenous antibiotic therapy Dressing of wounds Would benefit from emergent plastic surgery remove this lipoma Advised on dietary measures for weight loss Complement her on her ongoing weight loss May benefit from some home health assistance for wound management  Other plans as per orders.  Code Status: Full code Family Communication:    Terrick Allred Nocturnist [facility] Pager [phone]   06/01/2013, 10:15 PM

## 2013-06-01 NOTE — ED Notes (Signed)
Pt refusing saline lock at this time.  Says will allow it if needed later.  Requesting something to eat and drink, will notify EDP when she is available.

## 2013-06-02 DIAGNOSIS — D649 Anemia, unspecified: Secondary | ICD-10-CM

## 2013-06-02 LAB — GLUCOSE, CAPILLARY
Glucose-Capillary: 116 mg/dL — ABNORMAL HIGH (ref 70–99)
Glucose-Capillary: 131 mg/dL — ABNORMAL HIGH (ref 70–99)

## 2013-06-02 LAB — CBC
HCT: 34.7 % — ABNORMAL LOW (ref 36.0–46.0)
Hemoglobin: 11.2 g/dL — ABNORMAL LOW (ref 12.0–15.0)
MCH: 28.3 pg (ref 26.0–34.0)
MCHC: 32.3 g/dL (ref 30.0–36.0)

## 2013-06-02 LAB — URINALYSIS, ROUTINE W REFLEX MICROSCOPIC
Bilirubin Urine: NEGATIVE
Hgb urine dipstick: NEGATIVE
Ketones, ur: NEGATIVE mg/dL
Protein, ur: NEGATIVE mg/dL
Urobilinogen, UA: 0.2 mg/dL (ref 0.0–1.0)

## 2013-06-02 LAB — BASIC METABOLIC PANEL
BUN: 13 mg/dL (ref 6–23)
CO2: 26 mEq/L (ref 19–32)
Calcium: 9.7 mg/dL (ref 8.4–10.5)
Creatinine, Ser: 0.71 mg/dL (ref 0.50–1.10)
GFR calc non Af Amer: >90 mL/min (ref >90)
Glucose, Bld: 134 mg/dL — ABNORMAL HIGH (ref 70–99)
Potassium: 3.7 mEq/L (ref 3.5–5.1)

## 2013-06-02 LAB — HEMOGLOBIN A1C
Hgb A1c MFr Bld: 6.1 % — ABNORMAL HIGH (ref <5.7)
Mean Plasma Glucose: 128 mg/dL — ABNORMAL HIGH (ref <117)

## 2013-06-02 MED ORDER — HYDROCHLOROTHIAZIDE 25 MG PO TABS
25.0000 mg | ORAL_TABLET | Freq: Every day | ORAL | Status: DC
  Administered 2013-06-02: 25 mg via ORAL
  Filled 2013-06-02: qty 1

## 2013-06-02 MED ORDER — DOXYCYCLINE HYCLATE 100 MG PO TABS
100.0000 mg | ORAL_TABLET | Freq: Two times a day (BID) | ORAL | Status: DC
  Administered 2013-06-02 – 2013-06-03 (×3): 100 mg via ORAL
  Filled 2013-06-02 (×3): qty 1

## 2013-06-02 NOTE — Progress Notes (Signed)
[facility] PROGRESS NOTE  Stacey [name]:[id number] DOB: [birth year] DOA: 06/01/2013 PCP: No PCP Per Patient  Assessment/Plan: 1. Cellulitis 1. Wound culture pending 2. Currently afebrile with no leukocytosis 3. Cont on vanc and zosyn emperically 2. DM 1. Blood sugars thus far stable 2. Cont current regimen for now 3. Stage 2 LLE ulcer 1. Consider wound consult 4. Lipoma 1. Observe for now 5. Obesity 1. Stable 6. DVT prophylaxis 1. Lovenox  Code Status: Full Family Communication: Pt in room (indicate person spoken with, relationship, and if by phone, the number) Disposition Plan: Pending  Antibiotics:  Vancomycin 06/01/13 >>>06/02/13  Zosyn 06/01/13 >>>06/02/13  Doxycycline 06/02/13  HPI/Subjective: No complaints. Eager to go home.  Objective: Filed Vitals:   06/01/13 1524 06/01/13 1719 06/01/13 2201 06/02/13 0500  BP: 151/57 168/59 159/55 157/75  Pulse: 71 84 74 61  Temp: 98 F (36.7 C)  98.3 F (36.8 C) 98 F (36.7 C)  TempSrc: Oral  Oral Oral  Resp: 18 19 16 17   Height: 5\' 6"  (1.676 m)     Weight: 196.067 kg (432 lb 4 oz)  193.5 kg (426 lb 9.4 oz)   SpO2: 97% 98% 98% 99%    Intake/Output Summary (Last 24 hours) at 06/02/13 0816 Last data filed at 06/02/13 0600  Gross per 24 hour  Intake    500 ml  Output    100 ml  Net    400 ml   Filed Weights   06/01/13 1524 06/01/13 2201  Weight: 196.067 kg (432 lb 4 oz) 193.5 kg (426 lb 9.4 oz)    Exam:   General:  Awake, in nad  Cardiovascular: regular, s1, s2  Respiratory: normal resp effort, no wheezing  Abdomen: soft, obese, nondistended  Musculoskeletal: perfused, patch of erythema over R inner thigh with ulcer posteriorly   Data Reviewed: Basic Metabolic Panel:  Recent Labs Lab 06/01/13 1715 06/02/13 0502  NA 134* 136  [name] 3.9 3.7  CL 100 100  CO2 26 26  GLUCOSE 135* 134*  BUN 16 13  CREATININE 0.80 0.71  CALCIUM 9.9 9.7   Liver Function Tests:  Recent Labs Lab  06/01/13 1715  AST 21  ALT 19  ALKPHOS 74  BILITOT 0.3  PROT 7.2  ALBUMIN 3.5   No results found for this basename: LIPASE, AMYLASE,  in the last 168 hours No results found for this basename: AMMONIA,  in the last 168 hours CBC:  Recent Labs Lab 06/01/13 1715 06/02/13 0502  WBC 7.8 9.6  NEUTROABS 5.5  --   HGB 11.6* 11.2*  HCT 35.3* 34.7*  MCV 86.5 87.6  PLT 301 307   Cardiac Enzymes: No results found for this basename: CKTOTAL, CKMB, CKMBINDEX, TROPONINI,  in the last 168 hours BNP (last 3 results) No results found for this basename: PROBNP,  in the last 8760 hours CBG:  Recent Labs Lab 06/01/13 2159 06/02/13 0756  GLUCAP 148* 116*    No results found for this or any previous visit (from the past 240 hour(s)).   Studies: Ct Femur Right W Contrast  06/01/2013   CLINICAL DATA:  Large area swelling and drainage, long-standing but began draining a few days ago, cellulitis, history hypertension, diabetes, morbid obesity  EXAM: CT OF THE RIGHT LOWER EXTREMITY WITH CONTRAST  TECHNIQUE: Multidetector helical CT imaging of the lower extremities was performed following IV contrast. Sagittal coronal images reconstructed from axial data set.  CONTRAST:  OMNIPAQUE IOHEXOL 300 MG/ML  SOLN  COMPARISON:  None  FINDINGS: Marked area of subcutaneous soft tissue swelling and edema is identified at the medial aspect of the mid to distal right thigh extending to the level of the knee.  Area of edema appears extra fascial with fat and muscular planes within the muscular fascia appearing patent and clear.  Marked dependent skin thickening up to 3.2 cm thick.  No discrete abscess collection is identified though small collections cannot be entirely excluded.  Vascular structures appear grossly patent.  Degenerative changes present at the right knee.  No soft tissue gas or radiopaque foreign body identified.  IMPRESSION: Area of marked soft tissue swelling and subcutaneous edema at the medial  and posterior aspects of the mid to distal right thigh with associated marked skin thickening consistent with cellulitis.  No definite discrete abscess collection is identified though small infected collections are not entirely excluded within the large areas of the observed edema.  Observed infiltrative findings appear extra fascial and no definite intramuscular or intrafascial soft tissue abnormality is identified.   Electronically Signed   By: Ulyses Southward M.D.   On: 06/01/2013 20:09    Scheduled Meds: . acidophilus  2 capsule Oral Daily  . aspirin EC  81 mg Oral Daily  . buPROPion  300 mg Oral Daily  . busPIRone  15 mg Oral TID  . enoxaparin (LOVENOX) injection  80 mg Subcutaneous Q24H  . escitalopram  20 mg Oral Daily  . fluconazole  150 mg Oral Daily  . hydrochlorothiazide  25 mg Oral Daily  . insulin aspart  0-5 Units Subcutaneous QHS  . insulin aspart  0-9 Units Subcutaneous TID WC  . nystatin   Topical BID  . piperacillin-tazobactam (ZOSYN)  IV  3.375 g Intravenous Q8H  . vancomycin  1,500 mg Intravenous Q12H   Continuous Infusions:   Active Problems:   Morbid obesity   OSA (obstructive sleep apnea)   Diabetes mellitus type II, controlled  Time spent:  Stacey [name]  [facility] Pager [phone]. If 7PM-7AM, please contact night-coverage at [email or web address], password [email or web address] 06/02/2013, 8:16 AM  LOS: 1 day

## 2013-06-02 NOTE — Consult Note (Signed)
WOC wound consult note Reason for Consult:Full thickness tissue loss due to friction as patient ambulates: medial thigh areas of adipose tissue rub together. Wound type:CUll thickness, friction Pressure Ulcer POA: No Measurement:5cm x 5cm x 0.2cm. Wound JXB:JYNW pink, moist, granulating Drainage (amount, consistency, odor) scant amoutn serous exudate on old dressing Periwound:intact, erythematous Dressing procedure/placement/frequency: I believe the POC implemented by the bedside RN is appropriate.  A soft silicone foam dressing will support moist wound healing and decrease friction when patient's areas of adipose tissue rub together when ambulating.  Patient is unable to apply these dressings, even with a hand-held mirror and she lives many miles from her sister.  I consequently recommend a referral for a  HHRN twice weekly for a few weeks to place these dressings and to monitor this wound for progression toward wound healing. If you agree, please order. WOC nursing team will not follow, but will remain available to this patient, the nursing and medical team.  Please re-consult if needed. Thanks, Ladona Mow, MSN, RN, GNP, [place], [facility] [phone])

## 2013-06-03 DIAGNOSIS — E119 Type 2 diabetes mellitus without complications: Secondary | ICD-10-CM

## 2013-06-03 DIAGNOSIS — F329 Major depressive disorder, single episode, unspecified: Secondary | ICD-10-CM

## 2013-06-03 DIAGNOSIS — L03115 Cellulitis of right lower limb: Secondary | ICD-10-CM

## 2013-06-03 LAB — CBC
Hemoglobin: 11.5 g/dL — ABNORMAL LOW (ref 12.0–15.0)
MCH: 28 pg (ref 26.0–34.0)
MCHC: 32 g/dL (ref 30.0–36.0)
Platelets: 300 10*3/uL (ref 150–400)
RDW: 12.9 % (ref 11.5–15.5)
WBC: 7.7 10*3/uL (ref 4.0–10.5)

## 2013-06-03 LAB — BASIC METABOLIC PANEL
Calcium: 9.8 mg/dL (ref 8.4–10.5)
Chloride: 103 mEq/L (ref 96–112)
GFR calc Af Amer: >90 mL/min (ref >90)
GFR calc non Af Amer: >90 mL/min (ref >90)
Glucose, Bld: 116 mg/dL — ABNORMAL HIGH (ref 70–99)
Potassium: 3.9 mEq/L (ref 3.5–5.1)
Sodium: 140 mEq/L (ref 135–145)

## 2013-06-03 LAB — GLUCOSE, CAPILLARY

## 2013-06-03 MED ORDER — OXYCODONE HCL 5 MG PO TABS: 5.0000 mg | ORAL_TABLET | ORAL | Status: DC | PRN

## 2013-06-03 MED ORDER — DOXYCYCLINE HYCLATE 100 MG PO TABS: 100.0000 mg | ORAL_TABLET | Freq: Two times a day (BID) | ORAL | Status: DC

## 2013-06-03 MED ORDER — TRAMADOL HCL 50 MG PO TABS: 50.0000 mg | ORAL_TABLET | Freq: Four times a day (QID) | ORAL | Status: DC | PRN

## 2013-06-03 MED ORDER — RISAQUAD PO CAPS: 2.0000 | ORAL_CAPSULE | Freq: Every day | ORAL | Status: DC

## 2013-06-03 NOTE — Progress Notes (Signed)
Pt discharged with instructions, prescriptions, and care notes.  The patient verbalized understanding.  The patient left the floor w/c with staff in stable condition.  Dressing changed prior to discharge.

## 2013-06-03 NOTE — Discharge Summary (Signed)
Physician Discharge Summary  Stacey [name]:[id number] DOB: [birth year] DOA: 06/01/2013  PCP: No PCP Per Patient  Admit date: 06/01/2013 Discharge date: 06/03/2013  Time spent: 35 minutes  Recommendations for Outpatient Follow-up:  1. Follow up with PCP in 1-2 weeks 2. WOC recommendations for a soft silicone foam dressing to support moist wound healing with a referral for HHRN twice weekly for a few weeks to place these dressings and to monitor wound for progression  Discharge Diagnoses:  Principal Problem:   Cellulitis of leg, right Active Problems:   Morbid obesity   OSA (obstructive sleep apnea)   Diabetes mellitus type II, controlled   Discharge Condition: Stable  Diet recommendation: Diabetic  Filed Weights   06/01/13 1524 06/01/13 2201 06/03/13 0500  Weight: 196.067 kg (432 lb 4 oz) 193.5 kg (426 lb 9.4 oz) 147.238 kg (324 lb 9.6 oz)    History of present illness:  Stacey [name] is a 52 y.o. female. 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 Because of her marked obesity it is difficult for her to attend to this local ulceration herself, and she is usually assisted by her sister. Today her sister told her that the area is looking worse than usual and is having a foul smell and she was advised to come to the emergency room for evaluation.   In the emergency room she had a CT scan of the thigh which suggested cellulitis and the hospitalist service was called for admission. She has had no fever or chills.   She is very distressed because she is unable to get surgery to remove the thigh mass   She was lost about 15 pounds over the  past few months  Hospital Course:  Cellulitis  1. Pt has remained afebrile with no leukocytosis 2. Was continued on vanc and zosyn emperically 3. Antibiotics were transitioned to PO doxycycline as of 06/02/13 DM  1. Blood sugars thus far stable Stage 2 LLE ulcer  1. Wound care consulted 2. Recommendations for a soft silicone foam dressing to support moist wound healing with a referral for [facility] twice weekly for a few weeks to place these dressings and to monitor wound for progression Lipoma  1. Stable Obesity  1. Stable DVT prophylaxis  1. Lovenox subQ  Consultations:  Wound Ostomy Service  Discharge Exam: Filed Vitals:   06/02/13 2343 06/03/13 0202 06/03/13 0500 06/03/13 0540  BP:  165/67  164/54  Pulse: 56 57  63  Temp:  98 F (36.7 C)  98.4 F (36.9 C)  TempSrc:  Axillary  Axillary  Resp: 16 16  16   Height:      Weight:   147.238 kg (324 lb 9.6 oz)   SpO2: 95% 99%  100%    General: Awake, in nad Cardiovascular: regular, s1, s2 Respiratory: normal resp effort, no wheezing  Discharge Instructions     Medication List         acidophilus Caps capsule  Take 2 capsules by mouth daily.     albuterol 108 (90 BASE) MCG/ACT inhaler  Commonly known as:  PROVENTIL HFA;VENTOLIN HFA  Inhale 2 puffs into the lungs  every 4 (four) hours as needed for wheezing or shortness of breath (or coughing).     buPROPion 300 MG 24 hr tablet  Commonly known as:  WELLBUTRIN XL  Take 300 mg by mouth daily.     busPIRone 15 MG tablet  Commonly known as:  BUSPAR  Take 15 mg by mouth 3 (three) times daily.     clotrimazole-betamethasone cream  Commonly known as:  LOTRISONE  Apply 1 application topically 2 (two) times daily.     doxycycline 100 MG tablet  Commonly known as:  VIBRA-TABS  Take 1 tablet (100 mg total) by mouth every 12 (twelve) hours.     escitalopram 20 MG tablet  Commonly known as:  LEXAPRO  Take 20 mg by mouth daily.     hydrochlorothiazide 25 MG tablet   Commonly known as:  HYDRODIURIL  Take 25 mg by mouth daily.     ibuprofen 200 MG tablet  Commonly known as:  ADVIL,MOTRIN  Take 800 mg by mouth every 6 (six) hours as needed for pain.     loperamide 2 MG capsule  Commonly known as:  IMODIUM  Take 2 mg by mouth daily.     LORazepam 1 MG tablet  Commonly known as:  ATIVAN  Take 1 mg by mouth 4 (four) times daily as needed for anxiety.     traMADol 50 MG tablet  Commonly known as:  ULTRAM  Take 1 tablet (50 mg total) by mouth every 6 (six) hours as needed for pain.       Allergies  Allergen Reactions  . Nitroglycerin Anxiety  . Dilaudid [Hydromorphone Hcl] Other (See Comments)    Hallucination  . Levaquin [Levofloxacin In D5w] Nausea And Vomiting  . Sulfa Antibiotics Rash   Follow-up Information   Follow up with [facility] On 06/13/2013. (Appt at 10:15, Bring Medicaid Card)    Contact information:   [phone]      Follow up with [facility]. (Wound Care, [facility] RN)    Contact information:   [address] [place] [address] [phone]       The results of significant diagnostics from this hospitalization (including imaging, microbiology, ancillary and laboratory) are listed below for reference.    Significant Diagnostic Studies: Ct Femur Right W Contrast  06/01/2013   CLINICAL DATA:  Large area swelling and drainage, long-standing but began draining a few days ago, cellulitis, history hypertension, diabetes, morbid obesity  EXAM: CT OF THE RIGHT LOWER EXTREMITY WITH CONTRAST  TECHNIQUE: Multidetector helical CT imaging of the lower extremities was performed following IV contrast. Sagittal coronal images reconstructed from axial data set.  CONTRAST:  OMNIPAQUE IOHEXOL 300 MG/ML  SOLN  COMPARISON:  None  FINDINGS: Marked area of subcutaneous soft tissue swelling and edema is identified at the medial aspect of the mid to distal right thigh extending to the level of the knee.  Area of edema  appears extra fascial with fat and muscular planes within the muscular fascia appearing patent and clear.  Marked dependent skin thickening up to 3.2 cm thick.  No discrete abscess collection is identified though small collections cannot be entirely excluded.  Vascular structures appear grossly patent.  Degenerative changes present at the right knee.  No soft tissue gas or radiopaque foreign body identified.  IMPRESSION: Area of marked soft tissue swelling and subcutaneous edema at the medial and posterior aspects of the mid to distal right thigh with associated marked skin thickening consistent with cellulitis.  No definite discrete abscess collection is identified though small infected collections are not entirely excluded within the large areas of the observed edema.  Observed infiltrative findings appear extra fascial and no definite intramuscular or intrafascial soft tissue abnormality is identified.   Electronically Signed   By: Ulyses Southward M.D.   On: 06/01/2013 20:09    Microbiology: Recent Results (from the past 240 hour(s))  WOUND CULTURE     Status: None   Collection Time    06/02/13  5:21 AM      Result Value Range Status   Specimen Description LEG   Final   Special Requests Normal   Final   Gram Stain     Final   Value: NO WBC SEEN     NO SQUAMOUS EPITHELIAL CELLS SEEN     RARE GRAM POSITIVE COCCI     IN PAIRS IN CLUSTERS     Performed at [facility]   Culture     Final   Value: Culture reincubated for better growth     Performed at [facility]   Report Status PENDING   Incomplete     Labs: Basic Metabolic Panel:  Recent Labs Lab 06/01/13 1715 06/02/13 0502 06/03/13 0532  NA 134* 136 140  K 3.9 3.7 3.9  CL 100 100 103  CO2 26 26 25   GLUCOSE 135* 134* 116*  BUN 16 13 13   CREATININE 0.80 0.71 0.72  CALCIUM 9.9 9.7 9.8   Liver Function Tests:  Recent Labs Lab 06/01/13 1715  AST 21  ALT 19  ALKPHOS 74  BILITOT 0.3  PROT 7.2  ALBUMIN 3.5    No results found for this basename: LIPASE, AMYLASE,  in the last 168 hours No results found for this basename: AMMONIA,  in the last 168 hours CBC:  Recent Labs Lab 06/01/13 1715 06/02/13 0502 06/03/13 0532  WBC 7.8 9.6 7.7  NEUTROABS 5.5  --   --   HGB 11.6* 11.2* 11.5*  HCT 35.3* 34.7* 35.9*  MCV 86.5 87.6 87.3  PLT 301 307 300   Cardiac Enzymes: No results found for this basename: CKTOTAL, CKMB, CKMBINDEX, TROPONINI,  in the last 168 hours BNP: BNP (last 3 results) No results found for this basename: PROBNP,  in the last 8760 hours CBG:  Recent Labs Lab 06/01/13 2159 06/02/13 0756 06/02/13 1111 06/02/13 1630 06/02/13 2210  GLUCAP 148* 116* 130* 131* 153*    Signed:  Starling Jessie K  [facility] 06/03/2013, 12:07 PM

## 2013-06-03 NOTE — Care Management Note (Signed)
    Page 1 of 1   06/03/2013     11:52:30 AM   CARE MANAGEMENT NOTE 06/03/2013  Patient:  Stacey [name], Stacey [name]   Account Number:  [id number]  Date Initiated:  06/02/2013  Documentation initiated by:  Lanier Clam  Subjective/Objective Assessment:   52 y/o f admitted w/r le cellulitis.     Action/Plan:   From home.   Anticipated DC Date:  06/04/2013   Anticipated DC Plan:  HOME/SELF CARE         Choice offered to / List presented to:          [facility] arranged  HH-1 RN  HH-10 DISEASE MANAGEMENT      HH agency  [facility]   Status of service:  Completed, signed off Medicare Important Message given?   (If response is "NO", the following Medicare IM given date fields will be blank) Date Medicare IM given:   Date Additional Medicare IM given:    Discharge Disposition:  HOME W HOME HEALTH SERVICES  Per UR Regulation:  Reviewed for med. necessity/level of care/duration of stay  If discussed at Long Length of Stay Meetings, dates discussed:    Comments:  06/03/13 Rosemary Holms RN BSN CM Pt's Medicaid card denotes [facility] as PCP. Appt set up and put on AVM. Pt also selects [facility] for [facility] RN. Pt's telephone # is [phone]. Pt c/o that her wheelchair is broken. WC came from [facility]. CM called [facility] and they have ordered parts. Pt notified of PCP appt, [facility] to call and parts ordered for [facility]

## 2013-06-04 LAB — GLUCOSE, CAPILLARY: Glucose-Capillary: 113 mg/dL — ABNORMAL HIGH (ref 70–99)

## 2013-06-05 LAB — WOUND CULTURE: Gram Stain: NONE SEEN

## 2013-06-05 MED ORDER — HYDROCODONE-ACETAMINOPHEN 5-325 MG PO TABS: 1.0000 | ORAL_TABLET | Freq: Four times a day (QID) | ORAL | Status: DC | PRN

## 2013-07-12 ENCOUNTER — Emergency Department: Payer: Self-pay | Admitting: Emergency Medicine

## 2013-07-12 LAB — CBC
HGB: 12.1 g/dL (ref 12.0–16.0)
MCH: 27.7 pg (ref 26.0–34.0)
MCHC: 32.5 g/dL (ref 32.0–36.0)
MCV: 85 fL (ref 80–100)
Platelet: 271 10*3/uL (ref 150–440)
RDW: 13.6 % (ref 11.5–14.5)
WBC: 9.3 10*3/uL (ref 3.6–11.0)

## 2013-07-12 LAB — URINALYSIS, COMPLETE
Bilirubin,UR: NEGATIVE
Blood: NEGATIVE
Glucose,UR: NEGATIVE mg/dL (ref 0–75)
Ph: 6 (ref 4.5–8.0)
Protein: NEGATIVE
RBC,UR: NONE SEEN /HPF (ref 0–5)
Squamous Epithelial: 2

## 2013-07-12 LAB — COMPREHENSIVE METABOLIC PANEL
Albumin: 3.5 g/dL (ref 3.4–5.0)
Alkaline Phosphatase: 78 U/L
Anion Gap: 8 (ref 7–16)
BUN: 16 mg/dL (ref 7–18)
Calcium, Total: 9.6 mg/dL (ref 8.5–10.1)
Chloride: 105 mmol/L (ref 98–107)
Creatinine: 0.87 mg/dL (ref 0.60–1.30)
EGFR (African American): >60
EGFR (Non-African Amer.): >60
Glucose: 105 mg/dL — ABNORMAL HIGH (ref 65–99)
Osmolality: 275 (ref 275–301)
Potassium: 4.2 mmol/L (ref 3.5–5.1)
SGOT(AST): 26 U/L (ref 15–37)
SGPT (ALT): 27 U/L (ref 12–78)
Sodium: 137 mmol/L (ref 136–145)
Total Protein: 7.4 g/dL (ref 6.4–8.2)

## 2013-07-15 ENCOUNTER — Emergency Department (HOSPITAL_COMMUNITY): Payer: Medicaid Other

## 2013-07-15 ENCOUNTER — Encounter (HOSPITAL_COMMUNITY): Payer: Self-pay | Admitting: Emergency Medicine

## 2013-07-15 ENCOUNTER — Observation Stay (HOSPITAL_COMMUNITY)
Admission: EM | Admit: 2013-07-15 | Discharge: 2013-07-18 | Disposition: A | Discharge status: Home / Self Care | Payer: Medicaid Other | Attending: Internal Medicine | Admitting: Internal Medicine

## 2013-07-15 ENCOUNTER — Emergency Department (HOSPITAL_COMMUNITY)
Admission: EM | Admit: 2013-07-15 | Discharge: 2013-07-15 | Discharge status: Against Medical Advice | Payer: Medicaid Other | Source: Home / Self Care

## 2013-07-15 DIAGNOSIS — R1031 Right lower quadrant pain: Secondary | ICD-10-CM

## 2013-07-15 DIAGNOSIS — D649 Anemia, unspecified: Secondary | ICD-10-CM

## 2013-07-15 DIAGNOSIS — A0472 Enterocolitis due to Clostridium difficile, not specified as recurrent: Secondary | ICD-10-CM

## 2013-07-15 DIAGNOSIS — R0602 Shortness of breath: Secondary | ICD-10-CM | POA: Diagnosis present

## 2013-07-15 DIAGNOSIS — F411 Generalized anxiety disorder: Secondary | ICD-10-CM | POA: Insufficient documentation

## 2013-07-15 DIAGNOSIS — D1723 Benign lipomatous neoplasm of skin and subcutaneous tissue of right leg: Secondary | ICD-10-CM

## 2013-07-15 DIAGNOSIS — E119 Type 2 diabetes mellitus without complications: Secondary | ICD-10-CM | POA: Insufficient documentation

## 2013-07-15 DIAGNOSIS — Z86718 Personal history of other venous thrombosis and embolism: Secondary | ICD-10-CM | POA: Insufficient documentation

## 2013-07-15 DIAGNOSIS — M7918 Myalgia, other site: Secondary | ICD-10-CM

## 2013-07-15 DIAGNOSIS — F329 Major depressive disorder, single episode, unspecified: Secondary | ICD-10-CM | POA: Insufficient documentation

## 2013-07-15 DIAGNOSIS — R1032 Left lower quadrant pain: Secondary | ICD-10-CM

## 2013-07-15 DIAGNOSIS — L03115 Cellulitis of right lower limb: Secondary | ICD-10-CM | POA: Diagnosis present

## 2013-07-15 DIAGNOSIS — N390 Urinary tract infection, site not specified: Secondary | ICD-10-CM

## 2013-07-15 DIAGNOSIS — Z8619 Personal history of other infectious and parasitic diseases: Secondary | ICD-10-CM | POA: Insufficient documentation

## 2013-07-15 DIAGNOSIS — I2699 Other pulmonary embolism without acute cor pulmonale: Principal | ICD-10-CM | POA: Diagnosis present

## 2013-07-15 DIAGNOSIS — Z6841 Body Mass Index (BMI) 40.0 and over, adult: Secondary | ICD-10-CM | POA: Insufficient documentation

## 2013-07-15 DIAGNOSIS — I10 Essential (primary) hypertension: Secondary | ICD-10-CM | POA: Diagnosis present

## 2013-07-15 DIAGNOSIS — R001 Bradycardia, unspecified: Secondary | ICD-10-CM

## 2013-07-15 DIAGNOSIS — D72829 Elevated white blood cell count, unspecified: Secondary | ICD-10-CM

## 2013-07-15 DIAGNOSIS — F32A Depression, unspecified: Secondary | ICD-10-CM

## 2013-07-15 DIAGNOSIS — R0789 Other chest pain: Secondary | ICD-10-CM

## 2013-07-15 DIAGNOSIS — I82409 Acute embolism and thrombosis of unspecified deep veins of unspecified lower extremity: Secondary | ICD-10-CM | POA: Insufficient documentation

## 2013-07-15 DIAGNOSIS — K529 Noninfective gastroenteritis and colitis, unspecified: Secondary | ICD-10-CM

## 2013-07-15 DIAGNOSIS — I493 Ventricular premature depolarization: Secondary | ICD-10-CM

## 2013-07-15 DIAGNOSIS — L02419 Cutaneous abscess of limb, unspecified: Secondary | ICD-10-CM | POA: Insufficient documentation

## 2013-07-15 DIAGNOSIS — Z7901 Long term (current) use of anticoagulants: Secondary | ICD-10-CM | POA: Insufficient documentation

## 2013-07-15 DIAGNOSIS — J45909 Unspecified asthma, uncomplicated: Secondary | ICD-10-CM | POA: Insufficient documentation

## 2013-07-15 DIAGNOSIS — F3289 Other specified depressive episodes: Secondary | ICD-10-CM | POA: Insufficient documentation

## 2013-07-15 DIAGNOSIS — G4733 Obstructive sleep apnea (adult) (pediatric): Secondary | ICD-10-CM | POA: Diagnosis present

## 2013-07-15 DIAGNOSIS — M792 Neuralgia and neuritis, unspecified: Secondary | ICD-10-CM

## 2013-07-15 DIAGNOSIS — R0600 Dyspnea, unspecified: Secondary | ICD-10-CM

## 2013-07-15 DIAGNOSIS — E876 Hypokalemia: Secondary | ICD-10-CM

## 2013-07-15 DIAGNOSIS — R002 Palpitations: Secondary | ICD-10-CM

## 2013-07-15 DIAGNOSIS — J45901 Unspecified asthma with (acute) exacerbation: Secondary | ICD-10-CM | POA: Insufficient documentation

## 2013-07-15 HISTORY — DX: Acute embolism and thrombosis of unspecified deep veins of unspecified lower extremity: I82.409

## 2013-07-15 LAB — CBC
MCH: 29.2 pg (ref 26.0–34.0)
MCHC: 34 g/dL (ref 30.0–36.0)
MCV: 85.7 fL (ref 78.0–100.0)
Platelets: 304 10*3/uL (ref 150–400)
WBC: 9.5 10*3/uL (ref 4.0–10.5)

## 2013-07-15 LAB — BASIC METABOLIC PANEL
CO2: 22 mEq/L (ref 19–32)
Calcium: 10.3 mg/dL (ref 8.4–10.5)
Creatinine, Ser: 0.77 mg/dL (ref 0.50–1.10)
GFR calc non Af Amer: >90 mL/min (ref >90)
Glucose, Bld: 99 mg/dL (ref 70–99)
Sodium: 138 mEq/L (ref 135–145)

## 2013-07-15 LAB — LACTIC ACID, PLASMA: Lactic Acid, Venous: 1.8 mmol/L (ref 0.5–2.2)

## 2013-07-15 LAB — PRO B NATRIURETIC PEPTIDE: Pro B Natriuretic peptide (BNP): 49.5 pg/mL (ref 0–125)

## 2013-07-15 MED ORDER — ONDANSETRON HCL 4 MG/2ML IJ SOLN
4.0000 mg | Freq: Once | INTRAMUSCULAR | Status: AC
  Administered 2013-07-15: 4 mg via INTRAVENOUS
  Filled 2013-07-15: qty 2

## 2013-07-15 MED ORDER — IOHEXOL 350 MG/ML SOLN: 100.0000 mL | Freq: Once | INTRAVENOUS | Status: AC | PRN

## 2013-07-15 MED ORDER — FENTANYL CITRATE 0.05 MG/ML IJ SOLN
100.0000 ug | Freq: Once | INTRAMUSCULAR | Status: AC
  Administered 2013-07-15: 100 ug via INTRAVENOUS
  Filled 2013-07-15: qty 2

## 2013-07-15 MED ORDER — RIVAROXABAN 15 MG PO TABS
15.0000 mg | ORAL_TABLET | Freq: Once | ORAL | Status: AC
  Administered 2013-07-16: 15 mg via ORAL
  Filled 2013-07-15: qty 1

## 2013-07-15 MED ORDER — OXYCODONE-ACETAMINOPHEN 5-325 MG PO TABS
2.0000 | ORAL_TABLET | Freq: Once | ORAL | Status: AC
  Administered 2013-07-15: 2 via ORAL
  Filled 2013-07-15: qty 2

## 2013-07-15 NOTE — ED Notes (Signed)
MD at bedside. 

## 2013-07-15 NOTE — ED Provider Notes (Signed)
CSN: [id number]     Arrival date & time 07/15/13  1512 History   First MD Initiated Contact with Patient 07/15/13 2037     Chief Complaint  Patient presents with  . Numbness  . Leg Pain    HPI  Stacey [name] is a 52 y.o. female with a PMH of HTN, depression, type 2 DM, diverticulitis, asthma, leg cyst, pancreatitis, arthritis, c diff, neuropathic and musculoskeletal pain, sleep apnea, benign lipomas of the right leg, and DVT who presents to the ED for evaluation of leg pain and numbness.  History was provided by the patient.  Patient states she was dx with a DVT on Friday in the ED (07/12/13) and started on Xeralto (with no missed doses).  She states that she has right leg and back pain at baseline, however, her pain has worsened after being dx with a DVT a few days ago.  She also has chronic right leg cellulitis and has being treated with antibiotics (doxycycline?).  She also complains of numbness to her right foot, which is "more numb than usual."  No weakness or loss of sensation.  She uses a cane at baseline to help with ambulation.  She denies any trauma/injuries.  She also complains of intermittent chest pain over the past 2-3 days and SOB "worse than usual."  She states she has SOB at baseline with an acute worsening in her condition and she now has SOB with talking.  No SOB at rest.  Her chest pain is located in the mid-sternal region without radiation and is described as a sharp pain.  She has had a non-productive cough for 3-4 days.  No fever, rhinorrhea, congestion, but she has a "stratchy throat."  She denies any abdominal pain, nausea, emesis, diarrhea, or constipation.  She states her head feels "numb" ever since she started [place] and she also complains of a tingling to her lips with this.  No headache, dizziness, lightheadedness, or vision changes.   Past Medical History  Diagnosis Date  . Hypertension   . Depression   . Type 2 diabetes mellitus   . Diverticulitis   . Asthma    . Cyst     pt has "cyst" to legs for years, areas drain at time  . Pancreatitis   . Arthritis   . C. difficile colitis 11/08/2011  . Musculoskeletal pain 11/08/2011  . Neuropathic pain 11/10/2011  . Sleep apnea with use of continuous positive airway pressure (CPAP)     uses CPAP at night  . 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 DVT (deep venous thrombosis)    Past Surgical History  Procedure Laterality Date  . Tonsillectomy    . Cholecystectomy    . Cesarean section    . Nose surgery     No family history on file. History  Substance Use Topics  . Smoking status: Never Smoker   . Smokeless tobacco: Never Used  . Alcohol Use: No   OB History   Grav Para Term Preterm Abortions TAB SAB Ect Mult Living                 Review of Systems  Constitutional: Negative for fever, chills, diaphoresis, activity change, appetite change and fatigue.  HENT: Negative for congestion, rhinorrhea and sore throat.   Eyes: Negative for visual disturbance.  Respiratory: Positive for cough and shortness of breath. Negative for choking, chest tightness and wheezing.   Cardiovascular: Positive for chest pain and leg  swelling.  Gastrointestinal: Negative for nausea, vomiting, abdominal pain, diarrhea and constipation.  Genitourinary: Negative for dysuria.  Musculoskeletal: Positive for arthralgias, back pain (chronic) and myalgias.  Skin: Positive for color change (cellulitis). Negative for wound.  Neurological: Positive for numbness. Negative for dizziness, syncope, weakness, light-headedness and headaches.  Psychiatric/Behavioral: Negative for confusion. The patient is nervous/anxious.     Allergies  Nitroglycerin; Dilaudid; Levaquin; Trazodone and nefazodone; and Sulfa antibiotics  Home Medications   Current Outpatient Rx  Name  Route  Sig  Dispense  Refill  . acidophilus (RISAQUAD) CAPS capsule   Oral   Take 2 capsules by mouth daily.   30 capsule   0   .  albuterol (PROVENTIL HFA;VENTOLIN HFA) 108 (90 BASE) MCG/ACT inhaler   Inhalation   Inhale 2 puffs into the lungs every 4 (four) hours as needed for wheezing or shortness of breath (or coughing).   1 Inhaler   0   . buPROPion (WELLBUTRIN XL) 300 MG 24 hr tablet   Oral   Take 300 mg by mouth daily.          . busPIRone (BUSPAR) 15 MG tablet   Oral   Take 15 mg by mouth 3 (three) times daily.          . clotrimazole-betamethasone (LOTRISONE) cream   Topical   Apply 1 application topically 2 (two) times daily.         Marland Kitchen doxycycline (VIBRA-TABS) 100 MG tablet   Oral   Take 1 tablet (100 mg total) by mouth every 12 (twelve) hours.   28 tablet   0   . escitalopram (LEXAPRO) 20 MG tablet   Oral   Take 20 mg by mouth daily.           . hydrochlorothiazide (HYDRODIURIL) 25 MG tablet   Oral   Take 25 mg by mouth daily.          Marland Kitchen HYDROcodone-acetaminophen (NORCO/VICODIN) 5-325 MG per tablet   Oral   Take 1 tablet by mouth every 6 (six) hours as needed for moderate pain.   30 tablet   0   . ibuprofen (ADVIL,MOTRIN) 200 MG tablet   Oral   Take 800 mg by mouth every 6 (six) hours as needed for pain.         Marland Kitchen loperamide (IMODIUM) 2 MG capsule   Oral   Take 2 mg by mouth daily.          Marland Kitchen LORazepam (ATIVAN) 1 MG tablet   Oral   Take 1 mg by mouth 4 (four) times daily as needed for anxiety.          . traMADol (ULTRAM) 50 MG tablet   Oral   Take 1 tablet (50 mg total) by mouth every 6 (six) hours as needed for pain.   30 tablet   0     Tolerated opiates in hospital    BP 165/58  Pulse 77  Temp(Src) 98 F (36.7 C) (Oral)  Resp 20  Ht 5\' 6"  (1.676 m)  Wt 432 lb 6.4 oz (196.135 kg)  BMI 69.82 kg/m2  SpO2 100%  LMP 06/26/2013  Filed Vitals:   07/16/13 1400 07/16/13 2001 07/17/13 0018 07/17/13 0643  BP: 144/55 136/74  158/76  Pulse: 65 68 68 65  Temp: 98.5 F (36.9 C) 98.7 F (37.1 C)  97.7 F (36.5 C)  TempSrc: Oral Oral  Oral  Resp: 18 18  17 17   Height:  Weight:      SpO2: 100% 97% 97% 98%    Physical Exam  Nursing note and vitals reviewed. Constitutional: She is oriented to person, place, and time. She appears well-developed and well-nourished. No distress.  Obese female  HENT:  Head: Normocephalic and atraumatic.  Right Ear: External ear normal.  Left Ear: External ear normal.  Nose: Nose normal.  Mouth/Throat: Oropharynx is clear and moist. No oropharyngeal exudate.  Eyes: Conjunctivae are normal. Right eye exhibits no discharge. Left eye exhibits no discharge.  Neck: Normal range of motion. Neck supple.  Cardiovascular: Normal rate, regular rhythm and normal heart sounds.  Exam reveals no gallop and no friction rub.   No murmur heard. Pulmonary/Chest: Effort normal and breath sounds normal. No respiratory distress. She has no wheezes. She has no rales. She exhibits no tenderness.  Abdominal: Soft. Bowel sounds are normal. She exhibits no distension and no mass. There is no tenderness. There is no rebound and no guarding.  Musculoskeletal: Normal range of motion. She exhibits edema and tenderness.  Dorsiflexion and plantarflexion intact.  Patient unable to flex or extend hips of knees bilaterally due to pain.  No tenderness to palpation to the hips, knees, and leg throughout.    Neurological: She is alert and oriented to person, place, and time.  Sensation intact in the LE bilaterally  Skin: Skin is warm and dry. No rash noted. She is not diaphoretic. There is erythema. No pallor.  Erythema and 5 cm x 5 cm superficial wound to the left medial thigh.  No drainage or bleeding.      ED Course  Procedures (including critical care time) Labs Review Labs Reviewed  CBC  BASIC METABOLIC PANEL  GLUCOSE, CAPILLARY   Imaging Review Dg Chest 2 View (if Patient Has Fever And/or Copd)  07/15/2013   CLINICAL DATA:  Shortness of breath and cough  EXAM: CHEST  2 VIEW  COMPARISON:  February 17, 2013  FINDINGS: Lungs are  clear. Heart is mildly enlarged with normal pulmonary vascularity. No adenopathy. There is degenerative change in the lower thoracic spine.  IMPRESSION: Heart mildly enlarged.  No edema or consolidation.   Electronically Signed   By: Bretta Bang M.D.   On: 07/15/2013 16:18    EKG Interpretation    Date/Time:  Monday July 15 2013 15:32:38 EST Ventricular Rate:  61 PR Interval:  212 QRS Duration: 94 QT Interval:  414 QTC Calculation: 416 R Axis:   39 Text Interpretation:  Sinus rhythm with 1st degree A-V block Low voltage QRS Cannot rule out Anterior infarct , age undetermined Abnormal ECG ED PHYSICIAN INTERPRETATION AVAILABLE IN [facility] Confirmed by TEST, RECORD ([id number]) on 07/17/2013 9:17:56 AM           Results for orders placed during the hospital encounter of 07/15/13  CBC      Result Value Range   WBC 9.5  4.0 - 10.5 K/uL   RBC 4.56  3.87 - 5.11 MIL/uL   Hemoglobin 13.3  12.0 - 15.0 g/dL   HCT 60.4  54.0 - 98.1 %   MCV 85.7  78.0 - 100.0 fL   MCH 29.2  26.0 - 34.0 pg   MCHC 34.0  30.0 - 36.0 g/dL   RDW 19.1  47.8 - 29.5 %   Platelets 304  150 - 400 K/uL  BASIC METABOLIC PANEL      Result Value Range   Sodium 138  135 - 145 mEq/L   Potassium 3.9  3.5 - 5.1 mEq/L   Chloride 101  96 - 112 mEq/L   CO2 22  19 - 32 mEq/L   Glucose, Bld 99  70 - 99 mg/dL   BUN 13  6 - 23 mg/dL   Creatinine, Ser 4.09  0.50 - 1.10 mg/dL   Calcium 81.1  8.4 - 91.4 mg/dL   GFR calc non Af Amer >90  >90 mL/min   GFR calc Af Amer >90  >90 mL/min  GLUCOSE, CAPILLARY      Result Value Range   Glucose-Capillary 94  70 - 99 mg/dL  LACTIC ACID, PLASMA      Result Value Range   Lactic Acid, Venous 1.8  0.5 - 2.2 mmol/L  PRO B NATRIURETIC PEPTIDE      Result Value Range   Pro B Natriuretic peptide (BNP) 49.5  0 - 125 pg/mL  TROPONIN I      Result Value Range   Troponin I <0.30  <0.30 ng/mL  TROPONIN I      Result Value Range   Troponin I <0.30  <0.30 ng/mL  TROPONIN I       Result Value Range   Troponin I <0.30  <0.30 ng/mL  TROPONIN I      Result Value Range   Troponin I <0.30  <0.30 ng/mL  CBC WITH DIFFERENTIAL      Result Value Range   WBC 11.2 (*) 4.0 - 10.5 K/uL   RBC 4.13  3.87 - 5.11 MIL/uL   Hemoglobin 11.7 (*) 12.0 - 15.0 g/dL   HCT 78.2 (*) 95.6 - 21.3 %   MCV 86.7  78.0 - 100.0 fL   MCH 28.3  26.0 - 34.0 pg   MCHC 32.7  30.0 - 36.0 g/dL   RDW 08.6  57.8 - 46.9 %   Platelets 275  150 - 400 K/uL   Neutrophils Relative % 64  43 - 77 %   Neutro Abs 7.2  1.7 - 7.7 K/uL   Lymphocytes Relative 25  12 - 46 %   Lymphs Abs 2.8  0.7 - 4.0 K/uL   Monocytes Relative 9  3 - 12 %   Monocytes Absolute 1.0  0.1 - 1.0 K/uL   Eosinophils Relative 1  0 - 5 %   Eosinophils Absolute 0.2  0.0 - 0.7 K/uL   Basophils Relative 0  0 - 1 %   Basophils Absolute 0.0  0.0 - 0.1 K/uL  COMPREHENSIVE METABOLIC PANEL      Result Value Range   Sodium 139  135 - 145 mEq/L   Potassium 4.0  3.5 - 5.1 mEq/L   Chloride 103  96 - 112 mEq/L   CO2 26  19 - 32 mEq/L   Glucose, Bld 127 (*) 70 - 99 mg/dL   BUN 13  6 - 23 mg/dL   Creatinine, Ser 6.29  0.50 - 1.10 mg/dL   Calcium 9.7  8.4 - 52.8 mg/dL   Total Protein 6.9  6.0 - 8.3 g/dL   Albumin 3.6  3.5 - 5.2 g/dL   AST 19  0 - 37 U/L   ALT 19  0 - 35 U/L   Alkaline Phosphatase 66  39 - 117 U/L   Total Bilirubin 0.6  0.3 - 1.2 mg/dL   GFR calc non Af Amer 64 (*) >90 mL/min   GFR calc Af Amer 74 (*) >90 mL/min  PROTIME-INR      Result Value Range   Prothrombin Time 20.5 (*)  11.6 - 15.2 seconds   INR 1.82 (*) 0.00 - 1.49  APTT      Result Value Range   aPTT 47 (*) 24 - 37 seconds  GLUCOSE, CAPILLARY      Result Value Range   Glucose-Capillary 112 (*) 70 - 99 mg/dL   Comment 1 Documented in Chart     Comment 2 Notify RN    GLUCOSE, CAPILLARY      Result Value Range   Glucose-Capillary 105 (*) 70 - 99 mg/dL    CT Angio Chest PE W/Cm &/Or Wo Cm (Final result)  Result time: 07/16/13 00:38:46    Final result by Rad  Results In Interface (07/16/13 00:38:46)    Narrative:   CLINICAL DATA: DVT right leg, on Xarelto, shortness of breath, chest pain, question pulmonary embolism, history hypertension, diabetes, asthma  EXAM: CT ANGIOGRAPHY CHEST WITH CONTRAST  TECHNIQUE: Multidetector CT imaging of the chest was performed using the standard protocol during bolus administration of intravenous contrast. Multiplanar CT image reconstructions including MIPs were obtained to evaluate the vascular anatomy.  CONTRAST: OMNIPAQUE IOHEXOL 350 MG/ML SOLN  COMPARISON: None  FINDINGS: Aorta normal caliber without aneurysm or dissection.  Degradation of image quality secondary to body habitus.  Questionable filling defect in the right lower lobe pulmonary artery image 151 series 5.  Remaining pulmonary arteries appear grossly patent.  Unable to exclude small peripheral emboli on this exam particularly in lower lobes.  No thoracic adenopathy.  Visualized portion of upper abdomen unremarkable.  Lungs clear.  No infiltrate, pleural effusion or pneumothorax.  Scattered degenerative disc disease changes thoracic spine.  No acute osseous findings.  Review of the MIP images confirms the above findings.  IMPRESSION: Single questionable pulmonary embolus in aright lower lobe pulmonary artery.  Remainder of exam unremarkable.  Critical Value/emergent results were called by telephone at the time of interpretation on 07/16/2013 at 12:38 AM to [facility] , who verbally acknowledged these results.   Electronically Signed By: Ulyses Southward M.D. On: 07/16/2013 00:38             DG Chest 2 View (if patient has fever and/or COPD) (Final result)  Result time: 07/15/13 16:18:31    Final result by Rad Results In Interface (07/15/13 16:18:31)    Narrative:   CLINICAL DATA: Shortness of breath and cough  EXAM: CHEST 2 VIEW  COMPARISON: February 17, 2013  FINDINGS: Lungs are clear. Heart is  mildly enlarged with normal pulmonary vascularity. No adenopathy. There is degenerative change in the lower thoracic spine.  IMPRESSION: Heart mildly enlarged. No edema or consolidation.   Electronically Signed By: Bretta Bang M.D. On: 07/15/2013 16:18         MDM   Stacey [name] is a 52 y.o. female with a PMH of HTN, depression, type 2 DM, diverticulitis, asthma, leg cyst, pancreatitis, arthritis, c diff, neuropathic and musculoskeletal pain, sleep apnea, benign lipomas of the right leg, and DVT who presents to the ED for evaluation of leg pain and numbness.   Rechecks  10:30 PM = Percocet ordered for pain.   11:00 PM = Xarelto ordered.  Fentanyl ordered for continued pain.    1:45 AM = Patient anxious.  1 mg Ativan ordered.   Consults  2:15 AM = Spoke with Dr. Allena Katz who agrees to admit for observation.      Patient recently dx with a DVT 3 days ago in the ED at [facility] and placed on Xarelto.  She returned to  the ED for worsening leg pain.  She also complained of SOB, which has acutely worsened the past few days after being dx with a DVT.  Patient was found to have a possible PE on CT angio.  She had no hypoxia throughout her ED visit.  EKG negative for any acute ischemic changes.  Chest x-ray negative for an acute cardiopulmonary process, however, did show mild cardiomegaly.  Troponin negative.  It is unclear whether the patient has failed Xarelto as a tx option for her DVT.  Patient admitted for further evaluation and management of her PE and DVT.  Patient also has a chronic leg cellulitis, which is being managed as an OP with antibiotics and wound care.     Current Discharge Medication List      Final impressions: 1. Pulmonary emboli   2. Diabetes mellitus type II, controlled   3. Anxiety and depression   4. Bradycardia   5. Cellulitis of leg, right   6. Chest pain, atypical   7. HTN (hypertension)   8. OSA (obstructive sleep apnea)       Luiz Iron PA-C   This patient was discussed with Dr. Clayborne Dana, PA-C 07/17/13 1231

## 2013-07-15 NOTE — ED Notes (Addendum)
Pt tx at [facility] for DVT to R leg.  Placed on Xeralto. Yesterday pt states her "whole head feels numb" and her R leg feels numb and very painful.  Pt was triaged at [facility], but the wait was too long, so came here. Pt also feels more sob than normal.

## 2013-07-15 NOTE — ED Notes (Signed)
Pt no longer out in waiting room, registration states that pt and her son had left outside without informing staff of leaving

## 2013-07-15 NOTE — ED Notes (Signed)
Recent dx of DVT, c/o SOB and throbbing to back of head, denies weakness

## 2013-07-16 DIAGNOSIS — R0789 Other chest pain: Secondary | ICD-10-CM

## 2013-07-16 DIAGNOSIS — G4733 Obstructive sleep apnea (adult) (pediatric): Secondary | ICD-10-CM

## 2013-07-16 DIAGNOSIS — E119 Type 2 diabetes mellitus without complications: Secondary | ICD-10-CM

## 2013-07-16 DIAGNOSIS — I498 Other specified cardiac arrhythmias: Secondary | ICD-10-CM

## 2013-07-16 DIAGNOSIS — L02419 Cutaneous abscess of limb, unspecified: Secondary | ICD-10-CM

## 2013-07-16 DIAGNOSIS — I2699 Other pulmonary embolism without acute cor pulmonale: Secondary | ICD-10-CM | POA: Diagnosis present

## 2013-07-16 DIAGNOSIS — F341 Dysthymic disorder: Secondary | ICD-10-CM

## 2013-07-16 DIAGNOSIS — I10 Essential (primary) hypertension: Secondary | ICD-10-CM

## 2013-07-16 DIAGNOSIS — I517 Cardiomegaly: Secondary | ICD-10-CM

## 2013-07-16 DIAGNOSIS — R0602 Shortness of breath: Secondary | ICD-10-CM | POA: Diagnosis present

## 2013-07-16 LAB — CBC WITH DIFFERENTIAL/PLATELET
Basophils Absolute: 0 10*3/uL (ref 0.0–0.1)
Eosinophils Absolute: 0.2 10*3/uL (ref 0.0–0.7)
Hemoglobin: 11.7 g/dL — ABNORMAL LOW (ref 12.0–15.0)
Lymphocytes Relative: 25 % (ref 12–46)
Lymphs Abs: 2.8 10*3/uL (ref 0.7–4.0)
MCHC: 32.7 g/dL (ref 30.0–36.0)
MCV: 86.7 fL (ref 78.0–100.0)
Monocytes Absolute: 1 10*3/uL (ref 0.1–1.0)
Monocytes Relative: 9 % (ref 3–12)
Neutro Abs: 7.2 10*3/uL (ref 1.7–7.7)
Neutrophils Relative %: 64 % (ref 43–77)
RBC: 4.13 MIL/uL (ref 3.87–5.11)
WBC: 11.2 10*3/uL — ABNORMAL HIGH (ref 4.0–10.5)

## 2013-07-16 LAB — TROPONIN I
Troponin I: <0.3 ng/mL (ref <0.30)
Troponin I: <0.3 ng/mL (ref <0.30)

## 2013-07-16 LAB — COMPREHENSIVE METABOLIC PANEL
ALT: 19 U/L (ref 0–35)
Alkaline Phosphatase: 66 U/L (ref 39–117)
BUN: 13 mg/dL (ref 6–23)
Chloride: 103 mEq/L (ref 96–112)
GFR calc Af Amer: 74 mL/min — ABNORMAL LOW (ref >90)
Glucose, Bld: 127 mg/dL — ABNORMAL HIGH (ref 70–99)
Potassium: 4 mEq/L (ref 3.5–5.1)
Total Bilirubin: 0.6 mg/dL (ref 0.3–1.2)

## 2013-07-16 LAB — GLUCOSE, CAPILLARY: Glucose-Capillary: 105 mg/dL — ABNORMAL HIGH (ref 70–99)

## 2013-07-16 LAB — PROTIME-INR: Prothrombin Time: 20.5 seconds — ABNORMAL HIGH (ref 11.6–15.2)

## 2013-07-16 MED ORDER — BUSPIRONE HCL 15 MG PO TABS
15.0000 mg | ORAL_TABLET | Freq: Three times a day (TID) | ORAL | Status: DC
  Administered 2013-07-16 – 2013-07-18 (×8): 15 mg via ORAL
  Filled 2013-07-16 (×9): qty 1

## 2013-07-16 MED ORDER — RIVAROXABAN 15 MG PO TABS
15.0000 mg | ORAL_TABLET | Freq: Two times a day (BID) | ORAL | Status: DC
  Administered 2013-07-16 – 2013-07-17 (×3): 15 mg via ORAL
  Filled 2013-07-16 (×4): qty 1

## 2013-07-16 MED ORDER — OXYCODONE-ACETAMINOPHEN 5-325 MG PO TABS
2.0000 | ORAL_TABLET | Freq: Once | ORAL | Status: AC
  Administered 2013-07-16: 2 via ORAL
  Filled 2013-07-16: qty 2

## 2013-07-16 MED ORDER — CEPHALEXIN 500 MG PO CAPS
500.0000 mg | ORAL_CAPSULE | Freq: Three times a day (TID) | ORAL | Status: DC
  Administered 2013-07-16 – 2013-07-18 (×8): 500 mg via ORAL
  Filled 2013-07-16 (×9): qty 1

## 2013-07-16 MED ORDER — ESCITALOPRAM OXALATE 20 MG PO TABS
20.0000 mg | ORAL_TABLET | Freq: Every day | ORAL | Status: DC
  Administered 2013-07-16 – 2013-07-18 (×3): 20 mg via ORAL
  Filled 2013-07-16 (×3): qty 1

## 2013-07-16 MED ORDER — ACETAMINOPHEN 325 MG PO TABS
650.0000 mg | ORAL_TABLET | ORAL | Status: DC | PRN
  Administered 2013-07-16 – 2013-07-17 (×2): 650 mg via ORAL
  Filled 2013-07-16 (×2): qty 2

## 2013-07-16 MED ORDER — LOPERAMIDE HCL 2 MG PO CAPS: 2.0000 mg | ORAL_CAPSULE | ORAL | Status: DC | PRN

## 2013-07-16 MED ORDER — LORAZEPAM 1 MG PO TABS
1.0000 mg | ORAL_TABLET | Freq: Four times a day (QID) | ORAL | Status: DC | PRN
  Administered 2013-07-16 – 2013-07-17 (×2): 1 mg via ORAL
  Filled 2013-07-16 (×2): qty 1

## 2013-07-16 MED ORDER — ONDANSETRON HCL 4 MG/2ML IJ SOLN: 4.0000 mg | Freq: Four times a day (QID) | INTRAMUSCULAR | Status: DC | PRN

## 2013-07-16 MED ORDER — HYDROCHLOROTHIAZIDE 25 MG PO TABS
25.0000 mg | ORAL_TABLET | Freq: Every day | ORAL | Status: DC
  Filled 2013-07-16: qty 1

## 2013-07-16 MED ORDER — BUPROPION HCL ER (XL) 300 MG PO TB24
300.0000 mg | ORAL_TABLET | Freq: Every day | ORAL | Status: DC
  Administered 2013-07-16 – 2013-07-18 (×3): 300 mg via ORAL
  Filled 2013-07-16 (×3): qty 1

## 2013-07-16 MED ORDER — LORAZEPAM 2 MG/ML IJ SOLN
1.0000 mg | Freq: Once | INTRAMUSCULAR | Status: AC
  Administered 2013-07-16: 1 mg via INTRAVENOUS
  Filled 2013-07-16: qty 1

## 2013-07-16 MED ORDER — HYDROCHLOROTHIAZIDE 25 MG PO TABS
25.0000 mg | ORAL_TABLET | Freq: Every day | ORAL | Status: DC
  Administered 2013-07-16 – 2013-07-17 (×3): 25 mg via ORAL
  Filled 2013-07-16 (×4): qty 1

## 2013-07-16 MED ORDER — IOHEXOL 350 MG/ML SOLN
100.0000 mL | Freq: Once | INTRAVENOUS | Status: AC | PRN
  Administered 2013-07-16: 100 mL via INTRAVENOUS

## 2013-07-16 MED ORDER — HYDROCODONE-ACETAMINOPHEN 5-325 MG PO TABS
1.0000 | ORAL_TABLET | ORAL | Status: AC | PRN
  Administered 2013-07-16 – 2013-07-17 (×2): 1 via ORAL
  Filled 2013-07-16 (×2): qty 1

## 2013-07-16 MED ORDER — PRO-STAT SUGAR FREE PO LIQD
30.0000 mL | Freq: Three times a day (TID) | ORAL | Status: DC
  Administered 2013-07-16 – 2013-07-18 (×6): 30 mL via ORAL
  Filled 2013-07-16 (×8): qty 30

## 2013-07-16 MED ORDER — LORAZEPAM 1 MG PO TABS: 1.0000 mg | ORAL_TABLET | Freq: Once | ORAL | Status: DC

## 2013-07-16 NOTE — Consult Note (Addendum)
WOC wound consult note Reason for Consult: Consult requested for right posterior thigh wound Wound type:Chronic full thickness wound according to pt.  Progress notes describe this site as a previous cyst.  Affected area has chronic pressure to posterior thigh and unable to relieve off this site R/T large body habitus.  She states she has home health following for assessment and dressing changes.  She has been using a foam dressing. Measurement:6X6X.2cm Wound bed: dark red and moist Drainage (amount, consistency, odor) mod amt dark red drainage, no odor Periwound: intact skin surrounding.  Affected area located in a deep skin fold Dressing procedure/placement/frequency: Foam dressing to protect and absorb drainage.   Please re-consult if further assistance is needed.  Thank-you,  Cammie Mcgee MSN, RN, CWOCN, [place], CNS [phone]

## 2013-07-16 NOTE — Progress Notes (Signed)
Chaplain responded to spiritual care consult request for emotional and spiritual support. Pt is grieving several recent deaths among family and close friends. Pt is very "fearful that blood clot will move to her brain and she will die." Pt is concerned for her two teenage sons, whom she says are struggling with her health changes. Pt feels that "God does not listen to her anymore" because God did not answer her prayers to prevent or correct the blood clots. Pt says she thinks that "God has left her and moved on to someone else." Chaplain explored these feelings of betrayal, abandonment, anger, and fear with the patient. Chaplain also explored with pt spiritual and theological themes of God's providence and power, prayer, mystery, and the "dark night of the soul." Pt said she does not relate with any biblical story or character, but asked for a Bible and other recourses. Chaplain brought pt a New Testament w/ Psalms and a brochure on hope.   Guy Sandifer [place], [place] [phone] On-Call: [phone]

## 2013-07-16 NOTE — Progress Notes (Signed)
  Echocardiogram 2D Echocardiogram has been performed.  Cathie Beams 07/16/2013, 1:43 PM

## 2013-07-16 NOTE — Progress Notes (Signed)
Nutrition Brief Note  Patient identified on the Malnutrition Screening Tool (MST) Report  Wt Readings from Last 15 Encounters:  07/16/13 427 lb 0.5 oz (193.7 kg)  07/15/13 432 lb (195.954 kg)  06/03/13 324 lb 9.6 oz (147.238 kg)  03/28/13 445 lb (201.851 kg)  03/04/13 449 lb (203.665 kg)  02/17/13 449 lb (203.665 kg)  02/07/13 449 lb (203.665 kg)  02/04/13 439 lb (199.129 kg)  01/15/13 462 lb (209.562 kg)  10/28/12 462 lb 1.6 oz (209.607 kg)  10/09/12 449 lb (203.665 kg)  09/06/12 455 lb 1.6 oz (206.432 kg)  06/23/12 451 lb 15.1 oz (205 kg)  11/09/11 475 lb 11.2 oz (215.776 kg)  02/16/11 469 lb 3 oz (212.822 kg)    Body mass index is 68.96 kg/(m^2). Patient meets criteria for extreme obesity based on current BMI.   Current diet order is Heart Healthy, patient is consuming approximately 100% of meals at this time. Labs and medications reviewed.   Patient with increased protein needs due to size and cellulitis.  Protein needs estimated at 140-150 gm daily.    Patient with an approximate 20 lb weight loss in the past 4 months.  Patient reports that this was not purposeful but was just not as hungry.  Admitted to an unhealthy diet consisting mostly of cakes, cookies, chips, soda, and coolade but would eat large portions of steamed chicken breasts.    Patient wishes to lose weight and sees the implication on her health and also the health of her children who are also obese.  States that she has started to eliminate many of her sweets and is now avoiding chips and soda.  Educated patient on increased protein needs and sources of protein as well as a general healthy diet for weight loss.  Recommended changing to low fat milk and continue with changes that she has made above.    Teach back method used and patient is able to verbalize.  Handouts provided included:  "Weight Loss Tips", "Cooking tips for weight loss", and "Heart Healthy Nutrition Therapy".  RD name and contact number provided for  any questions.  Encouraged Outpatient follow up with RD as desired.    Will Add Prostat (30 ml) tid.  Nutrition Dx:  Increased  Please consult for any further nutrition needs.    Oran Rein, RD, LDN Clinical Inpatient Dietitian Pager:  [phone] Weekend and after hours pager:  [phone]

## 2013-07-16 NOTE — H&P (Signed)
@LOGO @ [facility] History and Physical  Patient: Stacey [name]  WGN:[id number]  DOB: 03-26-1961  DOS: the patient was seen and examined on 07/16/2013 PCP: Evelene Croon, MD  Chief Complaint: Shortness of breath and chest tightness  HPI: Stacey [name] is a 52 y.o. female with Past medical history of hypertension, depression, diabetes mellitus, morbid obesity, sleep apnea, recently diagnosed DVT, recently diagnosed right leg cellulitis. andThe patient is coming from home. The patient presented with the complaints of shortness of breath. She mentions that she has baseline shortness of breath on exertion when she walks around from one room to the other room as well as to the restroom. She uses a cane to walk. She mentions that since last few days she has been feeling more short of breath than his baseline and also had some chest tightness. Currently she mentions her symptoms are getting improved. She complain of some dizziness. She denies any focal neurological deficit or orthopnea or PND. She also denies any fever she has a dry cough. She denies any diarrhea or fixation or active bleeding or burning urination or nausea or vomiting. She mentions is compliant with all her medications.  Review of Systems: as mentioned in the history of present illness.  A Comprehensive review of the other systems is negative.  Past Medical History  Diagnosis Date  . Hypertension   . Depression   . Type 2 diabetes mellitus   . Diverticulitis   . Asthma   . Cyst     pt has "cyst" to legs for years, areas drain at time  . Pancreatitis   . Arthritis   . C. difficile colitis 11/08/2011  . Musculoskeletal pain 11/08/2011  . Neuropathic pain 11/10/2011  . Sleep apnea with use of continuous positive airway pressure (CPAP)     uses CPAP at night  . 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 DVT (deep venous thrombosis)    Past Surgical History  Procedure Laterality Date   . Tonsillectomy    . Cholecystectomy    . Cesarean section    . Nose surgery     Social History:  reports that she has never smoked. She has never used smokeless tobacco. She reports that she does not drink alcohol or use illicit drugs. Independent for most of her  ADL.  Allergies  Allergen Reactions  . Nitroglycerin Anxiety  . Tramadol Anaphylaxis and Swelling  . Dilaudid [Hydromorphone Hcl] Other (See Comments)    Hallucination  . Levaquin [Levofloxacin In D5w] Nausea And Vomiting  . Trazodone And Nefazodone   . Sulfa Antibiotics Rash    History reviewed. No pertinent family history.  Prior to Admission medications   Medication Sig Start Date End Date Taking? Authorizing Provider  buPROPion (WELLBUTRIN XL) 300 MG 24 hr tablet Take 300 mg by mouth daily.    Yes Historical Provider, MD  busPIRone (BUSPAR) 15 MG tablet Take 15 mg by mouth 3 (three) times daily.    Yes Historical Provider, MD  cephALEXin (KEFLEX) 500 MG capsule Take 500 mg by mouth 3 (three) times daily.   Yes Historical Provider, MD  clotrimazole-betamethasone (LOTRISONE) cream Apply 1 application topically 2 (two) times daily. 02/04/13  Yes Benny Lennert, MD  escitalopram (LEXAPRO) 20 MG tablet Take 20 mg by mouth daily.     Yes Historical Provider, MD  hydrochlorothiazide (HYDRODIURIL) 25 MG tablet Take 25 mg by mouth daily.    Yes Historical Provider, MD  loperamide (IMODIUM)  2 MG capsule Take 2 mg by mouth as needed for diarrhea or loose stools.    Yes Historical Provider, MD  LORazepam (ATIVAN) 1 MG tablet Take 1 mg by mouth 4 (four) times daily as needed for anxiety.    Yes Historical Provider, MD  Rivaroxaban (XARELTO) 15 MG TABS tablet Take 15 mg by mouth 2 (two) times daily with a meal.   Yes Historical Provider, MD    Physical Exam: Filed Vitals:   07/16/13 0100 07/16/13 0115 07/16/13 0130 07/16/13 0248  BP: 151/67 154/48 162/49   Pulse: 67 59 59 77  Temp:    98.6 F (37 C)  TempSrc:    Oral   Resp: 11 13 19 18   Height:    5\' 6"  (1.676 m)  Weight:    193.7 kg (427 lb 0.5 oz)  SpO2: 97% 99% 99% 97%    General: Alert, Awake and Oriented to Time, Place and Person. Appear in mild distress Eyes: PERRL ENT: Oral Mucosa clear moist. Neck: no JVD Cardiovascular: S1 and S2 Present, no Murmur, Peripheral Pulses Present Respiratory: Bilateral Air entry equal and Decreased, Clear to Auscultation,  no Crackles,no wheezes Abdomen: Bowel Sound Present, Soft and Non tender Skin: no Rash Extremities:  bilateral Pedal edema,  bilateral calf tenderness Neurologic: Grossly Unremarkable.  Labs on Admission:  CBC:  Recent Labs Lab 07/15/13 1641  WBC 9.5  HGB 13.3  HCT 39.1  MCV 85.7  PLT 304    CMP     Component Value Date/Time   NA 138 07/15/2013 1641   K 3.9 07/15/2013 1641   CL 101 07/15/2013 1641   CO2 22 07/15/2013 1641   GLUCOSE 99 07/15/2013 1641   BUN 13 07/15/2013 1641   CREATININE 0.77 07/15/2013 1641   CALCIUM 10.3 07/15/2013 1641   PROT 7.2 06/01/2013 1715   ALBUMIN 3.5 06/01/2013 1715   AST 21 06/01/2013 1715   ALT 19 06/01/2013 1715   ALKPHOS 74 06/01/2013 1715   BILITOT 0.3 06/01/2013 1715   GFRNONAA >90 07/15/2013 1641   GFRAA >90 07/15/2013 1641    No results found for this basename: LIPASE, AMYLASE,  in the last 168 hours No results found for this basename: AMMONIA,  in the last 168 hours   Recent Labs Lab 07/15/13 2138  TROPONINI <0.30   BNP (last 3 results)  Recent Labs  07/15/13 2138  PROBNP 49.5    Radiological Exams on Admission: Dg Chest 2 View (if Patient Has Fever And/or Copd)  07/15/2013   CLINICAL DATA:  Shortness of breath and cough  EXAM: CHEST  2 VIEW  COMPARISON:  February 17, 2013  FINDINGS: Lungs are clear. Heart is mildly enlarged with normal pulmonary vascularity. No adenopathy. There is degenerative change in the lower thoracic spine.  IMPRESSION: Heart mildly enlarged.  No edema or consolidation.   Electronically Signed   By:  Bretta Bang M.D.   On: 07/15/2013 16:18   Ct Angio Chest Pe W/cm &/or Wo Cm  07/16/2013   CLINICAL DATA:  DVT right leg, on Xarelto, shortness of breath, chest pain, question pulmonary embolism, history hypertension, diabetes, asthma  EXAM: CT ANGIOGRAPHY CHEST WITH CONTRAST  TECHNIQUE: Multidetector CT imaging of the chest was performed using the standard protocol during bolus administration of intravenous contrast. Multiplanar CT image reconstructions including MIPs were obtained to evaluate the vascular anatomy.  CONTRAST:  OMNIPAQUE IOHEXOL 350 MG/ML SOLN  COMPARISON:  None  FINDINGS: Aorta normal caliber without aneurysm or  dissection.  Degradation of image quality secondary to body habitus.  Questionable filling defect in the right lower lobe pulmonary artery image 151 series 5.  Remaining pulmonary arteries appear grossly patent.  Unable to exclude small peripheral emboli on this exam particularly in lower lobes.  No thoracic adenopathy.  Visualized portion of upper abdomen unremarkable.  Lungs clear.  No infiltrate, pleural effusion or pneumothorax.  Scattered degenerative disc disease changes thoracic spine.  No acute osseous findings.  Review of the MIP images confirms the above findings.  IMPRESSION: Single questionable pulmonary embolus in aright lower lobe pulmonary artery.  Remainder of exam unremarkable.  Critical Value/emergent results were called by telephone at the time of interpretation on 07/16/2013 at 12:38 AM to [facility] , who verbally acknowledged these results.   Electronically Signed   By: Ulyses Southward M.D.   On: 07/16/2013 00:38    EKG: Independently reviewed. normal sinus rhythm.  Assessment/Plan Principal Problem:   Short of breath on exertion Active Problems:   Morbid obesity   HTN (hypertension)   OSA (obstructive sleep apnea)   Cellulitis of leg, right   Pulmonary embolism   1. Short of breath on exertion  the patient is presenting with shortness  of breath on exertion. She is saturating 100% on room air with heart rate in the range of 50s to 60s and blood pressure in the range of 140s to 160s systolic.she mentions he is compliant with her Xarelto.she has undergone a CT scan of the chest which is suggestive of possible pulmonary embolism. With her recent DVT without any significant hypoxia, tachycardia, hypertension it is unlikely that she has failed Xarelto since she has never been worked up for a PE when she was diagnosed with DVT. At present she has received a dose of Xarelto, I would continue her on Xarelto and monitor her on telemetry Considering shortness of breath on exertion is in generally good and I would also consider troponins for the patient. I would also obtain an echocardiogram which will suggest possibility of RV strain versus pelvic dysfunction suggesting cardiac etiology.  2.Hypertension Continue home antihypertensive blood pressure at present stable  3.Sleep apnea  Continue CPAP at 9 cm pressure   DVT Prophylaxis: on Xarelto  Nutrition:  cardiac diet  Code Status:  full  Family Communication:  for an was present at bedside, opportunity was given to ask question and all questions were answered satisfactorily at the time of interview. Disposition: Admitted to observation in telemetry unit.  Author: Lynden Oxford, MD [facility] Pager: [phone] 07/16/2013, 3:47 AM    If 7PM-7AM, please contact night-coverage [email or web address] Password [email or web address]

## 2013-07-16 NOTE — Plan of Care (Signed)
Problem: Consults Goal: Diagnosis - Venous Thromboembolism (VTE) Choose a selection Outcome: Completed/Met Date Met:  07/16/13 PE (Pulmonary Embolism)

## 2013-07-16 NOTE — ED Notes (Signed)
Patient transported back from CT 

## 2013-07-16 NOTE — Care Management Note (Signed)
    Page 1 of 2   07/18/2013     4:42:59 PM   CARE MANAGEMENT NOTE 07/18/2013  Patient:  Stacey [name], Stacey [name]   Account Number:  [id number]  Date Initiated:  07/16/2013  Documentation initiated by:  Meyer Arora  Subjective/Objective Assessment:   PT ADM ON 12/15 WITH RT PULMONARY EMBOLUS.  PTA, PT INDEPENDENT, LIVES WITH FAMILY.     Action/Plan:   WILL FOLLOW FOR DC NEEDS AS PT PROGRESSES.   Anticipated DC Date:  07/19/2013   Anticipated DC Plan:  HOME W HOME HEALTH SERVICES      DC Planning Services  CM consult      [facility] Choice  HOME HEALTH  Resumption Of Svcs/PTA Provider   Choice offered to / List presented to:  C-1 Patient        HH arranged  HH-1 RN      [facility] agency  [facility]   Status of service:  Completed, signed off Medicare Important Message given?   (If response is "NO", the following Medicare IM given date fields will be blank) Date Medicare IM given:   Date Additional Medicare IM given:    Discharge Disposition:  HOME W HOME HEALTH SERVICES  Per UR Regulation:  Reviewed for med. necessity/level of care/duration of stay  If discussed at Long Length of Stay Meetings, dates discussed:    Comments:  07/18/13 Seldon Barrell,RN,BSN [phone] PT NEEDS HOME LOVENOX AT DC, BUT LISTED PCP REFUSING TO FOLLOW UP WITH PT/INR LABS OVER THE WEEKEND.  UPON INVESTIGATION, FOUND OUT THAT PT HAS NEVER SEEN DR NIEMEYER, SHE HAS SCHEDULED SEVERAL APPTS, BUT CANCELLED, SO MD UNWILLING TO FOLLOW HER SIGHT UNSEEN.  SHE HAS FOLLOW UP APPT SCHEDULED FOR MONDAY, DEC 22.  PT QUITE UPSET ABOUT THIS, BUT EVENTUALLY SEEMED TO UNDERSTAND. EVENTUALLY WAS ABLE TO OBTAIN MD TO FOLLOW FOR THESE THREE DAYS; [facility] TO FOLLOW UP WITH PT ON 12/19, 12/20, AND 12/21 FOR PT/INR DRAWS AND CALL RESULTS TO DR Geoffry Paradise.  DR Lacie Scotts TO RESUME CARE FOR PT AND LAB DRAWS ON MON, 12/22.  NOTIFIED DR Suanne Marker; PT WILL BE DC'D TODAY WITH HH CARE TO FOLLOW.

## 2013-07-16 NOTE — Progress Notes (Signed)
UR completed 

## 2013-07-16 NOTE — Progress Notes (Signed)
ANTICOAGULATION CONSULT NOTE - Initial Consult  Pharmacy Consult for Xarelto Indication: DVT/PE  Allergies  Allergen Reactions  . Nitroglycerin Anxiety  . Tramadol Anaphylaxis and Swelling  . Dilaudid [Hydromorphone Hcl] Other (See Comments)    Hallucination  . Levaquin [Levofloxacin In D5w] Nausea And Vomiting  . Trazodone And Nefazodone   . Sulfa Antibiotics Rash    Patient Measurements: Height: 5\' 6"  (167.6 cm) Weight: 427 lb 0.5 oz (193.7 kg) IBW/kg (Calculated) : 59.3  Vital Signs: Temp: 98.6 F (37 C) (12/16 0248) Temp src: Oral (12/16 0248) BP: 162/49 mmHg (12/16 0130) Pulse Rate: 77 (12/16 0248)  Labs:  Recent Labs  07/15/13 1641 07/15/13 2138  HGB 13.3  --   HCT 39.1  --   PLT 304  --   CREATININE 0.77  --   TROPONINI  --  <0.30    Estimated Creatinine Clearance: 150.2 ml/min (by C-G formula based on Cr of 0.77).   Medical History: Past Medical History  Diagnosis Date  . Hypertension   . Depression   . Type 2 diabetes mellitus   . Diverticulitis   . Asthma   . Cyst     pt has "cyst" to legs for years, areas drain at time  . Pancreatitis   . Arthritis   . C. difficile colitis 11/08/2011  . Musculoskeletal pain 11/08/2011  . Neuropathic pain 11/10/2011  . Sleep apnea with use of continuous positive airway pressure (CPAP)     uses CPAP at night  . 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 DVT (deep venous thrombosis)     Medications:  Prescriptions prior to admission  Medication Sig Dispense Refill  . buPROPion (WELLBUTRIN XL) 300 MG 24 hr tablet Take 300 mg by mouth daily.       . busPIRone (BUSPAR) 15 MG tablet Take 15 mg by mouth 3 (three) times daily.       . cephALEXin (KEFLEX) 500 MG capsule Take 500 mg by mouth 3 (three) times daily.      . clotrimazole-betamethasone (LOTRISONE) cream Apply 1 application topically 2 (two) times daily.      Marland Kitchen escitalopram (LEXAPRO) 20 MG tablet Take 20 mg by mouth daily.         . hydrochlorothiazide (HYDRODIURIL) 25 MG tablet Take 25 mg by mouth daily.       Marland Kitchen loperamide (IMODIUM) 2 MG capsule Take 2 mg by mouth as needed for diarrhea or loose stools.       Marland Kitchen LORazepam (ATIVAN) 1 MG tablet Take 1 mg by mouth 4 (four) times daily as needed for anxiety.       . Rivaroxaban (XARELTO) 15 MG TABS tablet Take 15 mg by mouth 2 (two) times daily with a meal.        Assessment: 52 yo female with recent DVT, now found to have PE on Chest CT, for Xarelto   Plan:  Continue Xarelto 15 mg BID  Eddie Candle 07/16/2013,3:01 AM

## 2013-07-16 NOTE — Progress Notes (Signed)
[facility] PROGRESS NOTE  [name] AVW:[id number] DOB: Feb 25, 1961 DOA: 07/15/2013 PCP: Evelene Croon, MD  Patient is seen examined, her sister at the bedside;  -symptoms are improving; please see full A&P per HPI   Stacey [name]  [facility] Pager [phone]. If 7PM-7AM, please contact night-coverage at [email or web address], password [facility] 07/16/2013, 10:02 AM  LOS: 1 day

## 2013-07-17 LAB — CULTURE, BLOOD (SINGLE)

## 2013-07-17 MED ORDER — WARFARIN - PHARMACIST DOSING INPATIENT
Freq: Every day | Status: DC
  Administered 2013-07-18: 18:00:00

## 2013-07-17 MED ORDER — ENOXAPARIN SODIUM 150 MG/ML ~~LOC~~ SOLN
1.0000 mg/kg | Freq: Two times a day (BID) | SUBCUTANEOUS | Status: DC
  Filled 2013-07-17 (×2): qty 2

## 2013-07-17 MED ORDER — WARFARIN SODIUM 7.5 MG PO TABS
7.5000 mg | ORAL_TABLET | Freq: Once | ORAL | Status: AC
  Administered 2013-07-17: 7.5 mg via ORAL
  Filled 2013-07-17: qty 1

## 2013-07-17 MED ORDER — HYDROCODONE-ACETAMINOPHEN 5-325 MG PO TABS
1.0000 | ORAL_TABLET | Freq: Four times a day (QID) | ORAL | Status: DC | PRN
  Administered 2013-07-17 – 2013-07-18 (×3): 1 via ORAL
  Filled 2013-07-17 (×3): qty 1

## 2013-07-17 NOTE — Progress Notes (Addendum)
[facility] PROGRESS NOTE  Stacey [name] RUE:[id number] DOB: May 26, 1961 DOA: 07/15/2013 PCP: Evelene Croon, MD  Assessment/Plan: 1. Probable PE,  - CT scan of the chest which is suggestive of possible pulmonary embolism.  With her recent DVT and worsening SOB on presentation, and she admits to having sob when DVT was diagnosed although she was not worked up for PE then. -CEs are neg - I discussed pt with Heme/Dr Granfortuna and he states at the the efficacy of xarelto/NOAC has not been proven in morbidly obese pts >300lbs and he would recommend for this morbidly obese pt to treat this as a xarelto failure and switch pt to coumadin/lovenox . -Discussed this with pt and she is agreeable will plan    Echo with no RV strain reported EF 55-60% 2.Hypertension  Continue home antihypertensive blood pressure at present stable  3.Sleep apnea  Continue CPAP at 9 cm pressure   Code Status: FULL Family Communication: family at bedside Disposition Plan: to home likely in am with lovenox and coumadin   Consultants:  Heme phone consult  Procedures:  echo Study Conclusions  - Procedure narrative: Transthoracic echocardiography. Image quality was adequate. The study was technically difficult, as a result of poor acoustic windows and body habitus. - Left ventricle: The cavity size was at the upper limits of normal. Wall thickness was increased in a pattern of mild LVH. There was mild concentric hypertrophy. Systolic function was normal. The estimated ejection fraction was in the range of 55% to 60%. Although no diagnostic regional wall motion abnormality was identified, this possibility cannot be completely excluded on the basis of this study. Findings consistent with left ventricular diastolic dysfunction. There was no evidence of elevated ventricular filling pressure by Doppler parameters. - Atrial septum: No defect or patent foramen ovale  was identified.  Antibiotics:  none  HPI/Subjective: Pt reports some R. Upper leg pain, denies CP, and states breathing better  Objective: Filed Vitals:   07/17/13 1352  BP: 163/90  Pulse: 65  Temp: 98 F (36.7 C)  Resp: 17    Intake/Output Summary (Last 24 hours) at 07/17/13 1707 Last data filed at 07/17/13 1300  Gross per 24 hour  Intake    680 ml  Output   2400 ml  Net  -1720 ml   Filed Weights   07/15/13 1522 07/16/13 0248  Weight: 196.135 kg (432 lb 6.4 oz) 193.7 kg (427 lb 0.5 oz)    Exam:  General: alert & oriented x 3 In NAD Cardiovascular: RRR, nl S1 s2 Respiratory: decreased BS at bases, clear Abdomen: soft +BS NT/ND, no masses palpable Extremities: No cyanosis and no edema    Data Reviewed: Basic Metabolic Panel:  Recent Labs Lab 07/15/13 1641 07/16/13 0634  NA 138 139  K 3.9 4.0  CL 101 103  CO2 22 26  GLUCOSE 99 127*  BUN 13 13  CREATININE 0.77 1.01  CALCIUM 10.3 9.7   Liver Function Tests:  Recent Labs Lab 07/16/13 0634  AST 19  ALT 19  ALKPHOS 66  BILITOT 0.6  PROT 6.9  ALBUMIN 3.6   No results found for this basename: LIPASE, AMYLASE,  in the last 168 hours No results found for this basename: AMMONIA,  in the last 168 hours CBC:  Recent Labs Lab 07/15/13 1641 07/16/13 0634  WBC 9.5 11.2*  NEUTROABS  --  7.2  HGB 13.3 11.7*  HCT 39.1 35.8*  MCV 85.7 86.7  PLT 304 275   Cardiac Enzymes:  Recent Labs Lab 07/15/13 2138 07/16/13 0320 07/16/13 0830 07/16/13 1430  TROPONINI <0.30 <0.30 <0.30 <0.30   BNP (last 3 results)  Recent Labs  07/15/13 2138  PROBNP 49.5   CBG:  Recent Labs Lab 07/15/13 1649 07/16/13 0611 07/16/13 1613  GLUCAP 94 112* 105*    No results found for this or any previous visit (from the past 240 hour(s)).   Studies: Ct Angio Chest Pe W/cm &/or Wo Cm  07/16/2013   CLINICAL DATA:  DVT right leg, on Xarelto, shortness of breath, chest pain, question pulmonary embolism, history  hypertension, diabetes, asthma  EXAM: CT ANGIOGRAPHY CHEST WITH CONTRAST  TECHNIQUE: Multidetector CT imaging of the chest was performed using the standard protocol during bolus administration of intravenous contrast. Multiplanar CT image reconstructions including MIPs were obtained to evaluate the vascular anatomy.  CONTRAST:  OMNIPAQUE IOHEXOL 350 MG/ML SOLN  COMPARISON:  None  FINDINGS: Aorta normal caliber without aneurysm or dissection.  Degradation of image quality secondary to body habitus.  Questionable filling defect in the right lower lobe pulmonary artery image 151 series 5.  Remaining pulmonary arteries appear grossly patent.  Unable to exclude small peripheral emboli on this exam particularly in lower lobes.  No thoracic adenopathy.  Visualized portion of upper abdomen unremarkable.  Lungs clear.  No infiltrate, pleural effusion or pneumothorax.  Scattered degenerative disc disease changes thoracic spine.  No acute osseous findings.  Review of the MIP images confirms the above findings.  IMPRESSION: Single questionable pulmonary embolus in aright lower lobe pulmonary artery.  Remainder of exam unremarkable.  Critical Value/emergent results were called by telephone at the time of interpretation on 07/16/2013 at 12:38 AM to [facility] , who verbally acknowledged these results.   Electronically Signed   By: Ulyses Southward M.D.   On: 07/16/2013 00:38    Scheduled Meds: . buPROPion  300 mg Oral Daily  . busPIRone  15 mg Oral TID  . cephALEXin  500 mg Oral TID  . [START ON 07/18/2013] enoxaparin (LOVENOX) injection  1 mg/kg Subcutaneous Q12H  . escitalopram  20 mg Oral Daily  . feeding supplement (PRO-STAT SUGAR FREE 64)  30 mL Oral TID WC  . hydrochlorothiazide  25 mg Oral QHS  . warfarin  7.5 mg Oral ONCE-1800  . Warfarin - Pharmacist Dosing Inpatient   Does not apply q1800   Continuous Infusions:   Principal Problem:   Short of breath on exertion Active Problems:   Morbid  obesity   HTN (hypertension)   OSA (obstructive sleep apnea)   Cellulitis of leg, right   Pulmonary embolism    Time spent: 25    [facility] C  [facility] Pager [phone]. If 7PM-7AM, please contact night-coverage at [email or web address], password [facility] 07/17/2013, 5:07 PM  LOS: 2 days

## 2013-07-17 NOTE — Progress Notes (Signed)
Chaplain responded to request of Nursing Unit to offer pastoral presence to patient.  Chaplain presented to patient  She appears upset and expresses her concerned about her medical  Status. She discussed her desire to get better, and be home with her family.  The patients main request was to talk  her fears, be reassured and to request prayer for healing.  A follow-up visit will be done at a later time, and the assigned Unit Chaplain will be informed of patients request for on-going spiritual support. Chaplain Janell Quiet [phone]

## 2013-07-17 NOTE — Progress Notes (Signed)
ANTICOAGULATION CONSULT NOTE - Initial Consult  Pharmacy Consult for Lovenox and warfarin Indication: DVT  Allergies  Allergen Reactions  . Nitroglycerin Anxiety  . Tramadol Anaphylaxis and Swelling  . Dilaudid [Hydromorphone Hcl] Other (See Comments)    Hallucination  . Levaquin [Levofloxacin In D5w] Nausea And Vomiting  . Trazodone And Nefazodone   . Sulfa Antibiotics Rash    Patient Measurements: Height: 5\' 6"  (167.6 cm) Weight: 427 lb 0.5 oz (193.7 kg) IBW/kg (Calculated) : 59.3  Vital Signs: Temp: 98 F (36.7 C) (12/17 1352) Temp src: Oral (12/17 1352) BP: 163/90 mmHg (12/17 1352) Pulse Rate: 65 (12/17 1352)  Labs:  Recent Labs  07/15/13 1641  07/16/13 0320 07/16/13 0634 07/16/13 0830 07/16/13 1430  HGB 13.3  --   --  11.7*  --   --   HCT 39.1  --   --  35.8*  --   --   PLT 304  --   --  275  --   --   APTT  --   --   --  47*  --   --   LABPROT  --   --   --  20.5*  --   --   INR  --   --   --  1.82*  --   --   CREATININE 0.77  --   --  1.01  --   --   TROPONINI  --   < > <0.30  --  <0.30 <0.30  < > = values in this interval not displayed.  Estimated Creatinine Clearance: 119 ml/min (by C-G formula based on Cr of 1.01).   Medications:  Scheduled:  . buPROPion  300 mg Oral Daily  . busPIRone  15 mg Oral TID  . cephALEXin  500 mg Oral TID  . escitalopram  20 mg Oral Daily  . feeding supplement (PRO-STAT SUGAR FREE 64)  30 mL Oral TID WC  . hydrochlorothiazide  25 mg Oral QHS    Assessment: 52 YO morbidly obese female being treated for DVT. Currently on Xarelto treatment course now to be transitioned to Lovenox + warfarin. Last Xarelto dose was this morning at 0955. INR yesterday morning was 1.86- this reading is likely affected by Xarelto. Warfarin to start at the time the next Xarelto dose would have been given (tonight at 1800) and Lovenox to start 24 hours after last Xarelto dose (tomorrow morning at 1000). No bleeding noted. Last Hgb 11.7, plts 275.  No bleeding noted.   Goal of Therapy:  INR 2-3 Anti-Xa level 0.6-1 units/ml 4hrs after LMWH dose given Monitor platelets by anticoagulation protocol: Yes   Plan:  1. Warfarin 7.5mg  po x1 tonight 2. Lovenox 195mg  subQ q12h starting 12/18 at 1000 3. Daily PT/INR 4. Q72h CBC 5. Follow for s/s bleeding and discharge planning  Olivia Pavelko D. Ahlam Piscitelli, PharmD, BCPS Clinical Pharmacist Pager: [phone] 07/17/2013 4:08 PM

## 2013-07-18 LAB — CBC
HCT: 36.6 % (ref 36.0–46.0)
MCH: 28.8 pg (ref 26.0–34.0)
MCV: 87.1 fL (ref 78.0–100.0)
Platelets: 259 10*3/uL (ref 150–400)
RBC: 4.2 MIL/uL (ref 3.87–5.11)
WBC: 7.2 10*3/uL (ref 4.0–10.5)

## 2013-07-18 LAB — PROTIME-INR: INR: 1.41 (ref 0.00–1.49)

## 2013-07-18 MED ORDER — ENOXAPARIN SODIUM 150 MG/ML ~~LOC~~ SOLN
195.0000 mg | Freq: Two times a day (BID) | SUBCUTANEOUS | Status: DC
  Administered 2013-07-18: 195 mg via SUBCUTANEOUS
  Filled 2013-07-18 (×2): qty 2

## 2013-07-18 MED ORDER — ENOXAPARIN SODIUM 150 MG/ML ~~LOC~~ SOLN: 195.0000 mg | Freq: Two times a day (BID) | SUBCUTANEOUS | Status: DC

## 2013-07-18 MED ORDER — WARFARIN SODIUM 7.5 MG PO TABS
7.5000 mg | ORAL_TABLET | Freq: Once | ORAL | Status: AC
  Administered 2013-07-18: 7.5 mg via ORAL
  Filled 2013-07-18 (×2): qty 1

## 2013-07-18 MED ORDER — ENOXAPARIN (LOVENOX) PATIENT EDUCATION KIT
PACK | Freq: Once | Status: DC
  Filled 2013-07-18: qty 1

## 2013-07-18 MED ORDER — HYDROCODONE-ACETAMINOPHEN 5-325 MG PO TABS: 1.0000 | ORAL_TABLET | Freq: Four times a day (QID) | ORAL | Status: DC | PRN

## 2013-07-18 MED ORDER — WARFARIN SODIUM 5 MG PO TABS: 7.5000 mg | ORAL_TABLET | Freq: Every evening | ORAL | Status: DC

## 2013-07-18 NOTE — Progress Notes (Signed)
DC tele and IV per MD orders and protocol; prescriptions given to patient; no further questions at this time.  BARNETT, Geroge Baseman

## 2013-07-18 NOTE — ED Provider Notes (Signed)
Medical screening examination/treatment/procedure(s) were conducted as a shared visit with non-physician practitioner(s) and myself.  I personally evaluated the patient during the encounter   .Face to face Exam:  General:  A&Ox3 HEENT:  Atraumatic Resp:  Normal effort Abd:  Nondistended Neuro:No focal deficits     Nelia Shi, MD 07/18/13 1345

## 2013-07-18 NOTE — Discharge Summary (Signed)
Physician Discharge Summary  Stacey [name] ZOX:[id number] DOB: [birth year] DOA: 07/15/2013  PCP: Stacey Croon, MD  Admit date: 07/15/2013 Discharge date: 07/18/2013  Time spent: >30 minutes  Recommendations for Outpatient Follow-up:  Follow-up Information   Follow up with Stacey Croon, MD. (on Monday as scheduled)    Specialty:  Family Medicine   Contact information:   [facility] [place] [address] [phone]       Please follow up. (Dr Lorain Childes to monitor INR over the weekend)       Discharge Diagnoses:  Principal Problem:   Short of breath on exertion Active Problems:   Morbid obesity   HTN (hypertension)   OSA (obstructive sleep apnea)   Cellulitis of leg, right   Pulmonary embolism   Discharge Condition: improved/stable  Diet recommendation: low sodium heart healthy  Filed Weights   07/15/13 1522 07/16/13 0248  Weight: 196.135 kg (432 lb 6.4 oz) 193.7 kg (427 lb 0.5 oz)    History of present illness:  Pt  is a 52 y.o. female with Past medical history of hypertension, depression, diabetes mellitus, morbid obesity, sleep apnea, recently diagnosed DVT, recently diagnosed right leg cellulitis who presented with the complaints of shortness of breath. She mentioned that she has baseline shortness of breath on exertion when she walks around from one room to the other room as well as to the restroom. She uses a cane to walk. She mentions that since last few days she has been feeling more short of breath than his baseline and also had some chest tightness. Currently she mentions her symptoms are getting improved. She complain of some dizziness. She denies any focal neurological deficit or orthopnea or PND.  She also denies any fever she has a dry cough. She denies any diarrhea or fixation or active bleeding or burning urination or nausea or vomiting.  She reported compliance with all her medications, she was admitted for further eval and  management.   Hospital Course:  . Probable PE,  -Pt had CT scan of the chest which is suggestive of possible pulmonary embolism and had recently been diagnosed with DVT and worsening SOB on presentation, and she admits to having sob when DVT was diagnosed although she was not worked up for PE then.  -CEs are neg  -I discussed pt with Heme/Dr Granfortuna following her admission and he stated that the efficacy of xarelto/NOAC has not been proven in morbidly obese pts >300lbs and he would recommend for this morbidly obese pt to treat this as a xarelto failure and switched pt to coumadin/lovenox -this was Discussed this with pt and she is agreeable will plan  Echo with no RV strain reported EF 55-60%. -Pt was placed on lovenox, coumadin and PT/INR was monitored and was still subtherapeutic on follow up and she was to continue coumadin and follw for 5day overlap and INR s were to be monitored by Dr Lorain Childes initially(over the weekend) and she was to see her PCP on Monday for continued monitoring and for the lovenox to be dc'ed after completing the overlap when INR therapeutic  2.Hypertension  Continue home antihypertensive, blood pressure stable -she is to folow up with PCP 3.Sleep apnea  Continue CPAP at 9 cm pressure   Consultants:  Heme phone consult Procedures:  echo Study Conclusions  - Procedure narrative: Transthoracic echocardiography. Image quality was adequate. The study was technically difficult, as a result of poor acoustic windows and body habitus. - Left ventricle: The cavity size was at the  upper limits of normal. Wall thickness was increased in a pattern of mild LVH. There was mild concentric hypertrophy. Systolic function was normal. The estimated ejection fraction was in the range of 55% to 60%. Although no diagnostic regional wall motion abnormality was identified, this possibility cannot be completely excluded on the basis of this study. Findings consistent with left  ventricular diastolic dysfunction. There was no evidence of elevated ventricular filling pressure by Doppler parameters. - Atrial septum: No defect or patent foramen ovale was identified.   Discharge Exam: Filed Vitals:   07/18/13 1345  BP: 157/81  Pulse: 74  Temp: 98.5 F (36.9 C)  Resp: 18   xam:  General: Morbidly obese, alert & oriented x 3 In NAD  Cardiovascular: RRR, nl S1 s2  Respiratory: decreased BS at bases, clear  Abdomen: soft +BS NT/ND, no masses palpable  Extremities: No cyanosis and no edema   Discharge Instructions  Discharge Orders   Future Orders Complete By Expires   Diet - low sodium heart healthy  As directed    Discharge instructions  As directed    Comments:     HH RN to check lab PT/INR daily  And call results to Dr Lorain Childes, goal 2-3, and then lovenox to be stopped as directed per MD   Increase activity slowly  As directed        Medication List    STOP taking these medications       Rivaroxaban 15 MG Tabs tablet  Commonly known as:  XARELTO      TAKE these medications       buPROPion 300 MG 24 hr tablet  Commonly known as:  WELLBUTRIN XL  Take 300 mg by mouth daily.     busPIRone 15 MG tablet  Commonly known as:  BUSPAR  Take 15 mg by mouth 3 (three) times daily.     cephALEXin 500 MG capsule  Commonly known as:  KEFLEX  Take 500 mg by mouth 3 (three) times daily.     clotrimazole-betamethasone cream  Commonly known as:  LOTRISONE  Apply 1 application topically 2 (two) times daily.     enoxaparin 150 MG/ML injection  Commonly known as:  LOVENOX  Inject 1.3 mLs (195 mg total) into the skin every 12 (twelve) hours.     escitalopram 20 MG tablet  Commonly known as:  LEXAPRO  Take 20 mg by mouth daily.     hydrochlorothiazide 25 MG tablet  Commonly known as:  HYDRODIURIL  Take 25 mg by mouth daily.     HYDROcodone-acetaminophen 5-325 MG per tablet  Commonly known as:  NORCO/VICODIN  Take 1 tablet by mouth every 6 (six)  hours as needed for moderate pain.     loperamide 2 MG capsule  Commonly known as:  IMODIUM  Take 2 mg by mouth as needed for diarrhea or loose stools.     LORazepam 1 MG tablet  Commonly known as:  ATIVAN  Take 1 mg by mouth 4 (four) times daily as needed for anxiety.     warfarin 5 MG tablet  Commonly known as:  COUMADIN  Take 1.5 tablets (7.5 mg total) by mouth every evening.       Allergies  Allergen Reactions  . Nitroglycerin Anxiety  . Tramadol Anaphylaxis and Swelling  . Dilaudid [Hydromorphone Hcl] Other (See Comments)    Hallucination  . Levaquin [Levofloxacin In D5w] Nausea And Vomiting  . Trazodone And Nefazodone   . Sulfa Antibiotics Rash  Follow-up Information   Follow up with Stacey Croon, MD. (on Monday as scheduled)    Specialty:  Family Medicine   Contact information:   [facility] [place] [address] [phone]       Please follow up. (Dr Lorain Childes to monitor INR over the weekend)        The results of significant diagnostics from this hospitalization (including imaging, microbiology, ancillary and laboratory) are listed below for reference.    Significant Diagnostic Studies: Dg Chest 2 View (if Patient Has Fever And/or Copd)  07/15/2013   CLINICAL DATA:  Shortness of breath and cough  EXAM: CHEST  2 VIEW  COMPARISON:  February 17, 2013  FINDINGS: Lungs are clear. Heart is mildly enlarged with normal pulmonary vascularity. No adenopathy. There is degenerative change in the lower thoracic spine.  IMPRESSION: Heart mildly enlarged.  No edema or consolidation.   Electronically Signed   By: Bretta Bang M.D.   On: 07/15/2013 16:18   Ct Angio Chest Pe W/cm &/or Wo Cm  07/16/2013   CLINICAL DATA:  DVT right leg, on Xarelto, shortness of breath, chest pain, question pulmonary embolism, history hypertension, diabetes, asthma  EXAM: CT ANGIOGRAPHY CHEST WITH CONTRAST  TECHNIQUE: Multidetector CT imaging of the chest was performed using the  standard protocol during bolus administration of intravenous contrast. Multiplanar CT image reconstructions including MIPs were obtained to evaluate the vascular anatomy.  CONTRAST:  OMNIPAQUE IOHEXOL 350 MG/ML SOLN  COMPARISON:  None  FINDINGS: Aorta normal caliber without aneurysm or dissection.  Degradation of image quality secondary to body habitus.  Questionable filling defect in the right lower lobe pulmonary artery image 151 series 5.  Remaining pulmonary arteries appear grossly patent.  Unable to exclude small peripheral emboli on this exam particularly in lower lobes.  No thoracic adenopathy.  Visualized portion of upper abdomen unremarkable.  Lungs clear.  No infiltrate, pleural effusion or pneumothorax.  Scattered degenerative disc disease changes thoracic spine.  No acute osseous findings.  Review of the MIP images confirms the above findings.  IMPRESSION: Single questionable pulmonary embolus in aright lower lobe pulmonary artery.  Remainder of exam unremarkable.  Critical Value/emergent results were called by telephone at the time of interpretation on 07/16/2013 at 12:38 AM to [facility] , who verbally acknowledged these results.   Electronically Signed   By: Ulyses Southward M.D.   On: 07/16/2013 00:38    Microbiology: No results found for this or any previous visit (from the past 240 hour(s)).   Labs: Basic Metabolic Panel:  Recent Labs Lab 07/15/13 1641 07/16/13 0634  NA 138 139  K 3.9 4.0  CL 101 103  CO2 22 26  GLUCOSE 99 127*  BUN 13 13  CREATININE 0.77 1.01  CALCIUM 10.3 9.7   Liver Function Tests:  Recent Labs Lab 07/16/13 0634  AST 19  ALT 19  ALKPHOS 66  BILITOT 0.6  PROT 6.9  ALBUMIN 3.6   No results found for this basename: LIPASE, AMYLASE,  in the last 168 hours No results found for this basename: AMMONIA,  in the last 168 hours CBC:  Recent Labs Lab 07/15/13 1641 07/16/13 0634 07/18/13 0642  WBC 9.5 11.2* 7.2  NEUTROABS  --  7.2  --   HGB  13.3 11.7* 12.1  HCT 39.1 35.8* 36.6  MCV 85.7 86.7 87.1  PLT 304 275 259   Cardiac Enzymes:  Recent Labs Lab 07/15/13 2138 07/16/13 0320 07/16/13 0830 07/16/13 1430  TROPONINI <0.30 <  0.30 <0.30 <0.30   BNP: BNP (last 3 results)  Recent Labs  07/15/13 2138  PROBNP 49.5   CBG:  Recent Labs Lab 07/15/13 1649 07/16/13 0611 07/16/13 1613  GLUCAP 94 112* 105*       Signed:  Lalitha Ilyas C  [facility] 07/18/2013, 5:19 PM

## 2013-07-18 NOTE — Progress Notes (Signed)
Pt c/o dizziness since admission; pt states she told ED MD and Dr. Suanne Marker; pt states this issue has never been addressed; pt hesitant to d/c home tonight due to dizziness; MD paged to make aware; will await callback.

## 2013-07-18 NOTE — Progress Notes (Signed)
No callback from MD; MD paged again; pt still hesitant to d/c home due to dizziness; will await callback.

## 2013-07-18 NOTE — Progress Notes (Signed)
Patient request for a new MD; MD notified of patients request; patient is extremely agitated with current situation; will follow up with MD, charge nurse and patient.  [name], Stacey Baseman

## 2013-07-18 NOTE — Progress Notes (Signed)
Per MD patient is medically ready for discharge; all tests completed were negative; possible dizziness related to PE and pain medicine per MD reasoning; no need for further test or continued hospitalization; Patient advised to communicate with primary physician regarding dizziness per MD orders.  [name], Stacey Baseman

## 2013-07-18 NOTE — Progress Notes (Signed)
Chaplain followed up with pt after overnight chaplain referral. Pt was much calmer today and much more positive. She said she "understands that MDs are serving her best interests." She still wants to go home and is very anxious about blood clot. Said she is "finally feeling God again and he is helping me accept that this is just what is right now." Chaplain provided emotional and spiritual support, caring presence, empathic listening, and prayer.   Maurene Capes, [place] [phone]

## 2013-07-19 ENCOUNTER — Emergency Department (HOSPITAL_COMMUNITY): Payer: Medicaid Other

## 2013-07-19 ENCOUNTER — Emergency Department (HOSPITAL_COMMUNITY)
Admission: EM | Admit: 2013-07-19 | Discharge: 2013-07-19 | Disposition: A | Discharge status: Home / Self Care | Payer: Medicaid Other | Attending: Emergency Medicine | Admitting: Emergency Medicine

## 2013-07-19 ENCOUNTER — Encounter (HOSPITAL_COMMUNITY): Payer: Self-pay | Admitting: Emergency Medicine

## 2013-07-19 DIAGNOSIS — Z86711 Personal history of pulmonary embolism: Secondary | ICD-10-CM | POA: Insufficient documentation

## 2013-07-19 DIAGNOSIS — F329 Major depressive disorder, single episode, unspecified: Secondary | ICD-10-CM | POA: Insufficient documentation

## 2013-07-19 DIAGNOSIS — Z8619 Personal history of other infectious and parasitic diseases: Secondary | ICD-10-CM | POA: Insufficient documentation

## 2013-07-19 DIAGNOSIS — Z885 Allergy status to narcotic agent status: Secondary | ICD-10-CM | POA: Insufficient documentation

## 2013-07-19 DIAGNOSIS — Z7901 Long term (current) use of anticoagulants: Secondary | ICD-10-CM | POA: Insufficient documentation

## 2013-07-19 DIAGNOSIS — Z8719 Personal history of other diseases of the digestive system: Secondary | ICD-10-CM | POA: Insufficient documentation

## 2013-07-19 DIAGNOSIS — I10 Essential (primary) hypertension: Secondary | ICD-10-CM | POA: Insufficient documentation

## 2013-07-19 DIAGNOSIS — R609 Edema, unspecified: Secondary | ICD-10-CM | POA: Insufficient documentation

## 2013-07-19 DIAGNOSIS — Z888 Allergy status to other drugs, medicaments and biological substances status: Secondary | ICD-10-CM | POA: Insufficient documentation

## 2013-07-19 DIAGNOSIS — E119 Type 2 diabetes mellitus without complications: Secondary | ICD-10-CM | POA: Insufficient documentation

## 2013-07-19 DIAGNOSIS — Z86718 Personal history of other venous thrombosis and embolism: Secondary | ICD-10-CM | POA: Insufficient documentation

## 2013-07-19 DIAGNOSIS — Z882 Allergy status to sulfonamides status: Secondary | ICD-10-CM | POA: Insufficient documentation

## 2013-07-19 DIAGNOSIS — F3289 Other specified depressive episodes: Secondary | ICD-10-CM | POA: Insufficient documentation

## 2013-07-19 DIAGNOSIS — G4733 Obstructive sleep apnea (adult) (pediatric): Secondary | ICD-10-CM | POA: Insufficient documentation

## 2013-07-19 DIAGNOSIS — Z881 Allergy status to other antibiotic agents status: Secondary | ICD-10-CM | POA: Insufficient documentation

## 2013-07-19 DIAGNOSIS — R229 Localized swelling, mass and lump, unspecified: Secondary | ICD-10-CM | POA: Insufficient documentation

## 2013-07-19 DIAGNOSIS — J069 Acute upper respiratory infection, unspecified: Secondary | ICD-10-CM

## 2013-07-19 DIAGNOSIS — Z79899 Other long term (current) drug therapy: Secondary | ICD-10-CM | POA: Insufficient documentation

## 2013-07-19 DIAGNOSIS — J45909 Unspecified asthma, uncomplicated: Secondary | ICD-10-CM | POA: Insufficient documentation

## 2013-07-19 DIAGNOSIS — M129 Arthropathy, unspecified: Secondary | ICD-10-CM | POA: Insufficient documentation

## 2013-07-19 MED ORDER — ALBUTEROL SULFATE HFA 108 (90 BASE) MCG/ACT IN AERS: 2.0000 | INHALATION_SPRAY | RESPIRATORY_TRACT | Status: DC | PRN

## 2013-07-19 NOTE — ED Notes (Signed)
Updated patient and patient family on plan of care

## 2013-07-19 NOTE — ED Provider Notes (Signed)
CSN: [id number]     Arrival date & time 07/19/13  1745 History   First MD Initiated Contact with Patient 07/19/13 2043     Chief Complaint  Patient presents with  . Fever   HPI  52 y/o female with history as noted below who presents with cc of fever. Was discharged from the hospital yesterday after being admitted for a pulmonary embolism. Discharged with coumadin and Lovenox which she has been taking. She states that since going home from the hospital she developed a fever of 100.3. She has had associated cough, congestion and sore throat. She is currently on keflex for RLE cellulitis that she states is improving.   Past Medical History  Diagnosis Date  . Hypertension   . Depression   . Type 2 diabetes mellitus   . Diverticulitis   . Asthma   . Cyst     pt has "cyst" to legs for years, areas drain at time  . Pancreatitis   . Arthritis   . C. difficile colitis 11/08/2011  . Musculoskeletal pain 11/08/2011  . Neuropathic pain 11/10/2011  . Sleep apnea with use of continuous positive airway pressure (CPAP)     uses CPAP at night  . 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 DVT (deep venous thrombosis)    Past Surgical History  Procedure Laterality Date  . Tonsillectomy    . Cholecystectomy    . Cesarean section    . Nose surgery     History reviewed. No pertinent family history. History  Substance Use Topics  . Smoking status: Never Smoker   . Smokeless tobacco: Never Used  . Alcohol Use: No   OB History   Grav Para Term Preterm Abortions TAB SAB Ect Mult Living                 Review of Systems  Constitutional: Positive for fever. Negative for chills.  HENT: Positive for congestion.   Respiratory: Positive for cough.   Cardiovascular: Negative for chest pain.  Gastrointestinal: Negative for nausea, vomiting and abdominal pain.  Genitourinary: Negative for dysuria and frequency.  Skin: Negative for rash.  All other systems reviewed and are  negative.    Allergies  Nitroglycerin; Tramadol; Dilaudid; Levaquin; Trazodone and nefazodone; and Sulfa antibiotics  Home Medications   Current Outpatient Rx  Name  Route  Sig  Dispense  Refill  . acetaminophen (TYLENOL) 500 MG tablet   Oral   Take 500 mg by mouth every 6 (six) hours as needed for mild pain.         Marland Kitchen buPROPion (WELLBUTRIN XL) 300 MG 24 hr tablet   Oral   Take 300 mg by mouth daily.          . busPIRone (BUSPAR) 15 MG tablet   Oral   Take 15 mg by mouth 3 (three) times daily.          . cephALEXin (KEFLEX) 500 MG capsule   Oral   Take 500 mg by mouth 3 (three) times daily. Patient states she is on her last dose as of today 07-19-13         . clotrimazole-betamethasone (LOTRISONE) cream   Topical   Apply 1 application topically 2 (two) times daily.         Marland Kitchen enoxaparin (LOVENOX) 150 MG/ML injection   Subcutaneous   Inject 1.3 mLs (195 mg total) into the skin every 12 (twelve) hours.   14 Syringe  0   . escitalopram (LEXAPRO) 20 MG tablet   Oral   Take 20 mg by mouth daily.           . hydrochlorothiazide (HYDRODIURIL) 25 MG tablet   Oral   Take 25 mg by mouth at bedtime.          Marland Kitchen loperamide (IMODIUM) 2 MG capsule   Oral   Take 2 mg by mouth as needed for diarrhea or loose stools.          Marland Kitchen LORazepam (ATIVAN) 1 MG tablet   Oral   Take 1 mg by mouth 4 (four) times daily as needed for anxiety.          Marland Kitchen oxyCODONE-acetaminophen (PERCOCET/ROXICET) 5-325 MG per tablet   Oral   Take 1 tablet by mouth every 4 (four) hours as needed for moderate pain or severe pain.         Marland Kitchen warfarin (COUMADIN) 10 MG tablet   Oral   Take 10 mg by mouth daily.         Marland Kitchen albuterol (PROVENTIL HFA;VENTOLIN HFA) 108 (90 BASE) MCG/ACT inhaler   Inhalation   Inhale 2 puffs into the lungs every 4 (four) hours as needed for wheezing or shortness of breath.   1 Inhaler   0    BP 160/69  Pulse 76  Temp(Src) 99.1 F (37.3 C) (Oral)  Resp  18  SpO2 98%  LMP 06/26/2013 Physical Exam  Nursing note and vitals reviewed. Constitutional: She is oriented to person, place, and time. No distress.  Morbidly obese  HENT:  Head: Normocephalic and atraumatic.  Eyes: Conjunctivae are normal. Pupils are equal, round, and reactive to light.  Neck: Normal range of motion. Neck supple.  Cardiovascular: Normal rate and regular rhythm.  Exam reveals no gallop and no friction rub.   No murmur heard. Pulmonary/Chest: Effort normal and breath sounds normal.  Abdominal: Soft. She exhibits no distension. There is no tenderness.  Musculoskeletal: Normal range of motion. She exhibits edema (Bilateral lower extremity edema). She exhibits no tenderness.  Neurological: She is alert and oriented to person, place, and time. She has normal strength and normal reflexes. No cranial nerve deficit or sensory deficit.  Skin: Skin is warm and dry. No rash noted.  Soft tissue mass on right thigh. Large with mild associated erythema. No warmth, drainage,  or tenderness.   Psychiatric: She has a normal mood and affect.    ED Course  Procedures (including critical care time) Labs Review Labs Reviewed - No data to display Imaging Review Dg Chest 2 View  07/19/2013   CLINICAL DATA:  52 year old female with fever  EXAM: CHEST  2 VIEW  COMPARISON:  07/15/2013 and prior chest radiographs  FINDINGS: Cardiomegaly again identified.  Mild peribronchial thickening is stable.  There is no evidence of focal airspace disease, pulmonary edema, suspicious pulmonary nodule/mass, pleural effusion, or pneumothorax. No acute bony abnormalities are identified.  IMPRESSION: Cardiomegaly without evidence of acute cardiopulmonary disease.   Electronically Signed   By: Laveda Abbe M.D.   On: 07/19/2013 23:07    EKG Interpretation   None       MDM   1. Viral upper respiratory infection     1 day of fever with several days of cough. No CP or SOB. No hypoxia or tachypnea. HDS.  Cellulitis is improving per the patient. CXR not c/w Pneumonia. Likely viral URI as source. The patient was discharged home with an inhaler. Return precautions  given and discussed with the patient who was in agreement with the plan.     Shanon Ace, MD 07/20/13 [phone]

## 2013-07-19 NOTE — ED Notes (Signed)
MD at bedside. 

## 2013-07-19 NOTE — ED Notes (Signed)
Pt reports that she was just dc'ed home yesterday after having a PE and a DVT. Reports that she developed a fever of 100.3 today. States that she called and was told to come back here for follow up. Denies any other symptoms.

## 2013-07-24 ENCOUNTER — Emergency Department (HOSPITAL_COMMUNITY)
Admission: EM | Admit: 2013-07-24 | Discharge: 2013-07-24 | Disposition: A | Discharge status: Home / Self Care | Payer: Medicaid Other | Source: Home / Self Care | Attending: Emergency Medicine | Admitting: Emergency Medicine

## 2013-07-24 ENCOUNTER — Emergency Department (HOSPITAL_COMMUNITY)
Admission: EM | Admit: 2013-07-24 | Discharge: 2013-07-25 | Disposition: A | Discharge status: Home / Self Care | Payer: Medicaid Other | Attending: Emergency Medicine | Admitting: Emergency Medicine

## 2013-07-24 ENCOUNTER — Encounter (HOSPITAL_COMMUNITY): Payer: Self-pay | Admitting: Emergency Medicine

## 2013-07-24 DIAGNOSIS — Z7901 Long term (current) use of anticoagulants: Secondary | ICD-10-CM | POA: Insufficient documentation

## 2013-07-24 DIAGNOSIS — Z86718 Personal history of other venous thrombosis and embolism: Secondary | ICD-10-CM | POA: Insufficient documentation

## 2013-07-24 DIAGNOSIS — Z8719 Personal history of other diseases of the digestive system: Secondary | ICD-10-CM | POA: Insufficient documentation

## 2013-07-24 DIAGNOSIS — Z79899 Other long term (current) drug therapy: Secondary | ICD-10-CM | POA: Insufficient documentation

## 2013-07-24 DIAGNOSIS — Z8619 Personal history of other infectious and parasitic diseases: Secondary | ICD-10-CM | POA: Insufficient documentation

## 2013-07-24 DIAGNOSIS — Z872 Personal history of diseases of the skin and subcutaneous tissue: Secondary | ICD-10-CM | POA: Insufficient documentation

## 2013-07-24 DIAGNOSIS — F329 Major depressive disorder, single episode, unspecified: Secondary | ICD-10-CM | POA: Insufficient documentation

## 2013-07-24 DIAGNOSIS — R05 Cough: Secondary | ICD-10-CM | POA: Insufficient documentation

## 2013-07-24 DIAGNOSIS — G4733 Obstructive sleep apnea (adult) (pediatric): Secondary | ICD-10-CM | POA: Insufficient documentation

## 2013-07-24 DIAGNOSIS — R059 Cough, unspecified: Secondary | ICD-10-CM | POA: Insufficient documentation

## 2013-07-24 DIAGNOSIS — J45901 Unspecified asthma with (acute) exacerbation: Secondary | ICD-10-CM | POA: Insufficient documentation

## 2013-07-24 DIAGNOSIS — E119 Type 2 diabetes mellitus without complications: Secondary | ICD-10-CM | POA: Insufficient documentation

## 2013-07-24 DIAGNOSIS — Z792 Long term (current) use of antibiotics: Secondary | ICD-10-CM | POA: Insufficient documentation

## 2013-07-24 DIAGNOSIS — M129 Arthropathy, unspecified: Secondary | ICD-10-CM | POA: Insufficient documentation

## 2013-07-24 DIAGNOSIS — F3289 Other specified depressive episodes: Secondary | ICD-10-CM | POA: Insufficient documentation

## 2013-07-24 DIAGNOSIS — E876 Hypokalemia: Secondary | ICD-10-CM | POA: Insufficient documentation

## 2013-07-24 DIAGNOSIS — R042 Hemoptysis: Secondary | ICD-10-CM | POA: Insufficient documentation

## 2013-07-24 DIAGNOSIS — I10 Essential (primary) hypertension: Secondary | ICD-10-CM | POA: Insufficient documentation

## 2013-07-24 DIAGNOSIS — Z09 Encounter for follow-up examination after completed treatment for conditions other than malignant neoplasm: Secondary | ICD-10-CM | POA: Insufficient documentation

## 2013-07-24 DIAGNOSIS — Z9089 Acquired absence of other organs: Secondary | ICD-10-CM | POA: Insufficient documentation

## 2013-07-24 DIAGNOSIS — G4730 Sleep apnea, unspecified: Secondary | ICD-10-CM | POA: Insufficient documentation

## 2013-07-24 LAB — PROTIME-INR: Prothrombin Time: 23.8 seconds — ABNORMAL HIGH (ref 11.6–15.2)

## 2013-07-24 NOTE — ED Notes (Signed)
Pt was seen here earlier today for coumadin level which was normal.  Pt st's after getting home she coughed and saw blood in it.

## 2013-07-24 NOTE — ED Notes (Signed)
Pt stated coughing out blood, stated small amount, stated is brownish red in color.

## 2013-07-24 NOTE — ED Provider Notes (Signed)
CSN: [id number]     Arrival date & time 07/24/13  1619 History  This chart was scribed for non-physician practitioner Dierdre Forth, PA-C, working with Shelda Jakes, MD by Leone Payor, ED Scribe. This patient was seen in room TR10C/TR10C and the patient's care was started at 1619.      Chief Complaint  Patient presents with  . Follow-up    The history is provided by the patient and medical records. No language interpreter was used.    HPI Comments: Stacey [name] is a 52 y.o. female who presents to the Emergency Department for follow up on her coumadin level. Pt states she was recently admitted to the hospital and was started on coumadin and lovenox for a DVT and PE. Pt states her PCP is not working the holidays so she was instructed to come to the ED to have her Coumadin levels evaluated. She states her last INR was 1.7.  She denies any other complaints at this time. She reports her last dose of Lovenox is to night and she is concerned she may need a refill on her Lovenox.  She reports persistent shortness of breath but no worse than when she was discharged and no other symptoms.  Past Medical History  Diagnosis Date  . Hypertension   . Depression   . Type 2 diabetes mellitus   . Diverticulitis   . Asthma   . Cyst     pt has "cyst" to legs for years, areas drain at time  . Pancreatitis   . Arthritis   . C. difficile colitis 11/08/2011  . Musculoskeletal pain 11/08/2011  . Neuropathic pain 11/10/2011  . Sleep apnea with use of continuous positive airway pressure (CPAP)     uses CPAP at night  . 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 DVT (deep venous thrombosis)    Past Surgical History  Procedure Laterality Date  . Tonsillectomy    . Cholecystectomy    . Cesarean section    . Nose surgery     History reviewed. No pertinent family history. History  Substance Use Topics  . Smoking status: Never Smoker   . Smokeless tobacco: Never Used   . Alcohol Use: No   OB History   Grav Para Term Preterm Abortions TAB SAB Ect Mult Living                 Review of Systems  Constitutional: Negative for fever, diaphoresis, appetite change, fatigue and unexpected weight change.  HENT: Negative for mouth sores.   Eyes: Negative for visual disturbance.  Respiratory: Positive for shortness of breath. Negative for cough, chest tightness and wheezing.   Cardiovascular: Negative for chest pain.  Gastrointestinal: Negative for nausea, vomiting, abdominal pain, diarrhea and constipation.  Endocrine: Negative for polydipsia, polyphagia and polyuria.  Genitourinary: Negative for dysuria, urgency, frequency and hematuria.  Musculoskeletal: Negative for back pain and neck stiffness.  Skin: Negative for rash and wound.  Allergic/Immunologic: Negative for immunocompromised state.  Neurological: Negative for syncope, light-headedness and headaches.  Hematological: Does not bruise/bleed easily.  Psychiatric/Behavioral: Negative for sleep disturbance. The patient is not nervous/anxious.     Allergies  Nitroglycerin; Tramadol; Dilaudid; Levaquin; Trazodone and nefazodone; and Sulfa antibiotics  Home Medications   Current Outpatient Rx  Name  Route  Sig  Dispense  Refill  . acetaminophen (TYLENOL) 500 MG tablet   Oral   Take 500 mg by mouth every 6 (six) hours as needed for  mild pain.         Marland Kitchen albuterol (PROVENTIL HFA;VENTOLIN HFA) 108 (90 BASE) MCG/ACT inhaler   Inhalation   Inhale 2 puffs into the lungs every 4 (four) hours as needed for wheezing or shortness of breath.   1 Inhaler   0   . buPROPion (WELLBUTRIN XL) 300 MG 24 hr tablet   Oral   Take 300 mg by mouth daily.          . busPIRone (BUSPAR) 15 MG tablet   Oral   Take 15 mg by mouth 3 (three) times daily.          . cephALEXin (KEFLEX) 500 MG capsule   Oral   Take 500 mg by mouth 3 (three) times daily. Patient states she is on her last dose as of today 07-19-13          . enoxaparin (LOVENOX) 150 MG/ML injection   Subcutaneous   Inject 1.3 mLs (195 mg total) into the skin every 12 (twelve) hours.   14 Syringe   0   . escitalopram (LEXAPRO) 20 MG tablet   Oral   Take 20 mg by mouth daily.           . hydrochlorothiazide (HYDRODIURIL) 25 MG tablet   Oral   Take 25 mg by mouth at bedtime.          Marland Kitchen loperamide (IMODIUM) 2 MG capsule   Oral   Take 2 mg by mouth as needed for diarrhea or loose stools.          Marland Kitchen LORazepam (ATIVAN) 1 MG tablet   Oral   Take 1 mg by mouth 4 (four) times daily as needed for anxiety.          Marland Kitchen oxyCODONE-acetaminophen (PERCOCET/ROXICET) 5-325 MG per tablet   Oral   Take 1 tablet by mouth every 4 (four) hours as needed for moderate pain or severe pain.         Marland Kitchen warfarin (COUMADIN) 10 MG tablet   Oral   Take 10 mg by mouth daily.          BP 135/75  Pulse 68  Temp(Src) 98.2 F (36.8 C) (Oral)  Resp 20  Ht 5\' 6"  (1.676 m)  Wt 432 lb (195.954 kg)  BMI 69.76 kg/m2  SpO2 98%  LMP 06/26/2013 Physical Exam  Nursing note and vitals reviewed. Constitutional: She is oriented to person, place, and time. She appears well-developed and well-nourished. No distress.  Awake, alert, Obese  HENT:  Head: Normocephalic and atraumatic.  Mouth/Throat: Oropharynx is clear and moist. No oropharyngeal exudate.  Eyes: Conjunctivae are normal. No scleral icterus.  Neck: Normal range of motion. Neck supple.  Cardiovascular: Normal rate, regular rhythm, normal heart sounds and intact distal pulses.   No murmur heard. Pulmonary/Chest: Effort normal and breath sounds normal. No accessory muscle usage. Not tachypneic. No respiratory distress. She has no decreased breath sounds. She has no wheezes. She has no rhonchi. She has no rales.  Musculoskeletal: Normal range of motion. She exhibits no edema.  Neurological: She is alert and oriented to person, place, and time.  Speech is clear and goal oriented Moves  extremities without ataxia  Skin: Skin is warm and dry. She is not diaphoretic.  Psychiatric: She has a normal mood and affect.    ED Course  Procedures   DIAGNOSTIC STUDIES: Oxygen Saturation is 98% on RA, normal by my interpretation.    COORDINATION OF CARE: 6:26 PM Discussed  treatment plan with pt at bedside and pt agreed to plan.   Labs Review Labs Reviewed  APTT - Abnormal; Notable for the following:    aPTT 67 (*)    All other components within normal limits  PROTIME-INR - Abnormal; Notable for the following:    Prothrombin Time 23.8 (*)    INR 2.21 (*)    All other components within normal limits   Imaging Review No results found.  EKG Interpretation   None       MDM   1. Long term (current) use of anticoagulants     Enrique Sack presents for a Coumadin level checked.  Record review shows the patient was diagnosed with a PE on 07/15/2013 and discharged from the hospital on 07/18/2013. She reports she was discharged with Lovenox shots which will run out tonight.  Patient endorses persistent shortness of breath but has no evidence of respiratory distress or accessory muscle use at this time. Patient without hypoxia.  She reports it is unchanged from her recent diagnosis of PE.  Patient's INR is 2.2 today.  I instructed her to take her last Lovenox shot tonight as directed and then to continue on her Coumadin. She will followup with her primary care physician next week.    It has been determined that no acute conditions requiring further emergency intervention are present at this time. The patient/guardian have been advised of the diagnosis and plan. We have discussed signs and symptoms that warrant return to the ED, such as changes or worsening in symptoms.   Vital signs are stable at discharge.   BP 135/75  Pulse 68  Temp(Src) 98.2 F (36.8 C) (Oral)  Resp 20  Ht 5\' 6"  (1.676 m)  Wt 432 lb (195.954 kg)  BMI 69.76 kg/m2  SpO2 98%  LMP  06/26/2013  Patient/guardian has voiced understanding and agreed to follow-up with the PCP or specialist.      Dierdre Forth, PA-C 07/24/13 1938

## 2013-07-24 NOTE — ED Notes (Signed)
Pt here to check coumadin level. No other complaints

## 2013-07-24 NOTE — ED Notes (Signed)
Reports being here to have coumadin level drawn. Was recently admitted in the hospital and started on coumadin and lovenox for blood clot, pcp is not working during the holidays so they told her to come to ed for eval of coumadin level. No other complaints and no distress noted at this time.

## 2013-07-25 ENCOUNTER — Emergency Department (HOSPITAL_COMMUNITY): Payer: Medicaid Other

## 2013-07-25 LAB — BASIC METABOLIC PANEL
BUN: 20 mg/dL (ref 6–23)
Calcium: 9.4 mg/dL (ref 8.4–10.5)
GFR calc non Af Amer: 65 mL/min — ABNORMAL LOW (ref >90)
Glucose, Bld: 129 mg/dL — ABNORMAL HIGH (ref 70–99)
Sodium: 138 mEq/L (ref 135–145)

## 2013-07-25 LAB — CBC
HCT: 37.4 % (ref 36.0–46.0)
Hemoglobin: 12.9 g/dL (ref 12.0–15.0)
MCH: 28.9 pg (ref 26.0–34.0)
MCHC: 34.5 g/dL (ref 30.0–36.0)
MCV: 83.9 fL (ref 78.0–100.0)

## 2013-07-25 MED ORDER — ACETAMINOPHEN 325 MG PO TABS
650.0000 mg | ORAL_TABLET | Freq: Once | ORAL | Status: AC
  Administered 2013-07-25: 650 mg via ORAL
  Filled 2013-07-25: qty 2

## 2013-07-25 MED ORDER — OXYCODONE-ACETAMINOPHEN 5-325 MG PO TABS
2.0000 | ORAL_TABLET | Freq: Once | ORAL | Status: AC
  Administered 2013-07-25: 2 via ORAL
  Filled 2013-07-25: qty 2

## 2013-07-25 MED ORDER — POTASSIUM CHLORIDE CRYS ER 20 MEQ PO TBCR
40.0000 meq | EXTENDED_RELEASE_TABLET | Freq: Once | ORAL | Status: AC
  Administered 2013-07-25: 40 meq via ORAL
  Filled 2013-07-25: qty 2

## 2013-07-25 NOTE — ED Provider Notes (Signed)
CSN: [id number]     Arrival date & time 07/24/13  2022 History   First MD Initiated Contact with Patient 07/25/13 0002     Chief Complaint  Patient presents with  . Hemoptysis   (Consider location/radiation/quality/duration/timing/severity/associated sxs/prior Treatment) HPI Patient is a 52 yo woman who was diagnosed with pulmonary embolus earlier this month and started on Lovenox and Coumadin. She had an IN R check earlier in the afternoon and her INR was 2.2. She presents because she has noticed blood tinged sputum a couple of times this evening. She has had a mildly productive cough and URI Sx for the past several days. She denies chest pain. She has SOB at baseline with DOE, since her PE, and says this is unchanged. She came to the ED solely out of concern for hemoptysis. She has not passed any clots of blood. No bleeding from any other region.   Past Medical History  Diagnosis Date  . Hypertension   . Depression   . Type 2 diabetes mellitus   . Diverticulitis   . Asthma   . Cyst     pt has "cyst" to legs for years, areas drain at time  . Pancreatitis   . Arthritis   . C. difficile colitis 11/08/2011  . Musculoskeletal pain 11/08/2011  . Neuropathic pain 11/10/2011  . Sleep apnea with use of continuous positive airway pressure (CPAP)     uses CPAP at night  . 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 DVT (deep venous thrombosis)    Past Surgical History  Procedure Laterality Date  . Tonsillectomy    . Cholecystectomy    . Cesarean section    . Nose surgery     No family history on file. History  Substance Use Topics  . Smoking status: Never Smoker   . Smokeless tobacco: Never Used  . Alcohol Use: No   OB History   Grav Para Term Preterm Abortions TAB SAB Ect Mult Living                 Review of Systems  10 point ROS performed and is negative with the exception of sx noted above Allergies  Nitroglycerin; Tramadol; Dilaudid; Levaquin;  Trazodone and nefazodone; and Sulfa antibiotics  Home Medications   Current Outpatient Rx  Name  Route  Sig  Dispense  Refill  . acetaminophen (TYLENOL) 500 MG tablet   Oral   Take 500 mg by mouth every 6 (six) hours as needed for mild pain.         Marland Kitchen albuterol (PROVENTIL HFA;VENTOLIN HFA) 108 (90 BASE) MCG/ACT inhaler   Inhalation   Inhale 2 puffs into the lungs every 4 (four) hours as needed for wheezing or shortness of breath.   1 Inhaler   0   . buPROPion (WELLBUTRIN XL) 300 MG 24 hr tablet   Oral   Take 300 mg by mouth daily.          . busPIRone (BUSPAR) 15 MG tablet   Oral   Take 15 mg by mouth 3 (three) times daily.          . cephALEXin (KEFLEX) 500 MG capsule   Oral   Take 500 mg by mouth 3 (three) times daily. Patient states she is on her last dose as of today 07-19-13         . enoxaparin (LOVENOX) 150 MG/ML injection   Subcutaneous   Inject 1.3 mLs (195 mg total) into  the skin every 12 (twelve) hours.   14 Syringe   0   . escitalopram (LEXAPRO) 20 MG tablet   Oral   Take 20 mg by mouth daily.           . hydrochlorothiazide (HYDRODIURIL) 25 MG tablet   Oral   Take 25 mg by mouth at bedtime.          Marland Kitchen loperamide (IMODIUM) 2 MG capsule   Oral   Take 2 mg by mouth as needed for diarrhea or loose stools.          Marland Kitchen LORazepam (ATIVAN) 1 MG tablet   Oral   Take 1 mg by mouth 4 (four) times daily as needed for anxiety.          Marland Kitchen oxyCODONE-acetaminophen (PERCOCET/ROXICET) 5-325 MG per tablet   Oral   Take 1 tablet by mouth every 4 (four) hours as needed for moderate pain or severe pain.         Marland Kitchen warfarin (COUMADIN) 5 MG tablet   Oral   Take 12.5 mg by mouth daily at 6 PM.          BP 126/67  Pulse 77  Temp(Src) 99 F (37.2 C) (Oral)  Resp 18  SpO2 99%  LMP 06/26/2013 Physical Exam Gen: well developed and well nourished appearing Head: NCAT Eyes: PERL, EOMI Nose: no epistaixis or rhinorrhea Mouth/throat: mucosa is moist  and pink Neck: supple, no stridor Lungs: CTA B, no wheezing, rhonchi or rales CV: RRR, no murmur, extremities appear well perfused Abd: soft, morbidly obese, notender, nondistended Back: no ttp, no cva ttp, normal to inspection Skin: warm and dry Ext: no edema, normal to inspection Neuro: CN ii-xii grossly intact, no focal deficits Psyche; normal affect,  calm and cooperative.   ED Course  Procedures (including critical care time) Labs Review Results for orders placed during the hospital encounter of 07/24/13 (from the past 24 hour(s))  CBC     Status: None   Collection Time    07/25/13 12:16 AM      Result Value Range   WBC 8.8  4.0 - 10.5 K/uL   RBC 4.46  3.87 - 5.11 MIL/uL   Hemoglobin 12.9  12.0 - 15.0 g/dL   HCT 04.5  40.9 - 81.1 %   MCV 83.9  78.0 - 100.0 fL   MCH 28.9  26.0 - 34.0 pg   MCHC 34.5  30.0 - 36.0 g/dL   RDW 91.4  78.2 - 95.6 %   Platelets 251  150 - 400 K/uL  BASIC METABOLIC PANEL     Status: Abnormal   Collection Time    07/25/13 12:16 AM      Result Value Range   Sodium 138  135 - 145 mEq/L   Potassium 2.8 (*) 3.5 - 5.1 mEq/L   Chloride 99  96 - 112 mEq/L   CO2 23  19 - 32 mEq/L   Glucose, Bld 129 (*) 70 - 99 mg/dL   BUN 20  6 - 23 mg/dL   Creatinine, Ser 2.13  0.50 - 1.10 mg/dL   Calcium 9.4  8.4 - 08.6 mg/dL   GFR calc non Af Amer 65 (*) >90 mL/min   GFR calc Af Amer 75 (*) >90 mL/min   DG Chest 2 View (Final result)  Result time: 07/25/13 00:38:05    Final result by Rad Results In Interface (07/25/13 00:38:05)    Narrative:   CLINICAL DATA: Shortness of breath,  hemoptysis  EXAM: CHEST 2 VIEW  COMPARISON: Prior radiograph from 07/19/2013  FINDINGS: Moderate cardiomegaly is not significantly changed relative to prior exam.  Lungs are normally inflated. Triangular opacity within the medial right lung base is most compatible with right lower lobe atelectasis. Additional curvilinear opacity projecting over the lateral right lung base  likely represents a skin fold. No focal infiltrates identified. No pulmonary edema or pleural effusion. No pneumothorax.  IMPRESSION: 1. Right lower lobe atelectasis. No definite focal infiltrates, although a possible superimposed infiltrate within the right lung base is not entirely excluded. 2. Stable cardiomegaly without pulmonary edema or pleural effusion.      MDM    The patient's emergency department workup is nondiagnostic. She had INR of 2.2 less than 12 hours ago. Her CBC remains unremarkable. She has had no recurrent episode of blood-tinged sputum here in the emergency department. However, she does show me a sample of the sputum that was concerning to her. It is clear and slightly pink tinged. I do not have a significant level of suspicion for pulmonary hemorrhage or recurrent pulmonary embolus. Her vital signs are within normal limits with a pulse ox of 100% and pulse in the 70s. I believe that the patient is stable for discharge with plans for close outpatient followup.  Brandt Loosen, MD 07/25/13 [phone]

## 2013-07-25 NOTE — ED Notes (Signed)
Pt returned to room  

## 2013-07-25 NOTE — ED Notes (Signed)
MD at bedside. 

## 2013-07-29 NOTE — ED Provider Notes (Signed)
Medical screening examination/treatment/procedure(s) were performed by non-physician practitioner and as supervising physician I was immediately available for consultation/collaboration.  EKG Interpretation   None         [name] W. Markea Ruzich, MD 07/29/13 0824 

## 2013-08-01 NOTE — ED Provider Notes (Signed)
I saw and evaluated the patient, reviewed the resident's note and I agree with the findings and plan.  EKG Interpretation   None       Results for orders placed during the hospital encounter of 07/15/13  CBC      Result Value Range   WBC 9.5  4.0 - 10.5 K/uL   RBC 4.56  3.87 - 5.11 MIL/uL   Hemoglobin 13.3  12.0 - 15.0 g/dL   HCT 39.1  36.0 - 46.0 %   MCV 85.7  78.0 - 100.0 fL   MCH 29.2  26.0 - 34.0 pg   MCHC 34.0  30.0 - 36.0 g/dL   RDW 13.5  11.5 - 15.5 %   Platelets 304  150 - 400 K/uL  BASIC METABOLIC PANEL      Result Value Range   Sodium 138  135 - 145 mEq/L   Potassium 3.9  3.5 - 5.1 mEq/L   Chloride 101  96 - 112 mEq/L   CO2 22  19 - 32 mEq/L   Glucose, Bld 99  70 - 99 mg/dL   BUN 13  6 - 23 mg/dL   Creatinine, Ser 0.77  0.50 - 1.10 mg/dL   Calcium 10.3  8.4 - 10.5 mg/dL   GFR calc non Af Amer >90  >90 mL/min   GFR calc Af Amer >90  >90 mL/min  GLUCOSE, CAPILLARY      Result Value Range   Glucose-Capillary 94  70 - 99 mg/dL  LACTIC ACID, PLASMA      Result Value Range   Lactic Acid, Venous 1.8  0.5 - 2.2 mmol/L  PRO B NATRIURETIC PEPTIDE      Result Value Range   Pro B Natriuretic peptide (BNP) 49.5  0 - 125 pg/mL  TROPONIN I      Result Value Range   Troponin I <0.30  <0.30 ng/mL  TROPONIN I      Result Value Range   Troponin I <0.30  <0.30 ng/mL  TROPONIN I      Result Value Range   Troponin I <0.30  <0.30 ng/mL  TROPONIN I      Result Value Range   Troponin I <0.30  <0.30 ng/mL  CBC WITH DIFFERENTIAL      Result Value Range   WBC 11.2 (*) 4.0 - 10.5 K/uL   RBC 4.13  3.87 - 5.11 MIL/uL   Hemoglobin 11.7 (*) 12.0 - 15.0 g/dL   HCT 35.8 (*) 36.0 - 46.0 %   MCV 86.7  78.0 - 100.0 fL   MCH 28.3  26.0 - 34.0 pg   MCHC 32.7  30.0 - 36.0 g/dL   RDW 13.4  11.5 - 15.5 %   Platelets 275  150 - 400 K/uL   Neutrophils Relative % 64  43 - 77 %   Neutro Abs 7.2  1.7 - 7.7 K/uL   Lymphocytes Relative 25  12 - 46 %   Lymphs Abs 2.8  0.7 - 4.0 K/uL    Monocytes Relative 9  3 - 12 %   Monocytes Absolute 1.0  0.1 - 1.0 K/uL   Eosinophils Relative 1  0 - 5 %   Eosinophils Absolute 0.2  0.0 - 0.7 K/uL   Basophils Relative 0  0 - 1 %   Basophils Absolute 0.0  0.0 - 0.1 K/uL  COMPREHENSIVE METABOLIC PANEL      Result Value Range   Sodium 139  135 - 145 mEq/L  Potassium 4.0  3.5 - 5.1 mEq/L   Chloride 103  96 - 112 mEq/L   CO2 26  19 - 32 mEq/L   Glucose, Bld 127 (*) 70 - 99 mg/dL   BUN 13  6 - 23 mg/dL   Creatinine, Ser 1.01  0.50 - 1.10 mg/dL   Calcium 9.7  8.4 - 10.5 mg/dL   Total Protein 6.9  6.0 - 8.3 g/dL   Albumin 3.6  3.5 - 5.2 g/dL   AST 19  0 - 37 U/L   ALT 19  0 - 35 U/L   Alkaline Phosphatase 66  39 - 117 U/L   Total Bilirubin 0.6  0.3 - 1.2 mg/dL   GFR calc non Af Amer 64 (*) >90 mL/min   GFR calc Af Amer 74 (*) >90 mL/min  PROTIME-INR      Result Value Range   Prothrombin Time 20.5 (*) 11.6 - 15.2 seconds   INR 1.82 (*) 0.00 - 1.49  APTT      Result Value Range   aPTT 47 (*) 24 - 37 seconds  GLUCOSE, CAPILLARY      Result Value Range   Glucose-Capillary 112 (*) 70 - 99 mg/dL   Comment 1 Documented in Chart     Comment 2 Notify RN    GLUCOSE, CAPILLARY      Result Value Range   Glucose-Capillary 105 (*) 70 - 99 mg/dL  CBC      Result Value Range   WBC 7.2  4.0 - 10.5 K/uL   RBC 4.20  3.87 - 5.11 MIL/uL   Hemoglobin 12.1  12.0 - 15.0 g/dL   HCT 36.6  36.0 - 46.0 %   MCV 87.1  78.0 - 100.0 fL   MCH 28.8  26.0 - 34.0 pg   MCHC 33.1  30.0 - 36.0 g/dL   RDW 13.5  11.5 - 15.5 %   Platelets 259  150 - 400 K/uL  PROTIME-INR      Result Value Range   Prothrombin Time 16.9 (*) 11.6 - 15.2 seconds   INR 1.41  0.00 - 1.49   Dg Chest 2 View  07/25/2013   CLINICAL DATA:  Shortness of breath, hemoptysis  EXAM: CHEST  2 VIEW  COMPARISON:  Prior radiograph from 07/19/2013  FINDINGS: Moderate cardiomegaly is not significantly changed relative to prior exam.  Lungs are normally inflated. Triangular opacity within the  medial right lung base is most compatible with right lower lobe atelectasis. Additional curvilinear opacity projecting over the lateral right lung base likely represents a skin fold. No focal infiltrates identified. No pulmonary edema or pleural effusion. No pneumothorax.  IMPRESSION: 1. Right lower lobe atelectasis. No definite focal infiltrates, although a possible superimposed infiltrate within the right lung base is not entirely excluded. 2. Stable cardiomegaly without pulmonary edema or pleural effusion.   Electronically Signed   By: Jeannine Boga M.D.   On: 07/25/2013 00:38   Dg Chest 2 View  07/19/2013   CLINICAL DATA:  54 year old female with fever  EXAM: CHEST  2 VIEW  COMPARISON:  07/15/2013 and prior chest radiographs  FINDINGS: Cardiomegaly again identified.  Mild peribronchial thickening is stable.  There is no evidence of focal airspace disease, pulmonary edema, suspicious pulmonary nodule/mass, pleural effusion, or pneumothorax. No acute bony abnormalities are identified.  IMPRESSION: Cardiomegaly without evidence of acute cardiopulmonary disease.   Electronically Signed   By: Hassan Rowan M.D.   On: 07/19/2013 23:07   Dg Chest  2 View (if Patient Has Fever And/or Copd)  07/15/2013   CLINICAL DATA:  Shortness of breath and cough  EXAM: CHEST  2 VIEW  COMPARISON:  February 17, 2013  FINDINGS: Lungs are clear. Heart is mildly enlarged with normal pulmonary vascularity. No adenopathy. There is degenerative change in the lower thoracic spine.  IMPRESSION: Heart mildly enlarged.  No edema or consolidation.   Electronically Signed   By: Lowella Grip M.D.   On: 07/15/2013 16:18   Ct Angio Chest Pe W/cm &/or Wo Cm  07/16/2013   CLINICAL DATA:  DVT right leg, on Xarelto, shortness of breath, chest pain, question pulmonary embolism, history hypertension, diabetes, asthma  EXAM: CT ANGIOGRAPHY CHEST WITH CONTRAST  TECHNIQUE: Multidetector CT imaging of the chest was performed using the standard  protocol during bolus administration of intravenous contrast. Multiplanar CT image reconstructions including MIPs were obtained to evaluate the vascular anatomy.  CONTRAST:  124mL OMNIPAQUE IOHEXOL 350 MG/ML SOLN  COMPARISON:  None  FINDINGS: Aorta normal caliber without aneurysm or dissection.  Degradation of image quality secondary to body habitus.  Questionable filling defect in the right lower lobe pulmonary artery image 151 series 5.  Remaining pulmonary arteries appear grossly patent.  Unable to exclude small peripheral emboli on this exam particularly in lower lobes.  No thoracic adenopathy.  Visualized portion of upper abdomen unremarkable.  Lungs clear.  No infiltrate, pleural effusion or pneumothorax.  Scattered degenerative disc disease changes thoracic spine.  No acute osseous findings.  Review of the MIP images confirms the above findings.  IMPRESSION: Single questionable pulmonary embolus in aright lower lobe pulmonary artery.  Remainder of exam unremarkable.  Critical Value/emergent results were called by telephone at the time of interpretation on 07/16/2013 at 12:38 AM to [facility] , who verbally acknowledged these results.   Electronically Signed   By: Lavonia Dana M.D.   On: 07/16/2013 00:38    The patient seen by me. The patient recently discharged from the hospital after being admitted for pulmonary embolus. Also on Keflex for a right lower trimming the cellulitis. That seems to be improving. Patient developed a fever sent to the ED for further evaluation. Fever associated with a cough congestion and sore throat most likely representing upper respiratory infection. Chest x-ray negative for pneumonia. Patient nontoxic no acute distress. Can be discharged home.  Mervin Kung, MD 08/01/13 [phone]

## 2013-08-02 ENCOUNTER — Emergency Department: Payer: Self-pay | Admitting: Emergency Medicine

## 2013-08-02 LAB — PROTIME-INR
INR: 2.4
Prothrombin Time: 25.4 secs — ABNORMAL HIGH (ref 11.5–14.7)

## 2013-08-02 LAB — COMPREHENSIVE METABOLIC PANEL
ALBUMIN: 3.4 g/dL (ref 3.4–5.0)
ALT: 54 U/L (ref 12–78)
ANION GAP: 6 — AB (ref 7–16)
Alkaline Phosphatase: 90 U/L
BILIRUBIN TOTAL: 0.4 mg/dL (ref 0.2–1.0)
BUN: 14 mg/dL (ref 7–18)
CALCIUM: 9.7 mg/dL (ref 8.5–10.1)
CHLORIDE: 104 mmol/L (ref 98–107)
CO2: 27 mmol/L (ref 21–32)
Creatinine: 0.81 mg/dL (ref 0.60–1.30)
Glucose: 107 mg/dL — ABNORMAL HIGH (ref 65–99)
OSMOLALITY: 275 (ref 275–301)
Potassium: 3.5 mmol/L (ref 3.5–5.1)
SGOT(AST): 27 U/L (ref 15–37)
Sodium: 137 mmol/L (ref 136–145)
Total Protein: 7 g/dL (ref 6.4–8.2)

## 2013-08-02 LAB — CBC
HCT: 34.2 % — AB (ref 35.0–47.0)
HGB: 11.5 g/dL — ABNORMAL LOW (ref 12.0–16.0)
MCH: 28 pg (ref 26.0–34.0)
MCHC: 33.7 g/dL (ref 32.0–36.0)
MCV: 83 fL (ref 80–100)
Platelet: 272 10*3/uL (ref 150–440)
RBC: 4.12 10*6/uL (ref 3.80–5.20)
RDW: 13.7 % (ref 11.5–14.5)
WBC: 7.4 10*3/uL (ref 3.6–11.0)

## 2013-08-03 ENCOUNTER — Emergency Department: Payer: Self-pay | Admitting: Emergency Medicine

## 2013-08-03 LAB — COMPREHENSIVE METABOLIC PANEL
ALK PHOS: 99 U/L
ANION GAP: 8 (ref 7–16)
AST: 32 U/L (ref 15–37)
Albumin: 3.4 g/dL (ref 3.4–5.0)
BILIRUBIN TOTAL: 0.4 mg/dL (ref 0.2–1.0)
BUN: 14 mg/dL (ref 7–18)
CALCIUM: 10.2 mg/dL — AB (ref 8.5–10.1)
Chloride: 103 mmol/L (ref 98–107)
Co2: 24 mmol/L (ref 21–32)
Creatinine: 0.94 mg/dL (ref 0.60–1.30)
EGFR (Non-African Amer.): >60
Glucose: 122 mg/dL — ABNORMAL HIGH (ref 65–99)
Osmolality: 272 (ref 275–301)
Potassium: 3.6 mmol/L (ref 3.5–5.1)
SGPT (ALT): 59 U/L (ref 12–78)
Sodium: 135 mmol/L — ABNORMAL LOW (ref 136–145)
Total Protein: 7.3 g/dL (ref 6.4–8.2)

## 2013-08-03 LAB — URINALYSIS, COMPLETE
BLOOD: NEGATIVE
Bilirubin,UR: NEGATIVE
Glucose,UR: NEGATIVE mg/dL (ref 0–75)
Ketone: NEGATIVE
Leukocyte Esterase: NEGATIVE
Nitrite: NEGATIVE
Ph: 5 (ref 4.5–8.0)
Protein: NEGATIVE
RBC, UR: NONE SEEN /HPF (ref 0–5)
Specific Gravity: 1.024 (ref 1.003–1.030)
Squamous Epithelial: 2

## 2013-08-03 LAB — CBC WITH DIFFERENTIAL/PLATELET
BASOS ABS: 0 10*3/uL (ref 0.0–0.1)
Basophil %: 0.2 %
Eosinophil #: 0.1 10*3/uL (ref 0.0–0.7)
Eosinophil %: 1.7 %
HCT: 36 % (ref 35.0–47.0)
HGB: 12.1 g/dL (ref 12.0–16.0)
LYMPHS PCT: 32.5 %
Lymphocyte #: 2.4 10*3/uL (ref 1.0–3.6)
MCH: 28.1 pg (ref 26.0–34.0)
MCHC: 33.6 g/dL (ref 32.0–36.0)
MCV: 84 fL (ref 80–100)
MONO ABS: 0.6 x10 3/mm (ref 0.2–0.9)
Monocyte %: 8.2 %
NEUTROS ABS: 4.3 10*3/uL (ref 1.4–6.5)
NEUTROS PCT: 57.4 %
PLATELETS: 282 10*3/uL (ref 150–440)
RBC: 4.31 10*6/uL (ref 3.80–5.20)
RDW: 13.7 % (ref 11.5–14.5)
WBC: 7.5 10*3/uL (ref 3.6–11.0)

## 2013-08-03 LAB — LIPASE, BLOOD: Lipase: 275 U/L (ref 73–393)

## 2013-08-03 LAB — PROTIME-INR
INR: 2.4
Prothrombin Time: 25.6 secs — ABNORMAL HIGH (ref 11.5–14.7)

## 2013-08-03 LAB — PREGNANCY, URINE: Pregnancy Test, Urine: NEGATIVE m[IU]/mL

## 2013-08-03 LAB — TROPONIN I

## 2013-08-05 ENCOUNTER — Other Ambulatory Visit (HOSPITAL_COMMUNITY): Payer: Self-pay | Admitting: Family Medicine

## 2013-08-05 DIAGNOSIS — I2699 Other pulmonary embolism without acute cor pulmonale: Secondary | ICD-10-CM

## 2013-08-06 ENCOUNTER — Ambulatory Visit (HOSPITAL_COMMUNITY)
Admission: RE | Admit: 2013-08-06 | Discharge: 2013-08-06 | Disposition: A | Discharge status: Home / Self Care | Payer: Medicaid Other | Source: Ambulatory Visit | Attending: Family Medicine | Admitting: Family Medicine

## 2013-08-06 DIAGNOSIS — R05 Cough: Secondary | ICD-10-CM | POA: Insufficient documentation

## 2013-08-06 DIAGNOSIS — R0989 Other specified symptoms and signs involving the circulatory and respiratory systems: Secondary | ICD-10-CM | POA: Insufficient documentation

## 2013-08-06 DIAGNOSIS — I82409 Acute embolism and thrombosis of unspecified deep veins of unspecified lower extremity: Secondary | ICD-10-CM | POA: Insufficient documentation

## 2013-08-06 DIAGNOSIS — R059 Cough, unspecified: Secondary | ICD-10-CM | POA: Insufficient documentation

## 2013-08-06 DIAGNOSIS — I517 Cardiomegaly: Secondary | ICD-10-CM | POA: Insufficient documentation

## 2013-08-06 DIAGNOSIS — R079 Chest pain, unspecified: Secondary | ICD-10-CM | POA: Insufficient documentation

## 2013-08-06 DIAGNOSIS — R0602 Shortness of breath: Secondary | ICD-10-CM | POA: Insufficient documentation

## 2013-08-06 DIAGNOSIS — I2699 Other pulmonary embolism without acute cor pulmonale: Secondary | ICD-10-CM

## 2013-08-06 MED ORDER — IOHEXOL 350 MG/ML SOLN
100.0000 mL | Freq: Once | INTRAVENOUS | Status: AC | PRN
  Administered 2013-08-06: 100 mL via INTRAVENOUS

## 2013-08-09 ENCOUNTER — Emergency Department: Payer: Self-pay | Admitting: Emergency Medicine

## 2013-08-09 LAB — COMPREHENSIVE METABOLIC PANEL
Albumin: 3.3 g/dL — ABNORMAL LOW (ref 3.4–5.0)
Alkaline Phosphatase: 85 U/L
Anion Gap: 7 (ref 7–16)
BUN: 15 mg/dL (ref 7–18)
Bilirubin,Total: 0.5 mg/dL (ref 0.2–1.0)
CALCIUM: 9.6 mg/dL (ref 8.5–10.1)
CHLORIDE: 101 mmol/L (ref 98–107)
CREATININE: 0.78 mg/dL (ref 0.60–1.30)
Co2: 24 mmol/L (ref 21–32)
EGFR (Non-African Amer.): >60
GLUCOSE: 122 mg/dL — AB (ref 65–99)
Osmolality: 267 (ref 275–301)
POTASSIUM: 4.3 mmol/L (ref 3.5–5.1)
SGOT(AST): 44 U/L — ABNORMAL HIGH (ref 15–37)
SGPT (ALT): 48 U/L (ref 12–78)
Sodium: 132 mmol/L — ABNORMAL LOW (ref 136–145)
Total Protein: 7.4 g/dL (ref 6.4–8.2)

## 2013-08-09 LAB — URINALYSIS, COMPLETE
Bilirubin,UR: NEGATIVE
Blood: NEGATIVE
GLUCOSE, UR: NEGATIVE mg/dL (ref 0–75)
Ketone: NEGATIVE
LEUKOCYTE ESTERASE: NEGATIVE
Nitrite: NEGATIVE
PROTEIN: NEGATIVE
Ph: 7 (ref 4.5–8.0)
RBC,UR: <1 /HPF (ref 0–5)
SPECIFIC GRAVITY: 1.017 (ref 1.003–1.030)
Squamous Epithelial: 4

## 2013-08-09 LAB — LIPASE, BLOOD: Lipase: 249 U/L (ref 73–393)

## 2013-08-09 LAB — CBC
HCT: 35.4 % (ref 35.0–47.0)
HGB: 12.3 g/dL (ref 12.0–16.0)
MCH: 28.7 pg (ref 26.0–34.0)
MCHC: 34.6 g/dL (ref 32.0–36.0)
MCV: 83 fL (ref 80–100)
Platelet: 267 10*3/uL (ref 150–440)
RBC: 4.28 10*6/uL (ref 3.80–5.20)
RDW: 13.7 % (ref 11.5–14.5)
WBC: 7.9 10*3/uL (ref 3.6–11.0)

## 2013-08-09 LAB — PROTIME-INR
INR: 2.8
PROTHROMBIN TIME: 28.9 s — AB (ref 11.5–14.7)

## 2013-08-09 LAB — TROPONIN I: Troponin-I: <0.02 ng/mL

## 2013-08-10 LAB — URINE CULTURE

## 2013-08-13 ENCOUNTER — Emergency Department: Payer: Self-pay | Admitting: Emergency Medicine

## 2013-08-13 LAB — BASIC METABOLIC PANEL
Anion Gap: 8 (ref 7–16)
BUN: 14 mg/dL (ref 7–18)
Calcium, Total: 9.9 mg/dL (ref 8.5–10.1)
Chloride: 105 mmol/L (ref 98–107)
Co2: 23 mmol/L (ref 21–32)
Creatinine: 1 mg/dL (ref 0.60–1.30)
Glucose: 96 mg/dL (ref 65–99)
OSMOLALITY: 272 (ref 275–301)
POTASSIUM: 3.7 mmol/L (ref 3.5–5.1)
SODIUM: 136 mmol/L (ref 136–145)

## 2013-08-13 LAB — CBC
HCT: 37.4 % (ref 35.0–47.0)
HGB: 12.5 g/dL (ref 12.0–16.0)
MCH: 27.8 pg (ref 26.0–34.0)
MCHC: 33.5 g/dL (ref 32.0–36.0)
MCV: 83 fL (ref 80–100)
Platelet: 265 10*3/uL (ref 150–440)
RBC: 4.5 10*6/uL (ref 3.80–5.20)
RDW: 13.9 % (ref 11.5–14.5)
WBC: 8.1 10*3/uL (ref 3.6–11.0)

## 2013-08-13 LAB — TROPONIN I: Troponin-I: <0.02 ng/mL

## 2013-08-13 LAB — PROTIME-INR
INR: 3.2
Prothrombin Time: 31.5 secs — ABNORMAL HIGH (ref 11.5–14.7)

## 2013-08-14 ENCOUNTER — Ambulatory Visit: Payer: Medicaid Other | Admitting: Cardiology

## 2013-08-14 ENCOUNTER — Encounter (HOSPITAL_COMMUNITY): Payer: Self-pay | Admitting: Emergency Medicine

## 2013-08-14 ENCOUNTER — Emergency Department (HOSPITAL_COMMUNITY): Payer: Medicaid Other

## 2013-08-14 ENCOUNTER — Observation Stay (HOSPITAL_COMMUNITY)
Admission: EM | Admit: 2013-08-14 | Discharge: 2013-08-15 | Disposition: A | Discharge status: Home / Self Care | Payer: Medicaid Other | Attending: Internal Medicine | Admitting: Internal Medicine

## 2013-08-14 DIAGNOSIS — I2699 Other pulmonary embolism without acute cor pulmonale: Secondary | ICD-10-CM

## 2013-08-14 DIAGNOSIS — F329 Major depressive disorder, single episode, unspecified: Secondary | ICD-10-CM | POA: Insufficient documentation

## 2013-08-14 DIAGNOSIS — F3289 Other specified depressive episodes: Secondary | ICD-10-CM | POA: Insufficient documentation

## 2013-08-14 DIAGNOSIS — Z79899 Other long term (current) drug therapy: Secondary | ICD-10-CM | POA: Insufficient documentation

## 2013-08-14 DIAGNOSIS — A0472 Enterocolitis due to Clostridium difficile, not specified as recurrent: Secondary | ICD-10-CM

## 2013-08-14 DIAGNOSIS — Z872 Personal history of diseases of the skin and subcutaneous tissue: Secondary | ICD-10-CM | POA: Insufficient documentation

## 2013-08-14 DIAGNOSIS — Z9981 Dependence on supplemental oxygen: Secondary | ICD-10-CM | POA: Insufficient documentation

## 2013-08-14 DIAGNOSIS — Z881 Allergy status to other antibiotic agents status: Secondary | ICD-10-CM | POA: Insufficient documentation

## 2013-08-14 DIAGNOSIS — F419 Anxiety disorder, unspecified: Secondary | ICD-10-CM

## 2013-08-14 DIAGNOSIS — R05 Cough: Secondary | ICD-10-CM | POA: Insufficient documentation

## 2013-08-14 DIAGNOSIS — Z882 Allergy status to sulfonamides status: Secondary | ICD-10-CM | POA: Insufficient documentation

## 2013-08-14 DIAGNOSIS — M129 Arthropathy, unspecified: Secondary | ICD-10-CM | POA: Insufficient documentation

## 2013-08-14 DIAGNOSIS — Z885 Allergy status to narcotic agent status: Secondary | ICD-10-CM | POA: Insufficient documentation

## 2013-08-14 DIAGNOSIS — Z7901 Long term (current) use of anticoagulants: Secondary | ICD-10-CM | POA: Insufficient documentation

## 2013-08-14 DIAGNOSIS — F411 Generalized anxiety disorder: Secondary | ICD-10-CM | POA: Diagnosis present

## 2013-08-14 DIAGNOSIS — R059 Cough, unspecified: Secondary | ICD-10-CM | POA: Insufficient documentation

## 2013-08-14 DIAGNOSIS — E119 Type 2 diabetes mellitus without complications: Secondary | ICD-10-CM | POA: Insufficient documentation

## 2013-08-14 DIAGNOSIS — I10 Essential (primary) hypertension: Secondary | ICD-10-CM | POA: Diagnosis present

## 2013-08-14 DIAGNOSIS — R0600 Dyspnea, unspecified: Secondary | ICD-10-CM | POA: Diagnosis present

## 2013-08-14 DIAGNOSIS — R079 Chest pain, unspecified: Secondary | ICD-10-CM

## 2013-08-14 DIAGNOSIS — R0602 Shortness of breath: Secondary | ICD-10-CM

## 2013-08-14 DIAGNOSIS — F32A Depression, unspecified: Secondary | ICD-10-CM

## 2013-08-14 DIAGNOSIS — M799 Soft tissue disorder, unspecified: Secondary | ICD-10-CM

## 2013-08-14 DIAGNOSIS — Z86718 Personal history of other venous thrombosis and embolism: Secondary | ICD-10-CM | POA: Insufficient documentation

## 2013-08-14 DIAGNOSIS — R002 Palpitations: Secondary | ICD-10-CM | POA: Diagnosis present

## 2013-08-14 DIAGNOSIS — G4730 Sleep apnea, unspecified: Secondary | ICD-10-CM | POA: Insufficient documentation

## 2013-08-14 DIAGNOSIS — R197 Diarrhea, unspecified: Secondary | ICD-10-CM

## 2013-08-14 DIAGNOSIS — G4733 Obstructive sleep apnea (adult) (pediatric): Secondary | ICD-10-CM | POA: Diagnosis present

## 2013-08-14 DIAGNOSIS — R2240 Localized swelling, mass and lump, unspecified lower limb: Secondary | ICD-10-CM | POA: Diagnosis present

## 2013-08-14 DIAGNOSIS — J45909 Unspecified asthma, uncomplicated: Secondary | ICD-10-CM | POA: Insufficient documentation

## 2013-08-14 DIAGNOSIS — R110 Nausea: Secondary | ICD-10-CM

## 2013-08-14 DIAGNOSIS — R0789 Other chest pain: Principal | ICD-10-CM | POA: Diagnosis present

## 2013-08-14 DIAGNOSIS — Z8619 Personal history of other infectious and parasitic diseases: Secondary | ICD-10-CM | POA: Insufficient documentation

## 2013-08-14 DIAGNOSIS — Z888 Allergy status to other drugs, medicaments and biological substances status: Secondary | ICD-10-CM | POA: Insufficient documentation

## 2013-08-14 DIAGNOSIS — Z8719 Personal history of other diseases of the digestive system: Secondary | ICD-10-CM | POA: Insufficient documentation

## 2013-08-14 DIAGNOSIS — R209 Unspecified disturbances of skin sensation: Secondary | ICD-10-CM | POA: Insufficient documentation

## 2013-08-14 LAB — CBC WITH DIFFERENTIAL/PLATELET
BASOS ABS: 0 10*3/uL (ref 0.0–0.1)
BASOS PCT: 0 % (ref 0–1)
EOS PCT: 1 % (ref 0–5)
Eosinophils Absolute: 0.1 10*3/uL (ref 0.0–0.7)
HEMATOCRIT: 35 % — AB (ref 36.0–46.0)
HEMOGLOBIN: 12 g/dL (ref 12.0–15.0)
Lymphocytes Relative: 25 % (ref 12–46)
Lymphs Abs: 1.4 10*3/uL (ref 0.7–4.0)
MCH: 28.1 pg (ref 26.0–34.0)
MCHC: 34.3 g/dL (ref 30.0–36.0)
MCV: 82 fL (ref 78.0–100.0)
MONO ABS: 0.4 10*3/uL (ref 0.1–1.0)
Monocytes Relative: 7 % (ref 3–12)
Neutro Abs: 3.7 10*3/uL (ref 1.7–7.7)
Neutrophils Relative %: 66 % (ref 43–77)
Platelets: 252 10*3/uL (ref 150–400)
RBC: 4.27 MIL/uL (ref 3.87–5.11)
RDW: 14 % (ref 11.5–15.5)
WBC: 5.5 10*3/uL (ref 4.0–10.5)

## 2013-08-14 LAB — POCT I-STAT 3, ART BLOOD GAS (G3+)
ACID-BASE DEFICIT: 1 mmol/L (ref 0.0–2.0)
BICARBONATE: 21.5 meq/L (ref 20.0–24.0)
O2 Saturation: 98 %
TCO2: 22 mmol/L (ref 0–100)
pCO2 arterial: 27.6 mmHg — ABNORMAL LOW (ref 35.0–45.0)
pH, Arterial: 7.5 — ABNORMAL HIGH (ref 7.350–7.450)
pO2, Arterial: 101 mmHg — ABNORMAL HIGH (ref 80.0–100.0)

## 2013-08-14 LAB — POCT I-STAT TROPONIN I: TROPONIN I, POC: 0 ng/mL (ref 0.00–0.08)

## 2013-08-14 LAB — LIPID PANEL
CHOLESTEROL: 245 mg/dL — AB (ref 0–200)
HDL: 46 mg/dL (ref >39)
LDL Cholesterol: 173 mg/dL — ABNORMAL HIGH (ref 0–99)
TRIGLYCERIDES: 132 mg/dL (ref <150)
Total CHOL/HDL Ratio: 5.3 RATIO
VLDL: 26 mg/dL (ref 0–40)

## 2013-08-14 LAB — PROTIME-INR
INR: 3.66 — AB (ref 0.00–1.49)
Prothrombin Time: 35 seconds — ABNORMAL HIGH (ref 11.6–15.2)

## 2013-08-14 LAB — TROPONIN I: Troponin I: <0.3 ng/mL (ref <0.30)

## 2013-08-14 LAB — PRO B NATRIURETIC PEPTIDE: Pro B Natriuretic peptide (BNP): 53.1 pg/mL (ref 0–125)

## 2013-08-14 LAB — TSH: TSH: 0.912 u[IU]/mL (ref 0.350–4.500)

## 2013-08-14 MED ORDER — ASPIRIN 81 MG PO CHEW
324.0000 mg | CHEWABLE_TABLET | Freq: Once | ORAL | Status: AC
  Administered 2013-08-14: 324 mg via ORAL
  Filled 2013-08-14: qty 4

## 2013-08-14 MED ORDER — AMOXICILLIN 500 MG PO CAPS
1000.0000 mg | ORAL_CAPSULE | Freq: Two times a day (BID) | ORAL | Status: DC
  Administered 2013-08-14 – 2013-08-15 (×2): 1000 mg via ORAL
  Filled 2013-08-14 (×3): qty 2

## 2013-08-14 MED ORDER — ESCITALOPRAM OXALATE 20 MG PO TABS
20.0000 mg | ORAL_TABLET | Freq: Every day | ORAL | Status: DC
  Administered 2013-08-15: 20 mg via ORAL
  Filled 2013-08-14: qty 1

## 2013-08-14 MED ORDER — SIMVASTATIN 20 MG PO TABS
20.0000 mg | ORAL_TABLET | Freq: Every day | ORAL | Status: DC
  Administered 2013-08-14: 20 mg via ORAL
  Filled 2013-08-14 (×2): qty 1

## 2013-08-14 MED ORDER — ACETAMINOPHEN 325 MG PO TABS: 650.0000 mg | ORAL_TABLET | ORAL | Status: DC | PRN

## 2013-08-14 MED ORDER — LORAZEPAM 2 MG/ML IJ SOLN
1.0000 mg | Freq: Once | INTRAMUSCULAR | Status: AC
  Administered 2013-08-14: 1 mg via INTRAVENOUS
  Filled 2013-08-14: qty 1

## 2013-08-14 MED ORDER — ASPIRIN EC 81 MG PO TBEC: 81.0000 mg | DELAYED_RELEASE_TABLET | Freq: Every day | ORAL | Status: DC

## 2013-08-14 MED ORDER — BUSPIRONE HCL 15 MG PO TABS
15.0000 mg | ORAL_TABLET | Freq: Three times a day (TID) | ORAL | Status: DC
  Administered 2013-08-14 – 2013-08-15 (×3): 15 mg via ORAL
  Filled 2013-08-14 (×7): qty 1

## 2013-08-14 MED ORDER — ONDANSETRON HCL 4 MG/2ML IJ SOLN
4.0000 mg | Freq: Four times a day (QID) | INTRAMUSCULAR | Status: DC | PRN
Start: 2013-08-14 — End: 2013-08-15

## 2013-08-14 MED ORDER — HYDROCHLOROTHIAZIDE 25 MG PO TABS
25.0000 mg | ORAL_TABLET | Freq: Every day | ORAL | Status: DC
  Administered 2013-08-14: 25 mg via ORAL
  Filled 2013-08-14 (×2): qty 1

## 2013-08-14 MED ORDER — KETOROLAC TROMETHAMINE 30 MG/ML IJ SOLN
30.0000 mg | Freq: Once | INTRAMUSCULAR | Status: AC
  Administered 2013-08-14: 30 mg via INTRAVENOUS
  Filled 2013-08-14: qty 1

## 2013-08-14 MED ORDER — PANTOPRAZOLE SODIUM 40 MG PO TBEC
40.0000 mg | DELAYED_RELEASE_TABLET | Freq: Two times a day (BID) | ORAL | Status: DC
  Administered 2013-08-14 – 2013-08-15 (×2): 40 mg via ORAL
  Filled 2013-08-14 (×2): qty 1

## 2013-08-14 MED ORDER — BUPROPION HCL ER (XL) 300 MG PO TB24
300.0000 mg | ORAL_TABLET | Freq: Every day | ORAL | Status: DC
  Administered 2013-08-15: 300 mg via ORAL
  Filled 2013-08-14: qty 1

## 2013-08-14 MED ORDER — AMOXICILL-CLARITHRO-LANSOPRAZ PO MISC: 1.0000 | Freq: Two times a day (BID) | ORAL | Status: DC

## 2013-08-14 MED ORDER — LISINOPRIL 10 MG PO TABS
10.0000 mg | ORAL_TABLET | Freq: Every day | ORAL | Status: DC
  Administered 2013-08-15: 10 mg via ORAL
  Filled 2013-08-14: qty 1

## 2013-08-14 MED ORDER — CLARITHROMYCIN 500 MG PO TABS
500.0000 mg | ORAL_TABLET | Freq: Two times a day (BID) | ORAL | Status: DC
  Administered 2013-08-14 – 2013-08-15 (×2): 500 mg via ORAL
  Filled 2013-08-14 (×3): qty 1

## 2013-08-14 NOTE — ED Notes (Signed)
Patient transported to X-ray 

## 2013-08-14 NOTE — ED Notes (Signed)
PT reports chest discomfort past 24 hours increases with activity; EMS gave aspiring 324mg  and has not had nitro since she is allergic (uncontrolled panic attacks per pt). EMS 12 lead normal sinus. Pt having mild nausea; no diaphoresis and non radiating pain.

## 2013-08-14 NOTE — ED Provider Notes (Signed)
CSN: [id number]     Arrival date & time 08/14/13  1017 History   First MD Initiated Contact with Patient 08/14/13 1029     Chief Complaint  Patient presents with  . Chest Pain   (Consider location/radiation/quality/duration/timing/severity/associated sxs/prior Treatment) HPI  This a 53 year old female with history of hypertension, diabetes, sleep apnea, and pulmonary embolism who presents with chest pain. Patient reports a three-week history of chest pain. Patient reports worsening symptoms over the last 24 hours. She states that nothing makes the pain better or worse. She endorses cough without fever. She states the pain is pressure-like and nonradiating. Currently pain is 5/10. Patient was given aspirin by EMS. Patient also endorses nausea without vomiting. On review of systems, patient reports palpitations, dizziness, and head numbness. She states that she was seen last night at [facility] where she had a head CT and lab work that was reassuring. She was also seen 2 days ago by a cardiologist who recommended outpatient stress testing and echo. She has not had these done yet.  Patient also states that over the last several nights she has "stopped breathing at night." She uses a CPAP machine but states that this is not helping.   Past Medical History  Diagnosis Date  . Hypertension   . Depression   . Type 2 diabetes mellitus   . Diverticulitis   . Asthma   . Cyst     pt has "cyst" to legs for years, areas drain at time  . Pancreatitis   . Arthritis   . C. difficile colitis 11/08/2011  . Musculoskeletal pain 11/08/2011  . Neuropathic pain 11/10/2011  . Sleep apnea with use of continuous positive airway pressure (CPAP)     uses CPAP at night  . 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 DVT (deep venous thrombosis)    Past Surgical History  Procedure Laterality Date  . Tonsillectomy    . Cholecystectomy    . Cesarean section    . Nose surgery      History reviewed. No pertinent family history. History  Substance Use Topics  . Smoking status: Never Smoker   . Smokeless tobacco: Never Used  . Alcohol Use: No   OB History   Grav Para Term Preterm Abortions TAB SAB Ect Mult Living                 Review of Systems  Constitutional: Negative for fever and chills.  Respiratory: Positive for cough and chest tightness. Negative for shortness of breath.   Cardiovascular: Positive for palpitations. Negative for chest pain.  Gastrointestinal: Positive for nausea. Negative for vomiting and abdominal pain.  Genitourinary: Negative for dysuria.  Musculoskeletal: Negative for back pain.  Skin: Negative for wound.  Neurological: Positive for numbness. Negative for headaches.  Psychiatric/Behavioral: Negative for confusion.  All other systems reviewed and are negative.    Allergies  Nitroglycerin; Tramadol; Dilaudid; Levaquin; Trazodone and nefazodone; and Sulfa antibiotics  Home Medications   Current Outpatient Rx  Name  Route  Sig  Dispense  Refill  . acetaminophen (TYLENOL) 500 MG tablet   Oral   Take 500 mg by mouth every 6 (six) hours as needed for mild pain.         Marland Kitchen albuterol (PROVENTIL HFA;VENTOLIN HFA) 108 (90 BASE) MCG/ACT inhaler   Inhalation   Inhale 2 puffs into the lungs every 4 (four) hours as needed for wheezing or shortness of breath.   1 Inhaler  0   . amoxicillin-clarithromycin-lansoprazole (PREVPAC) combo pack   Oral   Take 1 each by mouth 2 (two) times daily. Just started on yesterday for stomach/intestine infection. Follow package directions.         Marland Kitchen buPROPion (WELLBUTRIN XL) 300 MG 24 hr tablet   Oral   Take 300 mg by mouth daily.          . busPIRone (BUSPAR) 15 MG tablet   Oral   Take 15 mg by mouth 3 (three) times daily.          Marland Kitchen escitalopram (LEXAPRO) 20 MG tablet   Oral   Take 20 mg by mouth daily.           Marland Kitchen glimepiride (AMARYL) 4 MG tablet   Oral   Take 4 mg by mouth  daily with breakfast.         . hydrochlorothiazide (HYDRODIURIL) 25 MG tablet   Oral   Take 25 mg by mouth at bedtime.          Marland Kitchen lisinopril (PRINIVIL,ZESTRIL) 10 MG tablet   Oral   Take 10 mg by mouth daily.         Marland Kitchen loperamide (IMODIUM) 2 MG capsule   Oral   Take 2 mg by mouth as needed for diarrhea or loose stools.          Marland Kitchen LORazepam (ATIVAN) 1 MG tablet   Oral   Take 1 mg by mouth 4 (four) times daily as needed for anxiety.          Marland Kitchen warfarin (COUMADIN) 5 MG tablet   Oral   Take 7.5 mg by mouth daily at 6 PM.           BP 142/58  Pulse 80  Temp(Src) 97 F (36.1 C) (Oral)  Resp 16  SpO2 100%  LMP 08/14/2013 Physical Exam  Nursing note and vitals reviewed. Constitutional: She is oriented to person, place, and time.  Tearful, anxious, morbidly obese  HENT:  Head: Normocephalic and atraumatic.  Mouth/Throat: Oropharynx is clear and moist.  Eyes: Pupils are equal, round, and reactive to light.  Neck: Neck supple.  Cardiovascular: Normal rate, regular rhythm and normal heart sounds.   No murmur heard. Pulmonary/Chest: Effort normal. No respiratory distress. She has no wheezes. She exhibits no tenderness.  Abdominal: Soft. Bowel sounds are normal. There is no tenderness. There is no rebound and no guarding.  Musculoskeletal: She exhibits no edema.  Neurological: She is alert and oriented to person, place, and time.  Skin: Skin is warm and dry.  Psychiatric: She has a normal mood and affect.    ED Course  Procedures (including critical care time) Labs Review Labs Reviewed  CBC WITH DIFFERENTIAL - Abnormal; Notable for the following:    HCT 35.0 (*)    All other components within normal limits  PROTIME-INR - Abnormal; Notable for the following:    Prothrombin Time 35.0 (*)    INR 3.66 (*)    All other components within normal limits  PRO B NATRIURETIC PEPTIDE  BLOOD GAS, ARTERIAL  POCT I-STAT TROPONIN I   Imaging Review Dg Chest 2  View  08/14/2013   CLINICAL DATA:  Chest tightness.  Short of breath.  Cough  EXAM: CHEST  2 VIEW  COMPARISON:  08/13/2013  FINDINGS: Cardiac silhouette is borderline enlarged. Normal mediastinal and hilar contours. Clear lungs. No pleural effusion or pneumothorax. The bony thorax is intact.  IMPRESSION: No active cardiopulmonary disease.  Electronically Signed   By: Lajean Manes M.D.   On: 08/14/2013 12:01    EKG Interpretation    Date/Time:  Wednesday August 14 2013 10:26:50 EST Ventricular Rate:  61 PR Interval:  216 QRS Duration: 93 QT Interval:  404 QTC Calculation: 407 R Axis:   10 Text Interpretation:  Sinus rhythm Prolonged PR interval Low voltage, precordial leads No significant change since last tracing Confirmed by Sofhia Ulibarri  MD, Toyoko Silos ([id number]) on 08/14/2013 11:09:12 AM            MDM   1. Chest pain    Patient presents with multiple medical complaints. She has been seen and evaluated multiple times at [facility] and add an outpatient cardiology office. I have received workup from [facility] which includes a negative head CT and cardiac enzymes. She also recently had a CTA in our system was in the last 10 days which was negative for PE. Basic labwork was obtained and is unremarkable including troponins. Chest x-ray is unremarkable EKG is unchanged. Patient continues to endorse chest pain it is very anxious about these episodes of "not being able to breathe at night." Given her persistent pain, will admit for chest pain rule out.  Discussed with teaching service.    Merryl Hacker, MD 08/14/13 [phone]

## 2013-08-14 NOTE — Progress Notes (Addendum)
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 Nitroglycerin Anxiety  . Tramadol Anaphylaxis and Swelling  . Dilaudid [Hydromorphone Hcl] Other (See Comments)    Hallucination  . Levaquin [Levofloxacin In D5w] Nausea And Vomiting  . Trazodone And Nefazodone   . Sulfa Antibiotics Rash     Vital Signs: Temp: 97.2 F (36.2 C) (01/14 1659) Temp src: Oral (01/14 1659) BP: 174/77 mmHg (01/14 1659) Pulse Rate: 81 (01/14 1659)  Labs:  Recent Labs  08/14/13 1049 08/14/13 1838  HGB 12.0  --   HCT 35.0*  --   PLT 252  --   LABPROT 35.0*  --   INR 3.66*  --   TROPONINI  --  <0.30    The CrCl is unknown because both a height and weight (above a minimum accepted value) are required for this calculation.   Medical History: Past Medical History  Diagnosis Date  . Hypertension   . Depression   . Type 2 diabetes mellitus   . Diverticulitis   . Asthma   . Cyst     pt has "cyst" to legs for years, areas drain at time  . Pancreatitis   . Arthritis   . C. difficile colitis 11/08/2011  . Musculoskeletal pain 11/08/2011  . Neuropathic pain 11/10/2011  . Sleep apnea with use of continuous positive airway pressure (CPAP)     uses CPAP at night  . 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 DVT (deep venous thrombosis)     Medications:  Prescriptions prior to admission  Medication Sig Dispense Refill  . acetaminophen (TYLENOL) 500 MG tablet Take 500 mg by mouth every 6 (six) hours as needed for mild pain.      Marland Kitchen albuterol (PROVENTIL HFA;VENTOLIN HFA) 108 (90 BASE) MCG/ACT inhaler Inhale 2 puffs into the lungs every 4 (four) hours as needed for wheezing or shortness of breath.  1 Inhaler  0  . amoxicillin-clarithromycin-lansoprazole (PREVPAC) combo pack Take 1 each by mouth 2 (two) times daily. Just started on yesterday for stomach/intestine  infection. Follow package directions.      Marland Kitchen buPROPion (WELLBUTRIN XL) 300 MG 24 hr tablet Take 300 mg by mouth daily.       . busPIRone (BUSPAR) 15 MG tablet Take 15 mg by mouth 3 (three) times daily.       Marland Kitchen escitalopram (LEXAPRO) 20 MG tablet Take 20 mg by mouth daily.        Marland Kitchen glimepiride (AMARYL) 4 MG tablet Take 4 mg by mouth daily with breakfast.      . hydrochlorothiazide (HYDRODIURIL) 25 MG tablet Take 25 mg by mouth at bedtime.       Marland Kitchen lisinopril (PRINIVIL,ZESTRIL) 10 MG tablet Take 10 mg by mouth daily.      Marland Kitchen loperamide (IMODIUM) 2 MG capsule Take 2 mg by mouth as needed for diarrhea or loose stools.       Marland Kitchen LORazepam (ATIVAN) 1 MG tablet Take 1 mg by mouth 4 (four) times daily as needed for anxiety.       Marland Kitchen warfarin (COUMADIN) 5 MG tablet Take 7.5 mg by mouth daily at 6 PM.         Assessment: 53 year old woman on warfarin prior admission for a history of DVT/PE.  INR 3.66 today which is above goal of 2-3.   Goal of Therapy:  INR 2-3   Plan:  Will not give warfarin today. Daily protimes.  Candie Mile 08/14/2013,7:38 PM  Addendum:  The combination of simvastatin and clarithromycin in contraindicated.  Contacted Dr. Gordy Levan - order received to stop simvastatin. Heide Guile PharmD

## 2013-08-14 NOTE — H&P (Signed)
[facility] Admission Note Date: 08/14/2013  Patient name: Stacey [name] Medical record number: [id number] Date of birth: September 09, 1960 Age: 53 y.o. Gender: female PCP: Lorelee Market, MD ([place])  Medical Service: Internal Medicine Teaching Service, [place]  Attending physician: Ezzie Dural     Chief Complaint: Chest pressure/"stopping breathing"  History of Present Illness: This is a 53 year old woman with PMH of recent pulmonary embolus now on coumadin (INR 3.66 today), as well as HTN, T2DM (recent A1C 6.1%), OSA, depression, and anxiety who presents complaining of "constant" chest heaviness and pressure, shortness of breath, and severe lightheadedness that have been present and slowly worsening since her admission for PE on 07/15/13. She describes the chest heaviness as 2/10 on the pain scale, located over her sternum as well as bilateral chest around the level of the 4th rib, as well as some soreness in her L shoulder. She denies edema and increasing orthopnea. She denies episodes of syncope. She has had a recent dry cough and some nausea associated with forceful coughing but denies vomiting or abdominal pain; she has chronic diarrhea.  She saw a cardiologist at the [facility] in [place] two days ago because of her symptoms and reports that she was told she needs a cardiac stress test and an echocardiogram; however, the clinic could not schedule her for these "anytime soon." Since her visit to the cardiologist, the patient reports that she has been "stopping breathing" repeatedly and with increasing frequency during sleep and most recently even during wakefulness and that her CPAP is not helping. Last night she began feeling constant, rapid palpitations "every second" while wearing her CPAP, which improved with CPAP removal.   The patient is extremely fearful that something is wrong with her heart and that she will never be able to go to sleep  because of a fear she will stop breathing during sleep. Her hope for this hospital admission is to receive a stress test and an echo as an inpatient and to have her breathing problem "fixed."  Meds: Current Outpatient Rx  Name  Route  Sig  Dispense  Refill  . acetaminophen (TYLENOL) 500 MG tablet   Oral   Take 500 mg by mouth every 6 (six) hours as needed for mild pain.         Marland Kitchen albuterol (PROVENTIL HFA;VENTOLIN HFA) 108 (90 BASE) MCG/ACT inhaler   Inhalation   Inhale 2 puffs into the lungs every 4 (four) hours as needed for wheezing or shortness of breath.   1 Inhaler   0   . amoxicillin-clarithromycin-lansoprazole (PREVPAC) combo pack   Oral   Take 1 each by mouth 2 (two) times daily. Just started on yesterday for stomach/intestine infection. Follow package directions.         Marland Kitchen buPROPion (WELLBUTRIN XL) 300 MG 24 hr tablet   Oral   Take 300 mg by mouth daily.          . busPIRone (BUSPAR) 15 MG tablet   Oral   Take 15 mg by mouth 3 (three) times daily.          Marland Kitchen escitalopram (LEXAPRO) 20 MG tablet   Oral   Take 20 mg by mouth daily.           Marland Kitchen glimepiride (AMARYL) 4 MG tablet   Oral   Take 4 mg by mouth daily with breakfast.         . hydrochlorothiazide (HYDRODIURIL) 25 MG tablet  Oral   Take 25 mg by mouth at bedtime.          Marland Kitchen lisinopril (PRINIVIL,ZESTRIL) 10 MG tablet   Oral   Take 10 mg by mouth daily.         Marland Kitchen loperamide (IMODIUM) 2 MG capsule   Oral   Take 2 mg by mouth as needed for diarrhea or loose stools.          Marland Kitchen LORazepam (ATIVAN) 1 MG tablet   Oral   Take 1 mg by mouth 4 (four) times daily as needed for anxiety.          Marland Kitchen warfarin (COUMADIN) 5 MG tablet   Oral   Take 7.5 mg by mouth daily at 6 PM.            Allergies: Allergies as of 08/14/2013 - Review Complete 08/14/2013  Allergen Reaction Noted  . Nitroglycerin Anxiety 11/07/2011  . Tramadol Anaphylaxis and Swelling 07/15/2013  . Dilaudid [hydromorphone  hcl] Other (See Comments) 02/16/2011  . Levaquin [levofloxacin in d5w] Nausea And Vomiting 02/17/2013  . Trazodone and nefazodone  07/15/2013  . Sulfa antibiotics Rash 02/16/2011   Past Medical History  Diagnosis Date  . Hypertension   . Depression   . Type 2 diabetes mellitus   . Diverticulitis   . Asthma   . Cyst     pt has "cyst" to legs for years, areas drain at time  . Pancreatitis   . Arthritis   . C. difficile colitis 11/08/2011  . Musculoskeletal pain 11/08/2011  . Neuropathic pain 11/10/2011  . Sleep apnea with use of continuous positive airway pressure (CPAP)     uses CPAP at night  . 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 DVT (deep venous thrombosis)    Past Surgical History  Procedure Laterality Date  . Tonsillectomy    . Cholecystectomy    . Cesarean section    . Nose surgery     History reviewed. No pertinent family history. History   Social History  . Marital Status: Divorced    Spouse Name: N/A    Number of Children: N/A  . Years of Education: N/A   Occupational History  . Not on file.   Social History Main Topics  . Smoking status: Never Smoker   . Smokeless tobacco: Never Used  . Alcohol Use: No  . Drug Use: No  . Sexual Activity: Not Currently    Birth Control/ Protection: Abstinence   Other Topics Concern  . Not on file   Social History Narrative  . No narrative on file    Review of Systems: Review of Systems  Constitutional: Positive for weight loss. Negative for fever and diaphoresis.  HENT: Positive for sore throat (mild sore throat). Negative for congestion.   Eyes: Positive for blurred vision (new blurring of vision).  Respiratory: Positive for cough and shortness of breath. Negative for sputum production and wheezing.   Cardiovascular: Positive for chest pain and palpitations. Negative for orthopnea and leg swelling. PND: patient reports that she has always slept sitting up and has had episodes of  "stopping breathing" during sleep.  Gastrointestinal: Positive for nausea, abdominal pain (chronic mild epigastric tenderness, patient reports this has always been present since she had pancreatitis) and diarrhea (chronic). Negative for vomiting and constipation.  Genitourinary: Negative for dysuria, urgency and frequency.  Musculoskeletal: Negative for myalgias.  Skin: Rash: patient has chronic mild skin breakdown on a large melon-sized mass  on her R medial thigh.  Neurological: Positive for dizziness and headaches (patient reports nonspecific pains in her head associated with her chest pressure episodes). Negative for sensory change, focal weakness, loss of consciousness and weakness.  Endo/Heme/Allergies: Bruises/bleeds easily (increased menstrual bleeding compared to usual since starting coumadin).  Psychiatric/Behavioral: Positive for depression. The patient is nervous/anxious and has insomnia.     Physical Exam: Blood pressure 142/58, pulse 80, temperature 97 F (36.1 C), temperature source Oral, resp. rate 16, last menstrual period 08/14/2013, SpO2 100.00%.  Physical Exam: GENERAL: Morbidly obese, very tearful, but in no apparent physical distress. NEURO: Alert, oriented, and cooperative with exam. CN II - XII intact in detail. 5/5 strength in upper and lower extremities. Normal sensation to fine touch.  HEENT: PERLLA. Pharynx mildly erythematous. No other oropharyngeal lesions noted.  CHEST: Normal S1 and S2. No m/r/g appreciated. Lungs CTAB with normal WOB on room air. Chest pain reproducible with manual sternal pressure. ABDOMEN: NABS. Obese, soft, with mild epigastric tenderness but otherwise nontender. EXTREMITIES: 2+ DP/PT pulses bilaterally. Small excoriations (cat scratches per patient) on shins bilaterally. R medial thigh with melon-sized soft tissue mass with skin erythema but no apparent skin breakdown.  Lab results: Results for orders placed during the hospital encounter of  08/14/13 (from the past 24 hour(s))  CBC WITH DIFFERENTIAL     Status: Abnormal   Collection Time    08/14/13 10:49 AM      Result Value Range   WBC 5.5  4.0 - 10.5 K/uL   RBC 4.27  3.87 - 5.11 MIL/uL   Hemoglobin 12.0  12.0 - 15.0 g/dL   HCT 35.0 (*) 36.0 - 46.0 %   MCV 82.0  78.0 - 100.0 fL   MCH 28.1  26.0 - 34.0 pg   MCHC 34.3  30.0 - 36.0 g/dL   RDW 14.0  11.5 - 15.5 %   Platelets 252  150 - 400 K/uL   Neutrophils Relative % 66  43 - 77 %   Neutro Abs 3.7  1.7 - 7.7 K/uL   Lymphocytes Relative 25  12 - 46 %   Lymphs Abs 1.4  0.7 - 4.0 K/uL   Monocytes Relative 7  3 - 12 %   Monocytes Absolute 0.4  0.1 - 1.0 K/uL   Eosinophils Relative 1  0 - 5 %   Eosinophils Absolute 0.1  0.0 - 0.7 K/uL   Basophils Relative 0  0 - 1 %   Basophils Absolute 0.0  0.0 - 0.1 K/uL  PRO B NATRIURETIC PEPTIDE     Status: None   Collection Time    08/14/13 10:49 AM      Result Value Range   Pro B Natriuretic peptide (BNP) 53.1  0 - 125 pg/mL  PROTIME-INR     Status: Abnormal   Collection Time    08/14/13 10:49 AM      Result Value Range   Prothrombin Time 35.0 (*) 11.6 - 15.2 seconds   INR 3.66 (*) 0.00 - 1.49  POCT I-STAT TROPONIN I     Status: None   Collection Time    08/14/13 11:01 AM      Result Value Range   Troponin i, poc 0.00  0.00 - 0.08 ng/mL   Comment 3           POCT I-STAT 3, BLOOD GAS (G3+)     Status: Abnormal   Collection Time    08/14/13  3:31 PM  Result Value Range   pH, Arterial 7.500 (*) 7.350 - 7.450   pCO2 arterial 27.6 (*) 35.0 - 45.0 mmHg   pO2, Arterial 101.0 (*) 80.0 - 100.0 mmHg   Bicarbonate 21.5  20.0 - 24.0 mEq/L   TCO2 22  0 - 100 mmol/L   O2 Saturation 98.0     Acid-base deficit 1.0  0.0 - 2.0 mmol/L   Collection site RADIAL, ALLEN'S TEST ACCEPTABLE     Drawn by Operator     Sample type ARTERIAL      Imaging results:  Dg Chest 2 View  08/14/2013   CLINICAL DATA:  Chest tightness.  Short of breath.  Cough  EXAM: CHEST  2 VIEW  COMPARISON:   08/13/2013  FINDINGS: Cardiac silhouette is borderline enlarged. Normal mediastinal and hilar contours. Clear lungs. No pleural effusion or pneumothorax. The bony thorax is intact.  IMPRESSION: No active cardiopulmonary disease.   Electronically Signed   By: Lajean Manes M.D.   On: 08/14/2013 12:01    Other results: EKG - 1st degree AV block unchanged from prior. No other abnormalities.  Assessment & Plan by Problem:  Chest pressure, SOB, apparent apneic episodes during sleep.    Differential necessarily includes must-not-miss diagnoses such as ACS, aortic dissection, PE, PTX. ACS unlikely given nature of chest pain, normal ECG, negative troponin. Aortic dissection unlikely given nature of chest pain, normal vitals, and not likely given equal BPs in both arms. PE unlikely given that patient is currently supratherapeutic on coumadin, satting 100% on room air with normal HR and RR, and chest pain is non-pleuritic. PTX, pneumonia unlikely given normal CXR. CHF is a possibility, and the patient's description of her "stopping breathing" could be PND, but echo on 12/16 showed normal EF and systolic function with only LV diastolic dysfunction, and pro-BNP at admission was 53.1. She has a normal ECG and no events on tele in ED suggestive of an arrhythmia. Pericarditis is unlikely given description of chest pain, absence of abnl heart sounds, normal ECG, and no constitutional symptoms to suggest viral/inflammatory process. Patient presented with similar symptoms at [facility] cardiology two days ago and Holter monitor, echo, and Lexiscan Myoview stress test were ordered; however, patient is unwilling to wait to be scheduled for these tests.  Given reproducibility of chest pressure on exam, her chest pressure may be musculoskeletal in nature and she may have some residual chest discomfort from PE. Recent dry cough may be exacerbating this chest discomfort. Her OSA may be causing her to wake gasping/choking,  particularly if her CPAP settings have not been adjusted recently. In the absence of a serious cardiac or pulmonary problem, her symptoms are consistent with a panic attack (palpitations, SOB, choking sensation, chest discomfort, nausea, lightheadeness, and intense fear of dying). Somatic symptom disorder must also be considered given her high health care utilization and many ED visits with no organic cause for symptoms found. Certainly her anxiety and the obsessive nature of her thoughts about her cardiac health exacerbate her existing symptoms.  - continue home bupropion 300 mg daily  - continue home buspirone 15 mg TID - continue home escitalopram 20 mg daily - patient reportedly on lorazepam at home; plan to hold lorazepam and provide clonazepam 1 mg TID PRN acute anxiety - patient would benefit from outpatient CBT in addition to her anti-depressant and anti-anxiety medications to treat her obsessive worries about her physical health. - will have RT evaluate for in-hospital CPAP since patient uses CPAP at home. -  will monitor vitals overnight including continuous O2 sat monitoring. - recommend patient follow through with Holter monitoring, stress test, and echo as recommended by Dr. Nehemiah Massed, her cardiologist.  Intermittent nausea/chronic diarrhea. Patient was previously written for triple therapy for H. Pylori but did not complete her course of treatment. Patient may have persistent H. Pylori infection versus irritable bowel syndrome, versus less likely viral gastroenteritis. May also be medication effect with glimepiride but this is less likely given time course. - will re-initiate triple therapy for H. Pylori x 7 days given that FP records show positive H. Pylori antigen and patient has not adhered to her previous course. - lansoprazole 30 mg BID - amoxicillin 1 g BID - clarithromycin 500 mg BID  Hx PE. - continue home dose of coumadin 7.5 mg daily at 6pm  HTN. Patient with systolic BP in  [id number]; reportedly only took lisinopril today. - continue home HCTZ 25 mg daily - continue home lisinopril 10 mg daily  T2DM. - continue home glimepiride 4 mg daily  R leg soft tissue mass. Patient has apparently been evaluated by multiple surgeons who declined intervention. Has appearance of lipoma. Erythema and crusting but no open lesions present. Patient has home health nursing arranged already. - nursing to dress wound for patient's comfort  CAD risk. Patient at risk for CAD given hx intermittent smoking, T2DM, HTN, morbid obesity. Per FP records, recent lipids TC 231, TG 172, HDL 45, LDL 152, VLDL 34. TSH 1.48. - simvastatin 40 mg daily. - cardiology follow up.  FEN/GI: - regular diet - pt euvolemic with normal electrolytes; no IV fluids for now.  PPx: - continue home coumadin - ambulate as tolerated   This is a Careers information officer Note.  The care of the patient was discussed with Dr. Eula Fried and the assessment and plan was formulated with their assistance.  Please see their note for official documentation of the patient encounter.   SignedCorey Skains 08/14/2013, 3:15 PM

## 2013-08-14 NOTE — ED Notes (Signed)
admitting at bedside.  

## 2013-08-14 NOTE — H&P (Signed)
  I have seen and examined the patient, and reviewed the H&P note by Corey Skains, MS 4 and discussed the care of the patient with them. Please see my progress note from 08/14/2013 for further details regarding assessment and plan.    Signed:  Jerene Pitch, MD 08/14/2013, 9:35 PM

## 2013-08-14 NOTE — H&P (Signed)
Date: 08/14/2013               Patient Name:  Stacey [name] MRN: [id number]  DOB: 11/26/1960 Age / Sex: 53 y.o., female   PCP: Lorelee Market, MD         Medical Service: Internal Medicine Teaching Service         Attending Physician: Dr. Sid Falcon, MD    First Contact: Corey Skains, MS4 Pager: [phone]  Second Contact: Dr. Eula Fried Pager: [phone]       After Hours (After 5p/  First Contact Pager: [phone]  weekends / holidays): Second Contact Pager: [phone]   Chief Complaint: Chest pressure, difficulty breathing  History of Present Illness: Ms. 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  She is very tearful during our examination and has been to several doctors offices both back home (in [place]) and to multiple emergency room's for similar complaints.  She says ever since being diagnosed with her PE, she has been having episodes of shortness of breath and mainly at night which leads her to be fearful of falling asleep.  She says she wakes up suddenly from sleep from not breathing and is very concerned. She also reports substernal constant, non-radiating chest pressure x3 weeks along with occasional palpitations (last night many that did not allow her to sleep).  She reports being seen by a cardiologist at [facility] clinic and says she was recommended to get a stress test and an echo, both of which she has been unable to get done as an outpatient.  She says she was told by her doctors office that it would be easier and faster to get admitted to the hospital to get those tests done.  We will need to verify records from the cardiologist and PCP.   Ms. Hendry also reports feeling light-headed at times, some nausea and dry cough, decreased appetite and has been trying to lose weight. She  denies abdominal pain or any urinary complaints.  She reports compliance with her CPAP but expresses frustration that she continues to have these episodes.  She is very fearful that "something is wrong with her heart" and just wants [place] to "fix it".    Of note: last echo 07/2013: technically limited study, Image quality was inadequate but EF 29-92%, LV diastolic dysfunction, mild LVH. Last hospital admission 07/2013 for DOE and CTA with single questionable pulmonary embolus.   Meds: Current Facility-Administered Medications  Medication Dose Route Frequency Provider Last Rate Last Dose  . acetaminophen (TYLENOL) tablet 650 mg  650 mg Oral Q4H PRN Jerene Pitch, MD      . amoxicillin (AMOXIL) capsule 1,000 mg  1,000 mg Oral Q12H Sid Falcon, MD      . Derrill Memo ON 08/15/2013] aspirin EC tablet 81 mg  81 mg Oral Daily Jerene Pitch, MD      . Derrill Memo ON 08/15/2013] buPROPion (WELLBUTRIN XL) 24 hr tablet 300 mg  300 mg Oral Daily Jerene Pitch, MD      . busPIRone (BUSPAR) tablet 15 mg  15 mg Oral TID Jerene Pitch, MD      . clarithromycin [facility]) tablet 500 mg  500 mg Oral Q12H Sid Falcon, MD      . Derrill Memo ON 08/15/2013] escitalopram (LEXAPRO) tablet 20 mg  20  mg Oral Daily Jerene Pitch, MD      . hydrochlorothiazide (HYDRODIURIL) tablet 25 mg  25 mg Oral QHS Jerene Pitch, MD      . Derrill Memo ON 08/15/2013] lisinopril (PRINIVIL,ZESTRIL) tablet 10 mg  10 mg Oral Daily Jerene Pitch, MD      . ondansetron [facility]) injection 4 mg  4 mg Intravenous Q6H PRN Jerene Pitch, MD      . pantoprazole (PROTONIX) EC tablet 40 mg  40 mg Oral BID Sid Falcon, MD      . simvastatin (ZOCOR) tablet 20 mg  20 mg Oral q1800 Jerene Pitch, MD       Facility-Administered Medications Ordered in Other Encounters  Medication Dose Route Frequency Provider Last Rate Last Dose  . HYDROcodone-acetaminophen (NORCO/VICODIN) 5-325 MG per tablet 1 tablet  1 tablet Oral Q6H PRN Radene Gunning, NP         Allergies: Allergies as of 08/14/2013 - Review Complete 08/14/2013  Allergen Reaction Noted  . Nitroglycerin Anxiety 11/07/2011  . Tramadol Anaphylaxis and Swelling 07/15/2013  . Dilaudid [hydromorphone hcl] Other (See Comments) 02/16/2011  . Levaquin [levofloxacin in d5w] Nausea And Vomiting 02/17/2013  . Trazodone and nefazodone  07/15/2013  . Sulfa antibiotics Rash 02/16/2011   Past Medical History  Diagnosis Date  . Hypertension   . Depression   . Type 2 diabetes mellitus   . Diverticulitis   . Asthma   . Cyst     pt has "cyst" to legs for years, areas drain at time  . Pancreatitis   . Arthritis   . C. difficile colitis 11/08/2011  . Musculoskeletal pain 11/08/2011  . Neuropathic pain 11/10/2011  . Sleep apnea with use of continuous positive airway pressure (CPAP)     uses CPAP at night  . 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 DVT (deep venous thrombosis)    Past Surgical History  Procedure Laterality Date  . Tonsillectomy    . Cholecystectomy    . Cesarean section    . Nose surgery     History reviewed. No pertinent family history. History   Social History  . Marital Status: Divorced    Spouse Name: N/A    Number of Children: N/A  . Years of Education: N/A   Occupational History  . Not on file.   Social History Main Topics  . Smoking status: Never Smoker   . Smokeless tobacco: Never Used  . Alcohol Use: No  . Drug Use: No  . Sexual Activity: Not Currently    Birth Control/ Protection: Abstinence   Other Topics Concern  . Not on file   Social History Narrative  . No narrative on file   Review of Systems:  Constitutional:  Decreased appetite and fatigue. Denies fever, chills, diaphoresis.  HEENT:  Denies congestion. Occasional sore throat.   Respiratory:  Cough, SOB, DOE.  Denies wheezing.   Cardiovascular:  Palpitations and chest pressure.   Gastrointestinal:  Nausea, occasional abdominal pain, diarrhea. Denies  vomiting, constipation, blood in stool and abdominal distention.   Genitourinary:  Denies dysuria.   Musculoskeletal:  Gait problem due to obesity and large mass on R medial thigh  Skin:  Large R medial thigh mass.  Neurological:  Light-headedness.    Physical Exam: Blood pressure 174/77, pulse 81, temperature 97.2 F (36.2 C), temperature source Oral, resp. rate 16, last menstrual period 08/14/2013, SpO2 100.00%. Vitals reviewed. General: resting in bed, tearful HEENT: PERRL, EOMI Cardiac: distant  heart sounds but RRR, no murmurs. Tenderness to palpation of substernal area. Pulm: clear to auscultation bilaterally but distant due to body habitus Abd: soft, obese, mild epigastric tenderness, nondistended, BS present Ext: warm and well perfused, +2dp b/l, large R medial thigh soft tissue lesion with dimpling and a clean and dry dressing on top but no visible opening on mass, multiple excoriations on b/l lower extremities (kitten scratch marks), non-tenderness to palpation, tenderness to palpation of b/l lower extremities Neuro: alert and oriented X3, cranial nerves II-XII grossly intact, strength and sensation to light touch equal in bilateral upper and lower extremities  CBC:  Recent Labs  08/14/13 1049  WBC 5.5  NEUTROABS 3.7  HGB 12.0  HCT 35.0*  MCV 82.0  PLT 252   Cardiac Enzymes:  Recent Labs  08/14/13 1838  TROPONINI <0.30   BNP:  Recent Labs  08/14/13 1049  PROBNP 53.1   Fasting Lipid Panel:  Recent Labs  08/14/13 1838  CHOL 245*  HDL 46  LDLCALC 173*  TRIG 132  CHOLHDL 5.3   Coagulation:  Recent Labs  08/14/13 1049  LABPROT 35.0*  INR 3.66*   Imaging results:  Dg Chest 2 View  08/14/2013   CLINICAL DATA:  Chest tightness.  Short of breath.  Cough  EXAM: CHEST  2 VIEW  COMPARISON:  08/13/2013  FINDINGS: Cardiac silhouette is borderline enlarged. Normal mediastinal and hilar contours. Clear lungs. No pleural effusion or pneumothorax. The bony  thorax is intact.  IMPRESSION: No active cardiopulmonary disease.   Electronically Signed   By: Lajean Manes M.D.   On: 08/14/2013 12:01   Other results: EKG: HR 61bpm sinus rhythm, prolonged PR 247ms, non-specific t wave changes with flattening in leads aVL and III  Assessment & Plan by Problem: Principal Problem:   Chest pain, atypical Active Problems:   Heart palpitations   Morbid obesity   Anxiety and depression   HTN (hypertension)   Dyspnea   OSA (obstructive sleep apnea)   Short of breath on exertion   Localized swelling, mass, or lump of right lower extremity   Atypical chest pain  Ms. Templin is a morbidly obese female with PMH of anxiety, HTN, OSA, and DVT/PE on coumadin admitted for chest pain and shortness of breath.  Atypical chest pain--constant chest pressure, substernal x3 weeks. Unlikely ACS but does have risk factors including hypertension and DM2. TIMI 2 (risk 8%), thus will try to rule out ACS. Trop poc negative.  Etiology of chest pain could be secondary to PE and pleuritic in nature (supratherapeutic INR) vs. MSK (tenderness to palpation) vs. Likely large anxiety component contributing to pain as well.  CXR did not show any infiltrate thus, PNA less likely. Aortic dissection seems less likely, no tearing back pain, no syncope, no major discrepancy between blood pressures on both arms and good pulses.  PE unlikely with modified geneva score of 0.  Repeat CTA 08/06/13 showed no evidence of acute pulmonary embolus.  She was recently prescribed PREVPAC for H.Pylori but only took one tablet of amoxicillin yesterday, thus GERD could be contributing to pain as well.  Limited echo study in December 2014.  -admit to tele -AM EKG -cycle CE -ASA 325mg  x1 -unclear if takes ASA daily at home but on coumadin -coumadin per pharmacy, will likely hold today's dose given INR 3.66 -get records from PCP and cardiologist office -may be able to get stress test likely as outpatient,  however, patient really wants it to be done while  admitted. Repeat echo study may not be much different given that last one was technically difficult in part due to body habitus.  -controlling anxiety will be a large component of pain control and discomfort. On ativan qid prn at home but says was told not to take it anymore -consider GI cocktail prn -continue PREVPAC and PPI--will count as day 1 today given that she only took 1 tablet yesterday of amoxicillin -hold statin given interaction with clarithromycin -TSH, Lipid panel. A1c 6.1 06/2013  SOB/?apnea--mainly during sleep. Hx of OSA on CPAP and reports compliance, however may need to have levels titrated.  Obesity is also likely contributing to these episodes along with anxiety/panic.  She does endorse occasional cough and sore throat, but no fever or chills and no wheezing.  Possibly exacerbated in past with viral URI that is now resolving. No hypoxia on admission, o2 sats 100% on room air.  Hx of recent PE on coumadin, lasting effects of PE could be contributing as well.  In no respiratory distress on admission, but tearful.   -ABG--?obesity hypoventilation syndrome and may benefit from BiPAP -continue CPAP QHS -anxiety control, given ativan prn in ED  Nausea and diarrhea--with mild abdominal pain on physical exam with hx of recent H.Pylori infection untreated as per patient.  Recommend starting treatment that will need to be followed up as outpatient.  -will check C diff given recent use of antibiotics -supportive therapy for now -monitor electrolytes -zofran prn nausea  Anxiety and Depression--very tearful during our interaction. Multiple ED visits and doctor visits. Scared of something going wrong with her heart. On Wellbutrin, Buspar, Lexapro, and Ativan at home. Will continue these medications at this time but will hold Ativan given reports of apneic episodes at night.  -continue home doses of Wellbutrin, Buspar, and Lexapro -consider  longer acting agent for anxiety such as klonopin instead of Ativan that she is on at home  Hypertension--BP elevated on admission, likely secondary to anxiety and distress.  Home medications include Lisinopril and HCTZ.  -continue home medications  OSA--on CPAP at home -continue CPAP  PE/DVT--diagnosed December 2014. CTA during that admission revealed questionable filling defect in RLL pulmonary artery.  She was briefly initially on Xarelto but transitioned to Coumadin on last hospital admission. INR on admission supratherapeutic at 3.66 -coumadin per pharmacy -monitor INR -will need to determine length of treatment on coumadin as hx seems unclear on sequence of events and provoking factors, but seems that PE may have been present at time of initial diagnosis of DVT but diagnosed shortly after. She does have history of RLE cellulitis which could be provoking factor, but if unable to determine cause will be longer course.  Will try to obtain records from prior hospitalizations when initially diagnosed and from pcp office. Will need to be followed up as outpatient with PCP closely.  -try to obtain prior records  Large RLE mass--soft, dimpled, erythematous, mildly tender, and growing x5 years per patient. She says it has been evaluated by numerous physicians in the past but no one will remove it. ?lipoma. No open wound on mass but edematous.  -obtain prior records -?surgical eval  Diet: Heart healthy DVT Ppx: on coumadin Dispo: Disposition is deferred at this time, awaiting improvement of current medical problems. Anticipated discharge in approximately 1-2 day(s).   The patient does have a current PCP (Evelene Croon, MD) and does not need an [facility] hospital follow-up appointment after discharge.  The patient does not have transportation limitations that hinder transportation  to clinic appointments.  Signed: Jerene Pitch, MD 08/14/2013, 8:07 PM

## 2013-08-15 ENCOUNTER — Observation Stay (HOSPITAL_COMMUNITY): Payer: Medicaid Other

## 2013-08-15 DIAGNOSIS — E785 Hyperlipidemia, unspecified: Secondary | ICD-10-CM

## 2013-08-15 DIAGNOSIS — R079 Chest pain, unspecified: Secondary | ICD-10-CM

## 2013-08-15 LAB — PULMONARY FUNCTION TEST
DL/VA % pred: 108 %
DL/VA: 5.46 ml/min/mmHg/L
DLCO cor % pred: 103 %
DLCO cor: 27.99 ml/min/mmHg
DLCO unc % pred: 103 %
DLCO unc: 27.99 ml/min/mmHg
FEF 25-75 Post: 3.03 L/sec
FEF 25-75 Pre: 1.79 L/sec
FEF2575-%Change-Post: 69 %
FEF2575-%PRED-POST: 105 %
FEF2575-%Pred-Pre: 62 %
FEV1-%CHANGE-POST: 14 %
FEV1-%PRED-POST: 91 %
FEV1-%Pred-Pre: 79 %
FEV1-PRE: 2.4 L
FEV1-Post: 2.74 L
FEV1FVC-%Change-Post: 6 %
FEV1FVC-%PRED-PRE: 93 %
FEV6-%Change-Post: 7 %
FEV6-%Pred-Post: 93 %
FEV6-%Pred-Pre: 86 %
FEV6-Post: 3.42 L
FEV6-Pre: 3.19 L
FEV6FVC-%Change-Post: 0 %
FEV6FVC-%Pred-Post: 102 %
FEV6FVC-%Pred-Pre: 101 %
FVC-%Change-Post: 7 %
FVC-%Pred-Post: 91 %
FVC-%Pred-Pre: 84 %
FVC-Post: 3.45 L
FVC-Pre: 3.22 L
PRE FEV6/FVC RATIO: 99 %
Post FEV1/FVC ratio: 79 %
Post FEV6/FVC ratio: 99 %
Pre FEV1/FVC ratio: 74 %
RV % PRED: 51 %
RV: 0.97 L
TLC % pred: 78 %
TLC: 4.19 L

## 2013-08-15 LAB — COMPREHENSIVE METABOLIC PANEL
ALT: 28 U/L (ref 0–35)
AST: 26 U/L (ref 0–37)
Albumin: 3.7 g/dL (ref 3.5–5.2)
Alkaline Phosphatase: 69 U/L (ref 39–117)
BILIRUBIN TOTAL: 0.6 mg/dL (ref 0.3–1.2)
BUN: 14 mg/dL (ref 6–23)
CHLORIDE: 102 meq/L (ref 96–112)
CO2: 20 meq/L (ref 19–32)
CREATININE: 1.12 mg/dL — AB (ref 0.50–1.10)
Calcium: 9.7 mg/dL (ref 8.4–10.5)
GFR calc Af Amer: 65 mL/min — ABNORMAL LOW (ref >90)
GFR, EST NON AFRICAN AMERICAN: 56 mL/min — AB (ref >90)
Glucose, Bld: 94 mg/dL (ref 70–99)
Potassium: 3.6 mEq/L — ABNORMAL LOW (ref 3.7–5.3)
Sodium: 139 mEq/L (ref 137–147)
Total Protein: 6.9 g/dL (ref 6.0–8.3)

## 2013-08-15 LAB — BASIC METABOLIC PANEL
BUN: 15 mg/dL (ref 6–23)
CO2: 23 mEq/L (ref 19–32)
Calcium: 9.6 mg/dL (ref 8.4–10.5)
Chloride: 100 mEq/L (ref 96–112)
Creatinine, Ser: 1.06 mg/dL (ref 0.50–1.10)
GFR, EST AFRICAN AMERICAN: 70 mL/min — AB (ref >90)
GFR, EST NON AFRICAN AMERICAN: 60 mL/min — AB (ref >90)
Glucose, Bld: 85 mg/dL (ref 70–99)
POTASSIUM: 3.8 meq/L (ref 3.7–5.3)
Sodium: 137 mEq/L (ref 137–147)

## 2013-08-15 LAB — HIV ANTIBODY (ROUTINE TESTING W REFLEX): HIV: NONREACTIVE

## 2013-08-15 LAB — CLOSTRIDIUM DIFFICILE BY PCR: Toxigenic C. Difficile by PCR: POSITIVE — AB

## 2013-08-15 LAB — TROPONIN I: Troponin I: <0.3 ng/mL (ref <0.30)

## 2013-08-15 LAB — PROTIME-INR
INR: 4.32 — ABNORMAL HIGH (ref 0.00–1.49)
Prothrombin Time: 39.7 seconds — ABNORMAL HIGH (ref 11.6–15.2)

## 2013-08-15 MED ORDER — ALBUTEROL SULFATE (2.5 MG/3ML) 0.083% IN NEBU
2.5000 mg | INHALATION_SOLUTION | Freq: Once | RESPIRATORY_TRACT | Status: AC
Start: 2013-08-15 — End: 2013-08-15
  Administered 2013-08-15: 2.5 mg via RESPIRATORY_TRACT

## 2013-08-15 MED ORDER — CLARITHROMYCIN 500 MG PO TABS: 500.0000 mg | ORAL_TABLET | Freq: Two times a day (BID) | ORAL | Status: DC

## 2013-08-15 MED ORDER — BUSPIRONE HCL 15 MG PO TABS
30.0000 mg | ORAL_TABLET | Freq: Two times a day (BID) | ORAL | Status: DC
  Filled 2013-08-15 (×2): qty 2

## 2013-08-15 MED ORDER — METRONIDAZOLE 500 MG PO TABS
500.0000 mg | ORAL_TABLET | Freq: Three times a day (TID) | ORAL | Status: DC
  Administered 2013-08-15: 500 mg via ORAL
  Filled 2013-08-15 (×3): qty 1

## 2013-08-15 MED ORDER — WARFARIN - PHARMACIST DOSING INPATIENT: Freq: Every day | Status: DC

## 2013-08-15 MED ORDER — PANTOPRAZOLE SODIUM 40 MG PO TBEC: 40.0000 mg | DELAYED_RELEASE_TABLET | Freq: Two times a day (BID) | ORAL | Status: DC

## 2013-08-15 MED ORDER — BUSPIRONE HCL 15 MG PO TABS: 30.0000 mg | ORAL_TABLET | Freq: Two times a day (BID) | ORAL | Status: AC

## 2013-08-15 MED ORDER — LORAZEPAM 1 MG PO TABS
1.0000 mg | ORAL_TABLET | Freq: Four times a day (QID) | ORAL | Status: DC | PRN
  Administered 2013-08-15: 1 mg via ORAL
  Filled 2013-08-15: qty 1

## 2013-08-15 MED ORDER — METRONIDAZOLE 500 MG PO TABS: 500.0000 mg | ORAL_TABLET | Freq: Three times a day (TID) | ORAL | Status: DC

## 2013-08-15 NOTE — Progress Notes (Signed)
Patient given discharge instructions and discharged home with family with no further concerns or questions at this time. Reminded of follow-up appointment and medications to continue and begin taking.

## 2013-08-15 NOTE — Progress Notes (Signed)
Subjective and Key labs:  - Patient used CPAP overnight and was able to sleep overnight without feeling of  "stopping breathing". No fever or chills. Chest pain improved, but still has chest pressure. She is still very nervous and is concerned that she could stop breathing when she falls a sleep. She hs dizziness -Trop negative X 2 -Cre 1.06-->1.12. - INR: 3.66-->4.32.  - C diff pcr positive  Objective: Vital signs in last 24 hours: Filed Vitals:   08/14/13 2218 08/14/13 2343 08/15/13 0000 08/15/13 0500  BP: 192/105   104/47  Pulse: 72 61  65  Temp:    98.4 F (36.9 C)  TempSrc:      Resp:  16  18  Height:   5\' 6"  (1.676 m)   Weight:    398 lb 9.6 oz (180.804 kg)  SpO2: 97% 99%  100%   Weight change:   Intake/Output Summary (Last 24 hours) at 08/15/13 0920 Last data filed at 08/14/13 2300  Gross per 24 hour  Intake    240 ml  Output      0 ml  Net    240 ml   General: resting in bed, tearful HEENT: PERRL, EOMI Cardiac: distant heart sounds but RRR, no murmurs. Tenderness to palpation of substernal area. Pulm: clear to auscultation bilaterally but distant due to body habitus Abd: soft, obese, mild epigastric tenderness, nondistended, BS present Ext: warm and well perfused, +2 dp b/l, large R medial thigh soft tissue lesion with dimpling and a clean and dry dressing on top but no visible opening on mass, multiple excoriations on b/l lower extremities (kitten scratch marks), non-tenderness to palpation, tenderness to palpation of b/l lower extremities Neuro: alert and oriented X3, cranial nerves II-XII grossly intact, strength and sensation to light touch equal in bilateral upper and lower extremities  Lab Results: Basic Metabolic Panel:  Recent Labs Lab 08/15/13 0020 08/15/13 0520  NA 137 139  K 3.8 3.6*  CL 100 102  CO2 23 20  GLUCOSE 85 94  BUN 15 14  CREATININE 1.06 1.12*  CALCIUM 9.6 9.7   Liver Function Tests:  Recent Labs Lab 08/15/13 0520  AST 26  ALT  28  ALKPHOS 69  BILITOT 0.6  PROT 6.9  ALBUMIN 3.7   No results found for this basename: LIPASE, AMYLASE,  in the last 168 hours No results found for this basename: AMMONIA,  in the last 168 hours CBC:  Recent Labs Lab 08/14/13 1049  WBC 5.5  NEUTROABS 3.7  HGB 12.0  HCT 35.0*  MCV 82.0  PLT 252   Cardiac Enzymes:  Recent Labs Lab 08/14/13 1838 08/15/13 0020  TROPONINI <0.30 <0.30   BNP:  Recent Labs Lab 08/14/13 1049  PROBNP 53.1   D-Dimer: No results found for this basename: DDIMER,  in the last 168 hours CBG: No results found for this basename: GLUCAP,  in the last 168 hours Hemoglobin A1C: No results found for this basename: HGBA1C,  in the last 168 hours Fasting Lipid Panel:  Recent Labs Lab 08/14/13 1838  CHOL 245*  HDL 46  LDLCALC 173*  TRIG 132  CHOLHDL 5.3   Thyroid Function Tests:  Recent Labs Lab 08/14/13 1838  TSH 0.912   Coagulation:  Recent Labs Lab 08/14/13 1049 08/15/13 0520  LABPROT 35.0* 39.7*  INR 3.66* 4.32*   Anemia Panel: No results found for this basename: VITAMINB12, FOLATE, FERRITIN, TIBC, IRON, RETICCTPCT,  in the last 168 hours Urine Drug Screen: Drugs  of Abuse  No results found for this basename: labopia, cocainscrnur, labbenz, amphetmu, thcu, labbarb    Alcohol Level: No results found for this basename: ETH,  in the last 168 hours Urinalysis: No results found for this basename: COLORURINE, APPERANCEUR, LABSPEC, [place], GLUCOSEU, HGBUR, BILIRUBINUR, KETONESUR, PROTEINUR, UROBILINOGEN, NITRITE, LEUKOCYTESUR,  in the last 168 hours Misc. Labs:  Micro Results: No results found for this or any previous visit (from the past 240 hour(s)). Studies/Results: Dg Chest 2 View  08/14/2013   CLINICAL DATA:  Chest tightness.  Short of breath.  Cough  EXAM: CHEST  2 VIEW  COMPARISON:  08/13/2013  FINDINGS: Cardiac silhouette is borderline enlarged. Normal mediastinal and hilar contours. Clear lungs. No pleural effusion  or pneumothorax. The bony thorax is intact.  IMPRESSION: No active cardiopulmonary disease.   Electronically Signed   By: Lajean Manes M.D.   On: 08/14/2013 12:01   Medications: Scheduled Meds: . amoxicillin  1,000 mg Oral Q12H  . buPROPion  300 mg Oral Daily  . busPIRone  15 mg Oral TID  . clarithromycin  500 mg Oral Q12H  . escitalopram  20 mg Oral Daily  . hydrochlorothiazide  25 mg Oral QHS  . lisinopril  10 mg Oral Daily  . pantoprazole  40 mg Oral BID   Continuous Infusions:  PRN Meds:.acetaminophen, LORazepam, ondansetron (ZOFRAN) IV Assessment/Plan:   Atypical chest pain-- Improved. Unlikely ACS but pt does have risk factors including hypertension and DM2. TIMI 2 (risk 8%). Trop negative X 2 and negative poc trop X 1. It is likely due to the combination of H. Pylori infection and anxiety. Other DD include, but unlikely, PE (INR supratherapeutic), PNA (negative CXR). INR 4.32 today. TSH 0.91, LDD 173  -continue amoxicillin and erythromycin -coumadin per pharmacy, will likely hold today's dose given INR 4.32 -controlling anxiety, will increase the dose of Buspar to 30 mg bid.  -continue PPI -hold statin given interaction with clarithromycin  SOB/?apnea--mainly during sleep. Hx of OSA on CPAP and reports compliance, however may need to have levels titrated.  Obesity is also likely contributing to these episodes along with anxiety/panic. The patient oxygen saturation is normal at 100% on room. Patient used CPAP overnight and was able to sleep overnight without feeling of  "stopping breathing".  - will repeat PFT today -continue CPAP QHS -anxiety control as above.  Nausea and diarrhea-- likely due to the combination of C diff colitis and H. Pylori infection -will switch amoxicillin to oral metronidazole -supportive therapy for now -monitor electrolytes -zofran prn nausea  Anxiety and Depression--very anxious and concerned that she could stop breathing when she falls a sleep.  On Wellbutrin, Buspar, Lexapro, and Ativan at home.   - will hold Ativan given reports of apneic episodes at night.   -will increase dose of Buspar to 30 mg bid.  -continue home doses of Wellbutrin and Lexapro  Hypertension--BP elevated on admission, likely secondary to anxiety and distress.  Home medications include Lisinopril and HCTZ. Cre is slightly up from 1.06-->1.12.  -continue home medications -f/u renal function by BMP  OSA--on CPAP at home -continue CPAP  PE/DVT--diagnosed December 2014. CTA during that admission revealed questionable filling defect in RLL pulmonary artery.  She was briefly initially on Xarelto but transitioned to Coumadin on last hospital admission. INR on admission supratherapeutic at 3.66-->4.32 today -coumadin per pharmacy, likely to hold dose today -monitor INR  Large RLE mass--soft, dimpled, erythematous, mildly tender, and growing x5 years per patient. She says it has  been evaluated by numerous physicians in the past but no one will remove it. ?lipoma. No open wound on mass but edematous.   -obtain prior records -?surgical eval  Diet: Heart healthy DVT Ppx: on coumadin Dispo: discharge home tody   The patient does have a current PCP (Lorelee Market, MD) and does not need an [facility] hospital follow-up appointment after discharge.  The patient does not have transportation limitations that hinder transportation to clinic appointments.    LOS: 1 day   Ivor Costa 08/15/2013, 9:20 AM

## 2013-08-15 NOTE — H&P (Signed)
Date: 08/15/2013  Patient name: Stacey [name]  Medical record number: [id number]  Date of birth: [birth year]   I have seen and evaluated [name] and discussed their care with the Residency Team.  Briefly, Ms. Silverio is a 53yo woman with PMH of DM2, HTN, OSA on CPAP with good results, depression, anxiety and recent DVT with PE who presents for chest pressure.  She described the sensation to me as palpitations, leading to chest pressure and SOB with nausea.  These episodes were sudden and developed as she was trying to fall asleep wearing her CPAP mask.  She sleeps sitting on the cough.  She reports they have been happening since having the PE, but mostly over the last 2 days.  She also has daytime events where she is wide awake and she feels that she cannot breathe.  She is not very mobile (only rising from the couch to go to the bathroom per the patient) and they usually occur at rest.  She has been to multiple several doctors for evaluation, most recently her Cardiologist at [facility] clinic who recommended outpatient stress test and echo.  She was tearful during exam and fearful that something bad was happening to her.  She notes that she had palpitations "many years ago" but that these recent ones seem to be different as they are associated with SOB.  Last hospital admission was 07/2013 where she was found to have a PE on CTA (questionable PE).  On exam, she was resting in bed, speaking in full sentences, but tearful at times.  She had distant heart sounds, lungs were clear, no wheezing, Abdomen obese soft and non tender.  Ext warm, with large r medial thigh soft tissue swelling, non ttp.  Her labs showed an elevated LDL and TCH.  Telemetry was reviewed and showed one episode of SVT overnight.  She denies any sensation of throat closing, difficulty swallowing, heartburn.   Assessment and Plan: I have seen and evaluated the patient as outlined above. I agree with the formulated Assessment and  Plan as detailed in the residents' admission note, with the following changes:   1. Atypical chest pain, palpitations with SOB, and possible apnea: Differential includes malfunctioning CPAP machine (patient reports having same one for many years) vs. Worsening OSA vs. OHS, vs. MSK in nature vs. PE (repeat CT scan of her chest in January showed no PE and INR supratherapeutic) vs. Deconditioning vs. Anxiety.  She has reported to our medical student that she has high anxiety and fear of medical situations.  She further has been fearful since being diagnosed with the PE that she is going to die.  She was previously on BZDs, but these were held given that they "messed" with her breathing per the patient.  I think she is less likely to have ACS.  She will be admitted for telemetry, CE cycle, possible stress test if deemed necessary and uptitration of anti-anxiety medications.  I think she would benefit from ABG evaluation (for daytime hypercapnia) and possibly PFTs as this would check for obstructive causes of her breathing issues.  She would also benefit from an outpatient sleep study to check her machine and titrate her settings as needed.  Consider GI sources as well and the team gave her a GI cocktail.    2. Nausea and Diarrhea: She was previously diagnosed with H.pylori, but apparently was not taking the medications correctly.  Cdiff will be checked and medications will be reinitiated.  Zofran PRN.  3. Anxiety and depression: Would encourage team to analyze her anti-anxiety/depression meds and uptitrate as needed.  She also needs to reestablish with a PCP.   4. PE, DVT, supratherapeutic INR: Coumadin by pharmacy consult.  Will likely need to hold coumadin for 1-2 days and restart.  Will need monitoring outpatient.  5. Obesity/deconditioning: PE evaluation  Other issues per resident note.   Sid Falcon, MD 1/15/20153:08 PM

## 2013-08-15 NOTE — Progress Notes (Signed)
[facility]  Patient Status: Active (receiving services up to time of hospitalization)  [facility] is providing the following services: RN  If patient discharges after hours, please call [phone].   Stacey [name] 08/15/2013, 10:14 AM

## 2013-08-15 NOTE — Progress Notes (Signed)
Medical Student Daily Progress Note  53 year old woman with PMH of recent pulmonary embolus now on coumadin (INR 3.66 today), as well as HTN, T2DM (recent A1C 6.1%), OSA, depression, and anxiety, admitted for atypical chest pain and episodes of "stopping breathing".   Subjective: Patient says her chest pressure and dizziness have improved somewhat today and that she was able to sleep overnight without any of the "stopping breathing" episodes she complained of at home. She is still exceedingly anxious about the possibility that something is wrong with her heart, despite many assurances to the contrary. She does endorse feeling overly anxious and is interested in modifications to her anti-anxiety medications.    Objective: Vital signs in last 24 hours: Filed Vitals:   08/14/13 2343 08/15/13 0000 08/15/13 0500 08/15/13 0945  BP:   104/47   Pulse: 61  65 76  Temp:   98.4 F (36.9 C) 98.2 F (36.8 C)  TempSrc:    Oral  Resp: 16  18 14   Height:  5\' 6"  (1.676 m)    Weight:   180.804 kg (398 lb 9.6 oz)   SpO2: 99%  100% 97%   Filed Weights   08/15/13 0500  Weight: 180.804 kg (398 lb 9.6 oz)     Intake/Output Summary (Last 24 hours) at 08/15/13 1150 Last data filed at 08/15/13 1000  Gross per 24 hour  Intake    720 ml  Output      0 ml  Net    720 ml    Physical Exam: GENERAL: reclining in bed, anxious but in no physical distress NEURO: alert and oriented, cooperative but difficult to reassure CHEST: heart sounds distant but RRR, normal S1/S2. Lung sounds CTAB ABDOMEN: soft, obese, NT/ND EXTREMITIES: trace pitting edema bilaterally. No change in appearance of R thigh soft tissue mass or bilateral LE excoriations.   Lab Results: Results for orders placed during the hospital encounter of 08/14/13 (from the past 24 hour(s))  POCT I-STAT 3, BLOOD GAS (G3+)     Status: Abnormal   Collection Time    08/14/13  3:31 PM      Result Value Range   pH, Arterial 7.500 (*) 7.350 - 7.450    pCO2 arterial 27.6 (*) 35.0 - 45.0 mmHg   pO2, Arterial 101.0 (*) 80.0 - 100.0 mmHg   Bicarbonate 21.5  20.0 - 24.0 mEq/L   TCO2 22  0 - 100 mmol/L   O2 Saturation 98.0     Acid-base deficit 1.0  0.0 - 2.0 mmol/L   Collection site RADIAL, ALLEN'S TEST ACCEPTABLE     Drawn by Operator     Sample type ARTERIAL    TROPONIN I     Status: None   Collection Time    08/14/13  6:38 PM      Result Value Range   Troponin I <0.30  <0.30 ng/mL  TSH     Status: None   Collection Time    08/14/13  6:38 PM      Result Value Range   TSH 0.912  0.350 - 4.500 uIU/mL  LIPID PANEL     Status: Abnormal   Collection Time    08/14/13  6:38 PM      Result Value Range   Cholesterol 245 (*) 0 - 200 mg/dL   Triglycerides 132  <150 mg/dL   HDL 46  >39 mg/dL   Total CHOL/HDL Ratio 5.3     VLDL 26  0 - 40 mg/dL  LDL Cholesterol 173 (*) 0 - 99 mg/dL  TROPONIN I     Status: None   Collection Time    08/15/13 12:20 AM      Result Value Range   Troponin I <0.30  <0.30 ng/mL  BASIC METABOLIC PANEL     Status: Abnormal   Collection Time    08/15/13 12:20 AM      Result Value Range   Sodium 137  137 - 147 mEq/L   Potassium 3.8  3.7 - 5.3 mEq/L   Chloride 100  96 - 112 mEq/L   CO2 23  19 - 32 mEq/L   Glucose, Bld 85  70 - 99 mg/dL   BUN 15  6 - 23 mg/dL   Creatinine, Ser 1.06  0.50 - 1.10 mg/dL   Calcium 9.6  8.4 - 10.5 mg/dL   GFR calc non Af Amer 60 (*) >90 mL/min   GFR calc Af Amer 70 (*) >90 mL/min  PROTIME-INR     Status: Abnormal   Collection Time    08/15/13  5:20 AM      Result Value Range   Prothrombin Time 39.7 (*) 11.6 - 15.2 seconds   INR 4.32 (*) 0.00 - 1.49  COMPREHENSIVE METABOLIC PANEL     Status: Abnormal   Collection Time    08/15/13  5:20 AM      Result Value Range   Sodium 139  137 - 147 mEq/L   Potassium 3.6 (*) 3.7 - 5.3 mEq/L   Chloride 102  96 - 112 mEq/L   CO2 20  19 - 32 mEq/L   Glucose, Bld 94  70 - 99 mg/dL   BUN 14  6 - 23 mg/dL   Creatinine, Ser 1.12 (*)  0.50 - 1.10 mg/dL   Calcium 9.7  8.4 - 10.5 mg/dL   Total Protein 6.9  6.0 - 8.3 g/dL   Albumin 3.7  3.5 - 5.2 g/dL   AST 26  0 - 37 U/L   ALT 28  0 - 35 U/L   Alkaline Phosphatase 69  39 - 117 U/L   Total Bilirubin 0.6  0.3 - 1.2 mg/dL   GFR calc non Af Amer 56 (*) >90 mL/min   GFR calc Af Amer 65 (*) >90 mL/min  HIV ANTIBODY (ROUTINE TESTING)     Status: None   Collection Time    08/15/13  5:20 AM      Result Value Range   HIV NON REACTIVE  NON REACTIVE    Micro Results: No results found for this or any previous visit (from the past 240 hour(s)).  Studies/Results: Dg Chest 2 View  08/14/2013   CLINICAL DATA:  Chest tightness.  Short of breath.  Cough  EXAM: CHEST  2 VIEW  COMPARISON:  08/13/2013  FINDINGS: Cardiac silhouette is borderline enlarged. Normal mediastinal and hilar contours. Clear lungs. No pleural effusion or pneumothorax. The bony thorax is intact.  IMPRESSION: No active cardiopulmonary disease.   Electronically Signed   By: Lajean Manes M.D.   On: 08/14/2013 12:01    Medications:  Scheduled Meds: . amoxicillin  1,000 mg Oral Q12H  . buPROPion  300 mg Oral Daily  . busPIRone  30 mg Oral BID  . clarithromycin  500 mg Oral Q12H  . escitalopram  20 mg Oral Daily  . hydrochlorothiazide  25 mg Oral QHS  . lisinopril  10 mg Oral Daily  . pantoprazole  40 mg Oral BID  . Warfarin -  Pharmacist Dosing Inpatient   Does not apply q1800   Continuous Infusions:  PRN Meds:.acetaminophen, LORazepam, ondansetron (ZOFRAN) IV  Assessment/Plan:  Chest pressure, SOB, apparent apneic episodes during sleep.  Must-not-miss diagnoses such as ACS, aortic dissection, PE, PTX have been ruled out; see H&P. Troponins neg x 3. No tele events overnight. Patient feels somewhat better this morning and agrees that her anxiety is a significant contributor to her clinical situation. Will get PFTs to assess for presence of true restrictive or obstructive lung disease; patient likely has some  degree of restrictive pattern given her body habitus. She is interested in having her psychiatric medications adjusted to help with this. Given that she slept fine without any "stopping breathing" episodes or episodes of desaturation here on CPAP, she likely needs to have her CPAP adjusted as it may be malfunctioning, and will need to follow up with her PCP to arrange this in the outpatient setting. Patient also needs to follow up at [facility] cardiology for Holter monitor, echo, and Lexiscan Myoview stress test as recommended by her cardiologist.  - continue home bupropion 300 mg daily  - increase buspirone to 30 mg BID  - continue home escitalopram 20 mg daily  - stop lorazepam given likelihood of respiratory depression and patient refusal to take. - patient would benefit from outpatient CBT in addition to her anti-depressant and anti-anxiety medications to treat her obsessive worries about her physical health. Recommend for PCP to arrange this. - PFTs   Intermittent nausea/chronic diarrhea. Patient was previously written for triple therapy for H. Pylori but did not complete her course of treatment. Patient may have persistent H. Pylori infection versus irritable bowel syndrome, versus less likely viral gastroenteritis. May also be medication effect with glimepiride but this is less likely given time course.  - will re-initiate triple therapy for H. Pylori x 7 days given that FP records show positive H. Pylori antigen and patient has not adhered to her previous course.  - lansoprazole 30 mg BID  - amoxicillin 1 g BID  - clarithromycin 500 mg BID   Hx PE.  - holding coumadin given supratherapeutic INR. - Patient to follow up Monday 1/19 at [facility] for INR check.  HTN. Pt hypertensive to 190s overnight, improved with HCTZ  - continue home HCTZ 25 mg daily  - continue home lisinopril 10 mg daily   T2DM.  - continue home glimepiride 4 mg daily   R leg soft tissue mass.  Patient has apparently been evaluated by multiple surgeons who declined intervention. Has appearance of lipoma. Erythema and crusting but no open lesions present. Patient has home health nursing arranged already.  - nursing to dress wound for patient's comfort   CAD risk/HLD. Patient at risk for CAD given hx intermittent smoking, T2DM, HTN, morbid obesity, HLD. - holding statin temporarily due to interaction with clarithromycin. - cardiology follow up.   FEN/GI:  - regular diet    PPx:  - INR supratherapeutic; holding coumadin - ambulate as tolerated  Dispo: pending PT evaluation. Possibly to inpt rehab/SNF vs home with home health PT and nursing.   LOS: 1 day   This is a Careers information officer Note.  The care of the patient was discussed with Dr. Blaine Hamper and the assessment and plan formulated with their assistance.  Please see their attached note for official documentation of the daily encounter.  Stacey [name] 08/15/2013, 11:50 AM

## 2013-08-15 NOTE — Progress Notes (Signed)
[facility] for warfarin Indication: History of DVT/PE - on prior to admission  Allergies  Allergen Reactions  . Nitroglycerin Anxiety  . Tramadol Anaphylaxis and Swelling  . Dilaudid [Hydromorphone Hcl] Other (See Comments)    Hallucination  . Levaquin [Levofloxacin In D5w] Nausea And Vomiting  . Trazodone And Nefazodone   . Sulfa Antibiotics Rash     Vital Signs: Temp: 98.2 F (36.8 C) (01/15 0945) Temp src: Oral (01/15 0945) BP: 104/47 mmHg (01/15 0500) Pulse Rate: 76 (01/15 0945)  Labs:  Recent Labs  08/14/13 1049 08/14/13 1838 08/15/13 0020 08/15/13 0520  HGB 12.0  --   --   --   HCT 35.0*  --   --   --   PLT 252  --   --   --   LABPROT 35.0*  --   --  39.7*  INR 3.66*  --   --  4.32*  CREATININE  --   --  1.06 1.12*  TROPONINI  --  <0.30 <0.30  --     Estimated Creatinine Clearance: 102.4 ml/min (by C-G formula based on Cr of 1.12).   Assessment: 53 year old woman on warfarin prior admission for a history of DVT/PE.  INR elevated on admission, still elevated today  Goal of Therapy:  INR 2-3   Plan:  No Coumadin today Daily protimes.   Thank you. Anette Guarneri, PharmD [phone]  Tad Moore 08/15/2013,11:14 AM

## 2013-08-15 NOTE — Discharge Instructions (Signed)
What to do after you leave the hospital:  Recommended diet: low fat, low cholesterol diet  Recommended activity: activity as tolerated  Follow-up with [facility] on Monday, 1/19 at 11am for INR check and kidney function recheck. Please work with your primary care provider to arrange a sleep study.  Follow-up with your cardiologist Dr. Nehemiah Massed at the [facility] as scheduled. If you do not have an appointment, please call to schedule one.   START TAKING: Clarithromycin 500 mg (1 tablet) twice daily. Metronidazole 500 mg (1 tablet) three times daily. Pantoprazole 40 mg (1 tablet) twice daily.  CHANGE YOUR DOSE OF: Buspirone (Buspar): you should now take two 15 mg tablets twice daily. (for a total of 30 mg per dose). Coumadin: Do not take your coumadin on Thursday 1/15 or Friday 1/16. Please RESUME taking coumadin on Saturday 1/17, but ONLY TAKE 5 mg per day (1 pill) at 6pm each day.  CONTINUE THESE MEDICATIONS AT YOUR PREVIOUS DOSE: Acetaminophen Albuterol Buproprion Escitalopram Glimepiride Hydrochlorothiazide (HCTZ) Lisinopril   Chest Pain Observation It is often hard to give a specific diagnosis for the cause of chest pain. Blood tests, electrocardiograms, and X-rays have been done to help determine a possible cause of your chest pain. After evaluation and observation, your health care provider has determined that it is very unlikely your pain was caused by an unstable heart or lung condition that requires hospitalization. However, additional diagnostic testing may be needed as directed by your cardiologist. It is very important to keep your follow-up appointments. If there is any problem keeping your follow-up appointments, you must call your health care provider. HOME CARE INSTRUCTIONS  It is important to follow your health care provider's treatment plan and also maintain a healthy lifestyle:  Maintain or work toward achieving a healthy weight.  Stay  physically active and exercise regularly.  Decrease your salt intake.  Eat a balanced, healthy diet. Talk to a dietician to learn about heart healthy foods.  Increase your fiber intake by including whole grains, vegetables, fruits, and nuts in your diet.  Avoid situations that cause stress, anger, or depression.  Take medicines as advised by your health care provider. Report any side effects to your health care provider. Do not stop medicines or adjust the dosages on your own.  Quit smoking. Do not use nicotine patches or gum until you check with your health care provider.  Keep your blood pressure, blood sugar, and cholesterol levels within normal limits.  Limit alcohol intake to no more than 1 drink per day for women that are not pregnant and 2 drinks per day for men.  Do not abuse drugs. SEEK IMMEDIATE MEDICAL CARE IF: You have severe chest pain or pressure which may include symptoms such as:  You feel pain or pressure in you arms, neck, jaw, or back.  You have severe back or abdominal pain, feel sick to your stomach (nauseous), or throw up (vomit).  You are sweating profusely.  You are having a persistently fast or irregular heartbeat.  You feel short of breath while at rest.  You notice increasing shortness of breath during rest, sleep, or with activity.  You have severe chest pain that does not get better after rest or after taking your usual medicine.  You wake from sleep with chest pain.  You are coughing up blood.  You develop severe weakness, dizziness, fainting, or chills.   Any of these symptoms may represent a serious problem that is an emergency. Do not  wait to see if the symptoms will go away. Call your local emergency services (911 in the [place] Do not drive yourself to the hospital. Document Released: 08/20/2010 Document Revised: 03/20/2013 Document Reviewed: 01/17/2013 [facility] Patient Information 2014 [place].    Additional primary care  options near [place], [place]:  [facility] [address] [place], [place] [address] [phone]  [facility] [place], [place] [address] [phone]  [facility] [address] [place],  [address] [phone]  [place] to be arranged with [facility]. [phone]. Registered Nurse

## 2013-08-15 NOTE — Progress Notes (Signed)
Physical Therapy Evaluation Patient Details Name: Stacey [name] MRN: [id number] DOB: 09-04-1960 Today's Date: 08/15/2013 Time: 0240-9735 PT Time Calculation (min): 22 min  PT Assessment / Plan / Recommendation History of Present Illness  Stacey [name] is a 52yo woman with PMH of DM2, HTN, OSA on CPAP with good results, depression, anxiety and recent DVT with PE who presents for chest pressure, atypical chest pain, palpitations with SOB, and possible apnea. Repeat CT shows neg PE and INR supratherapeutic. Also +Cdiff.  Clinical Impression  Pt admitted with above. Pt currently with functional limitations due to the deficits listed below (see PT Problem List). Patient will be much safer at home with RW vs single point cane.  Education provided on starting a general strengthening HEP, starting a exercise plan of walking/water aerobics as long as the doctor clears her for exercise.  Made clear to patient that she needs to discuss with doctor before she starts any exercise plan.  Pt will continue to be followed in acute setting as she will benefit from skilled PT to increase their independence and safety with mobility to allow discharge to the venue listed below.       PT Assessment  Patient needs continued PT services    Follow Up Recommendations  Home health PT (Per case manager,isn't feasible due to insurance coverage)          Equipment Recommendations  Rolling walker with 5" wheels (Bariatric)       Frequency Min 3X/week    Precautions / Restrictions Precautions Precautions: Fall Restrictions Weight Bearing Restrictions: No   Pertinent Vitals/Pain None reported, SpO2 99% throughout exercise. HR 90 bpm at start of session, 110 after 10 feet, 126 once she walked back to her bed.      Mobility  Bed Mobility Overal bed mobility: Modified Independent General bed mobility comments: Requires bed rails to pull with her UE Transfers Overall transfer level: Modified  independent Equipment used: Straight cane General transfer comment: Uses both hands to push through cane, uses a lot of momentum to achieve standing Ambulation/Gait Ambulation/Gait assistance: Min guard Ambulation Distance (Feet): 30 Feet Assistive device: Rolling walker (2 wheeled);Straight cane (Bariatric) Gait Pattern/deviations: Decreased stride length;Staggering right;Staggering left General Gait Details: Uses excessive hip hiking to clear her feet during ambulation.  She uses both hands for support on her cane during first part of walking. She is much more stable during walking with the RW.  Her gait pattern is more normal and she has a much better sense of support and balance with the RW.  Education provided that she is safer and more efficient with RW. Forward flexed posture with wide BOS to avoid pannus while walking.    Exercises  Education and handout provided on HEP once cleared by doctor.   PT Diagnosis: Difficulty walking;Generalized weakness  PT Problem List: Decreased strength;Decreased activity tolerance;Decreased balance;Decreased mobility;Decreased cognition;Decreased knowledge of use of DME;Decreased knowledge of precautions PT Treatment Interventions: DME instruction;Gait training;Stair training;Functional mobility training;Therapeutic exercise;Therapeutic activities;Neuromuscular re-education;Patient/family education     PT Goals(Current goals can be found in the care plan section) Acute Rehab PT Goals Patient Stated Goal: Get better PT Goal Formulation: With patient Time For Goal Achievement: 08/29/13 Potential to Achieve Goals: Good  Visit Information  Last PT Received On: 08/15/13 Assistance Needed: +2 History of Present Illness: Stacey [name] is a 53yo woman with PMH of DM2, HTN, OSA on CPAP with good results, depression, anxiety and recent DVT with PE who presents for chest pressure, atypical chest  pain, palpitations with SOB, and possible apnea. Repeat CT shows  neg PE and INR supratherapeutic. Also +Cdiff.       Prior Functioning  Home Living Family/patient expects to be discharged to:: Private residence Living Arrangements: Children;Other relatives Available Help at Discharge: Family Type of Home: House Home Access: Stairs to enter Technical brewer of Steps: 5 Entrance Stairs-Rails: Right Home Layout: One level Home Equipment: Walker - standard;Cane - single point Prior Function Level of Independence: Independent with assistive device(s) Communication Communication: No difficulties    Cognition  Cognition Arousal/Alertness: Awake/alert Behavior During Therapy: WFL for tasks assessed/performed Overall Cognitive Status: Within Functional Limits for tasks assessed    Extremity/Trunk Assessment Upper Extremity Assessment Upper Extremity Assessment: Defer to OT evaluation Lower Extremity Assessment Lower Extremity Assessment: Generalized weakness Cervical / Trunk Assessment Cervical / Trunk Assessment: Normal      End of Session PT - End of Session Equipment Utilized During Treatment: Gait belt Activity Tolerance: Patient limited by fatigue;Patient limited by lethargy Patient left: in bed;with call bell/phone within reach;with family/visitor present Nurse Communication: Mobility status  GP    Cordelia Poche, SPT Pager:  [phone]  Cordelia Poche 08/15/2013, 5:54 PM

## 2013-08-15 NOTE — Progress Notes (Signed)
I have seen the patient and reviewed the daily progress note by medical student  and discussed the care of the patient with them. Please see my note for documentation of my findings, assessment, and plans  Anshu Wehner, MD PGY3, Internal Medicine Teaching Service Pager: [phone]   

## 2013-08-15 NOTE — Accreditation Note (Signed)
Chaplain responded to request of Nursing Unit to provide pastoral support for patient.  Presented to patient who the Chaplain had met on a prior hospital admission in December 2014.  Dialogued with patient as to the nature of her concerns regarding her medical status.  Patient expressed she is stressed over the many medical issues she continues to have which causes her to experience anxiety and worry. Chaplain encouraged patient to try not to focus so  with her medical concerns that they stress her out, but try use her spiritual options of prayer, reading her bible  which she reports are important support to her overall well being.  Chaplain encouraged her to continue to do those things that brings her peace of mind.  Chaplain will follow up at a later time with patient for on-going support. Chaplain Chaplain, 30 mins. [place]

## 2013-08-15 NOTE — Progress Notes (Signed)
Read, reviewed, edited and agree with student's findings and recommendations.  Takeem Krotzer B. Vedder Brittian, PT, DPT #[phone]  

## 2013-08-15 NOTE — Progress Notes (Signed)
UR completed 

## 2013-08-15 NOTE — Progress Notes (Deleted)
Pt discharged home in private vehicle v/s stable

## 2013-08-15 NOTE — Care Management Note (Signed)
    Page 1 of 1   08/15/2013     4:30:00 PM   CARE MANAGEMENT NOTE 08/15/2013  Patient:  Stacey [name], Stacey [name]   Account Number:  [id number]  Date Initiated:  08/15/2013  Documentation initiated by:  GRAVES-BIGELOW,Caytlin Better  Subjective/Objective Assessment:   Pt admitted for cp. + c.diff on po flagyl. Plan for d/c home with RN services. Pt is active with [facility]. Will need resumption orders. PT to work with pt and to provide exercises if needed.     Action/Plan:   CM did call [facility] to make them aware that pt is here.   Anticipated DC Date:  08/15/2013   Anticipated DC Plan:  [place]  CM consult      [facility] Choice  HOME HEALTH  Resumption Of Svcs/PTA Provider   Choice offered to / List presented to:  C-1 Patient        [place] arranged  HH-1 RN  HH-10 DISEASE MANAGEMENT      Status of service:  Completed, signed off Medicare Important Message given?   (If response is "NO", the following Medicare IM given date fields will be blank) Date Medicare IM given:   Date Additional Medicare IM given:    Discharge Disposition:  [place]  Per UR Regulation:  Reviewed for med. necessity/level of care/duration of stay  If discussed at [place] of Stay Meetings, dates discussed:    Comments:

## 2013-08-16 NOTE — Discharge Summary (Signed)
I saw Stacey [name] on day of discharge and agree with the discharge plan.

## 2013-08-16 NOTE — Discharge Summary (Signed)
Patient Name:  Stacey [name]  MRN: [id number]  PCP: Evelene Croon, MD  DOB:  05-11-1961       Date of Admission:  08/14/2013  Date of Discharge:  08/15/2013      Attending Physician: Dr. Criselda Peaches   DISCHARGE DIAGNOSES: 1. Principal Problem: 2.   Chest pain, atypical 3. Active Problems: 4.   Heart palpitations 5.   Morbid obesity 6.   Anxiety and depression 7.   HTN (hypertension) 8.   Dyspnea 9.   OSA (obstructive sleep apnea) 10.   Short of breath on exertion 11.   Atypical chest pain 12.   Localized swelling, mass, or lump of right lower extremity 13.    DISPOSITION AND FOLLOW-UP: Stacey [name] is to follow-up with the listed providers as detailed below, at patient's visiting, please address following issues:   1. Please check INR check and BMP for kidney function (her Cre slightly up from 1.06-->1.12 at discharge) 2. Patient need to repeat a sleep study. 3. Make sure patient will follow-up with your cardiologist Dr. Gwen Pounds at the [facility] as scheduled.   Follow-up Information   Follow up with Evelene Croon, MD On 08/19/2013. (11:00 a.m. for INR check, kidney function check, and hospital follow up)    Specialty:  Family Medicine   Contact information:   [facility] [place] [address] [phone]      Discharge Orders   Future Orders Complete By Expires   Diet - low sodium heart healthy  As directed    Increase activity slowly  As directed       DISCHARGE MEDICATIONS:   Medication List    STOP taking these medications       amoxicillin-clarithromycin-lansoprazole combo pack  Commonly known as:  PREVPAC     loperamide 2 MG capsule  Commonly known as:  IMODIUM     LORazepam 1 MG tablet  Commonly known as:  ATIVAN      TAKE these medications       acetaminophen 500 MG tablet  Commonly known as:  TYLENOL  Take 500 mg by mouth every 6 (six) hours as needed for mild pain.     albuterol 108 (90 BASE) MCG/ACT inhaler    Commonly known as:  PROVENTIL HFA;VENTOLIN HFA  Inhale 2 puffs into the lungs every 4 (four) hours as needed for wheezing or shortness of breath.     buPROPion 300 MG 24 hr tablet  Commonly known as:  WELLBUTRIN XL  Take 300 mg by mouth daily.     busPIRone 15 MG tablet  Commonly known as:  BUSPAR  Take 2 tablets (30 mg total) by mouth 2 (two) times daily.     clarithromycin 500 MG tablet  Commonly known as:  BIAXIN  Take 1 tablet (500 mg total) by mouth 2 (two) times daily.     escitalopram 20 MG tablet  Commonly known as:  LEXAPRO  Take 20 mg by mouth daily.     glimepiride 4 MG tablet  Commonly known as:  AMARYL  Take 4 mg by mouth daily with breakfast.     hydrochlorothiazide 25 MG tablet  Commonly known as:  HYDRODIURIL  Take 25 mg by mouth at bedtime.     lisinopril 10 MG tablet  Commonly known as:  PRINIVIL,ZESTRIL  Take 10 mg by mouth daily.     metroNIDAZOLE 500 MG tablet  Commonly known as:  FLAGYL  Take 1 tablet (500 mg total) by mouth  3 (three) times daily.     pantoprazole 40 MG tablet  Commonly known as:  PROTONIX  Take 1 tablet (40 mg total) by mouth 2 (two) times daily.     warfarin 5 MG tablet  Commonly known as:  COUMADIN  Take 7.5 mg by mouth daily at 6 PM.         CONSULTS:       PROCEDURES PERFORMED:  Dg Chest 2 View  08/14/2013   CLINICAL DATA:  Chest tightness.  Short of breath.  Cough  EXAM: CHEST  2 VIEW  COMPARISON:  08/13/2013  FINDINGS: Cardiac silhouette is borderline enlarged. Normal mediastinal and hilar contours. Clear lungs. No pleural effusion or pneumothorax. The bony thorax is intact.  IMPRESSION: No active cardiopulmonary disease.   Electronically Signed   By: Lajean Manes M.D.   On: 08/14/2013 12:01   Dg Chest 2 View  07/25/2013   CLINICAL DATA:  Shortness of breath, hemoptysis  EXAM: CHEST  2 VIEW  COMPARISON:  Prior radiograph from 07/19/2013  FINDINGS: Moderate cardiomegaly is not significantly changed relative to  prior exam.  Lungs are normally inflated. Triangular opacity within the medial right lung base is most compatible with right lower lobe atelectasis. Additional curvilinear opacity projecting over the lateral right lung base likely represents a skin fold. No focal infiltrates identified. No pulmonary edema or pleural effusion. No pneumothorax.  IMPRESSION: 1. Right lower lobe atelectasis. No definite focal infiltrates, although a possible superimposed infiltrate within the right lung base is not entirely excluded. 2. Stable cardiomegaly without pulmonary edema or pleural effusion.   Electronically Signed   By: Jeannine Boga M.D.   On: 07/25/2013 00:38   Dg Chest 2 View  07/19/2013   CLINICAL DATA:  53 year old female with fever  EXAM: CHEST  2 VIEW  COMPARISON:  07/15/2013 and prior chest radiographs  FINDINGS: Cardiomegaly again identified.  Mild peribronchial thickening is stable.  There is no evidence of focal airspace disease, pulmonary edema, suspicious pulmonary nodule/mass, pleural effusion, or pneumothorax. No acute bony abnormalities are identified.  IMPRESSION: Cardiomegaly without evidence of acute cardiopulmonary disease.   Electronically Signed   By: Hassan Rowan M.D.   On: 07/19/2013 23:07   Ct Angio Chest Pe W/cm &/or Wo Cm  08/06/2013   CLINICAL DATA:  53 year old female with chest pain shortness of breath cough and congestion. Right lower extremity DVT. Recent chest CTA with possible right lower lobe pulmonary embolus. Initial encounter.  EXAM: CT ANGIOGRAPHY CHEST WITH CONTRAST  TECHNIQUE: Multidetector CT imaging of the chest was performed using the standard protocol during bolus administration of intravenous contrast. Multiplanar CT image reconstructions including MIPs were obtained to evaluate the vascular anatomy.  CONTRAST:  180 mL OMNIPAQUE IOHEXOL 350 MG/ML SOLN  COMPARISON:  Chest CTA 07/15/2013.  FINDINGS: Scanner malfunction occurred with the 1st 100 mL contrast administration.  Ultimately, Good contrast bolus timing in the pulmonary arterial tree. Mild respiratory motion artifact. Central and hilar pulmonary arteries remain patent. No convincing pulmonary artery filling defect.  Large body habitus. Major airways are patent. No confluent or abnormal pulmonary opacity identified.  Negative thoracic inlet. Cardiomegaly. No pericardial or pleural effusion. No mediastinal lymphadenopathy. Negative visualized aorta and great vessels. Stable and negative visualized upper abdominal viscera.  Degenerative changes throughout the spine. No acute osseous abnormality identified.  Review of the MIP images confirms the above findings.  IMPRESSION: 1.  No evidence of acute pulmonary embolus. 2. Cardiomegaly.  No acute cardiopulmonary abnormality.  Electronically Signed   By: Lars Pinks M.D.   On: 08/06/2013 13:44       ADMISSION DATA:  History of Present Illness: Ms. 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  She is very tearful during our examination and has been to several doctors offices both back home (in [place]) and to multiple emergency room's for similar complaints.  She says ever since being diagnosed with her PE, she has been having episodes of shortness of breath and mainly at night which leads her to be fearful of falling asleep.  She says she wakes up suddenly from sleep from not breathing and is very concerned. She also reports substernal constant, non-radiating chest pressure x3 weeks along with occasional palpitations (last night many that did not allow her to sleep).  She reports being seen by a cardiologist at [facility] clinic and says she was recommended to get a stress test and an echo, both of which she has been unable to get done as an outpatient.  She says she was told by her  doctors office that it would be easier and faster to get admitted to the hospital to get those tests done.  We will need to verify records from the cardiologist and PCP.   Ms. Calixte also reports feeling light-headed at times, some nausea and dry cough, decreased appetite and has been trying to lose weight. She denies abdominal pain or any urinary complaints.  She reports compliance with her CPAP but expresses frustration that she continues to have these episodes.  She is very fearful that "something is wrong with her heart" and just wants [place] to "fix it".    Of note: last echo 07/2013: technically limited study, Image quality was inadequate but EF 19-41%, LV diastolic dysfunction, mild LVH. Last hospital admission 07/2013 for DOE and CTA with single questionable pulmonary embolus.   Physical Exam:  VS: Blood pressure 174/77, pulse 81, temperature 97.2 F (36.2 C), temperature source Oral, resp. rate 16, last menstrual period 08/14/2013, SpO2 100.00%. General: resting in bed, tearful HEENT: PERRL, EOMI Cardiac: distant heart sounds but RRR, no murmurs. Tenderness to palpation of substernal area. Pulm: clear to auscultation bilaterally but distant due to body habitus Abd: soft, obese, mild epigastric tenderness, nondistended, BS present Ext: warm and well perfused, +2dp b/l, large R medial thigh soft tissue lesion with dimpling and a clean and dry dressing on top but no visible opening on mass, multiple excoriations on b/l lower extremities (kitten scratch marks), non-tenderness to palpation, tenderness to palpation of b/l lower extremities Neuro: alert and oriented X3, cranial nerves II-XII grossly intact, strength and sensation to light touch equal in bilateral upper and lower extremities  CBC: Recent Labs   08/14/13 1049   WBC  5.5   NEUTROABS  3.7   HGB  12.0   HCT  35.0*   MCV  82.0   PLT  252    Cardiac Enzymes:  Recent Labs   08/14/13 1838   TROPONINI  <0.30    BNP:  Recent  Labs   08/14/13 1049   PROBNP  53.1    Fasting Lipid Panel:  Recent Labs   08/14/13 1838   CHOL  245*   HDL  46   LDLCALC  173*   TRIG  132  CHOLHDL  5.3    Coagulation:  Recent Labs   08/14/13 1049   LABPROT  35.0*   INR  3.66*    HOSPITAL COURSE:  Atypical chest pain-- patient presented with atypical chest pain which has improved significantly. Pt has significant risk factors including hypertension and DM2. TIMI 2 (risk 8%). ACS was ruled out by Trop negative X 2 and negative poc trop X 1. It is likely due to the combination of H. Pylori infection and anxiety. Other DD include, but unlikely, PE (INR supra therapeutic and PNA (negative CXR).  TSH 0.91, LDL 173. Continued treatment for H. Pylori with antibiotics. We held statin given interaction with clarithromycin. Since patient was found to have positive C diff PCR, amoxicillin was switched to Flagyl. Clarithromycin was continued at discharge. Home PPI was continued. Patient also needs to follow up at [facility] cardiology for Holter monitor, echo, and Lexiscan Myoview stress test as recommended by her cardiologist.      SOB/?apnea--It mainly happens during sleep. The repeated PFTs showed mild obstructive and restrictive impairment, likely due to the patient's body habitus. Patient's severe anxiety may have also contributed to his shortness of breath and apnea. We adjusted her medications and increase the dose of buspirone to 30 mg twice a day. The patient may need to have her CPAP adjusted to rule out any possibility of malfunctioning, and will need to follow up with her PCP to arrange this in the outpatient setting.     Hx of PE: The patient's coumadin was held during her hospital stay for a supratherapeutic INR (3.6 -> 4.32 at discharge). Pharmacy was consulted and recommended that the patient resume taking coumadin at a reduced dose, 5 mg daily at 6pm starting on Saturday 1/17, with an INR check scheduled for Monday 1/19.  Home Health RN was ordered at discharge,  She needs 3x/wk home health INR blood draws for checking INR. Instructions were given for home health nurse or lab to fax INR results to [facility], fax number is [phone].  Nausea and diarrhea--  Most likely due to the combination of C diff colitis and H. Pylori infection. At admission the patient complained of chronic diarrhea. Given recent antibiotic use, C. Diff stool antigen test was performed. The patient was found to be positive for C. Difficile. This was treated as uncomplicated, non-recurrent C. Difficile infection. Therapy was initiated with metronidazole 500 mg TID x 10 days starting 1/15 (switched amoxicillin to metronidazole). We also re-started a 10 day course of therapy for H. Pylori given positive test in PCP records and patient report that she had not adhered completely to the previously prescribed triple therapy.   Anxiety and Depression--very anxious and concerned that she could stop breathing when she falls a sleep. On Wellbutrin, Buspar, Lexapro, and Ativan at home. We held Ativan given reports of apneic episodes at night,  increased dose of Buspar to 30 mg bid, and continue home doses of Wellbutrin and Lexapro.   Hypertension--BP elevated on admission, likely secondary to anxiety and distress.  Home medications include Lisinopril and HCTZ. Cre is slightly up from 1.06-->1.12.  Continue home medications. Need to check BMP at follow up visit.  OSA--on CPAP at home  Large RLE mass--soft, dimpled, erythematous, mildly tender, and growing x5 years per patient. She says it has been evaluated by numerous physicians in the past but no one will remove it. ?lipoma. No open wound on mass but edematous. No intervention done in hospital. Out pt follow  up.  PT evaluation: patient was evaluated by PT before discharge. PT recommended rolloing walker and HHPT, but given the patient's insurance status she does not qualify for home health PT  and refused a SNF or inpatient rehab facility. We discussed the plan for discharge and the patient agreed with the plan of care. She agreed to follow up on Monday 1/19 with her PCP and will consider thereafter shifting her primary care to the [facility] family practice, since she also sees cardiology at that location.    DISCHARGE DATA: Vital Signs: BP 147/72  Pulse 79  Temp(Src) 98 F (36.7 C) (Oral)  Resp 18  Ht 5\' 6"  (1.676 m)  Wt 398 lb 9.6 oz (180.804 kg)  BMI 64.37 kg/m2  SpO2 98%  LMP 08/14/2013  Labs: Results for orders placed during the hospital encounter of 08/14/13 (from the past 24 hour(s))  CLOSTRIDIUM DIFFICILE BY PCR     Status: Abnormal   Collection Time    08/15/13 11:14 AM      Result Value Range   C difficile by pcr POSITIVE (*) NEGATIVE   Services Ordered on Discharge: Y = Yes; Blank = No PT:   OT:   RN:  yes  Equipment: Rolling walker  Other:     Time Spent on Discharge: 35 min  Signed: Ivor Costa, MD PGY 3, Internal Medicine Resident 08/16/2013, 7:43 AM

## 2013-08-16 NOTE — Progress Notes (Signed)
2013-09-11 1600  PT G-Codes **NOT FOR INPATIENT CLASS**  Functional Assessment Tool Used assist level  Functional Limitation Mobility: Walking and moving around  Mobility: Walking and Moving Around Current Status (E7035) CI  Mobility: Walking and Moving Around Goal Status (K0938) CI  late entry g-codes for eval dated 09-11-2013. Barbarann Ehlers [place], [place], DPT [phone]

## 2013-08-19 ENCOUNTER — Ambulatory Visit: Payer: Self-pay | Admitting: Internal Medicine

## 2013-08-20 ENCOUNTER — Encounter (HOSPITAL_COMMUNITY): Payer: Self-pay | Admitting: Emergency Medicine

## 2013-08-20 ENCOUNTER — Emergency Department (HOSPITAL_COMMUNITY)
Admission: EM | Admit: 2013-08-20 | Discharge: 2013-08-20 | Disposition: A | Discharge status: Home / Self Care | Payer: Medicaid Other | Attending: Emergency Medicine | Admitting: Emergency Medicine

## 2013-08-20 DIAGNOSIS — Z792 Long term (current) use of antibiotics: Secondary | ICD-10-CM | POA: Insufficient documentation

## 2013-08-20 DIAGNOSIS — E119 Type 2 diabetes mellitus without complications: Secondary | ICD-10-CM | POA: Insufficient documentation

## 2013-08-20 DIAGNOSIS — Z79899 Other long term (current) drug therapy: Secondary | ICD-10-CM | POA: Insufficient documentation

## 2013-08-20 DIAGNOSIS — Z8619 Personal history of other infectious and parasitic diseases: Secondary | ICD-10-CM | POA: Insufficient documentation

## 2013-08-20 DIAGNOSIS — Z9981 Dependence on supplemental oxygen: Secondary | ICD-10-CM | POA: Insufficient documentation

## 2013-08-20 DIAGNOSIS — Z8719 Personal history of other diseases of the digestive system: Secondary | ICD-10-CM | POA: Insufficient documentation

## 2013-08-20 DIAGNOSIS — Z9089 Acquired absence of other organs: Secondary | ICD-10-CM | POA: Insufficient documentation

## 2013-08-20 DIAGNOSIS — I10 Essential (primary) hypertension: Secondary | ICD-10-CM | POA: Insufficient documentation

## 2013-08-20 DIAGNOSIS — Z7901 Long term (current) use of anticoagulants: Secondary | ICD-10-CM | POA: Insufficient documentation

## 2013-08-20 DIAGNOSIS — J45909 Unspecified asthma, uncomplicated: Secondary | ICD-10-CM | POA: Insufficient documentation

## 2013-08-20 DIAGNOSIS — F3289 Other specified depressive episodes: Secondary | ICD-10-CM | POA: Insufficient documentation

## 2013-08-20 DIAGNOSIS — R197 Diarrhea, unspecified: Secondary | ICD-10-CM

## 2013-08-20 DIAGNOSIS — Z872 Personal history of diseases of the skin and subcutaneous tissue: Secondary | ICD-10-CM | POA: Insufficient documentation

## 2013-08-20 DIAGNOSIS — Z86718 Personal history of other venous thrombosis and embolism: Secondary | ICD-10-CM | POA: Insufficient documentation

## 2013-08-20 DIAGNOSIS — F329 Major depressive disorder, single episode, unspecified: Secondary | ICD-10-CM | POA: Insufficient documentation

## 2013-08-20 DIAGNOSIS — G4730 Sleep apnea, unspecified: Secondary | ICD-10-CM | POA: Insufficient documentation

## 2013-08-20 DIAGNOSIS — Z8739 Personal history of other diseases of the musculoskeletal system and connective tissue: Secondary | ICD-10-CM | POA: Insufficient documentation

## 2013-08-20 LAB — CBC WITH DIFFERENTIAL/PLATELET
BASOS ABS: 0 10*3/uL (ref 0.0–0.1)
Basophils Relative: 0 % (ref 0–1)
Eosinophils Absolute: 0.1 10*3/uL (ref 0.0–0.7)
Eosinophils Relative: 1 % (ref 0–5)
HEMATOCRIT: 34 % — AB (ref 36.0–46.0)
HEMOGLOBIN: 11.6 g/dL — AB (ref 12.0–15.0)
LYMPHS PCT: 24 % (ref 12–46)
Lymphs Abs: 1.9 10*3/uL (ref 0.7–4.0)
MCH: 28.5 pg (ref 26.0–34.0)
MCHC: 34.1 g/dL (ref 30.0–36.0)
MCV: 83.5 fL (ref 78.0–100.0)
MONO ABS: 0.5 10*3/uL (ref 0.1–1.0)
Monocytes Relative: 6 % (ref 3–12)
Neutro Abs: 5.4 10*3/uL (ref 1.7–7.7)
Neutrophils Relative %: 69 % (ref 43–77)
Platelets: 309 10*3/uL (ref 150–400)
RBC: 4.07 MIL/uL (ref 3.87–5.11)
RDW: 14 % (ref 11.5–15.5)
WBC: 7.9 10*3/uL (ref 4.0–10.5)

## 2013-08-20 LAB — BASIC METABOLIC PANEL
BUN: 23 mg/dL (ref 6–23)
CHLORIDE: 99 meq/L (ref 96–112)
CO2: 21 meq/L (ref 19–32)
CREATININE: 1.04 mg/dL (ref 0.50–1.10)
Calcium: 10 mg/dL (ref 8.4–10.5)
GFR calc Af Amer: 71 mL/min — ABNORMAL LOW (ref >90)
GFR calc non Af Amer: 62 mL/min — ABNORMAL LOW (ref >90)
Glucose, Bld: 132 mg/dL — ABNORMAL HIGH (ref 70–99)
POTASSIUM: 3.4 meq/L — AB (ref 3.7–5.3)
Sodium: 137 mEq/L (ref 137–147)

## 2013-08-20 MED ORDER — VANCOMYCIN HCL 125 MG PO CAPS: 125.0000 mg | ORAL_CAPSULE | Freq: Four times a day (QID) | ORAL | Status: DC

## 2013-08-20 MED ORDER — NYSTATIN 100000 UNIT/ML MT SUSP: 500000.0000 [IU] | Freq: Four times a day (QID) | OROMUCOSAL | Status: DC

## 2013-08-20 MED ORDER — VANCOMYCIN 50 MG/ML ORAL SOLUTION
125.0000 mg | Freq: Once | ORAL | Status: AC
  Administered 2013-08-20: 125 mg via ORAL
  Filled 2013-08-20: qty 2.5

## 2013-08-20 MED ORDER — DIPHENOXYLATE-ATROPINE 2.5-0.025 MG PO TABS: 1.0000 | ORAL_TABLET | Freq: Four times a day (QID) | ORAL | Status: DC | PRN

## 2013-08-20 MED ORDER — FLUCONAZOLE 150 MG PO TABS: 150.0000 mg | ORAL_TABLET | Freq: Once | ORAL | Status: DC

## 2013-08-20 MED ORDER — VANCOMYCIN 50 MG/ML ORAL SOLUTION: 250.0000 mg | Freq: Four times a day (QID) | ORAL | Status: DC

## 2013-08-20 NOTE — ED Notes (Signed)
States she was recently admitted to [facility] and diagnosed with c diff, states she is not getting any better

## 2013-08-20 NOTE — Discharge Instructions (Signed)
Antibiotic vancomycin 4 times a day for diarrhea. Also prescription for diarrhea. Increase fluids. Stool culture pending.

## 2013-08-20 NOTE — ED Notes (Signed)
Pt just d/c. Pt has been calling the EDP names and states he has not even looked at her tongue and is upset.comfort measures given and advised INR check is no an emergency today and home health nurse can check it tomorrow or today. EDP went in just prior to d/c and pt got upset. Pt has cried several times while in ED stating she is tired of being in the hospital. Family in room tried to calm her down also. After rx's given pt was better and said "thankyou".

## 2013-08-20 NOTE — ED Provider Notes (Signed)
CSN: [id number]     Arrival date & time 08/20/13  1154 History  This chart was scribed for Nat Christen, MD by Roxan Diesel, ED scribe.  This patient was seen in room APA10/AP[facility]10 and the patient's care was started at 1:20 PM.   Chief Complaint  Patient presents with  . Diarrhea    The history is provided by the patient and medical records. No language interpreter was used.    HPI Comments: Stacey [name] is a 53 y.o. female with recent diagnosis of C. Diff and H. Pylori, and h/o pancreatitis, anxiety, DM, and HTN who presents to the Emergency Department complaining of over one week of persistent diarrhea.  Pt was recently hospitalized (08/14/13) at [facility] for chest pressure, SOB and dizziness.  She was determined to be stable for discharge with cardiology f/u, and today she received a stress test which she has not yet heard the results yet.  At admission she also complained of chronic diarrhea.  This was eventually determined to be most likely caused by a combination of C. Diff colitis (based on stool antigen test) and H. Pylori infection, as she had recently been diagnosed with H. Pylori and placed on a course of antibiotics which she did not finish.  She was placed on a 10-day course of metronidazole for her C. Diff.  Since then pt states she is still experiencing diarrhea on a daily basis, although it has improved slightly, from around 8 episodes/day to around 4.  She also complains of a "gurgling" sensation in her LUQ.  She has been able to tolerate food and today ate a small amount of ground chicken and mayonnaise and drank Gatorade.  She states that she called her PCP's office today and was advised to come to the ED.   Pt states she has had C. Diff before and it was successfully treated with Vancomycin in the past.   Past Medical History  Diagnosis Date  . Hypertension   . Depression   . Type 2 diabetes mellitus   . Diverticulitis   . Asthma   . Cyst     pt has "cyst" to legs for  years, areas drain at time  . Pancreatitis   . Arthritis   . C. difficile colitis 11/08/2011  . Musculoskeletal pain 11/08/2011  . Neuropathic pain 11/10/2011  . Sleep apnea with use of continuous positive airway pressure (CPAP)     uses CPAP at night  . 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 DVT (deep venous thrombosis)     Past Surgical History  Procedure Laterality Date  . Tonsillectomy    . Cholecystectomy    . Cesarean section    . Nose surgery      No family history on file.   History  Substance Use Topics  . Smoking status: Never Smoker   . Smokeless tobacco: Never Used  . Alcohol Use: No    OB History   Grav Para Term Preterm Abortions TAB SAB Ect Mult Living                  Review of Systems A complete 10 system review of systems was obtained and all systems are negative except as noted in the HPI and PMH.    Allergies  Nitroglycerin; Tramadol; Dilaudid; Levaquin; Trazodone and nefazodone; and Sulfa antibiotics  Home Medications   Current Outpatient Rx  Name  Route  Sig  Dispense  Refill  . acetaminophen (  TYLENOL) 500 MG tablet   Oral   Take 500 mg by mouth every 6 (six) hours as needed for mild pain.         Marland Kitchen albuterol (PROVENTIL HFA;VENTOLIN HFA) 108 (90 BASE) MCG/ACT inhaler   Inhalation   Inhale 2 puffs into the lungs every 4 (four) hours as needed for wheezing or shortness of breath.   1 Inhaler   0   . buPROPion (WELLBUTRIN XL) 300 MG 24 hr tablet   Oral   Take 300 mg by mouth daily.          . busPIRone (BUSPAR) 15 MG tablet   Oral   Take 2 tablets (30 mg total) by mouth 2 (two) times daily.   120 tablet   1   . clarithromycin (BIAXIN) 500 MG tablet   Oral   Take 1 tablet (500 mg total) by mouth 2 (two) times daily.   19 tablet   0   . escitalopram (LEXAPRO) 20 MG tablet   Oral   Take 20 mg by mouth daily.           Marland Kitchen glimepiride (AMARYL) 4 MG tablet   Oral   Take 4 mg by mouth daily with  breakfast.         . hydrochlorothiazide (HYDRODIURIL) 25 MG tablet   Oral   Take 25 mg by mouth at bedtime.          Marland Kitchen lisinopril (PRINIVIL,ZESTRIL) 10 MG tablet   Oral   Take 10 mg by mouth daily.         . metroNIDAZOLE (FLAGYL) 500 MG tablet   Oral   Take 1 tablet (500 mg total) by mouth 3 (three) times daily.   29 tablet   0   . pantoprazole (PROTONIX) 40 MG tablet   Oral   Take 1 tablet (40 mg total) by mouth 2 (two) times daily.   19 tablet   0   . warfarin (COUMADIN) 5 MG tablet   Oral   Take 7.5 mg by mouth daily at 6 PM.           BP 165/83  Pulse 75  Temp(Src) 98.1 F (36.7 C)  Resp 22  Ht 5\' 6"  (1.676 m)  Wt 412 lb (186.882 kg)  BMI 66.53 kg/m2  SpO2 96%  LMP 08/14/2013  Physical Exam  Nursing note and vitals reviewed. Constitutional: She is oriented to person, place, and time. She appears well-developed and well-nourished.  Morbidly obese  HENT:  Head: Normocephalic and atraumatic.  Eyes: Conjunctivae and EOM are normal. Pupils are equal, round, and reactive to light.  Neck: Normal range of motion. Neck supple.  Cardiovascular: Normal rate, regular rhythm and normal heart sounds.   Pulmonary/Chest: Effort normal and breath sounds normal.  Abdominal: Soft. Bowel sounds are normal. There is no tenderness.  Musculoskeletal: Normal range of motion.  Neurological: She is alert and oriented to person, place, and time.  Skin: Skin is warm and dry.  Psychiatric: She has a normal mood and affect. Her behavior is normal.    ED Course  Procedures (including critical care time)  DIAGNOSTIC STUDIES: Oxygen Saturation is 96% on room air, normal by my interpretation.    COORDINATION OF CARE: 1:25 PM-Discussed treatment plan which includes review of pt's records with pt at bedside and pt agreed to plan.    Labs Review Labs Reviewed  CBC WITH DIFFERENTIAL - Abnormal; Notable for the following:    Hemoglobin 11.6 (*)  HCT 34.0 (*)    All  other components within normal limits  BASIC METABOLIC PANEL - Abnormal; Notable for the following:    Potassium 3.4 (*)    Glucose, Bld 132 (*)    GFR calc non Af Amer 62 (*)    GFR calc Af Amer 71 (*)    All other components within normal limits   Imaging Review No results found.  EKG Interpretation   None       MDM  No diagnosis found. Patient is not dehydrated. She states she had a positive C. difficile culture.  Vancomycin has worked in the past. Will Rx vancomycin and 25 mg 4 times a day for 10 days. Also Rx for Lomotil.    I personally performed the services described in this documentation, which was scribed in my presence. The recorded information has been reviewed and is accurate.    Nat Christen, MD 08/20/13 [phone]

## 2013-08-24 LAB — STOOL CULTURE

## 2013-09-02 ENCOUNTER — Emergency Department: Payer: Self-pay | Admitting: Emergency Medicine

## 2013-09-02 LAB — CBC WITH DIFFERENTIAL/PLATELET
BASOS PCT: 1.3 %
Basophil #: 0.1 10*3/uL (ref 0.0–0.1)
Eosinophil #: 0.1 10*3/uL (ref 0.0–0.7)
Eosinophil %: 1.1 %
HCT: 35.2 % (ref 35.0–47.0)
HGB: 12 g/dL (ref 12.0–16.0)
Lymphocyte #: 1.5 10*3/uL (ref 1.0–3.6)
Lymphocyte %: 22 %
MCH: 28.9 pg (ref 26.0–34.0)
MCHC: 34.1 g/dL (ref 32.0–36.0)
MCV: 85 fL (ref 80–100)
Monocyte #: 0.4 x10 3/mm (ref 0.2–0.9)
Monocyte %: 6.7 %
NEUTROS ABS: 4.5 10*3/uL (ref 1.4–6.5)
Neutrophil %: 68.9 %
Platelet: 231 10*3/uL (ref 150–440)
RBC: 4.15 10*6/uL (ref 3.80–5.20)
RDW: 14.4 % (ref 11.5–14.5)
WBC: 6.6 10*3/uL (ref 3.6–11.0)

## 2013-09-02 LAB — URINALYSIS, COMPLETE
BACTERIA: NONE SEEN
Bilirubin,UR: NEGATIVE
Blood: NEGATIVE
Glucose,UR: NEGATIVE mg/dL (ref 0–75)
Hyaline Cast: 3
KETONE: NEGATIVE
LEUKOCYTE ESTERASE: NEGATIVE
NITRITE: NEGATIVE
PH: 7 (ref 4.5–8.0)
Protein: NEGATIVE
RBC,UR: 1 /HPF (ref 0–5)
SPECIFIC GRAVITY: 1.011 (ref 1.003–1.030)
Squamous Epithelial: <1

## 2013-09-02 LAB — COMPREHENSIVE METABOLIC PANEL
ALBUMIN: 3.7 g/dL (ref 3.4–5.0)
ALK PHOS: 111 U/L
ALT: 189 U/L — AB (ref 12–78)
AST: 123 U/L — AB (ref 15–37)
Anion Gap: 7 (ref 7–16)
BUN: 18 mg/dL (ref 7–18)
Bilirubin,Total: 0.4 mg/dL (ref 0.2–1.0)
CALCIUM: 9.9 mg/dL (ref 8.5–10.1)
CO2: 23 mmol/L (ref 21–32)
CREATININE: 1.3 mg/dL (ref 0.60–1.30)
Chloride: 103 mmol/L (ref 98–107)
GFR CALC AF AMER: 55 — AB
GFR CALC NON AF AMER: 48 — AB
Glucose: 128 mg/dL — ABNORMAL HIGH (ref 65–99)
OSMOLALITY: 270 (ref 275–301)
Potassium: 3.8 mmol/L (ref 3.5–5.1)
Sodium: 133 mmol/L — ABNORMAL LOW (ref 136–145)
TOTAL PROTEIN: 7.6 g/dL (ref 6.4–8.2)

## 2013-09-02 LAB — PROTIME-INR
INR: 2.6
Prothrombin Time: 27.1 secs — ABNORMAL HIGH (ref 11.5–14.7)

## 2013-09-02 LAB — APTT: Activated PTT: 42.8 secs — ABNORMAL HIGH (ref 23.6–35.9)

## 2013-09-09 ENCOUNTER — Emergency Department (HOSPITAL_COMMUNITY)
Admission: EM | Admit: 2013-09-09 | Discharge: 2013-09-09 | Disposition: A | Discharge status: Home / Self Care | Payer: Medicaid Other | Attending: Emergency Medicine | Admitting: Emergency Medicine

## 2013-09-09 ENCOUNTER — Encounter (HOSPITAL_COMMUNITY): Payer: Self-pay | Admitting: Emergency Medicine

## 2013-09-09 ENCOUNTER — Emergency Department (HOSPITAL_COMMUNITY): Payer: Medicaid Other

## 2013-09-09 DIAGNOSIS — J45909 Unspecified asthma, uncomplicated: Secondary | ICD-10-CM | POA: Insufficient documentation

## 2013-09-09 DIAGNOSIS — G4730 Sleep apnea, unspecified: Secondary | ICD-10-CM | POA: Insufficient documentation

## 2013-09-09 DIAGNOSIS — R7402 Elevation of levels of lactic acid dehydrogenase (LDH): Secondary | ICD-10-CM | POA: Insufficient documentation

## 2013-09-09 DIAGNOSIS — Z8619 Personal history of other infectious and parasitic diseases: Secondary | ICD-10-CM | POA: Insufficient documentation

## 2013-09-09 DIAGNOSIS — Z9981 Dependence on supplemental oxygen: Secondary | ICD-10-CM | POA: Insufficient documentation

## 2013-09-09 DIAGNOSIS — M129 Arthropathy, unspecified: Secondary | ICD-10-CM | POA: Insufficient documentation

## 2013-09-09 DIAGNOSIS — Z9089 Acquired absence of other organs: Secondary | ICD-10-CM | POA: Insufficient documentation

## 2013-09-09 DIAGNOSIS — F329 Major depressive disorder, single episode, unspecified: Secondary | ICD-10-CM | POA: Insufficient documentation

## 2013-09-09 DIAGNOSIS — Z86718 Personal history of other venous thrombosis and embolism: Secondary | ICD-10-CM | POA: Insufficient documentation

## 2013-09-09 DIAGNOSIS — IMO0001 Reserved for inherently not codable concepts without codable children: Secondary | ICD-10-CM | POA: Insufficient documentation

## 2013-09-09 DIAGNOSIS — R109 Unspecified abdominal pain: Secondary | ICD-10-CM

## 2013-09-09 DIAGNOSIS — Z792 Long term (current) use of antibiotics: Secondary | ICD-10-CM | POA: Insufficient documentation

## 2013-09-09 DIAGNOSIS — Z79899 Other long term (current) drug therapy: Secondary | ICD-10-CM | POA: Insufficient documentation

## 2013-09-09 DIAGNOSIS — R1011 Right upper quadrant pain: Secondary | ICD-10-CM | POA: Insufficient documentation

## 2013-09-09 DIAGNOSIS — I10 Essential (primary) hypertension: Secondary | ICD-10-CM | POA: Insufficient documentation

## 2013-09-09 DIAGNOSIS — F3289 Other specified depressive episodes: Secondary | ICD-10-CM | POA: Insufficient documentation

## 2013-09-09 DIAGNOSIS — R1013 Epigastric pain: Secondary | ICD-10-CM | POA: Insufficient documentation

## 2013-09-09 DIAGNOSIS — Z872 Personal history of diseases of the skin and subcutaneous tissue: Secondary | ICD-10-CM | POA: Insufficient documentation

## 2013-09-09 DIAGNOSIS — R1012 Left upper quadrant pain: Secondary | ICD-10-CM | POA: Insufficient documentation

## 2013-09-09 DIAGNOSIS — E119 Type 2 diabetes mellitus without complications: Secondary | ICD-10-CM | POA: Insufficient documentation

## 2013-09-09 DIAGNOSIS — R7401 Elevation of levels of liver transaminase levels: Secondary | ICD-10-CM | POA: Insufficient documentation

## 2013-09-09 DIAGNOSIS — R740 Nonspecific elevation of levels of transaminase and lactic acid dehydrogenase [LDH]: Secondary | ICD-10-CM

## 2013-09-09 DIAGNOSIS — Z7901 Long term (current) use of anticoagulants: Secondary | ICD-10-CM | POA: Insufficient documentation

## 2013-09-09 LAB — COMPREHENSIVE METABOLIC PANEL
ALK PHOS: 115 U/L (ref 39–117)
ALT: 135 U/L — ABNORMAL HIGH (ref 0–35)
AST: 75 U/L — AB (ref 0–37)
Albumin: 4.3 g/dL (ref 3.5–5.2)
BUN: 17 mg/dL (ref 6–23)
CO2: 23 meq/L (ref 19–32)
Calcium: 10.5 mg/dL (ref 8.4–10.5)
Chloride: 98 mEq/L (ref 96–112)
Creatinine, Ser: 1.16 mg/dL — ABNORMAL HIGH (ref 0.50–1.10)
GFR calc non Af Amer: 54 mL/min — ABNORMAL LOW (ref >90)
GFR, EST AFRICAN AMERICAN: 63 mL/min — AB (ref >90)
GLUCOSE: 113 mg/dL — AB (ref 70–99)
POTASSIUM: 3.7 meq/L (ref 3.7–5.3)
Sodium: 139 mEq/L (ref 137–147)
Total Bilirubin: 0.7 mg/dL (ref 0.3–1.2)
Total Protein: 8.3 g/dL (ref 6.0–8.3)

## 2013-09-09 LAB — URINALYSIS, ROUTINE W REFLEX MICROSCOPIC
Bilirubin Urine: NEGATIVE
Glucose, UA: NEGATIVE mg/dL
Hgb urine dipstick: NEGATIVE
Ketones, ur: NEGATIVE mg/dL
NITRITE: NEGATIVE
PROTEIN: NEGATIVE mg/dL
SPECIFIC GRAVITY, URINE: 1.02 (ref 1.005–1.030)
Urobilinogen, UA: 0.2 mg/dL (ref 0.0–1.0)
pH: 6 (ref 5.0–8.0)

## 2013-09-09 LAB — CBC WITH DIFFERENTIAL/PLATELET
BASOS ABS: 0 10*3/uL (ref 0.0–0.1)
Basophils Relative: 1 % (ref 0–1)
Eosinophils Absolute: 0.1 10*3/uL (ref 0.0–0.7)
Eosinophils Relative: 2 % (ref 0–5)
HCT: 38 % (ref 36.0–46.0)
Hemoglobin: 12.7 g/dL (ref 12.0–15.0)
LYMPHS ABS: 1.9 10*3/uL (ref 0.7–4.0)
LYMPHS PCT: 30 % (ref 12–46)
MCH: 28.6 pg (ref 26.0–34.0)
MCHC: 33.4 g/dL (ref 30.0–36.0)
MCV: 85.6 fL (ref 78.0–100.0)
Monocytes Absolute: 0.5 10*3/uL (ref 0.1–1.0)
Monocytes Relative: 8 % (ref 3–12)
NEUTROS PCT: 60 % (ref 43–77)
Neutro Abs: 3.9 10*3/uL (ref 1.7–7.7)
PLATELETS: 311 10*3/uL (ref 150–400)
RBC: 4.44 MIL/uL (ref 3.87–5.11)
RDW: 14.2 % (ref 11.5–15.5)
WBC: 6.5 10*3/uL (ref 4.0–10.5)

## 2013-09-09 LAB — URINE MICROSCOPIC-ADD ON

## 2013-09-09 LAB — LIPASE, BLOOD: Lipase: 52 U/L (ref 11–59)

## 2013-09-09 LAB — PROTIME-INR
INR: 2.93 — AB (ref 0.00–1.49)
Prothrombin Time: 29.5 seconds — ABNORMAL HIGH (ref 11.6–15.2)

## 2013-09-09 LAB — CLOSTRIDIUM DIFFICILE BY PCR: Toxigenic C. Difficile by PCR: NEGATIVE

## 2013-09-09 MED ORDER — MORPHINE SULFATE 4 MG/ML IJ SOLN: 4.0000 mg | Freq: Once | INTRAMUSCULAR | Status: DC

## 2013-09-09 MED ORDER — SODIUM CHLORIDE 0.9 % IV BOLUS (SEPSIS)
1000.0000 mL | Freq: Once | INTRAVENOUS | Status: AC
  Administered 2013-09-09: 1000 mL via INTRAVENOUS

## 2013-09-09 MED ORDER — HYDROCODONE-ACETAMINOPHEN 5-325 MG PO TABS
2.0000 | ORAL_TABLET | Freq: Once | ORAL | Status: AC
  Administered 2013-09-09: 2 via ORAL
  Filled 2013-09-09: qty 2

## 2013-09-09 MED ORDER — HYDROCODONE-ACETAMINOPHEN 5-325 MG PO TABS: 2.0000 | ORAL_TABLET | ORAL | Status: DC | PRN

## 2013-09-09 NOTE — ED Provider Notes (Signed)
CSN: [id number]     Arrival date & time 09/09/13  0326 History   First MD Initiated Contact with Patient 09/09/13 0347     No chief complaint on file.  (Consider location/radiation/quality/duration/timing/severity/associated sxs/prior Treatment) HPI Comments: 53 year old female, recent complicated medical history of a possible pulmonary embolism based on a CT angiogram (very small isolated finding), placed on Coumadin at that time. Over the last couple of one she has developed a C. difficile colitis, she has undergone treatment for H. pylori with antibiotics and proton pump inhibitors, has completed a recent course of oral vancomycin for C. difficile and states that her diarrhea improved significantly, she describes her diarrhea as loose stools, initially was having them 4-5 times per day, this improved to once a day a week ago but over the last several days it has slightly worsened in frequency. The nature of the stools has not changed, she has no dysuria, no nausea or vomiting. She states that this evening she developed acute onset of epigastric pain, this is persistent, not associated with vomiting, nothing makes this better or worse. She reports a history of pancreatitis caused by gallstones years ago, this was treated with a cholecystectomy and has not had recurrent pancreatitis. She does not drink alcohol, does not use anti-inflammatory medications or aspirin. She has had no other abdominal surgery other than cesarean section x2 and a cholecystectomy  The history is provided by the patient.    Past Medical History  Diagnosis Date  . Hypertension   . Depression   . Type 2 diabetes mellitus   . Diverticulitis   . Asthma   . Cyst     pt has "cyst" to legs for years, areas drain at time  . Pancreatitis   . Arthritis   . C. difficile colitis 11/08/2011  . Musculoskeletal pain 11/08/2011  . Neuropathic pain 11/10/2011  . Sleep apnea with use of continuous positive airway pressure (CPAP)     uses  CPAP at night  . 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 DVT (deep venous thrombosis)    Past Surgical History  Procedure Laterality Date  . Tonsillectomy    . Cholecystectomy    . Cesarean section    . Nose surgery     No family history on file. History  Substance Use Topics  . Smoking status: Never Smoker   . Smokeless tobacco: Never Used  . Alcohol Use: No   OB History   Grav Para Term Preterm Abortions TAB SAB Ect Mult Living                 Review of Systems  All other systems reviewed and are negative.    Allergies  Nitroglycerin; Tramadol; Dilaudid; Levaquin; Trazodone and nefazodone; and Sulfa antibiotics  Home Medications   Current Outpatient Rx  Name  Route  Sig  Dispense  Refill  . acetaminophen (TYLENOL) 500 MG tablet   Oral   Take 500 mg by mouth every 6 (six) hours as needed for mild pain.         Marland Kitchen albuterol (PROVENTIL HFA;VENTOLIN HFA) 108 (90 BASE) MCG/ACT inhaler   Inhalation   Inhale 2 puffs into the lungs every 4 (four) hours as needed for wheezing or shortness of breath.   1 Inhaler   0   . buPROPion (WELLBUTRIN XL) 300 MG 24 hr tablet   Oral   Take 300 mg by mouth daily.          Marland Kitchen  busPIRone (BUSPAR) 15 MG tablet   Oral   Take 2 tablets (30 mg total) by mouth 2 (two) times daily.   120 tablet   1   . clarithromycin (BIAXIN) 500 MG tablet   Oral   Take 1 tablet (500 mg total) by mouth 2 (two) times daily.   19 tablet   0   . diphenoxylate-atropine (LOMOTIL) 2.5-0.025 MG per tablet   Oral   Take 1 tablet by mouth 4 (four) times daily as needed for diarrhea or loose stools.   20 tablet   0   . escitalopram (LEXAPRO) 20 MG tablet   Oral   Take 20 mg by mouth daily.           . fluconazole (DIFLUCAN) 150 MG tablet   Oral   Take 1 tablet (150 mg total) by mouth once.   4 tablet   0   . glimepiride (AMARYL) 4 MG tablet   Oral   Take 4 mg by mouth daily with breakfast.         .  hydrochlorothiazide (HYDRODIURIL) 25 MG tablet   Oral   Take 25 mg by mouth at bedtime.          Marland Kitchen HYDROcodone-acetaminophen (NORCO/VICODIN) 5-325 MG per tablet   Oral   Take 2 tablets by mouth every 4 (four) hours as needed.   10 tablet   0   . lisinopril (PRINIVIL,ZESTRIL) 10 MG tablet   Oral   Take 5 mg by mouth daily.          . metroNIDAZOLE (FLAGYL) 500 MG tablet   Oral   Take 1 tablet (500 mg total) by mouth 3 (three) times daily.   29 tablet   0   . nystatin (MYCOSTATIN) 100000 UNIT/ML suspension   Oral   Take 5 mLs (500,000 Units total) by mouth 4 (four) times daily.   100 mL   0   . pantoprazole (PROTONIX) 40 MG tablet   Oral   Take 1 tablet (40 mg total) by mouth 2 (two) times daily.   19 tablet   0   . vancomycin (VANCOCIN) 125 MG capsule   Oral   Take 1 capsule (125 mg total) by mouth 4 (four) times daily.   40 capsule   0   . warfarin (COUMADIN) 5 MG tablet   Oral   Take 5 mg by mouth daily at 6 PM.           Pulse 71  Temp(Src) 98.3 F (36.8 C) (Oral)  Resp 20  Wt 412 lb (186.882 kg)  SpO2 99%  LMP 08/14/2013 Physical Exam  Nursing note and vitals reviewed. Constitutional: She appears well-developed and well-nourished. No distress.  Morbidly obese, in no acute distress  HENT:  Head: Normocephalic and atraumatic.  Mouth/Throat: Oropharynx is clear and moist. No oropharyngeal exudate.  Eyes: Conjunctivae and EOM are normal. Pupils are equal, round, and reactive to light. Right eye exhibits no discharge. Left eye exhibits no discharge. No scleral icterus.  Neck: Normal range of motion. Neck supple. No JVD present. No thyromegaly present.  Cardiovascular: Normal rate, regular rhythm, normal heart sounds and intact distal pulses.  Exam reveals no gallop and no friction rub.   No murmur heard. Pulmonary/Chest: Effort normal and breath sounds normal. No respiratory distress. She has no wheezes. She has no rales.  Abdominal: Soft. Bowel  sounds are normal. She exhibits no distension and no mass. There is tenderness ( Epigastric, right upper  and left upper quadrant tenderness, no lower abdominal tenderness).  Musculoskeletal: Normal range of motion. She exhibits edema. She exhibits no tenderness.  Lymphadenopathy:    She has no cervical adenopathy.  Neurological: She is alert. Coordination normal.  Skin: Skin is warm and dry. No rash noted. There is erythema.  Psychiatric: She has a normal mood and affect. Her behavior is normal.    ED Course  Procedures (including critical care time) Labs Review Labs Reviewed  URINALYSIS, ROUTINE W REFLEX MICROSCOPIC - Abnormal; Notable for the following:    Leukocytes, UA TRACE (*)    All other components within normal limits  COMPREHENSIVE METABOLIC PANEL - Abnormal; Notable for the following:    Glucose, Bld 113 (*)    Creatinine, Ser 1.16 (*)    AST 75 (*)    ALT 135 (*)    GFR calc non Af Amer 54 (*)    GFR calc Af Amer 63 (*)    All other components within normal limits  URINE MICROSCOPIC-ADD ON - Abnormal; Notable for the following:    Squamous Epithelial / LPF MANY (*)    Bacteria, UA FEW (*)    All other components within normal limits  PROTIME-INR - Abnormal; Notable for the following:    Prothrombin Time 29.5 (*)    INR 2.93 (*)    All other components within normal limits  CLOSTRIDIUM DIFFICILE BY PCR  URINE CULTURE  CBC WITH DIFFERENTIAL  LIPASE, BLOOD   Imaging Review Dg Abd Acute W/chest  09/09/2013   CLINICAL DATA:  C difficile colitis, worsening abdominal pain and persistent diarrhea.  EXAM: ACUTE ABDOMEN SERIES (ABDOMEN 2 VIEW & CHEST 1 VIEW)  COMPARISON:  CT abdomen pelvis September 02, 2013.  FINDINGS: Cardiomediastinal silhouette is unremarkable. Lungs are clear. No pneumothorax. Soft tissue planes and included osseous structures are nonsuspicious, mild degenerative change of thoracic spine.  Multiple loops of gas-filled mildly distended small and large bowel,  large amount of retained large bowel stool. No intra-abdominal pathologic calcifications. No free air. Surgical clips in the right abdomen likely reflect cholecystectomy. The liver may be mildly enlarged. Severe degenerative change of the right hip. Soft tissue planes are nonsuspicious.  IMPRESSION: No acute cardiopulmonary process.  Mildly gas distended small and large bowel may reflect ileus, with a large amount of retained large bowel stool.   Electronically Signed   By: Awilda Metro   On: 09/09/2013 06:58    EKG Interpretation   None       MDM   1. Abdominal pain   2. Transaminitis    The patient's lower extremities are chronically swollen with large pannus, her right lower extremity has a chronically swollen and gradually growing mass off of the medial aspect of the thigh. This has been evaluated with CT scans, ultrasounds and found to have intermittent cellulitis. This does not appear different today and is not the reason for the patient's visit. Given her ongoing diarrhea and increased abdominal pain she would benefit from laboratory workup as well as an acute abdominal series to evaluate for bowel obstruction given the absence of nausea and vomiting this would be less likely. This may be related to ongoing C. difficile colitis, would consider recurrent pancreatitis though less likely, peptic ulcer disease, unlikely to be a perforated bowel or stomach given her lack of peritoneal signs on exam.  The patient declines intravenous opiates, requests oral Vicodin  Labs suggest a slight transaminitis, no leukocytosis, no urinary tract infection and no pancreatitis.  X-ray shows possible ileus, large stool Burden in the colon, the patient has been made aware of these findings, will be prescribed medications as below and is agreeable to return if sx shoule worsen.  Meds given in ED:  Medications  sodium chloride 0.9 % bolus 1,000 mL (0 mLs Intravenous Stopped 09/09/13 0619)   HYDROcodone-acetaminophen (NORCO/VICODIN) 5-325 MG per tablet 2 tablet (2 tablets Oral Given 09/09/13 0455)    New Prescriptions   HYDROCODONE-ACETAMINOPHEN (NORCO/VICODIN) 5-325 MG PER TABLET    Take 2 tablets by mouth every 4 (four) hours as needed.      Johnna Acosta, MD 09/09/13 [phone]

## 2013-09-09 NOTE — ED Notes (Signed)
Pt reports contacted sister and she is on the way to pick up pt.

## 2013-09-09 NOTE — ED Notes (Signed)
Pt states she recently finished antibiotics for cdiff and h pylori,  States she is having worsening abd pain and continues to have diarrhea.

## 2013-09-09 NOTE — Discharge Instructions (Signed)
Your x-ray reveals that you have a large amount of stool in your colon. It also shows that you have some gas in your small bowel, this is known as an ileus (sleepy bowel), please take medications as prescribed, this includes hydrocodone for pain however this can slowly or bowels down and cause worsening pain, constipation and abdominal swelling. You may want to take a bowel stool softener to help prevent worsening constipation if taking the pain medication.  Remembered have her doctor check your liver tests within one week.  Please call your doctor for a followup appointment within 24-48 hours. When you talk to your doctor please let them know that you were seen in the emergency department and have them acquire all of your records so that they can discuss the findings with you and formulate a treatment plan to fully care for your new and ongoing problems.

## 2013-09-10 LAB — URINE CULTURE

## 2013-09-14 ENCOUNTER — Emergency Department: Payer: Self-pay | Admitting: Emergency Medicine

## 2013-09-14 LAB — COMPREHENSIVE METABOLIC PANEL
ANION GAP: 9 (ref 7–16)
AST: 39 U/L — AB (ref 15–37)
Albumin: 4.3 g/dL (ref 3.4–5.0)
Alkaline Phosphatase: 109 U/L
BUN: 18 mg/dL (ref 7–18)
Bilirubin,Total: 0.7 mg/dL (ref 0.2–1.0)
CHLORIDE: 101 mmol/L (ref 98–107)
CREATININE: 1.31 mg/dL — AB (ref 0.60–1.30)
Calcium, Total: 10.3 mg/dL — ABNORMAL HIGH (ref 8.5–10.1)
Co2: 24 mmol/L (ref 21–32)
EGFR (African American): 55 — ABNORMAL LOW
EGFR (Non-African Amer.): 47 — ABNORMAL LOW
Glucose: 92 mg/dL (ref 65–99)
Osmolality: 270 (ref 275–301)
Potassium: 3.7 mmol/L (ref 3.5–5.1)
SGPT (ALT): 72 U/L (ref 12–78)
Sodium: 134 mmol/L — ABNORMAL LOW (ref 136–145)
Total Protein: 8.2 g/dL (ref 6.4–8.2)

## 2013-09-14 LAB — PROTIME-INR
INR: 2.1
Prothrombin Time: 23.1 secs — ABNORMAL HIGH (ref 11.5–14.7)

## 2013-09-14 LAB — CBC
HCT: 37.4 % (ref 35.0–47.0)
HGB: 12.7 g/dL (ref 12.0–16.0)
MCH: 28.7 pg (ref 26.0–34.0)
MCHC: 33.9 g/dL (ref 32.0–36.0)
MCV: 85 fL (ref 80–100)
Platelet: 307 10*3/uL (ref 150–440)
RBC: 4.41 10*6/uL (ref 3.80–5.20)
RDW: 14 % (ref 11.5–14.5)
WBC: 11.4 10*3/uL — ABNORMAL HIGH (ref 3.6–11.0)

## 2013-10-25 ENCOUNTER — Emergency Department: Payer: Self-pay | Admitting: Emergency Medicine

## 2013-10-25 LAB — BASIC METABOLIC PANEL
Anion Gap: 7 (ref 7–16)
BUN: 29 mg/dL — ABNORMAL HIGH (ref 7–18)
Calcium, Total: 9.9 mg/dL (ref 8.5–10.1)
Chloride: 102 mmol/L (ref 98–107)
Co2: 26 mmol/L (ref 21–32)
Creatinine: 1.34 mg/dL — ABNORMAL HIGH (ref 0.60–1.30)
EGFR (African American): 53 — ABNORMAL LOW
EGFR (Non-African Amer.): 46 — ABNORMAL LOW
Glucose: 93 mg/dL (ref 65–99)
OSMOLALITY: 276 (ref 275–301)
POTASSIUM: 3.9 mmol/L (ref 3.5–5.1)
Sodium: 135 mmol/L — ABNORMAL LOW (ref 136–145)

## 2013-10-25 LAB — CBC
HCT: 34.9 % — AB (ref 35.0–47.0)
HGB: 11.8 g/dL — ABNORMAL LOW (ref 12.0–16.0)
MCH: 28.9 pg (ref 26.0–34.0)
MCHC: 33.7 g/dL (ref 32.0–36.0)
MCV: 86 fL (ref 80–100)
PLATELETS: 265 10*3/uL (ref 150–440)
RBC: 4.08 10*6/uL (ref 3.80–5.20)
RDW: 13.8 % (ref 11.5–14.5)
WBC: 9.3 10*3/uL (ref 3.6–11.0)

## 2013-10-25 LAB — TROPONIN I
Troponin-I: <0.02 ng/mL
Troponin-I: <0.02 ng/mL

## 2013-10-25 LAB — PROTIME-INR
INR: 1.9
Prothrombin Time: 21.5 secs — ABNORMAL HIGH (ref 11.5–14.7)

## 2013-10-26 ENCOUNTER — Encounter: Payer: Self-pay | Admitting: Cardiology

## 2013-11-06 ENCOUNTER — Emergency Department (HOSPITAL_COMMUNITY): Payer: Medicaid Other

## 2013-11-06 ENCOUNTER — Emergency Department (HOSPITAL_COMMUNITY)
Admission: EM | Admit: 2013-11-06 | Discharge: 2013-11-06 | Disposition: A | Discharge status: Home / Self Care | Payer: Medicaid Other | Attending: Emergency Medicine | Admitting: Emergency Medicine

## 2013-11-06 ENCOUNTER — Encounter (HOSPITAL_COMMUNITY): Payer: Self-pay | Admitting: Emergency Medicine

## 2013-11-06 DIAGNOSIS — F329 Major depressive disorder, single episode, unspecified: Secondary | ICD-10-CM | POA: Insufficient documentation

## 2013-11-06 DIAGNOSIS — Z8739 Personal history of other diseases of the musculoskeletal system and connective tissue: Secondary | ICD-10-CM | POA: Insufficient documentation

## 2013-11-06 DIAGNOSIS — Z872 Personal history of diseases of the skin and subcutaneous tissue: Secondary | ICD-10-CM | POA: Insufficient documentation

## 2013-11-06 DIAGNOSIS — M79609 Pain in unspecified limb: Secondary | ICD-10-CM | POA: Insufficient documentation

## 2013-11-06 DIAGNOSIS — F3289 Other specified depressive episodes: Secondary | ICD-10-CM | POA: Insufficient documentation

## 2013-11-06 DIAGNOSIS — J45901 Unspecified asthma with (acute) exacerbation: Secondary | ICD-10-CM | POA: Insufficient documentation

## 2013-11-06 DIAGNOSIS — Z7901 Long term (current) use of anticoagulants: Secondary | ICD-10-CM | POA: Insufficient documentation

## 2013-11-06 DIAGNOSIS — Z862 Personal history of diseases of the blood and blood-forming organs and certain disorders involving the immune mechanism: Secondary | ICD-10-CM | POA: Insufficient documentation

## 2013-11-06 DIAGNOSIS — G4733 Obstructive sleep apnea (adult) (pediatric): Secondary | ICD-10-CM | POA: Insufficient documentation

## 2013-11-06 DIAGNOSIS — Z792 Long term (current) use of antibiotics: Secondary | ICD-10-CM | POA: Insufficient documentation

## 2013-11-06 DIAGNOSIS — Z86718 Personal history of other venous thrombosis and embolism: Secondary | ICD-10-CM | POA: Insufficient documentation

## 2013-11-06 DIAGNOSIS — Z79899 Other long term (current) drug therapy: Secondary | ICD-10-CM | POA: Insufficient documentation

## 2013-11-06 DIAGNOSIS — E669 Obesity, unspecified: Secondary | ICD-10-CM | POA: Insufficient documentation

## 2013-11-06 DIAGNOSIS — Z8719 Personal history of other diseases of the digestive system: Secondary | ICD-10-CM | POA: Insufficient documentation

## 2013-11-06 DIAGNOSIS — E119 Type 2 diabetes mellitus without complications: Secondary | ICD-10-CM | POA: Insufficient documentation

## 2013-11-06 DIAGNOSIS — I10 Essential (primary) hypertension: Secondary | ICD-10-CM | POA: Insufficient documentation

## 2013-11-06 DIAGNOSIS — Z8619 Personal history of other infectious and parasitic diseases: Secondary | ICD-10-CM | POA: Insufficient documentation

## 2013-11-06 DIAGNOSIS — M79606 Pain in leg, unspecified: Secondary | ICD-10-CM

## 2013-11-06 LAB — CBC WITH DIFFERENTIAL/PLATELET
BASOS ABS: 0 10*3/uL (ref 0.0–0.1)
Basophils Relative: 1 % (ref 0–1)
EOS PCT: 1 % (ref 0–5)
Eosinophils Absolute: 0.1 10*3/uL (ref 0.0–0.7)
HEMATOCRIT: 32.1 % — AB (ref 36.0–46.0)
HEMOGLOBIN: 11 g/dL — AB (ref 12.0–15.0)
LYMPHS PCT: 27 % (ref 12–46)
Lymphs Abs: 1.8 10*3/uL (ref 0.7–4.0)
MCH: 29.3 pg (ref 26.0–34.0)
MCHC: 34.3 g/dL (ref 30.0–36.0)
MCV: 85.6 fL (ref 78.0–100.0)
MONOS PCT: 7 % (ref 3–12)
Monocytes Absolute: 0.5 10*3/uL (ref 0.1–1.0)
NEUTROS ABS: 4.2 10*3/uL (ref 1.7–7.7)
Neutrophils Relative %: 64 % (ref 43–77)
Platelets: 243 10*3/uL (ref 150–400)
RBC: 3.75 MIL/uL — AB (ref 3.87–5.11)
RDW: 13.7 % (ref 11.5–15.5)
WBC: 6.5 10*3/uL (ref 4.0–10.5)

## 2013-11-06 LAB — COMPREHENSIVE METABOLIC PANEL
ALT: 20 U/L (ref 0–35)
AST: 20 U/L (ref 0–37)
Albumin: 3.9 g/dL (ref 3.5–5.2)
Alkaline Phosphatase: 76 U/L (ref 39–117)
BILIRUBIN TOTAL: 0.5 mg/dL (ref 0.3–1.2)
BUN: 21 mg/dL (ref 6–23)
CHLORIDE: 100 meq/L (ref 96–112)
CO2: 24 meq/L (ref 19–32)
Calcium: 10 mg/dL (ref 8.4–10.5)
Creatinine, Ser: 1.1 mg/dL (ref 0.50–1.10)
GFR calc non Af Amer: 58 mL/min — ABNORMAL LOW (ref >90)
GFR, EST AFRICAN AMERICAN: 67 mL/min — AB (ref >90)
Glucose, Bld: 95 mg/dL (ref 70–99)
Potassium: 3.9 mEq/L (ref 3.7–5.3)
Sodium: 139 mEq/L (ref 137–147)
Total Protein: 7.5 g/dL (ref 6.0–8.3)

## 2013-11-06 LAB — PROTIME-INR
INR: 2 — ABNORMAL HIGH (ref 0.00–1.49)
Prothrombin Time: 22.1 seconds — ABNORMAL HIGH (ref 11.6–15.2)

## 2013-11-06 MED ORDER — IOHEXOL 350 MG/ML SOLN
100.0000 mL | Freq: Once | INTRAVENOUS | Status: AC | PRN
  Administered 2013-11-06: 100 mL via INTRAVENOUS

## 2013-11-06 MED ORDER — ONDANSETRON HCL 4 MG/2ML IJ SOLN
4.0000 mg | Freq: Once | INTRAMUSCULAR | Status: AC
  Administered 2013-11-06: 4 mg via INTRAVENOUS
  Filled 2013-11-06: qty 2

## 2013-11-06 MED ORDER — OXYCODONE-ACETAMINOPHEN 5-325 MG PO TABS: 1.0000 | ORAL_TABLET | Freq: Four times a day (QID) | ORAL | Status: DC | PRN

## 2013-11-06 NOTE — Discharge Instructions (Signed)
Follow up with your md next week. °

## 2013-11-06 NOTE — ED Notes (Signed)
Pt states left lower leg pain. Hx of DVT and PE dx 5 mo ago. States new pain in left leg is the same as the pain from DVT in right leg.

## 2013-11-06 NOTE — ED Notes (Signed)
Patient states CT contrast makes her nauseous. MD aware. Verbal order obtained for 4 mg Zofran.

## 2013-11-06 NOTE — ED Provider Notes (Signed)
CSN: [id number]     Arrival date & time 11/06/13  1534 History   First MD Initiated Contact with Patient 11/06/13 1548    This chart was scribed for Maudry Diego, MD by Terressa Koyanagi, ED Scribe. This patient was seen in room APA18/APA18 and the patient's care was started at 3:59 PM.  PCP: Lorelee Market, MD  Chief Complaint  Patient presents with  . Leg Pain   Patient is a 53 y.o. female presenting with leg pain. The history is provided by the patient. No language interpreter was used.  Leg Pain Location:  Leg Leg location:  L lower leg Pain details:    Quality:  Aching   Radiates to:  Does not radiate   Severity:  Moderate   Timing:  Intermittent Chronicity:  Recurrent Associated symptoms: no back pain and no fatigue   Risk factors: obesity and recent illness (PE diagnosed 5 months ago)    HPI Comments: Stacey [name] is a 53 y.o. female, with a history of HTN, DM, PE, musculoskeletal pain, and DVT who presents to the Emergency Department complaining of intermittent lower left leg pain. Pt also complains of associated SOB.    Past Medical History  Diagnosis Date  . Hypertension   . Depression   . Type 2 diabetes mellitus   . Diverticulitis   . Asthma   . Cyst     pt has "cyst" to legs for years, areas drain at time  . Pancreatitis   . Arthritis   . C. difficile colitis 11/08/2011  . Musculoskeletal pain 11/08/2011  . Neuropathic pain 11/10/2011  . Sleep apnea with use of continuous positive airway pressure (CPAP)     uses CPAP at night  . 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 DVT (deep venous thrombosis)    Past Surgical History  Procedure Laterality Date  . Tonsillectomy    . Cholecystectomy    . Cesarean section    . Nose surgery     No family history on file. History  Substance Use Topics  . Smoking status: Never Smoker   . Smokeless tobacco: Never Used  . Alcohol Use: No   OB History   Grav Para Term Preterm Abortions  TAB SAB Ect Mult Living                 Review of Systems  Constitutional: Negative for appetite change and fatigue.  HENT: Negative for congestion, ear discharge and sinus pressure.   Eyes: Negative for discharge.  Respiratory: Positive for shortness of breath. Negative for cough.   Cardiovascular: Negative for chest pain.  Gastrointestinal: Negative for abdominal pain and diarrhea.  Genitourinary: Negative for frequency and hematuria.  Musculoskeletal: Positive for myalgias (Pain in left calf). Negative for back pain.  Skin: Negative for rash.  Neurological: Negative for seizures and headaches.  Psychiatric/Behavioral: Negative for hallucinations.      Allergies  Nitroglycerin; Tramadol; Dilaudid; Levaquin; Trazodone and nefazodone; and Sulfa antibiotics  Home Medications   Current Outpatient Rx  Name  Route  Sig  Dispense  Refill  . acetaminophen (TYLENOL) 500 MG tablet   Oral   Take 500 mg by mouth every 6 (six) hours as needed for mild pain.         Marland Kitchen albuterol (PROVENTIL HFA;VENTOLIN HFA) 108 (90 BASE) MCG/ACT inhaler   Inhalation   Inhale 2 puffs into the lungs every 4 (four) hours as needed for wheezing or shortness of breath.  1 Inhaler   0   . buPROPion (WELLBUTRIN XL) 300 MG 24 hr tablet   Oral   Take 300 mg by mouth daily.          . busPIRone (BUSPAR) 15 MG tablet   Oral   Take 2 tablets (30 mg total) by mouth 2 (two) times daily.   120 tablet   1   . clarithromycin (BIAXIN) 500 MG tablet   Oral   Take 1 tablet (500 mg total) by mouth 2 (two) times daily.   19 tablet   0   . diphenoxylate-atropine (LOMOTIL) 2.5-0.025 MG per tablet   Oral   Take 1 tablet by mouth 4 (four) times daily as needed for diarrhea or loose stools.   20 tablet   0   . escitalopram (LEXAPRO) 20 MG tablet   Oral   Take 20 mg by mouth daily.           . fluconazole (DIFLUCAN) 150 MG tablet   Oral   Take 1 tablet (150 mg total) by mouth once.   4 tablet   0    . glimepiride (AMARYL) 4 MG tablet   Oral   Take 4 mg by mouth daily with breakfast.         . hydrochlorothiazide (HYDRODIURIL) 25 MG tablet   Oral   Take 25 mg by mouth at bedtime.          Marland Kitchen HYDROcodone-acetaminophen (NORCO/VICODIN) 5-325 MG per tablet   Oral   Take 2 tablets by mouth every 4 (four) hours as needed.   10 tablet   0   . lisinopril (PRINIVIL,ZESTRIL) 10 MG tablet   Oral   Take 5 mg by mouth daily.          . metroNIDAZOLE (FLAGYL) 500 MG tablet   Oral   Take 1 tablet (500 mg total) by mouth 3 (three) times daily.   29 tablet   0   . nystatin (MYCOSTATIN) 100000 UNIT/ML suspension   Oral   Take 5 mLs (500,000 Units total) by mouth 4 (four) times daily.   100 mL   0   . pantoprazole (PROTONIX) 40 MG tablet   Oral   Take 1 tablet (40 mg total) by mouth 2 (two) times daily.   19 tablet   0   . vancomycin (VANCOCIN) 125 MG capsule   Oral   Take 1 capsule (125 mg total) by mouth 4 (four) times daily.   40 capsule   0   . warfarin (COUMADIN) 5 MG tablet   Oral   Take 5 mg by mouth daily at 6 PM.           Triage Vitals: BP 143/54  Pulse 69  Temp(Src) 98 F (36.7 C) (Oral)  Resp 18  Ht 5\' 7"  (1.702 m)  Wt 372 lb 5 oz (168.88 kg)  BMI 58.30 kg/m2  SpO2 100%  LMP 10/11/2013 Physical Exam  Constitutional: She is oriented to person, place, and time. She appears well-developed and well-nourished.  Obese  HENT:  Head: Normocephalic.  Eyes: Conjunctivae are normal.  Neck: No tracheal deviation present.  Cardiovascular:  No murmur heard. Pulmonary/Chest: She has no wheezes.  Abdominal: There is no tenderness.  Musculoskeletal: Normal range of motion. She exhibits tenderness (Pain and tenderness in left calf).  Neurological: She is oriented to person, place, and time.  Skin: Skin is warm.  Psychiatric: She has a normal mood and affect.    ED  Course  Procedures (including critical care time) DIAGNOSTIC STUDIES: Oxygen Saturation  is 100% on RA, normal by my interpretation.    COORDINATION OF CARE:  4:01 PM-Discussed treatment plan which includes imaging with pt at bedside and pt agreed to plan.   Labs Review Labs Reviewed - No data to display Imaging Review No results found.   EKG Interpretation None      MDM   Final diagnoses:  None   The chart was scribed for me under my direct supervision.  I personally performed the history, physical, and medical decision making and all procedures in the evaluation of this patient.Maudry Diego, MD 11/06/13 2002

## 2013-11-08 ENCOUNTER — Emergency Department: Payer: Self-pay | Admitting: Emergency Medicine

## 2013-11-08 LAB — CBC
HCT: 34.7 % — ABNORMAL LOW
HGB: 11.4 g/dL — ABNORMAL LOW
MCH: 28.4 pg
MCHC: 33 g/dL
MCV: 86 fL
Platelet: 249 10*3/uL
RBC: 4.02 X10 6/mm 3
RDW: 13.4 %
WBC: 8.6 10*3/uL

## 2013-11-08 LAB — BASIC METABOLIC PANEL WITH GFR
Anion Gap: 5 — ABNORMAL LOW
BUN: 22 mg/dL — ABNORMAL HIGH
Calcium, Total: 9.4 mg/dL
Chloride: 104 mmol/L
Co2: 26 mmol/L
Creatinine: 1.23 mg/dL
EGFR (African American): 59 — ABNORMAL LOW
EGFR (Non-African Amer.): 51 — ABNORMAL LOW
Glucose: 91 mg/dL
Osmolality: 273
Potassium: 3.9 mmol/L
Sodium: 135 mmol/L — ABNORMAL LOW

## 2013-11-08 LAB — PROTIME-INR
INR: 2.3
Prothrombin Time: 24.8 s — ABNORMAL HIGH

## 2013-11-08 LAB — APTT: Activated PTT: 43.5 s — ABNORMAL HIGH

## 2013-11-28 ENCOUNTER — Encounter (HOSPITAL_COMMUNITY): Payer: Self-pay | Admitting: Emergency Medicine

## 2013-11-28 ENCOUNTER — Emergency Department (HOSPITAL_COMMUNITY)
Admission: EM | Admit: 2013-11-28 | Discharge: 2013-11-28 | Disposition: A | Discharge status: Home / Self Care | Payer: Medicaid Other | Attending: Emergency Medicine | Admitting: Emergency Medicine

## 2013-11-28 DIAGNOSIS — K0889 Other specified disorders of teeth and supporting structures: Secondary | ICD-10-CM

## 2013-11-28 DIAGNOSIS — IMO0001 Reserved for inherently not codable concepts without codable children: Secondary | ICD-10-CM | POA: Insufficient documentation

## 2013-11-28 DIAGNOSIS — Z8719 Personal history of other diseases of the digestive system: Secondary | ICD-10-CM | POA: Insufficient documentation

## 2013-11-28 DIAGNOSIS — Z79899 Other long term (current) drug therapy: Secondary | ICD-10-CM | POA: Insufficient documentation

## 2013-11-28 DIAGNOSIS — Z872 Personal history of diseases of the skin and subcutaneous tissue: Secondary | ICD-10-CM | POA: Insufficient documentation

## 2013-11-28 DIAGNOSIS — F3289 Other specified depressive episodes: Secondary | ICD-10-CM | POA: Insufficient documentation

## 2013-11-28 DIAGNOSIS — R21 Rash and other nonspecific skin eruption: Secondary | ICD-10-CM | POA: Insufficient documentation

## 2013-11-28 DIAGNOSIS — F329 Major depressive disorder, single episode, unspecified: Secondary | ICD-10-CM | POA: Insufficient documentation

## 2013-11-28 DIAGNOSIS — Z9981 Dependence on supplemental oxygen: Secondary | ICD-10-CM | POA: Insufficient documentation

## 2013-11-28 DIAGNOSIS — Z86718 Personal history of other venous thrombosis and embolism: Secondary | ICD-10-CM | POA: Insufficient documentation

## 2013-11-28 DIAGNOSIS — I10 Essential (primary) hypertension: Secondary | ICD-10-CM | POA: Insufficient documentation

## 2013-11-28 DIAGNOSIS — G4730 Sleep apnea, unspecified: Secondary | ICD-10-CM | POA: Insufficient documentation

## 2013-11-28 DIAGNOSIS — K089 Disorder of teeth and supporting structures, unspecified: Secondary | ICD-10-CM | POA: Insufficient documentation

## 2013-11-28 DIAGNOSIS — E119 Type 2 diabetes mellitus without complications: Secondary | ICD-10-CM | POA: Insufficient documentation

## 2013-11-28 DIAGNOSIS — J45909 Unspecified asthma, uncomplicated: Secondary | ICD-10-CM | POA: Insufficient documentation

## 2013-11-28 DIAGNOSIS — Z8739 Personal history of other diseases of the musculoskeletal system and connective tissue: Secondary | ICD-10-CM | POA: Insufficient documentation

## 2013-11-28 DIAGNOSIS — IMO0002 Reserved for concepts with insufficient information to code with codable children: Secondary | ICD-10-CM | POA: Insufficient documentation

## 2013-11-28 LAB — PROTIME-INR
INR: 2.33 — ABNORMAL HIGH (ref 0.00–1.49)
Prothrombin Time: 24.8 seconds — ABNORMAL HIGH (ref 11.6–15.2)

## 2013-11-28 MED ORDER — METRONIDAZOLE 500 MG PO TABS: 500.0000 mg | ORAL_TABLET | Freq: Three times a day (TID) | ORAL | Status: DC

## 2013-11-28 MED ORDER — HYDROCODONE-ACETAMINOPHEN 5-325 MG PO TABS: 1.0000 | ORAL_TABLET | Freq: Four times a day (QID) | ORAL | Status: DC | PRN

## 2013-11-28 MED ORDER — CLOTRIMAZOLE-BETAMETHASONE 1-0.05 % EX CREA: TOPICAL_CREAM | CUTANEOUS | Status: DC

## 2013-11-28 NOTE — Discharge Instructions (Signed)
Follow up with your md next week. °

## 2013-11-28 NOTE — ED Notes (Signed)
MD at bedside. 

## 2013-11-28 NOTE — ED Provider Notes (Signed)
CSN: [id number]     Arrival date & time 11/28/13  0957 History  This chart was scribed for Stacey Diego, MD by Ludger Nutting, ED Scribe. This patient was seen in room APA08/APA08 and the patient's care was started 11:47 AM.    Chief Complaint  Patient presents with  . Dental Pain      Patient is a 53 y.o. female presenting with tooth pain. The history is provided by the patient. No language interpreter was used.  Dental Pain Location:  Lower Lower teeth location:  30/RL 1st molar Quality:  Constant Severity:  Moderate Onset quality:  Gradual Duration:  1 month Timing:  Constant Progression:  Worsening Chronicity:  New Relieved by:  Nothing Associated symptoms: no congestion and no headaches   Risk factors: diabetes     HPI Comments: Stacey [name] is a 53 y.o. female who presents to the Emergency Department complaining of 1 month of gradual onset, gradually worsening, constant right upper dental pain. She denies fever, chills, nausea.   She also complains of 2 areas of redness, itching, and swelling to the right breast beginning a few days ago. She has associated pain and yellow discharge. She has applied hydrocortisone without relief.    Past Medical History  Diagnosis Date  . Hypertension   . Depression   . Type 2 diabetes mellitus   . Diverticulitis   . Asthma   . Cyst     pt has "cyst" to legs for years, areas drain at time  . Pancreatitis   . Arthritis   . C. difficile colitis 11/08/2011  . Musculoskeletal pain 11/08/2011  . Neuropathic pain 11/10/2011  . Sleep apnea with use of continuous positive airway pressure (CPAP)     uses CPAP at night  . 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 DVT (deep venous thrombosis)    Past Surgical History  Procedure Laterality Date  . Tonsillectomy    . Cholecystectomy    . Cesarean section    . Nose surgery     History reviewed. No pertinent family history. History  Substance Use Topics  .  Smoking status: Never Smoker   . Smokeless tobacco: Never Used  . Alcohol Use: No   OB History   Grav Para Term Preterm Abortions TAB SAB Ect Mult Living                 Review of Systems  Constitutional: Negative for appetite change and fatigue.  HENT: Positive for dental problem. Negative for congestion, ear discharge and sinus pressure.   Eyes: Negative for discharge.  Respiratory: Negative for cough.   Cardiovascular: Negative for chest pain.  Gastrointestinal: Negative for abdominal pain and diarrhea.  Genitourinary: Negative for frequency and hematuria.  Musculoskeletal: Negative for back pain.  Skin: Positive for rash (left breast ).  Neurological: Negative for seizures and headaches.  Psychiatric/Behavioral: Negative for hallucinations.      Allergies  Nitroglycerin; Tramadol; Dilaudid; Levaquin; Trazodone and nefazodone; and Sulfa antibiotics  Home Medications   Prior to Admission medications   Medication Sig Start Date End Date Taking? Authorizing Provider  acetaminophen (TYLENOL) 500 MG tablet Take 500 mg by mouth every 6 (six) hours as needed for mild pain.   Yes Historical Provider, MD  albuterol (PROVENTIL HFA;VENTOLIN HFA) 108 (90 BASE) MCG/ACT inhaler Inhale 2 puffs into the lungs every 4 (four) hours as needed for wheezing or shortness of breath. 07/19/13  Yes Michiel Cowboy, MD  buPROPion (WELLBUTRIN XL) 300 MG 24 hr tablet Take 300 mg by mouth daily.    Yes Historical Provider, MD  busPIRone (BUSPAR) 15 MG tablet Take 2 tablets (30 mg total) by mouth 2 (two) times daily. 08/15/13  Yes Rebecca Eaton, MD  escitalopram (LEXAPRO) 20 MG tablet Take 20 mg by mouth daily.     Yes Historical Provider, MD  hydrochlorothiazide (HYDRODIURIL) 25 MG tablet Take 25 mg by mouth at bedtime.    Yes Historical Provider, MD  lisinopril (PRINIVIL,ZESTRIL) 10 MG tablet Take 5 mg by mouth daily.    Yes Historical Provider, MD  oxyCODONE-acetaminophen (PERCOCET/ROXICET) 5-325  MG per tablet Take 1 tablet by mouth every 6 (six) hours as needed for severe pain. 11/06/13  Yes Stacey Diego, MD  warfarin (COUMADIN) 5 MG tablet Take 5-7.5 mg by mouth See admin instructions. Patient alternates between 5mg  and 7.5mg  Patient had 7.5mg  on(11/05/13)   Yes Historical Provider, MD   BP 136/76  Pulse 73  Temp(Src) 97.6 F (36.4 C) (Oral)  Resp 18  Ht 5\' 6"  (1.676 m)  Wt 366 lb (166.017 kg)  BMI 59.10 kg/m2  SpO2 100%  LMP 10/24/2013 Physical Exam  Nursing note and vitals reviewed. Constitutional: She is oriented to person, place, and time. She appears well-developed.  HENT:  Head: Normocephalic.  Mouth/Throat: Oropharynx is clear and moist. No oropharyngeal exudate.  Tenderness to the right lower first molar   Eyes: Conjunctivae are normal.  Neck: No tracheal deviation present.  Cardiovascular: Normal rate.   No murmur heard. Pulmonary/Chest: Effort normal.  3 cm area of erythema on left breast at the 9-10 o'clock position with 2 blisters.   Abdominal: She exhibits no distension.  Musculoskeletal: Normal range of motion.  Neurological: She is oriented to person, place, and time.  Skin: Skin is warm.  Psychiatric: She has a normal mood and affect.    ED Course  Procedures (including critical care time)  DIAGNOSTIC STUDIES: Oxygen Saturation is 100% on RA, normal by my interpretation.    COORDINATION OF CARE: 11:54 AM Discussed treatment plan with pt at bedside and pt agreed to plan.   Labs Review Labs Reviewed - No data to display  Imaging Review No results found.   EKG Interpretation None      MDM   Final diagnoses:  None    Toothache.  Pt states only antibiotic which does not give her c-diff is flagyl.    She is to follow up with her md The chart was scribed for me under my direct supervision.  I personally performed the history, physical, and medical decision making and all procedures in the evaluation of this patient.Stacey Diego,  MD 11/28/13 [phone]

## 2013-11-28 NOTE — ED Notes (Addendum)
Pt reports dental pain x1 month and left breast pain x3 days. Pt reports has two "blisters" on left breast that has had yellow discharge from site. Pt denies any fever,n/v/d. nad noted. Pt reports is on blood thinner for DVT in RLE.

## 2013-12-13 ENCOUNTER — Emergency Department: Payer: Self-pay | Admitting: Emergency Medicine

## 2013-12-14 ENCOUNTER — Encounter (HOSPITAL_COMMUNITY): Payer: Self-pay | Admitting: Emergency Medicine

## 2013-12-14 ENCOUNTER — Emergency Department (HOSPITAL_COMMUNITY)
Admission: EM | Admit: 2013-12-14 | Discharge: 2013-12-14 | Disposition: A | Discharge status: Home / Self Care | Payer: Medicaid Other | Attending: Emergency Medicine | Admitting: Emergency Medicine

## 2013-12-14 DIAGNOSIS — Z8739 Personal history of other diseases of the musculoskeletal system and connective tissue: Secondary | ICD-10-CM | POA: Insufficient documentation

## 2013-12-14 DIAGNOSIS — Z792 Long term (current) use of antibiotics: Secondary | ICD-10-CM | POA: Insufficient documentation

## 2013-12-14 DIAGNOSIS — F3289 Other specified depressive episodes: Secondary | ICD-10-CM | POA: Insufficient documentation

## 2013-12-14 DIAGNOSIS — Z8619 Personal history of other infectious and parasitic diseases: Secondary | ICD-10-CM | POA: Insufficient documentation

## 2013-12-14 DIAGNOSIS — I10 Essential (primary) hypertension: Secondary | ICD-10-CM | POA: Insufficient documentation

## 2013-12-14 DIAGNOSIS — Z7901 Long term (current) use of anticoagulants: Secondary | ICD-10-CM | POA: Insufficient documentation

## 2013-12-14 DIAGNOSIS — Z8669 Personal history of other diseases of the nervous system and sense organs: Secondary | ICD-10-CM | POA: Insufficient documentation

## 2013-12-14 DIAGNOSIS — J45909 Unspecified asthma, uncomplicated: Secondary | ICD-10-CM | POA: Insufficient documentation

## 2013-12-14 DIAGNOSIS — E119 Type 2 diabetes mellitus without complications: Secondary | ICD-10-CM | POA: Insufficient documentation

## 2013-12-14 DIAGNOSIS — M129 Arthropathy, unspecified: Secondary | ICD-10-CM | POA: Insufficient documentation

## 2013-12-14 DIAGNOSIS — K029 Dental caries, unspecified: Secondary | ICD-10-CM | POA: Insufficient documentation

## 2013-12-14 DIAGNOSIS — Z86718 Personal history of other venous thrombosis and embolism: Secondary | ICD-10-CM | POA: Insufficient documentation

## 2013-12-14 DIAGNOSIS — K089 Disorder of teeth and supporting structures, unspecified: Secondary | ICD-10-CM | POA: Insufficient documentation

## 2013-12-14 DIAGNOSIS — Z79899 Other long term (current) drug therapy: Secondary | ICD-10-CM | POA: Insufficient documentation

## 2013-12-14 DIAGNOSIS — F329 Major depressive disorder, single episode, unspecified: Secondary | ICD-10-CM | POA: Insufficient documentation

## 2013-12-14 DIAGNOSIS — Z872 Personal history of diseases of the skin and subcutaneous tissue: Secondary | ICD-10-CM | POA: Insufficient documentation

## 2013-12-14 DIAGNOSIS — G8929 Other chronic pain: Secondary | ICD-10-CM

## 2013-12-14 MED ORDER — OXYCODONE-ACETAMINOPHEN 5-325 MG PO TABS: 1.0000 | ORAL_TABLET | ORAL | Status: DC | PRN

## 2013-12-14 MED ORDER — CLINDAMYCIN HCL 150 MG PO CAPS: 150.0000 mg | ORAL_CAPSULE | Freq: Four times a day (QID) | ORAL | Status: DC

## 2013-12-14 NOTE — ED Notes (Addendum)
Patient c/o right lower dental pain that radiates into throat, ear, and head since yesterday. Patient states she has broken tooth that needs to be pulled but can't until she is complete off her coumadin. Per patient was treated 2 weeks ago for same pain and given hydrocodone in which she took last night for first time and had an allergic reaction. Per patient treated at [facility] last night for allergic reaction.

## 2013-12-14 NOTE — ED Notes (Signed)
Patient with no complaints at this time. Respirations even and unlabored. Skin warm/dry. Discharge instructions reviewed with patient at this time. Patient given opportunity to voice concerns/ask questions. Patient discharged at this time and left Emergency Department with steady gait.   

## 2013-12-14 NOTE — ED Provider Notes (Signed)
CSN: [id number]     Arrival date & time 12/14/13  1027 History   First MD Initiated Contact with Patient 12/14/13 1008     Chief Complaint  Patient presents with  . Dental Pain     (Consider location/radiation/quality/duration/timing/severity/associated sxs/prior Treatment) Patient is a 53 y.o. female presenting with tooth pain. The history is provided by the patient.  Dental Pain Location:  Lower Lower teeth location:  30/RL 1st molar Quality:  Throbbing Onset quality:  Gradual Duration:  2 weeks Timing:  Constant Progression:  Worsening Context: dental fracture   Relieved by:  Nothing Worsened by:  Cold food/drink, touching and pressure Ineffective treatments:  Acetaminophen Associated symptoms: no congestion, no fever and no neck swelling    Stacey [name] is a 53 y.o. female who presents to the ED with dental pain. The pain is located in the right lower dental area and radiates into the throat and right ear. She has a broken tooth that needs to be pulled but can't until she is completely off coumadin. She was treated here 2 weeks ago for same and treated with hydrocodone and took it last night for the first time and thinks she had an allergic reaction. He throat felt funny and felt tight. She went to [facility] last night and they gave her prednisone for possible allergic reaction. They would not give her oxycodone. She is here today for pain management.   Past Medical History  Diagnosis Date  . Hypertension   . Depression   . Type 2 diabetes mellitus   . Diverticulitis   . Asthma   . Cyst     pt has "cyst" to legs for years, areas drain at time  . Pancreatitis   . Arthritis   . C. difficile colitis 11/08/2011  . Musculoskeletal pain 11/08/2011  . Neuropathic pain 11/10/2011  . Sleep apnea with use of continuous positive airway pressure (CPAP)     uses CPAP at night  . 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 DVT (deep venous thrombosis)     Past Surgical History  Procedure Laterality Date  . Tonsillectomy    . Cholecystectomy    . Cesarean section    . Nose surgery     Family History  Problem Relation Age of Onset  . Stroke Other   . Cancer Other   . Diabetes Other    History  Substance Use Topics  . Smoking status: Never Smoker   . Smokeless tobacco: Never Used  . Alcohol Use: No   OB History   Grav Para Term Preterm Abortions TAB SAB Ect Mult Living   3 2 2  1  1   2      Review of Systems  Constitutional: Negative for fever.  HENT: Negative for congestion.       Allergies  Hydrocodone; Nitroglycerin; Tramadol; Dilaudid; Levaquin; Trazodone and nefazodone; and Sulfa antibiotics  Home Medications   Prior to Admission medications   Medication Sig Start Date End Date Taking? Authorizing Provider  acetaminophen (TYLENOL) 500 MG tablet Take 500 mg by mouth every 6 (six) hours as needed for mild pain.    Historical Provider, MD  albuterol (PROVENTIL HFA;VENTOLIN HFA) 108 (90 BASE) MCG/ACT inhaler Inhale 2 puffs into the lungs every 4 (four) hours as needed for wheezing or shortness of breath. 07/19/13   Michiel Cowboy, MD  buPROPion (WELLBUTRIN XL) 300 MG 24 hr tablet Take 300 mg by mouth daily.  Historical Provider, MD  busPIRone (BUSPAR) 15 MG tablet Take 2 tablets (30 mg total) by mouth 2 (two) times daily. 08/15/13   Rebecca Eaton, MD  clotrimazole-betamethasone (LOTRISONE) cream Apply to affected area 2 times daily prn 11/28/13   Maudry Diego, MD  escitalopram (LEXAPRO) 20 MG tablet Take 20 mg by mouth daily.      Historical Provider, MD  hydrochlorothiazide (HYDRODIURIL) 25 MG tablet Take 25 mg by mouth at bedtime.     Historical Provider, MD  HYDROcodone-acetaminophen (NORCO/VICODIN) 5-325 MG per tablet Take 1 tablet by mouth every 6 (six) hours as needed for moderate pain. 11/28/13   Maudry Diego, MD  lisinopril (PRINIVIL,ZESTRIL) 10 MG tablet Take 5 mg by mouth daily.     Historical  Provider, MD  metroNIDAZOLE (FLAGYL) 500 MG tablet Take 1 tablet (500 mg total) by mouth 3 (three) times daily. One po bid x 7 days 11/28/13   Maudry Diego, MD  oxyCODONE-acetaminophen (PERCOCET/ROXICET) 5-325 MG per tablet Take 1 tablet by mouth every 6 (six) hours as needed for severe pain. 11/06/13   Maudry Diego, MD  warfarin (COUMADIN) 5 MG tablet Take 5-7.5 mg by mouth See admin instructions. Patient alternates between 5mg  and 7.5mg  Patient had 7.5mg  on(11/05/13)    Historical Provider, MD   BP 132/61  Pulse 66  Temp(Src) 98 F (36.7 C) (Oral)  Resp 24  Ht 5\' 6"  (1.676 m)  Wt 363 lb (164.656 kg)  BMI 58.62 kg/m2  SpO2 99%  LMP 10/24/2013 Physical Exam  Nursing note and vitals reviewed. Constitutional: She is oriented to person, place, and time.  Morbidly obese  HENT:  Head: Normocephalic.  Right Ear: Tympanic membrane normal.  Left Ear: Tympanic membrane normal.  Nose: Nose normal.  Mouth/Throat: Uvula is midline, oropharynx is clear and moist and mucous membranes are normal.    Right lower first molar broken  Eyes: Conjunctivae and EOM are normal.  Neck: Normal range of motion. Neck supple.  Cardiovascular: Normal rate and regular rhythm.   Pulmonary/Chest: Effort normal and breath sounds normal.  Abdominal: Soft. There is no tenderness.  Musculoskeletal: Normal range of motion.  Neurological: She is alert and oriented to person, place, and time. No cranial nerve deficit.  Skin: Skin is warm and dry.  Psychiatric: She has a normal mood and affect.    ED Course: discussed with Dr. Lacinda Axon and will give 4 Percocet   Procedures  MDM  53 y.o. female with dental pain due to broken tooth and dental decay. Will treat with antibiotics and pain medication. She is to follow up with a dentist as soon as possible. Patient given Rx for 4 percocet.   Medication List    TAKE these medications       clindamycin 150 MG capsule  Commonly known as:  CLEOCIN  Take 1 capsule (150  mg total) by mouth every 6 (six) hours.     oxyCODONE-acetaminophen 5-325 MG per tablet  Commonly known as:  ROXICET  Take 1 tablet by mouth every 4 (four) hours as needed for severe pain.      ASK your doctor about these medications       acetaminophen 500 MG tablet  Commonly known as:  TYLENOL  Take 500 mg by mouth every 6 (six) hours as needed for mild pain.     albuterol 108 (90 BASE) MCG/ACT inhaler  Commonly known as:  PROVENTIL HFA;VENTOLIN HFA  Inhale 2 puffs into the lungs every 4 (four)  hours as needed for wheezing or shortness of breath.     buPROPion 300 MG 24 hr tablet  Commonly known as:  WELLBUTRIN XL  Take 300 mg by mouth daily.     busPIRone 15 MG tablet  Commonly known as:  BUSPAR  Take 2 tablets (30 mg total) by mouth 2 (two) times daily.     escitalopram 20 MG tablet  Commonly known as:  LEXAPRO  Take 20 mg by mouth daily.     hydrochlorothiazide 25 MG tablet  Commonly known as:  HYDRODIURIL  Take 25 mg by mouth at bedtime.     lisinopril 10 MG tablet  Commonly known as:  PRINIVIL,ZESTRIL  Take 5 mg by mouth daily.     LORazepam 2 MG tablet  Commonly known as:  ATIVAN  Take 2 mg by mouth 3 (three) times daily.     warfarin 5 MG tablet  Commonly known as:  COUMADIN  Take 5-7.5 mg by mouth See admin instructions. Patient alternates between 5mg  and 7.5mg  Patient had 7.5mg  on(11/05/13)           Ashley Murrain, NP 12/16/13 [phone]

## 2013-12-14 NOTE — Discharge Instructions (Signed)
It is important that you follow up with a dentist as soon as possible. You will need to see Dr. Brunetta Genera to treat your pain until you can see a dentist to have the tooth out.   Chronic Pain Chronic pain can be defined as pain that is off and on and lasts for 3 6 months or longer. Many things cause chronic pain, which can make it difficult to make a diagnosis. There are many treatment options available for chronic pain. However, finding a treatment that works well for you may require trying various approaches until the right one is found. Many people benefit from a combination of two or more types of treatment to control their pain. SYMPTOMS  Chronic pain can occur anywhere in the body and can range from mild to very severe. Some types of chronic pain include:  Headache.  Low back pain.  Cancer pain.  Arthritis pain.  Neurogenic pain. This is pain resulting from damage to nerves. People with chronic pain may also have other symptoms such as:  Depression.  Anger.  Insomnia.  Anxiety. DIAGNOSIS  Your health care provider will help diagnose your condition over time. In many cases, the initial focus will be on excluding possible conditions that could be causing the pain. Depending on your symptoms, your health care provider may order tests to diagnose your condition. Some of these tests may include:   Blood tests.   CT scan.   MRI.   X-rays.   Ultrasounds.   Nerve conduction studies.  You may need to see a specialist.  TREATMENT  Finding treatment that works well may take time. You may be referred to a pain specialist. He or she may prescribe medicine or therapies, such as:   Mindful meditation or yoga.  Shots (injections) of numbing or pain-relieving medicines into the spine or area of pain.  Local electrical stimulation.  Acupuncture.   Massage therapy.   Aroma, color, light, or sound therapy.   Biofeedback.   Working with a physical therapist to keep  from getting stiff.   Regular, gentle exercise.   Cognitive or behavioral therapy.   Group support.  Sometimes, surgery may be recommended.  HOME CARE INSTRUCTIONS   Take all medicines as directed by your health care provider.   Lessen stress in your life by relaxing and doing things such as listening to calming music.   Exercise or be active as directed by your health care provider.   Eat a healthy diet and include things such as vegetables, fruits, fish, and lean meats in your diet.   Keep all follow-up appointments with your health care provider.   Attend a support group with others suffering from chronic pain. SEEK MEDICAL CARE IF:   Your pain gets worse.   You develop a new pain that was not there before.   You cannot tolerate medicines given to you by your health care provider.   You have new symptoms since your last visit with your health care provider.  SEEK IMMEDIATE MEDICAL CARE IF:   You feel weak.   You have decreased sensation or numbness.   You lose control of bowel or bladder function.   Your pain suddenly gets much worse.   You develop shaking.  You develop chills.  You develop confusion.  You develop chest pain.  You develop shortness of breath.  MAKE SURE YOU:  Understand these instructions.  Will watch your condition.  Will get help right away if you are not doing well  or get worse. Document Released: 04/09/2002 Document Revised: 03/20/2013 Document Reviewed: 01/11/2013 [facility] Patient Information 2014 [place].

## 2013-12-25 NOTE — ED Provider Notes (Signed)
Medical screening examination/treatment/procedure(s) were performed by non-physician practitioner and as supervising physician I was immediately available for consultation/collaboration.   EKG Interpretation None       Nat Christen, MD 12/25/13 [phone]

## 2014-01-06 ENCOUNTER — Emergency Department: Payer: Self-pay | Admitting: Emergency Medicine

## 2014-01-06 LAB — COMPREHENSIVE METABOLIC PANEL
ALBUMIN: 3.7 g/dL (ref 3.4–5.0)
ALK PHOS: 95 U/L
ALT: 32 U/L (ref 12–78)
AST: 27 U/L (ref 15–37)
Anion Gap: 8 (ref 7–16)
BUN: 19 mg/dL — ABNORMAL HIGH (ref 7–18)
Bilirubin,Total: 0.5 mg/dL (ref 0.2–1.0)
CO2: 24 mmol/L (ref 21–32)
CREATININE: 1.21 mg/dL (ref 0.60–1.30)
Calcium, Total: 9.8 mg/dL (ref 8.5–10.1)
Chloride: 107 mmol/L (ref 98–107)
EGFR (African American): >60
EGFR (Non-African Amer.): 52 — ABNORMAL LOW
GLUCOSE: 101 mg/dL — AB (ref 65–99)
OSMOLALITY: 280 (ref 275–301)
Potassium: 3.9 mmol/L (ref 3.5–5.1)
SODIUM: 139 mmol/L (ref 136–145)
Total Protein: 7.6 g/dL (ref 6.4–8.2)

## 2014-01-06 LAB — CBC WITH DIFFERENTIAL/PLATELET
Basophil #: 0.1 10*3/uL (ref 0.0–0.1)
Basophil %: 1 %
EOS ABS: 0.1 10*3/uL (ref 0.0–0.7)
Eosinophil %: 1.1 %
HCT: 35.6 % (ref 35.0–47.0)
HGB: 11.7 g/dL — AB (ref 12.0–16.0)
LYMPHS ABS: 1.8 10*3/uL (ref 1.0–3.6)
Lymphocyte %: 26.1 %
MCH: 28.1 pg (ref 26.0–34.0)
MCHC: 32.8 g/dL (ref 32.0–36.0)
MCV: 86 fL (ref 80–100)
Monocyte #: 0.3 x10 3/mm (ref 0.2–0.9)
Monocyte %: 4.4 %
NEUTROS PCT: 67.4 %
Neutrophil #: 4.6 10*3/uL (ref 1.4–6.5)
PLATELETS: 276 10*3/uL (ref 150–440)
RBC: 4.16 10*6/uL (ref 3.80–5.20)
RDW: 13 % (ref 11.5–14.5)
WBC: 6.8 10*3/uL (ref 3.6–11.0)

## 2014-01-06 LAB — PROTIME-INR
INR: 1.8
Prothrombin Time: 20.9 secs — ABNORMAL HIGH (ref 11.5–14.7)

## 2014-01-17 ENCOUNTER — Emergency Department: Payer: Self-pay | Admitting: Emergency Medicine

## 2014-01-17 LAB — COMPREHENSIVE METABOLIC PANEL
ALK PHOS: 101 U/L
ANION GAP: 8 (ref 7–16)
Albumin: 4 g/dL (ref 3.4–5.0)
BILIRUBIN TOTAL: 0.6 mg/dL (ref 0.2–1.0)
BUN: 19 mg/dL — AB (ref 7–18)
Calcium, Total: 9.9 mg/dL (ref 8.5–10.1)
Chloride: 103 mmol/L (ref 98–107)
Co2: 25 mmol/L (ref 21–32)
Creatinine: 1.11 mg/dL (ref 0.60–1.30)
EGFR (African American): >60
EGFR (Non-African Amer.): 58 — ABNORMAL LOW
GLUCOSE: 101 mg/dL — AB (ref 65–99)
Osmolality: 274 (ref 275–301)
Potassium: 3.6 mmol/L (ref 3.5–5.1)
SGOT(AST): 30 U/L (ref 15–37)
SGPT (ALT): 41 U/L (ref 12–78)
Sodium: 136 mmol/L (ref 136–145)
TOTAL PROTEIN: 7.9 g/dL (ref 6.4–8.2)

## 2014-01-17 LAB — URINALYSIS, COMPLETE
BILIRUBIN, UR: NEGATIVE
Glucose,UR: NEGATIVE mg/dL (ref 0–75)
Hyaline Cast: 4
KETONE: NEGATIVE
NITRITE: NEGATIVE
Ph: 6 (ref 4.5–8.0)
Protein: NEGATIVE
RBC,UR: 9 /HPF (ref 0–5)
Specific Gravity: 1.015 (ref 1.003–1.030)
WBC UR: 118 /HPF (ref 0–5)

## 2014-01-17 LAB — CBC WITH DIFFERENTIAL/PLATELET
BASOS ABS: 0.1 10*3/uL (ref 0.0–0.1)
Basophil %: 1 %
EOS PCT: 1.5 %
Eosinophil #: 0.1 10*3/uL (ref 0.0–0.7)
HCT: 37.3 % (ref 35.0–47.0)
HGB: 12.3 g/dL (ref 12.0–16.0)
LYMPHS ABS: 2.4 10*3/uL (ref 1.0–3.6)
LYMPHS PCT: 35.9 %
MCH: 28.6 pg (ref 26.0–34.0)
MCHC: 32.9 g/dL (ref 32.0–36.0)
MCV: 87 fL (ref 80–100)
MONO ABS: 0.4 x10 3/mm (ref 0.2–0.9)
Monocyte %: 6.7 %
NEUTROS PCT: 54.9 %
Neutrophil #: 3.7 10*3/uL (ref 1.4–6.5)
Platelet: 278 10*3/uL (ref 150–440)
RBC: 4.3 10*6/uL (ref 3.80–5.20)
RDW: 12.9 % (ref 11.5–14.5)
WBC: 6.7 10*3/uL (ref 3.6–11.0)

## 2014-01-17 LAB — PROTIME-INR
INR: 2
Prothrombin Time: 22 secs — ABNORMAL HIGH (ref 11.5–14.7)

## 2014-01-19 ENCOUNTER — Encounter (HOSPITAL_COMMUNITY): Payer: Self-pay | Admitting: Emergency Medicine

## 2014-01-19 ENCOUNTER — Emergency Department (HOSPITAL_COMMUNITY)
Admission: EM | Admit: 2014-01-19 | Discharge: 2014-01-19 | Disposition: A | Discharge status: Home / Self Care | Payer: Medicaid Other | Attending: Emergency Medicine | Admitting: Emergency Medicine

## 2014-01-19 DIAGNOSIS — M79604 Pain in right leg: Secondary | ICD-10-CM

## 2014-01-19 DIAGNOSIS — M129 Arthropathy, unspecified: Secondary | ICD-10-CM | POA: Insufficient documentation

## 2014-01-19 DIAGNOSIS — Z7901 Long term (current) use of anticoagulants: Secondary | ICD-10-CM | POA: Insufficient documentation

## 2014-01-19 DIAGNOSIS — J45909 Unspecified asthma, uncomplicated: Secondary | ICD-10-CM | POA: Insufficient documentation

## 2014-01-19 DIAGNOSIS — Z8619 Personal history of other infectious and parasitic diseases: Secondary | ICD-10-CM | POA: Insufficient documentation

## 2014-01-19 DIAGNOSIS — Z8719 Personal history of other diseases of the digestive system: Secondary | ICD-10-CM | POA: Insufficient documentation

## 2014-01-19 DIAGNOSIS — F3289 Other specified depressive episodes: Secondary | ICD-10-CM | POA: Insufficient documentation

## 2014-01-19 DIAGNOSIS — F329 Major depressive disorder, single episode, unspecified: Secondary | ICD-10-CM | POA: Insufficient documentation

## 2014-01-19 DIAGNOSIS — M545 Low back pain, unspecified: Secondary | ICD-10-CM | POA: Insufficient documentation

## 2014-01-19 DIAGNOSIS — Z86718 Personal history of other venous thrombosis and embolism: Secondary | ICD-10-CM | POA: Insufficient documentation

## 2014-01-19 DIAGNOSIS — E119 Type 2 diabetes mellitus without complications: Secondary | ICD-10-CM | POA: Insufficient documentation

## 2014-01-19 DIAGNOSIS — G4733 Obstructive sleep apnea (adult) (pediatric): Secondary | ICD-10-CM | POA: Insufficient documentation

## 2014-01-19 DIAGNOSIS — N644 Mastodynia: Secondary | ICD-10-CM | POA: Insufficient documentation

## 2014-01-19 DIAGNOSIS — Z79899 Other long term (current) drug therapy: Secondary | ICD-10-CM | POA: Insufficient documentation

## 2014-01-19 DIAGNOSIS — M79609 Pain in unspecified limb: Secondary | ICD-10-CM | POA: Insufficient documentation

## 2014-01-19 DIAGNOSIS — M79601 Pain in right arm: Secondary | ICD-10-CM

## 2014-01-19 DIAGNOSIS — G8929 Other chronic pain: Secondary | ICD-10-CM | POA: Insufficient documentation

## 2014-01-19 DIAGNOSIS — N6459 Other signs and symptoms in breast: Secondary | ICD-10-CM | POA: Insufficient documentation

## 2014-01-19 DIAGNOSIS — I10 Essential (primary) hypertension: Secondary | ICD-10-CM | POA: Insufficient documentation

## 2014-01-19 DIAGNOSIS — Z872 Personal history of diseases of the skin and subcutaneous tissue: Secondary | ICD-10-CM | POA: Insufficient documentation

## 2014-01-19 DIAGNOSIS — N6452 Nipple discharge: Secondary | ICD-10-CM

## 2014-01-19 LAB — PROTIME-INR
INR: 2.76 — ABNORMAL HIGH (ref 0.00–1.49)
PROTHROMBIN TIME: 28.2 s — AB (ref 11.6–15.2)

## 2014-01-19 MED ORDER — KETOROLAC TROMETHAMINE 60 MG/2ML IM SOLN
60.0000 mg | Freq: Once | INTRAMUSCULAR | Status: DC
  Filled 2014-01-19: qty 2

## 2014-01-19 MED ORDER — OXYCODONE-ACETAMINOPHEN 5-325 MG PO TABS
2.0000 | ORAL_TABLET | Freq: Once | ORAL | Status: AC
  Administered 2014-01-19: 2 via ORAL
  Filled 2014-01-19: qty 2

## 2014-01-19 NOTE — ED Notes (Signed)
Pt presents with c/o left breast pain and drainage from that breast. Pt says the drainage is black in color. Pt also says that her right leg is painful, hx of blood clot in that leg. Pt denies any chest pain but says that she feels a "warmth" in her chest from mid sternal and up to her throat. Pt says that at times she feels heaviness in her chest as well but not pain at this time in her chest.

## 2014-01-19 NOTE — ED Provider Notes (Signed)
CSN: [id number]     Arrival date & time 01/19/14  1521 History   First MD Initiated Contact with Patient 01/19/14 1535     Chief Complaint  Patient presents with  . Breast Pain  . Leg Pain     (Consider location/radiation/quality/duration/timing/severity/associated sxs/prior Treatment) Patient is a 53 y.o. female presenting with leg pain. The history is provided by the patient.  Leg Pain Location:  Leg Injury: no   Leg location:  R upper leg Pain details:    Quality:  Aching and sharp   Radiates to:  Back   Severity:  Moderate   Onset quality:  Gradual   Timing:  Intermittent   Progression:  Worsening Chronicity:  Chronic Dislocation: no   Prior injury to area:  No Relieved by:  Nothing Worsened by:  Nothing tried Associated symptoms: back pain   Associated symptoms: no fever, no neck pain, no numbness, no stiffness and no swelling     Past Medical History  Diagnosis Date  . Hypertension   . Depression   . Type 2 diabetes mellitus   . Diverticulitis   . Asthma   . Cyst     pt has "cyst" to legs for years, areas drain at time  . Pancreatitis   . Arthritis   . C. difficile colitis 11/08/2011  . Musculoskeletal pain 11/08/2011  . Neuropathic pain 11/10/2011  . Sleep apnea with use of continuous positive airway pressure (CPAP)     uses CPAP at night  . 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 DVT (deep venous thrombosis)    Past Surgical History  Procedure Laterality Date  . Tonsillectomy    . Cholecystectomy    . Cesarean section    . Nose surgery     Family History  Problem Relation Age of Onset  . Stroke Other   . Cancer Other   . Diabetes Other    History  Substance Use Topics  . Smoking status: Never Smoker   . Smokeless tobacco: Never Used  . Alcohol Use: No   OB History   Grav Para Term Preterm Abortions TAB SAB Ect Mult Living   3 2 2  1  1   2      Review of Systems  Constitutional: Negative for fever.   Musculoskeletal: Positive for back pain. Negative for neck pain and stiffness.  All other systems reviewed and are negative.     Allergies  Hydrocodone; Nitroglycerin; Tramadol; Dilaudid; Levaquin; Trazodone and nefazodone; and Sulfa antibiotics  Home Medications   Prior to Admission medications   Medication Sig Start Date End Date Taking? Authorizing Rafaella Kole  acetaminophen (TYLENOL) 500 MG tablet Take 500 mg by mouth every 6 (six) hours as needed for mild pain.   Yes Historical Shaden Higley, MD  albuterol (PROVENTIL HFA;VENTOLIN HFA) 108 (90 BASE) MCG/ACT inhaler Inhale 2 puffs into the lungs every 4 (four) hours as needed for wheezing or shortness of breath. 07/19/13  Yes Michiel Cowboy, MD  buPROPion (WELLBUTRIN XL) 300 MG 24 hr tablet Take 300 mg by mouth daily.    Yes Historical Adelie Croswell, MD  busPIRone (BUSPAR) 15 MG tablet Take 2 tablets (30 mg total) by mouth 2 (two) times daily. 08/15/13  Yes Rebecca Eaton, MD  escitalopram (LEXAPRO) 20 MG tablet Take 20 mg by mouth daily.     Yes Historical Geofrey Silliman, MD  hydrochlorothiazide (HYDRODIURIL) 25 MG tablet Take 25 mg by mouth at bedtime.    Yes Historical Idali Lafever,  MD  lisinopril (PRINIVIL,ZESTRIL) 10 MG tablet Take 5 mg by mouth daily.    Yes Historical Ameliah Baskins, MD  LORazepam (ATIVAN) 2 MG tablet Take 2 mg by mouth 3 (three) times daily.   Yes Historical Zaeem Kandel, MD  warfarin (COUMADIN) 5 MG tablet Take 7.5-10 mg by mouth daily at 6 PM. Patient take 7.5 mg for two days then takes 10 mg for one day and repeats , until next MD appointment on 01/24/14.   Yes Historical Capria Cartaya, MD   BP 115/51  Pulse 77  Temp(Src) 98.2 F (36.8 C) (Oral)  Resp 18  SpO2 100% Physical Exam  Nursing note and vitals reviewed. Constitutional: She is oriented to person, place, and time. She appears well-developed and well-nourished. No distress.  HENT:  Head: Normocephalic and atraumatic.  Eyes: EOM are normal. Pupils are equal, round, and reactive  to light.  Neck: Normal range of motion. Neck supple.  Cardiovascular: Normal rate and regular rhythm.  Exam reveals no friction rub.   No murmur heard. Pulmonary/Chest: Effort normal and breath sounds normal. No respiratory distress. She has no wheezes. She has no rales.  Abdominal: Soft. She exhibits no distension. There is no tenderness. There is no rebound.  Musculoskeletal: Normal range of motion. She exhibits no edema.  Neurological: She is alert and oriented to person, place, and time.  Skin: She is not diaphoretic.    ED Course  Procedures (including critical care time) Labs Review Labs Reviewed  PROTIME-INR    Imaging Review No results found.   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 She has multiple complaints on the room including right axillary pain, right upper quadrant pain. Right leg pain: Patient is on Coumadin for DVT since December. Last ultrasound performed in April was negative for DVT. She also states she's had sciatica. Her right leg pain is from her right hip to her knee. She has sciatica that starts in her lower back radiates down into her leg to her knee. She's had pain anteriorly between the hip and knee from her DVT before. She has not missed any Coumadin. She's not having a shorter breath. She has good pulses peripherally with good cap refill. We'll check an INR, therapeutic will not worry with repeating an  ultrasound in today. Review of narcotic database shows multiple prescriptions from numerous providers, most recently 13 days ago. Will hold off on narcotic prescriptions today. 2 percocet given here for pain relief.. Left breast discharge: Happened one month ago, described as black. Happened again yesterday and today. On my exam, no breast masses. No discharge from the nipple. She has a healing lesion that is scabbed over at 10:00 of the areola. She stated she had these lesions before. I informed her she needs to get a mammogram to rule out possible breast cancer with for the nipple discharge. Her sister reports she has one scheduled for the next 2  days. R axillary pain: No adenopathy identified. No decreased ROM of R shoulder. No abscess or cellulitis. RUQ pain: Abdominal exam benign, no nausea, no vomiting.  Patient is tearful on exam. She has multiple chronic medical problems that she needs to follow up with her PCP for. No narcotics given today to go home, but 2 percocet given her for pain relief. INR therapeutic, no need for repeat US today. Stable for discharge.  Osvaldo Shipper, MD 01/19/14 [phone]

## 2014-01-19 NOTE — Discharge Instructions (Signed)

## 2014-01-19 NOTE — ED Notes (Addendum)
Pt reports black drainage from left nipple. She also c/o stabbing pain in left pain. She also c/o right leg pain in which she was dx with a DVT. She also has a lipoma to the left thigh. She is taking coumadin since DEC. She takes Tylenol for pain w/o relief.

## 2014-01-19 NOTE — ED Notes (Signed)
MD at bedside. 

## 2014-01-20 LAB — HM MAMMOGRAPHY

## 2014-03-04 DIAGNOSIS — Z6841 Body Mass Index (BMI) 40.0 and over, adult: Secondary | ICD-10-CM

## 2014-03-04 DIAGNOSIS — Z8711 Personal history of peptic ulcer disease: Secondary | ICD-10-CM | POA: Insufficient documentation

## 2014-03-04 DIAGNOSIS — Z8719 Personal history of other diseases of the digestive system: Secondary | ICD-10-CM | POA: Insufficient documentation

## 2014-03-04 DIAGNOSIS — M1611 Unilateral primary osteoarthritis, right hip: Secondary | ICD-10-CM | POA: Insufficient documentation

## 2014-03-19 ENCOUNTER — Emergency Department: Payer: Self-pay | Admitting: Emergency Medicine

## 2014-03-19 LAB — URINALYSIS, COMPLETE
BILIRUBIN, UR: NEGATIVE
Bacteria: NONE SEEN
GLUCOSE, UR: NEGATIVE mg/dL (ref 0–75)
KETONE: NEGATIVE
Leukocyte Esterase: NEGATIVE
Nitrite: NEGATIVE
Ph: 6 (ref 4.5–8.0)
Protein: NEGATIVE
RBC,UR: 2 /HPF (ref 0–5)
SPECIFIC GRAVITY: 1.018 (ref 1.003–1.030)

## 2014-03-19 LAB — CBC
HCT: 36.4 % (ref 35.0–47.0)
HGB: 11.7 g/dL — ABNORMAL LOW (ref 12.0–16.0)
MCH: 27.9 pg (ref 26.0–34.0)
MCHC: 32.1 g/dL (ref 32.0–36.0)
MCV: 87 fL (ref 80–100)
Platelet: 269 10*3/uL (ref 150–440)
RBC: 4.2 10*6/uL (ref 3.80–5.20)
RDW: 12.9 % (ref 11.5–14.5)
WBC: 9.6 10*3/uL (ref 3.6–11.0)

## 2014-03-19 LAB — BASIC METABOLIC PANEL
ANION GAP: 10 (ref 7–16)
BUN: 16 mg/dL (ref 7–18)
CREATININE: 0.85 mg/dL (ref 0.60–1.30)
Calcium, Total: 9.2 mg/dL (ref 8.5–10.1)
Chloride: 103 mmol/L (ref 98–107)
Co2: 26 mmol/L (ref 21–32)
EGFR (African American): >60
EGFR (Non-African Amer.): >60
Glucose: 84 mg/dL (ref 65–99)
OSMOLALITY: 278 (ref 275–301)
Potassium: 3.7 mmol/L (ref 3.5–5.1)
SODIUM: 139 mmol/L (ref 136–145)

## 2014-03-19 LAB — TROPONIN I: Troponin-I: <0.02 ng/mL

## 2014-03-19 LAB — PROTIME-INR
INR: 2
Prothrombin Time: 22.2 secs — ABNORMAL HIGH (ref 11.5–14.7)

## 2014-04-14 ENCOUNTER — Emergency Department: Payer: Self-pay | Admitting: Emergency Medicine

## 2014-04-14 LAB — BASIC METABOLIC PANEL
Anion Gap: 5 — ABNORMAL LOW (ref 7–16)
BUN: 23 mg/dL — ABNORMAL HIGH (ref 7–18)
CALCIUM: 9.4 mg/dL (ref 8.5–10.1)
Chloride: 106 mmol/L (ref 98–107)
Co2: 29 mmol/L (ref 21–32)
Creatinine: 1.02 mg/dL (ref 0.60–1.30)
EGFR (Non-African Amer.): >60
Glucose: 90 mg/dL (ref 65–99)
Osmolality: 283 (ref 275–301)
Potassium: 4.1 mmol/L (ref 3.5–5.1)
Sodium: 140 mmol/L (ref 136–145)

## 2014-04-14 LAB — CBC WITH DIFFERENTIAL/PLATELET
Basophil #: 0.1 10*3/uL (ref 0.0–0.1)
Basophil %: 0.7 %
Eosinophil #: 0.2 10*3/uL (ref 0.0–0.7)
Eosinophil %: 1.9 %
HCT: 35.7 % (ref 35.0–47.0)
HGB: 11.4 g/dL — AB (ref 12.0–16.0)
Lymphocyte #: 3 10*3/uL (ref 1.0–3.6)
Lymphocyte %: 34.1 %
MCH: 27.9 pg (ref 26.0–34.0)
MCHC: 31.9 g/dL — ABNORMAL LOW (ref 32.0–36.0)
MCV: 88 fL (ref 80–100)
MONO ABS: 0.6 x10 3/mm (ref 0.2–0.9)
Monocyte %: 6.6 %
NEUTROS ABS: 5 10*3/uL (ref 1.4–6.5)
Neutrophil %: 56.7 %
Platelet: 270 10*3/uL (ref 150–440)
RBC: 4.07 10*6/uL (ref 3.80–5.20)
RDW: 12.9 % (ref 11.5–14.5)
WBC: 8.8 10*3/uL (ref 3.6–11.0)

## 2014-04-14 LAB — PROTIME-INR
INR: 1.3
Prothrombin Time: 15.8 secs — ABNORMAL HIGH (ref 11.5–14.7)

## 2014-04-14 LAB — APTT: Activated PTT: 35.6 secs (ref 23.6–35.9)

## 2014-04-14 LAB — MAGNESIUM: Magnesium: 2.1 mg/dL

## 2014-05-23 ENCOUNTER — Emergency Department: Payer: Self-pay | Admitting: Emergency Medicine

## 2014-05-23 LAB — COMPREHENSIVE METABOLIC PANEL
ALK PHOS: 95 U/L
AST: 19 U/L (ref 15–37)
Albumin: 3.5 g/dL (ref 3.4–5.0)
Anion Gap: 6 — ABNORMAL LOW (ref 7–16)
BUN: 17 mg/dL (ref 7–18)
Bilirubin,Total: 0.4 mg/dL (ref 0.2–1.0)
CALCIUM: 9.4 mg/dL (ref 8.5–10.1)
CHLORIDE: 106 mmol/L (ref 98–107)
CO2: 26 mmol/L (ref 21–32)
Creatinine: 0.9 mg/dL (ref 0.60–1.30)
EGFR (African American): >60
Glucose: 98 mg/dL (ref 65–99)
OSMOLALITY: 277 (ref 275–301)
POTASSIUM: 4 mmol/L (ref 3.5–5.1)
SGPT (ALT): 21 U/L
Sodium: 138 mmol/L (ref 136–145)
Total Protein: 7.3 g/dL (ref 6.4–8.2)

## 2014-05-23 LAB — CK TOTAL AND CKMB (NOT AT ARMC): CK, TOTAL: 38 U/L

## 2014-05-23 LAB — TROPONIN I

## 2014-05-23 LAB — D-DIMER(ARMC): D-DIMER: 961 ng/mL

## 2014-05-23 LAB — CBC
HCT: 31.3 % — ABNORMAL LOW (ref 35.0–47.0)
HGB: 10.1 g/dL — AB (ref 12.0–16.0)
MCH: 28.1 pg (ref 26.0–34.0)
MCHC: 32.3 g/dL (ref 32.0–36.0)
MCV: 87 fL (ref 80–100)
Platelet: 368 10*3/uL (ref 150–440)
RBC: 3.6 10*6/uL — ABNORMAL LOW (ref 3.80–5.20)
RDW: 13.5 % (ref 11.5–14.5)
WBC: 7 10*3/uL (ref 3.6–11.0)

## 2014-06-02 ENCOUNTER — Encounter (HOSPITAL_COMMUNITY): Payer: Self-pay | Admitting: Emergency Medicine

## 2014-06-03 ENCOUNTER — Emergency Department: Payer: Self-pay | Admitting: Emergency Medicine

## 2014-06-03 LAB — BASIC METABOLIC PANEL
Anion Gap: 10 (ref 7–16)
BUN: 14 mg/dL (ref 7–18)
CALCIUM: 8.7 mg/dL (ref 8.5–10.1)
CHLORIDE: 106 mmol/L (ref 98–107)
Co2: 26 mmol/L (ref 21–32)
Creatinine: 0.96 mg/dL (ref 0.60–1.30)
Glucose: 74 mg/dL (ref 65–99)
Osmolality: 282 (ref 275–301)
Potassium: 3.6 mmol/L (ref 3.5–5.1)
Sodium: 142 mmol/L (ref 136–145)

## 2014-06-03 LAB — CBC
HCT: 31.6 % — ABNORMAL LOW (ref 35.0–47.0)
HGB: 10.4 g/dL — AB (ref 12.0–16.0)
MCH: 28.5 pg (ref 26.0–34.0)
MCHC: 33 g/dL (ref 32.0–36.0)
MCV: 87 fL (ref 80–100)
Platelet: 231 10*3/uL (ref 150–440)
RBC: 3.66 10*6/uL — AB (ref 3.80–5.20)
RDW: 13.6 % (ref 11.5–14.5)
WBC: 9 10*3/uL (ref 3.6–11.0)

## 2014-06-03 LAB — TROPONIN I

## 2014-06-03 LAB — PROTIME-INR
INR: 1.1
PROTHROMBIN TIME: 14.3 s (ref 11.5–14.7)

## 2014-06-20 DIAGNOSIS — I119 Hypertensive heart disease without heart failure: Secondary | ICD-10-CM | POA: Insufficient documentation

## 2014-06-20 DIAGNOSIS — I471 Supraventricular tachycardia: Secondary | ICD-10-CM | POA: Insufficient documentation

## 2014-06-20 DIAGNOSIS — I340 Nonrheumatic mitral (valve) insufficiency: Secondary | ICD-10-CM | POA: Insufficient documentation

## 2014-07-13 ENCOUNTER — Emergency Department: Payer: Self-pay | Admitting: Internal Medicine

## 2014-07-24 ENCOUNTER — Emergency Department (HOSPITAL_COMMUNITY)
Admission: EM | Admit: 2014-07-24 | Discharge: 2014-07-24 | Disposition: A | Discharge status: Home / Self Care | Payer: Medicaid Other | Attending: Emergency Medicine | Admitting: Emergency Medicine

## 2014-07-24 ENCOUNTER — Emergency Department (HOSPITAL_COMMUNITY): Payer: Medicaid Other

## 2014-07-24 ENCOUNTER — Encounter (HOSPITAL_COMMUNITY): Payer: Self-pay | Admitting: Physical Medicine and Rehabilitation

## 2014-07-24 DIAGNOSIS — E119 Type 2 diabetes mellitus without complications: Secondary | ICD-10-CM | POA: Insufficient documentation

## 2014-07-24 DIAGNOSIS — S40012A Contusion of left shoulder, initial encounter: Secondary | ICD-10-CM

## 2014-07-24 DIAGNOSIS — Z7901 Long term (current) use of anticoagulants: Secondary | ICD-10-CM | POA: Diagnosis not present

## 2014-07-24 DIAGNOSIS — W1789XA Other fall from one level to another, initial encounter: Secondary | ICD-10-CM | POA: Diagnosis not present

## 2014-07-24 DIAGNOSIS — S79912A Unspecified injury of left hip, initial encounter: Secondary | ICD-10-CM | POA: Diagnosis present

## 2014-07-24 DIAGNOSIS — Z86718 Personal history of other venous thrombosis and embolism: Secondary | ICD-10-CM | POA: Diagnosis not present

## 2014-07-24 DIAGNOSIS — Y9289 Other specified places as the place of occurrence of the external cause: Secondary | ICD-10-CM | POA: Insufficient documentation

## 2014-07-24 DIAGNOSIS — Y9301 Activity, walking, marching and hiking: Secondary | ICD-10-CM | POA: Diagnosis not present

## 2014-07-24 DIAGNOSIS — S199XXA Unspecified injury of neck, initial encounter: Secondary | ICD-10-CM | POA: Diagnosis not present

## 2014-07-24 DIAGNOSIS — Z8719 Personal history of other diseases of the digestive system: Secondary | ICD-10-CM | POA: Insufficient documentation

## 2014-07-24 DIAGNOSIS — W19XXXA Unspecified fall, initial encounter: Secondary | ICD-10-CM

## 2014-07-24 DIAGNOSIS — Z3202 Encounter for pregnancy test, result negative: Secondary | ICD-10-CM | POA: Diagnosis not present

## 2014-07-24 DIAGNOSIS — S299XXA Unspecified injury of thorax, initial encounter: Secondary | ICD-10-CM | POA: Insufficient documentation

## 2014-07-24 DIAGNOSIS — G4733 Obstructive sleep apnea (adult) (pediatric): Secondary | ICD-10-CM | POA: Insufficient documentation

## 2014-07-24 DIAGNOSIS — Z79899 Other long term (current) drug therapy: Secondary | ICD-10-CM | POA: Insufficient documentation

## 2014-07-24 DIAGNOSIS — M25552 Pain in left hip: Secondary | ICD-10-CM

## 2014-07-24 DIAGNOSIS — M1990 Unspecified osteoarthritis, unspecified site: Secondary | ICD-10-CM | POA: Diagnosis not present

## 2014-07-24 DIAGNOSIS — R1030 Lower abdominal pain, unspecified: Secondary | ICD-10-CM

## 2014-07-24 DIAGNOSIS — E669 Obesity, unspecified: Secondary | ICD-10-CM | POA: Diagnosis not present

## 2014-07-24 DIAGNOSIS — S3991XA Unspecified injury of abdomen, initial encounter: Secondary | ICD-10-CM | POA: Diagnosis not present

## 2014-07-24 DIAGNOSIS — J45909 Unspecified asthma, uncomplicated: Secondary | ICD-10-CM | POA: Insufficient documentation

## 2014-07-24 DIAGNOSIS — Z8619 Personal history of other infectious and parasitic diseases: Secondary | ICD-10-CM | POA: Diagnosis not present

## 2014-07-24 DIAGNOSIS — Y998 Other external cause status: Secondary | ICD-10-CM | POA: Insufficient documentation

## 2014-07-24 DIAGNOSIS — I10 Essential (primary) hypertension: Secondary | ICD-10-CM | POA: Insufficient documentation

## 2014-07-24 LAB — CBC WITH DIFFERENTIAL/PLATELET
Basophils Absolute: 0 10*3/uL (ref 0.0–0.1)
Basophils Relative: 0 % (ref 0–1)
EOS ABS: 0.1 10*3/uL (ref 0.0–0.7)
Eosinophils Relative: 1 % (ref 0–5)
HCT: 36.2 % (ref 36.0–46.0)
HEMOGLOBIN: 11.8 g/dL — AB (ref 12.0–15.0)
LYMPHS ABS: 1.9 10*3/uL (ref 0.7–4.0)
LYMPHS PCT: 25 % (ref 12–46)
MCH: 26.9 pg (ref 26.0–34.0)
MCHC: 32.6 g/dL (ref 30.0–36.0)
MCV: 82.5 fL (ref 78.0–100.0)
MONOS PCT: 7 % (ref 3–12)
Monocytes Absolute: 0.5 10*3/uL (ref 0.1–1.0)
NEUTROS PCT: 67 % (ref 43–77)
Neutro Abs: 5 10*3/uL (ref 1.7–7.7)
Platelets: 285 10*3/uL (ref 150–400)
RBC: 4.39 MIL/uL (ref 3.87–5.11)
RDW: 13.2 % (ref 11.5–15.5)
WBC: 7.4 10*3/uL (ref 4.0–10.5)

## 2014-07-24 LAB — BASIC METABOLIC PANEL
ANION GAP: 9 (ref 5–15)
BUN: 19 mg/dL (ref 6–23)
CHLORIDE: 104 meq/L (ref 96–112)
CO2: 23 mmol/L (ref 19–32)
Calcium: 9.9 mg/dL (ref 8.4–10.5)
Creatinine, Ser: 0.95 mg/dL (ref 0.50–1.10)
GFR calc non Af Amer: 68 mL/min — ABNORMAL LOW (ref >90)
GFR, EST AFRICAN AMERICAN: 79 mL/min — AB (ref >90)
GLUCOSE: 96 mg/dL (ref 70–99)
POTASSIUM: 4 mmol/L (ref 3.5–5.1)
Sodium: 136 mmol/L (ref 135–145)

## 2014-07-24 LAB — URINALYSIS, ROUTINE W REFLEX MICROSCOPIC
Bilirubin Urine: NEGATIVE
Glucose, UA: NEGATIVE mg/dL
HGB URINE DIPSTICK: NEGATIVE
KETONES UR: NEGATIVE mg/dL
Nitrite: NEGATIVE
PROTEIN: NEGATIVE mg/dL
Specific Gravity, Urine: 1.019 (ref 1.005–1.030)
Urobilinogen, UA: 0.2 mg/dL (ref 0.0–1.0)
pH: 8 (ref 5.0–8.0)

## 2014-07-24 LAB — URINE MICROSCOPIC-ADD ON

## 2014-07-24 LAB — PREGNANCY, URINE: Preg Test, Ur: NEGATIVE

## 2014-07-24 LAB — PROTIME-INR
INR: 1.77 — ABNORMAL HIGH (ref 0.00–1.49)
Prothrombin Time: 20.8 seconds — ABNORMAL HIGH (ref 11.6–15.2)

## 2014-07-24 MED ORDER — PROMETHAZINE HCL 25 MG/ML IJ SOLN
25.0000 mg | Freq: Once | INTRAMUSCULAR | Status: AC
  Administered 2014-07-24: 25 mg via INTRAVENOUS
  Filled 2014-07-24: qty 1

## 2014-07-24 MED ORDER — CYCLOBENZAPRINE HCL 10 MG PO TABS: 10.0000 mg | ORAL_TABLET | Freq: Two times a day (BID) | ORAL | Status: DC | PRN

## 2014-07-24 MED ORDER — IOHEXOL 300 MG/ML  SOLN
100.0000 mL | Freq: Once | INTRAMUSCULAR | Status: AC | PRN
  Administered 2014-07-24: 100 mL via INTRAVENOUS

## 2014-07-24 MED ORDER — OXYCODONE-ACETAMINOPHEN 5-325 MG PO TABS
2.0000 | ORAL_TABLET | Freq: Once | ORAL | Status: AC
  Administered 2014-07-24: 2 via ORAL
  Filled 2014-07-24: qty 2

## 2014-07-24 MED ORDER — OXYCODONE-ACETAMINOPHEN 5-325 MG PO TABS: 2.0000 | ORAL_TABLET | Freq: Four times a day (QID) | ORAL | Status: DC | PRN

## 2014-07-24 MED ORDER — FENTANYL CITRATE 0.05 MG/ML IJ SOLN
100.0000 ug | Freq: Once | INTRAMUSCULAR | Status: AC
  Administered 2014-07-24: 100 ug via INTRAVENOUS
  Filled 2014-07-24: qty 2

## 2014-07-24 MED ORDER — LORAZEPAM 2 MG/ML IJ SOLN
1.0000 mg | Freq: Once | INTRAMUSCULAR | Status: AC
  Administered 2014-07-24: 1 mg via INTRAVENOUS
  Filled 2014-07-24: qty 1
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
## 2014-07-24 NOTE — ED Provider Notes (Addendum)
Pt had a mechanical fall today.  Still having abdominal pain.  Plan is to check abdominal pelvic CT.  If negative, OK for outpatient follow up.  Dorie Rank, MD 07/24/14 1646  Pt requested a flexeril prescription.  Dorie Rank, MD 07/24/14 [phone]

## 2014-07-24 NOTE — ED Notes (Signed)
Patient removed from backboard per protocol, patient with no palpable step-offs or deformities, patient with +PMS in all extremities post removal, patient c/o right hip pain (chronic) and acute left shoulder pain from fall this am

## 2014-07-24 NOTE — Discharge Instructions (Signed)
°Contusion °A contusion is a deep bruise. Contusions are the result of an injury that caused bleeding under the skin. The contusion may turn blue, purple, or yellow. Minor injuries will give you a painless contusion, but more severe contusions may stay painful and swollen for a few weeks.  °CAUSES  °A contusion is usually caused by a blow, trauma, or direct force to an area of the body. °SYMPTOMS  °· Swelling and redness of the injured area. °· Bruising of the injured area. °· Tenderness and soreness of the injured area. °· Pain. °DIAGNOSIS  °The diagnosis can be made by taking a history and physical exam. An X-ray, CT scan, or MRI may be needed to determine if there were any associated injuries, such as fractures. °TREATMENT  °Specific treatment will depend on what area of the body was injured. In general, the best treatment for a contusion is resting, icing, elevating, and applying cold compresses to the injured area. Over-the-counter medicines may also be recommended for pain control. Ask your caregiver what the best treatment is for your contusion. °HOME CARE INSTRUCTIONS  °· Put ice on the injured area. °· Put ice in a plastic bag. °· Place a towel between your skin and the bag. °· Leave the ice on for 15-20 minutes, 3-4 times a day, or as directed by your health care provider. °· Only take over-the-counter or prescription medicines for pain, discomfort, or fever as directed by your caregiver. Your caregiver may recommend avoiding anti-inflammatory medicines (aspirin, ibuprofen, and naproxen) for 48 hours because these medicines may increase bruising. °· Rest the injured area. °· If possible, elevate the injured area to reduce swelling. °SEEK IMMEDIATE MEDICAL CARE IF:  °· You have increased bruising or swelling. °· You have pain that is getting worse. °· Your swelling or pain is not relieved with medicines. °MAKE SURE YOU:  °· Understand these instructions. °· Will watch your condition. °· Will get help  right away if you are not doing well or get worse. °Document Released: 04/27/2005 Document Revised: 07/23/2013 Document Reviewed: 05/23/2011 °[employer]® Patient Information ©2015 [employer]. This information is not intended to replace advice given to you by your health care provider. Make sure you discuss any questions you have with your health care provider. °Fall Prevention and Home Safety °Falls cause injuries and can affect all age groups. It is possible to use preventive measures to significantly decrease the likelihood of falls. There are many simple measures which can make your home safer and prevent falls. °OUTDOORS °· Repair cracks and edges of walkways and driveways. °· Remove high doorway thresholds. °· Trim shrubbery on the main path into your home. °· Have good outside lighting. °· Clear walkways of tools, rocks, debris, and clutter. °· Check that handrails are not broken and are securely fastened. Both sides of steps should have handrails. °· Have leaves, snow, and ice cleared regularly. °· Use sand or salt on walkways during winter months. °· In the garage, clean up grease or oil spills. °BATHROOM °· Install night lights. °· Install grab bars by the toilet and in the tub and shower. °· Use non-skid mats or decals in the tub or shower. °· Place a plastic non-slip stool in the shower to sit on, if needed. °· Keep floors dry and clean up all water on the floor immediately. °· Remove soap buildup in the tub or shower on a regular basis. °· Secure bath mats with non-slip, double-sided rug tape. °· Remove throw rugs and tripping hazards   from the floors. °BEDROOMS °· Install night lights. °· Make sure a bedside light is easy to reach. °· Do not use oversized bedding. °· Keep a telephone by your bedside. °· Have a firm chair with side arms to use for getting dressed. °· Remove throw rugs and tripping hazards from the floor. °KITCHEN °· Keep handles on pots and pans turned toward the center of the stove. Use  back burners when possible. °· Clean up spills quickly and allow time for drying. °· Avoid walking on wet floors. °· Avoid hot utensils and knives. °· Position shelves so they are not too high or low. °· Place commonly used objects within easy reach. °· If necessary, use a sturdy step stool with a grab bar when reaching. °· Keep electrical cables out of the way. °· Do not use floor polish or wax that makes floors slippery. If you must use wax, use non-skid floor wax. °· Remove throw rugs and tripping hazards from the floor. °STAIRWAYS °· Never leave objects on stairs. °· Place handrails on both sides of stairways and use them. Fix any loose handrails. Make sure handrails on both sides of the stairways are as long as the stairs. °· Check carpeting to make sure it is firmly attached along stairs. Make repairs to worn or loose carpet promptly. °· Avoid placing throw rugs at the top or bottom of stairways, or properly secure the rug with carpet tape to prevent slippage. Get rid of throw rugs, if possible. °· Have an electrician put in a light switch at the top and bottom of the stairs. °OTHER FALL PREVENTION TIPS °· Wear low-heel or rubber-soled shoes that are supportive and fit well. Wear closed toe shoes. °· When using a stepladder, make sure it is fully opened and both spreaders are firmly locked. Do not climb a closed stepladder. °· Add color or contrast paint or tape to grab bars and handrails in your home. Place contrasting color strips on first and last steps. °· Learn and use mobility aids as needed. Install an electrical emergency response system. °· Turn on lights to avoid dark areas. Replace light bulbs that burn out immediately. Get light switches that glow. °· Arrange furniture to create clear pathways. Keep furniture in the same place. °· Firmly attach carpet with non-skid or double-sided tape. °· Eliminate uneven floor surfaces. °· Select a carpet pattern that does not visually hide the edge of  steps. °· Be aware of all pets. °OTHER HOME SAFETY TIPS °· Set the water temperature for 120° F (48.8° C). °· Keep emergency numbers on or near the telephone. °· Keep smoke detectors on every level of the home and near sleeping areas. °Document Released: 07/08/2002 Document Revised: 01/17/2012 Document Reviewed: 10/07/2011 °Exit[employer]® Patient Information ©2015 [employer]. This information is not intended to replace advice given to you by your health care provider. Make sure you discuss any questions you have with your health care provider. ° °

## 2014-07-24 NOTE — ED Notes (Signed)
Pt presents to department via [facility]. Walking outside, lost balance and fell onto porch. Denies LOC. C/o L shoulder and neck pain. c-collar and LSB in place upon arrival to ED. Pt is alert and oriented x4.

## 2014-07-26 LAB — URINE CULTURE
Colony Count: >=100000
SPECIAL REQUESTS: NORMAL

## 2014-07-31 ENCOUNTER — Emergency Department: Payer: Self-pay | Admitting: Internal Medicine

## 2014-08-01 DIAGNOSIS — K5792 Diverticulitis of intestine, part unspecified, without perforation or abscess without bleeding: Secondary | ICD-10-CM

## 2014-08-01 HISTORY — DX: Diverticulitis of intestine, part unspecified, without perforation or abscess without bleeding: K57.92

## 2014-08-08 ENCOUNTER — Emergency Department: Payer: Self-pay | Admitting: Emergency Medicine

## 2014-08-09 LAB — URINALYSIS, COMPLETE
BILIRUBIN, UR: NEGATIVE
Bacteria: NONE SEEN
Blood: NEGATIVE
GLUCOSE, UR: NEGATIVE mg/dL (ref 0–75)
Ketone: NEGATIVE
Nitrite: NEGATIVE
Ph: 5 (ref 4.5–8.0)
Protein: NEGATIVE
SPECIFIC GRAVITY: 1.02 (ref 1.003–1.030)

## 2014-08-27 ENCOUNTER — Emergency Department: Payer: Self-pay | Admitting: Emergency Medicine

## 2014-08-27 LAB — COMPREHENSIVE METABOLIC PANEL
ANION GAP: 12 (ref 7–16)
Albumin: 4 g/dL (ref 3.4–5.0)
Alkaline Phosphatase: 93 U/L (ref 46–116)
BUN: 17 mg/dL (ref 7–18)
Bilirubin,Total: 0.7 mg/dL (ref 0.2–1.0)
CO2: 21 mmol/L (ref 21–32)
CREATININE: 1.16 mg/dL (ref 0.60–1.30)
Calcium, Total: 10.4 mg/dL — ABNORMAL HIGH (ref 8.5–10.1)
Chloride: 105 mmol/L (ref 98–107)
EGFR (African American): >60
GFR CALC NON AF AMER: 52 — AB
GLUCOSE: 95 mg/dL (ref 65–99)
OSMOLALITY: 277 (ref 275–301)
Potassium: 3.7 mmol/L (ref 3.5–5.1)
SGOT(AST): 30 U/L (ref 15–37)
SGPT (ALT): 28 U/L (ref 14–63)
SODIUM: 138 mmol/L (ref 136–145)
Total Protein: 8.4 g/dL — ABNORMAL HIGH (ref 6.4–8.2)

## 2014-08-27 LAB — CBC WITH DIFFERENTIAL/PLATELET
Basophil #: 0.1 10*3/uL (ref 0.0–0.1)
Basophil %: 0.5 %
EOS PCT: 0.3 %
Eosinophil #: 0 10*3/uL (ref 0.0–0.7)
HCT: 37.6 % (ref 35.0–47.0)
HGB: 12.5 g/dL (ref 12.0–16.0)
LYMPHS ABS: 2 10*3/uL (ref 1.0–3.6)
Lymphocyte %: 18.1 %
MCH: 26.8 pg (ref 26.0–34.0)
MCHC: 33.2 g/dL (ref 32.0–36.0)
MCV: 81 fL (ref 80–100)
MONO ABS: 0.7 x10 3/mm (ref 0.2–0.9)
Monocyte %: 6.4 %
NEUTROS ABS: 8.4 10*3/uL — AB (ref 1.4–6.5)
Neutrophil %: 74.7 %
PLATELETS: 240 10*3/uL (ref 150–440)
RBC: 4.65 10*6/uL (ref 3.80–5.20)
RDW: 14.3 % (ref 11.5–14.5)
WBC: 11.2 10*3/uL — ABNORMAL HIGH (ref 3.6–11.0)

## 2014-08-27 LAB — URINALYSIS, COMPLETE
BACTERIA: NONE SEEN
BILIRUBIN, UR: NEGATIVE
Blood: NEGATIVE
GLUCOSE, UR: NEGATIVE mg/dL (ref 0–75)
Ketone: NEGATIVE
LEUKOCYTE ESTERASE: NEGATIVE
Nitrite: NEGATIVE
Ph: 8 (ref 4.5–8.0)
Protein: NEGATIVE
Specific Gravity: 1.012 (ref 1.003–1.030)
WBC UR: <1 /HPF (ref 0–5)

## 2014-08-27 LAB — PROTIME-INR
INR: 1.9
Prothrombin Time: 21.6 secs — ABNORMAL HIGH (ref 11.5–14.7)

## 2014-08-27 LAB — LIPASE, BLOOD: Lipase: 183 U/L (ref 73–393)

## 2014-09-07 ENCOUNTER — Emergency Department: Payer: Self-pay | Admitting: Emergency Medicine

## 2014-09-08 LAB — CBC
HCT: 33.6 % — AB (ref 35.0–47.0)
HGB: 11 g/dL — AB (ref 12.0–16.0)
MCH: 26.8 pg (ref 26.0–34.0)
MCHC: 32.8 g/dL (ref 32.0–36.0)
MCV: 82 fL (ref 80–100)
Platelet: 240 10*3/uL (ref 150–440)
RBC: 4.11 10*6/uL (ref 3.80–5.20)
RDW: 14.7 % — ABNORMAL HIGH (ref 11.5–14.5)
WBC: 8 10*3/uL (ref 3.6–11.0)

## 2014-09-08 LAB — COMPREHENSIVE METABOLIC PANEL
ALK PHOS: 79 U/L (ref 46–116)
ALT: 29 U/L (ref 14–63)
Albumin: 3.2 g/dL — ABNORMAL LOW (ref 3.4–5.0)
Anion Gap: 7 (ref 7–16)
BUN: 30 mg/dL — ABNORMAL HIGH (ref 7–18)
Bilirubin,Total: 0.3 mg/dL (ref 0.2–1.0)
CALCIUM: 9.2 mg/dL (ref 8.5–10.1)
CHLORIDE: 109 mmol/L — AB (ref 98–107)
CO2: 25 mmol/L (ref 21–32)
Creatinine: 1.02 mg/dL (ref 0.60–1.30)
Glucose: 91 mg/dL (ref 65–99)
Osmolality: 287 (ref 275–301)
Potassium: 3.5 mmol/L (ref 3.5–5.1)
SGOT(AST): 25 U/L (ref 15–37)
Sodium: 141 mmol/L (ref 136–145)
Total Protein: 6.9 g/dL (ref 6.4–8.2)

## 2014-09-08 LAB — PROTIME-INR
INR: 2
Prothrombin Time: 23.2 secs — ABNORMAL HIGH

## 2014-10-18 ENCOUNTER — Emergency Department (HOSPITAL_COMMUNITY)
Admission: EM | Admit: 2014-10-18 | Discharge: 2014-10-18 | Disposition: A | Discharge status: Home / Self Care | Payer: Medicaid Other | Attending: Emergency Medicine | Admitting: Emergency Medicine

## 2014-10-18 ENCOUNTER — Emergency Department (HOSPITAL_COMMUNITY): Payer: Medicaid Other

## 2014-10-18 ENCOUNTER — Encounter (HOSPITAL_COMMUNITY): Payer: Self-pay | Admitting: *Deleted

## 2014-10-18 DIAGNOSIS — Z86718 Personal history of other venous thrombosis and embolism: Secondary | ICD-10-CM | POA: Diagnosis not present

## 2014-10-18 DIAGNOSIS — Z79899 Other long term (current) drug therapy: Secondary | ICD-10-CM | POA: Insufficient documentation

## 2014-10-18 DIAGNOSIS — Z87891 Personal history of nicotine dependence: Secondary | ICD-10-CM | POA: Insufficient documentation

## 2014-10-18 DIAGNOSIS — J45909 Unspecified asthma, uncomplicated: Secondary | ICD-10-CM | POA: Diagnosis not present

## 2014-10-18 DIAGNOSIS — R42 Dizziness and giddiness: Secondary | ICD-10-CM | POA: Diagnosis not present

## 2014-10-18 DIAGNOSIS — I10 Essential (primary) hypertension: Secondary | ICD-10-CM | POA: Diagnosis not present

## 2014-10-18 DIAGNOSIS — Z9981 Dependence on supplemental oxygen: Secondary | ICD-10-CM | POA: Diagnosis not present

## 2014-10-18 DIAGNOSIS — M1990 Unspecified osteoarthritis, unspecified site: Secondary | ICD-10-CM | POA: Diagnosis not present

## 2014-10-18 DIAGNOSIS — G4733 Obstructive sleep apnea (adult) (pediatric): Secondary | ICD-10-CM | POA: Insufficient documentation

## 2014-10-18 DIAGNOSIS — Z7901 Long term (current) use of anticoagulants: Secondary | ICD-10-CM | POA: Insufficient documentation

## 2014-10-18 DIAGNOSIS — E119 Type 2 diabetes mellitus without complications: Secondary | ICD-10-CM | POA: Diagnosis not present

## 2014-10-18 DIAGNOSIS — Z8719 Personal history of other diseases of the digestive system: Secondary | ICD-10-CM | POA: Insufficient documentation

## 2014-10-18 DIAGNOSIS — R079 Chest pain, unspecified: Secondary | ICD-10-CM | POA: Diagnosis present

## 2014-10-18 DIAGNOSIS — Z86018 Personal history of other benign neoplasm: Secondary | ICD-10-CM | POA: Insufficient documentation

## 2014-10-18 DIAGNOSIS — Z8619 Personal history of other infectious and parasitic diseases: Secondary | ICD-10-CM | POA: Diagnosis not present

## 2014-10-18 DIAGNOSIS — E669 Obesity, unspecified: Secondary | ICD-10-CM | POA: Diagnosis not present

## 2014-10-18 LAB — PROTIME-INR
INR: 1.52 — ABNORMAL HIGH (ref 0.00–1.49)
PROTHROMBIN TIME: 18.4 s — AB (ref 11.6–15.2)

## 2014-10-18 LAB — BASIC METABOLIC PANEL
Anion gap: 9 (ref 5–15)
BUN: 13 mg/dL (ref 6–23)
CHLORIDE: 103 mmol/L (ref 96–112)
CO2: 25 mmol/L (ref 19–32)
Calcium: 9.7 mg/dL (ref 8.4–10.5)
Creatinine, Ser: 0.9 mg/dL (ref 0.50–1.10)
GFR calc non Af Amer: 73 mL/min — ABNORMAL LOW (ref >90)
GFR, EST AFRICAN AMERICAN: 84 mL/min — AB (ref >90)
Glucose, Bld: 100 mg/dL — ABNORMAL HIGH (ref 70–99)
POTASSIUM: 4.2 mmol/L (ref 3.5–5.1)
Sodium: 137 mmol/L (ref 135–145)

## 2014-10-18 LAB — CBC
HCT: 33.5 % — ABNORMAL LOW (ref 36.0–46.0)
Hemoglobin: 11.1 g/dL — ABNORMAL LOW (ref 12.0–15.0)
MCH: 28.5 pg (ref 26.0–34.0)
MCHC: 33.1 g/dL (ref 30.0–36.0)
MCV: 85.9 fL (ref 78.0–100.0)
PLATELETS: 246 10*3/uL (ref 150–400)
RBC: 3.9 MIL/uL (ref 3.87–5.11)
RDW: 14.3 % (ref 11.5–15.5)
WBC: 6.3 10*3/uL (ref 4.0–10.5)

## 2014-10-18 LAB — TROPONIN I: Troponin I: <0.03 ng/mL (ref <0.031)

## 2014-10-18 MED ORDER — MECLIZINE HCL 25 MG PO TABS
25.0000 mg | ORAL_TABLET | Freq: Once | ORAL | Status: AC
  Administered 2014-10-18: 25 mg via ORAL
  Filled 2014-10-18: qty 1

## 2014-10-18 MED ORDER — MECLIZINE HCL 50 MG PO TABS: 25.0000 mg | ORAL_TABLET | Freq: Three times a day (TID) | ORAL | Status: AC

## 2014-10-18 MED ORDER — LORAZEPAM 2 MG PO TABS: 2.0000 mg | ORAL_TABLET | Freq: Three times a day (TID) | ORAL | Status: DC

## 2014-10-18 NOTE — Discharge Instructions (Signed)
As discussed, today's evaluation is largely reassuring.   However, it is important to follow-up with her primary care physician in 2 days.  Please discussed today's evaluation, as well as your medication regimen to insure that you are appropriately addressing the lightheadedness and dizziness.  Please be sure to discuss additional testing, including thyroid evaluation, which may be beneficial in addressing your lightheadedness.  For the next 2 days please take half an additional dose of Coumadin, and have your INR checked with your physician Monday.  Return here for concerning changes in your condition.   Dizziness Dizziness is a common problem. It is a feeling of unsteadiness or light-headedness. You may feel like you are about to faint. Dizziness can lead to injury if you stumble or fall. A person of any age group can suffer from dizziness, but dizziness is more common in older adults. CAUSES  Dizziness can be caused by many different things, including:  Middle ear problems.  Standing for too long.  Infections.  An allergic reaction.  Aging.  An emotional response to something, such as the sight of blood.  Side effects of medicines.  Tiredness.  Problems with circulation or blood pressure.  Excessive use of alcohol or medicines, or illegal drug use.  Breathing too fast (hyperventilation).  An irregular heart rhythm (arrhythmia).  A low red blood cell count (anemia).  Pregnancy.  Vomiting, diarrhea, fever, or other illnesses that cause body fluid loss (dehydration).  Diseases or conditions such as Parkinson's disease, high blood pressure (hypertension), diabetes, and thyroid problems.  Exposure to extreme heat. DIAGNOSIS  Your health care provider will ask about your symptoms, perform a physical exam, and perform an electrocardiogram (ECG) to record the electrical activity of your heart. Your health care provider may also perform other heart or blood tests to  determine the cause of your dizziness. These may include:  Transthoracic echocardiogram (TTE). During echocardiography, sound waves are used to evaluate how blood flows through your heart.  Transesophageal echocardiogram (TEE).  Cardiac monitoring. This allows your health care provider to monitor your heart rate and rhythm in real time.  Holter monitor. This is a portable device that records your heartbeat and can help diagnose heart arrhythmias. It allows your health care provider to track your heart activity for several days if needed.  Stress tests by exercise or by giving medicine that makes the heart beat faster. TREATMENT  Treatment of dizziness depends on the cause of your symptoms and can vary greatly. HOME CARE INSTRUCTIONS   Drink enough fluids to keep your urine clear or pale yellow. This is especially important in very hot weather. In older adults, it is also important in cold weather.  Take your medicine exactly as directed if your dizziness is caused by medicines. When taking blood pressure medicines, it is especially important to get up slowly.  Rise slowly from chairs and steady yourself until you feel okay.  In the morning, first sit up on the side of the bed. When you feel okay, stand slowly while holding onto something until you know your balance is fine.  Move your legs often if you need to stand in one place for a long time. Tighten and relax your muscles in your legs while standing.  Have someone stay with you for 1-2 days if dizziness continues to be a problem. Do this until you feel you are well enough to stay alone. Have the person call your health care provider if he or she notices changes in you  that are concerning.  Do not drive or use heavy machinery if you feel dizzy.  Do not drink alcohol. SEEK IMMEDIATE MEDICAL CARE IF:   Your dizziness or light-headedness gets worse.  You feel nauseous or vomit.  You have problems talking, walking, or using your  arms, hands, or legs.  You feel weak.  You are not thinking clearly or you have trouble forming sentences. It may take a friend or family member to notice this.  You have chest pain, abdominal pain, shortness of breath, or sweating.  Your vision changes.  You notice any bleeding.  You have side effects from medicine that seems to be getting worse rather than better. MAKE SURE YOU:   Understand these instructions.  Will watch your condition.  Will get help right away if you are not doing well or get worse. Document Released: 01/11/2001 Document Revised: 07/23/2013 Document Reviewed: 02/04/2011 [facility] Patient Information 2015 [place], [place]. This information is not intended to replace advice given to you by your health care provider. Make sure you discuss any questions you have with your health care provider.

## 2014-10-18 NOTE — ED Notes (Signed)
Pt is in stable condition upon d/c and is escorted by this RN from ED.

## 2014-10-18 NOTE — ED Provider Notes (Signed)
CSN: [id number]     Arrival date & time 10/18/14  1017 History   First MD Initiated Contact with Patient 10/18/14 1020     Chief Complaint  Patient presents with  . Chest Pain     (Consider location/radiation/quality/duration/timing/severity/associated sxs/prior Treatment) HPI Patient presents with concern of dizziness, lightheadedness. The patient reported chest pain to EMS, she denies any ongoing chest pain to me, when asked several times. She states over the past days she has had occasional lightheadedness, generalized discomfort. No recent change in medication, diet, activity. Symptoms seem worse with supine positioning, and when she is trying to sleep. Patient also has concern for ongoing right hip pain, which she notes is chronic, has been present for a long time, is unchanged recently, with no new loss of sensation or function in the right lower extremity. No recent fever, chills, nausea, vomiting  Past Medical History  Diagnosis Date  . Hypertension   . Depression   . Type 2 diabetes mellitus   . Diverticulitis   . Asthma   . Cyst     pt has "cyst" to legs for years, areas drain at time  . Pancreatitis   . Arthritis   . C. difficile colitis 11/08/2011  . Musculoskeletal pain 11/08/2011  . Neuropathic pain 11/10/2011  . Sleep apnea with use of continuous positive airway pressure (CPAP)     uses CPAP at night  . 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 DVT (deep venous thrombosis)    Past Surgical History  Procedure Laterality Date  . Tonsillectomy    . Cholecystectomy    . Cesarean section    . Nose surgery     Family History  Problem Relation Age of Onset  . Stroke Other   . Cancer Other   . Diabetes Other    History  Substance Use Topics  . Smoking status: Former Research scientist (life sciences)  . Smokeless tobacco: Never Used  . Alcohol Use: No   OB History    Gravida Para Term Preterm AB TAB SAB Ectopic Multiple Living   3 2 2  1  1   2      Review  of Systems  Constitutional:       Per HPI, otherwise negative  HENT:       Per HPI, otherwise negative  Respiratory:       Per HPI, otherwise negative  Cardiovascular:       Per HPI, otherwise negative  Gastrointestinal: Negative for vomiting.  Endocrine:       Negative aside from HPI  Genitourinary:       Neg aside from HPI   Musculoskeletal:       Per HPI, otherwise negative  Skin: Negative.   Neurological: Positive for weakness and light-headedness. Negative for syncope.      Allergies  Hydrocodone; Nitroglycerin; Tramadol; Dilaudid; Levaquin; Trazodone and nefazodone; and Sulfa antibiotics  Home Medications   Prior to Admission medications   Medication Sig Start Date End Date Taking? Authorizing Provider  acetaminophen (TYLENOL) 500 MG tablet Take 500 mg by mouth every 6 (six) hours as needed for mild pain.    Historical Provider, MD  albuterol (PROVENTIL HFA;VENTOLIN HFA) 108 (90 BASE) MCG/ACT inhaler Inhale 2 puffs into the lungs every 4 (four) hours as needed for wheezing or shortness of breath. 07/19/13   Michiel Cowboy, MD  buPROPion (WELLBUTRIN XL) 300 MG 24 hr tablet Take 300 mg by mouth daily.     Historical Provider,  MD  busPIRone (BUSPAR) 15 MG tablet Take 2 tablets (30 mg total) by mouth 2 (two) times daily. 08/15/13   Dixon Boos, MD  cyclobenzaprine (FLEXERIL) 10 MG tablet Take 1 tablet (10 mg total) by mouth 2 (two) times daily as needed for muscle spasms. 07/24/14   Dorie Rank, MD  escitalopram (LEXAPRO) 20 MG tablet Take 20 mg by mouth daily.      Historical Provider, MD  hydrochlorothiazide (HYDRODIURIL) 25 MG tablet Take 25 mg by mouth at bedtime.     Historical Provider, MD  lisinopril (PRINIVIL,ZESTRIL) 10 MG tablet Take 5 mg by mouth daily.     Historical Provider, MD  LORazepam (ATIVAN) 2 MG tablet Take 2 mg by mouth 3 (three) times daily.    Historical Provider, MD  oxyCODONE-acetaminophen (PERCOCET) 5-325 MG per tablet Take 2 tablets by mouth  every 6 (six) hours as needed. 07/24/14   Ernestina Patches, MD  warfarin (COUMADIN) 5 MG tablet Take 7.5-10 mg by mouth daily at 6 PM. Patient take 7.5 mg for Mon Wed Fri AND 10 mg all other days    Historical Provider, MD   BP 151/60 mmHg  Pulse 52  Temp(Src) 97.8 F (36.6 C) (Oral)  Resp 14  Ht 5\' 6"  (1.676 m)  Wt 360 lb (163.295 kg)  BMI 58.13 kg/m2  SpO2 99%  LMP 05/21/2014 Physical Exam  Constitutional: She is oriented to person, place, and time. She appears well-developed and well-nourished. No distress.  Obese female resting, awake and alert, interacting appropriately  HENT:  Head: Normocephalic and atraumatic.  Eyes: Conjunctivae and EOM are normal.  Cardiovascular: Normal rate and regular rhythm.   Pulmonary/Chest: Effort normal and breath sounds normal. No stridor. No respiratory distress. She has no wheezes.  Abdominal: She exhibits no distension. There is no tenderness. There is no rebound.  Musculoskeletal: She exhibits no edema.  Neurological: She is alert and oriented to person, place, and time. No cranial nerve deficit. She exhibits normal muscle tone. Coordination normal.  Skin: Skin is warm and dry.  Psychiatric: She has a normal mood and affect.  Nursing note and vitals reviewed.   ED Course  Procedures (including critical care time) Labs Review Labs Reviewed  BASIC METABOLIC PANEL - Abnormal; Notable for the following:    Glucose, Bld 100 (*)    GFR calc non Af Amer 73 (*)    GFR calc Af Amer 84 (*)    All other components within normal limits  CBC - Abnormal; Notable for the following:    Hemoglobin 11.1 (*)    HCT 33.5 (*)    All other components within normal limits  PROTIME-INR - Abnormal; Notable for the following:    Prothrombin Time 18.4 (*)    INR 1.52 (*)    All other components within normal limits  TROPONIN I    Imaging Review Dg Chest 2 View  10/18/2014   CLINICAL DATA:  Shortness of breath and central chest pain since 5 a.m. this  morning, initial evaluation, personal history of atrial fibrillation hypertension and diabetes  EXAM: CHEST  2 VIEW  COMPARISON:  05/23/2014  FINDINGS: Mild cardiac enlargement stable. Vascular pattern normal. Lungs clear. No effusions.  IMPRESSION: No active cardiopulmonary disease.   Electronically Signed   By: Skipper Cliche M.D.   On: 10/18/2014 11:49     EKG Interpretation   Date/Time:  Saturday October 18 2014 10:17:38 EDT Ventricular Rate:  56 PR Interval:  188 QRS Duration: 116 QT  Interval:  409 QTC Calculation: 395 R Axis:   3 Text Interpretation:  Sinus rhythm Nonspecific intraventricular conduction  delay Low voltage, precordial leads Consider anterior infarct Sinus rhythm  Non-specific intra-ventricular conduction delay No significant change  since last tracing Abnormal ekg Confirmed by Carmin Muskrat  MD [phone])  on 10/18/2014 11:27:16 AM     Patient in no distress.  MDM  Patient presents with ongoing lightheadedness, dizziness, but no chest pain, no evidence for distress or imminent decompensation. Patient is a soft, nontender abdomen, neurovascularly intact throughout and has no evidence for respiratory comfort otherwise.  Patient is not hypoxic or tachypneic, and though she is some therapeutic, in terms of INR, there is low suspicion for new DVT or PE. Patient was advised to increase her Coumadin dose, have her INR checked again soon with her primary care physician to ensure appropriate anticoagulation benefit. With no chest pain, nonischemic EKG, there is low suspicion for ongoing coronary ischemia. With no focal neurologic deficits, there is low suspicion for central intracranial process. Patient was discharged with medication for her lightheadedness/dizziness, and will follow-up with primary care.      Carmin Muskrat, MD 10/18/14 [phone]

## 2014-10-18 NOTE — ED Notes (Signed)
Pt arrives from home via [facility]. Pt states she has chronic hip pain that will radiate into her abdomen, but today it has radiated to epigastric/central chest area. Pt has exertional sob and states she has bad anxiety and feels like this could be the cause of her cp which is a 3/10. Pt denies radiation to arm or back, nausea, dizziness, or diaphoresis. Pt states she goes to a pain clinic rt chronic pain issues and took Percocet 10-325 at 0830 this morning which she takes tid. Pt has a hx of dvt and pe and is currently on coumadin. Pt doesn't take nitro b/c she states it gives her panic attacks. Pt did receive 324mg  of aspirin pta.

## 2014-10-24 ENCOUNTER — Emergency Department: Payer: Self-pay | Admitting: Internal Medicine

## 2014-10-24 LAB — COMPREHENSIVE METABOLIC PANEL
ALK PHOS: 69 U/L
AST: 20 U/L
Albumin: 3.9 g/dL
Anion Gap: 8 (ref 7–16)
BILIRUBIN TOTAL: 0.6 mg/dL
BUN: 18 mg/dL
CHLORIDE: 105 mmol/L
CO2: 25 mmol/L
Calcium, Total: 9.5 mg/dL
Creatinine: 0.77 mg/dL
EGFR (African American): >60
Glucose: 93 mg/dL
Potassium: 3.9 mmol/L
SGPT (ALT): 17 U/L
SODIUM: 138 mmol/L
Total Protein: 7.3 g/dL

## 2014-10-24 LAB — CBC
HCT: 35.4 % (ref 35.0–47.0)
HGB: 11.9 g/dL — ABNORMAL LOW (ref 12.0–16.0)
MCH: 28.7 pg (ref 26.0–34.0)
MCHC: 33.6 g/dL (ref 32.0–36.0)
MCV: 86 fL (ref 80–100)
Platelet: 239 10*3/uL (ref 150–440)
RBC: 4.14 10*6/uL (ref 3.80–5.20)
RDW: 14.6 % — ABNORMAL HIGH (ref 11.5–14.5)
WBC: 6.2 10*3/uL (ref 3.6–11.0)

## 2014-10-24 LAB — URINALYSIS, COMPLETE
BACTERIA: NONE SEEN
Bilirubin,UR: NEGATIVE
Blood: NEGATIVE
GLUCOSE, UR: NEGATIVE mg/dL (ref 0–75)
Ketone: NEGATIVE
Leukocyte Esterase: NEGATIVE
NITRITE: NEGATIVE
Ph: 5 (ref 4.5–8.0)
Protein: NEGATIVE
RBC,UR: <1 /HPF (ref 0–5)
Specific Gravity: 1.023 (ref 1.003–1.030)
Squamous Epithelial: 6

## 2014-10-24 LAB — LIPASE, BLOOD: LIPASE: 36 U/L

## 2014-10-24 LAB — PROTIME-INR
INR: 1.7
PROTHROMBIN TIME: 20.5 s — AB

## 2014-11-24 ENCOUNTER — Other Ambulatory Visit
Admit: 2014-11-24 | Disposition: A | Discharge status: Home / Self Care | Payer: Self-pay | Attending: Vascular Surgery | Admitting: Vascular Surgery

## 2014-11-24 LAB — PROTIME-INR
INR: 1.8
PROTHROMBIN TIME: 20.9 s — AB

## 2014-12-01 ENCOUNTER — Emergency Department
Admission: EM | Admit: 2014-12-01 | Discharge: 2014-12-01 | Disposition: A | Discharge status: Home / Self Care | Payer: Medicaid Other | Attending: Emergency Medicine | Admitting: Emergency Medicine

## 2014-12-01 ENCOUNTER — Emergency Department: Payer: Medicaid Other

## 2014-12-01 ENCOUNTER — Encounter: Payer: Self-pay | Admitting: Emergency Medicine

## 2014-12-01 DIAGNOSIS — M79604 Pain in right leg: Secondary | ICD-10-CM | POA: Diagnosis present

## 2014-12-01 DIAGNOSIS — E119 Type 2 diabetes mellitus without complications: Secondary | ICD-10-CM | POA: Insufficient documentation

## 2014-12-01 DIAGNOSIS — Z79899 Other long term (current) drug therapy: Secondary | ICD-10-CM | POA: Insufficient documentation

## 2014-12-01 DIAGNOSIS — R350 Frequency of micturition: Secondary | ICD-10-CM | POA: Insufficient documentation

## 2014-12-01 DIAGNOSIS — F419 Anxiety disorder, unspecified: Secondary | ICD-10-CM | POA: Diagnosis not present

## 2014-12-01 DIAGNOSIS — F329 Major depressive disorder, single episode, unspecified: Secondary | ICD-10-CM | POA: Diagnosis not present

## 2014-12-01 DIAGNOSIS — Z87891 Personal history of nicotine dependence: Secondary | ICD-10-CM | POA: Diagnosis not present

## 2014-12-01 DIAGNOSIS — I10 Essential (primary) hypertension: Secondary | ICD-10-CM | POA: Insufficient documentation

## 2014-12-01 DIAGNOSIS — Z7901 Long term (current) use of anticoagulants: Secondary | ICD-10-CM | POA: Diagnosis not present

## 2014-12-01 LAB — URINALYSIS COMPLETE WITH MICROSCOPIC (ARMC ONLY)
BILIRUBIN URINE: NEGATIVE
Glucose, UA: NEGATIVE mg/dL
Hgb urine dipstick: NEGATIVE
KETONES UR: NEGATIVE mg/dL
Leukocytes, UA: NEGATIVE
NITRITE: NEGATIVE
Protein, ur: NEGATIVE mg/dL
SPECIFIC GRAVITY, URINE: 1.02 (ref 1.005–1.030)
pH: 5 (ref 5.0–8.0)

## 2014-12-01 LAB — PROTIME-INR
INR: 1.44
Prothrombin Time: 17.7 seconds — ABNORMAL HIGH (ref 11.4–15.0)

## 2014-12-01 NOTE — ED Notes (Signed)
Patient transported to Ultrasound 

## 2014-12-01 NOTE — ED Provider Notes (Signed)
[facility] Emergency Department Provider Note    ____________________________________________  Time seen: 7:40 AM  I have reviewed the triage vital signs and the nursing notes.   HISTORY  Chief Complaint Leg Pain and Urinary Frequency     HPI Stacey [name] is a 54 y.o. female who presents with right leg pain. 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 She has been on Coumadin for that which has made the pain go away. Yesterday she developed pain in her right thigh similar to the DVT that she has had before area she describes it as cramping and moderate in intensity. Ambulation makes the pain worse. Resting makes the pain better. No recent surgeries. No shortness of breath     Past Medical History  Diagnosis Date  . Hypertension   . Depression   . Type 2 diabetes mellitus   . Diverticulitis   . Asthma   . Cyst     pt has "cyst" to legs for years, areas drain at time  . Pancreatitis   . Arthritis   . C. difficile colitis 11/08/2011  . Musculoskeletal pain 11/08/2011  . Neuropathic pain 11/10/2011  . Sleep apnea with use of continuous positive airway pressure (CPAP)     uses CPAP at night  . 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 DVT (deep venous thrombosis)     Patient Active Problem List   Diagnosis Date Noted  . Chest pain 08/14/2013  . Atypical chest pain 08/14/2013  . Localized swelling, mass, or lump of right lower extremity 08/14/2013  . Pulmonary embolism 07/16/2013  . Short of breath on exertion 07/16/2013  . Cellulitis of leg, right 06/03/2013  . Anemia 02/18/2013  . Diabetes mellitus type II, controlled 02/17/2013  . 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 Leukocytosis 02/07/2013  . LLQ abdominal pain 10/28/2012  . UTI (lower urinary tract infection) 10/28/2012  . Dyspnea 10/28/2012  . OSA (obstructive sleep apnea) 10/28/2012  . RLQ abdominal  pain 06/20/2012  . Neuropathic pain 11/10/2011  . C. difficile colitis 11/08/2011  . Musculoskeletal pain 11/08/2011  . Bradycardia 11/08/2011  . PVC's (premature ventricular contractions) 11/07/2011  . Chest pain, atypical 11/07/2011  . Gastroenteritis 11/06/2011  . Heart palpitations 11/06/2011  . Morbid obesity 11/06/2011  . Anxiety and depression 11/06/2011  . HTN (hypertension) 11/06/2011  . Depression 11/06/2011    Past Surgical History  Procedure Laterality Date  . Tonsillectomy    . Cholecystectomy    . Cesarean section    . Nose surgery      Current Outpatient Rx  Name  Route  Sig  Dispense  Refill  . acetaminophen (TYLENOL) 500 MG tablet   Oral   Take 500 mg by mouth every 6 (six) hours as needed for mild pain.         Marland Kitchen albuterol (PROVENTIL HFA;VENTOLIN HFA) 108 (90 BASE) MCG/ACT inhaler   Inhalation   Inhale 2 puffs into the lungs every 4 (four) hours as needed for wheezing or shortness of breath.   1 Inhaler   0   . buPROPion (WELLBUTRIN XL) 300 MG 24 hr tablet   Oral   Take 300 mg by mouth daily.          . busPIRone (BUSPAR) 15 MG tablet   Oral   Take 2 tablets (30 mg total) by mouth 2 (two) times daily.  120 tablet   1   . escitalopram (LEXAPRO) 20 MG tablet   Oral   Take 20 mg by mouth daily.           . hydrochlorothiazide (HYDRODIURIL) 25 MG tablet   Oral   Take 25 mg by mouth at bedtime.          Marland Kitchen lisinopril (PRINIVIL,ZESTRIL) 10 MG tablet   Oral   Take 5 mg by mouth daily.          Marland Kitchen LORazepam (ATIVAN) 2 MG tablet   Oral   Take 1 tablet (2 mg total) by mouth 3 (three) times daily.   15 tablet   0   . oxyCODONE-acetaminophen (PERCOCET) 5-325 MG per tablet   Oral   Take 2 tablets by mouth every 6 (six) hours as needed.   15 tablet   0   . warfarin (COUMADIN) 5 MG tablet   Oral   Take 7.5-10 mg by mouth daily at 6 PM. Patient take 7.5 mg for Mon Wed Fri AND 10 mg all other days           Allergies Hydrocodone;  Nitroglycerin; Tramadol; Dilaudid; Levaquin; Trazodone and nefazodone; and Sulfa antibiotics  Family History  Problem Relation Age of Onset  . Stroke Other   . Cancer Other   . Diabetes Other     Social History History  Substance Use Topics  . Smoking status: Former Research scientist (life sciences)  . Smokeless tobacco: Never Used  . Alcohol Use: No    Review of Systems  Constitutional: Negative for fever. Eyes: Negative for visual changes. ENT: Negative for sore throat. Cardiovascular: Negative for chest pain. Respiratory: Negative for shortness of breath. Gastrointestinal: Negative for abdominal pain, vomiting and diarrhea. Genitourinary: Negative for dysuria. Musculoskeletal: Negative for back pain. Positive for right leg pain Skin: Positive for bug bite Neurological: Negative for headaches, focal weakness or numbness. Psychiatric: Positive for anxiety  10-point ROS otherwise negative.  ____________________________________________   PHYSICAL EXAM:  VITAL SIGNS: ED Triage Vitals  Enc Vitals Group     BP 12/01/14 0714 154/76 mmHg     Pulse Rate 12/01/14 0714 65     Resp 12/01/14 0714 22     Temp 12/01/14 0714 98.2 F (36.8 C)     Temp Source 12/01/14 0714 Oral     SpO2 12/01/14 0714 99 %     Weight 12/01/14 0714 360 lb (163.295 kg)     Height 12/01/14 0714 5\' 6"  (1.676 m)     Head Cir --      Peak Flow --      Pain Score 12/01/14 0715 7     Pain Loc --      Pain Edu? --      Excl. in [place]? --      Constitutional: Alert and oriented. No acute distress. Morbidly obese Eyes: Conjunctivae are normal. PERRL. Normal extraocular movements. ENT   Head: Normocephalic and atraumatic.   Nose: No congestion/rhinnorhea.   Mouth/Throat: Mucous membranes are moist.   Neck: No stridor. Hematological/Lymphatic/Immunilogical: No cervical lymphadenopathy. Cardiovascular: Normal rate, regular rhythm. Normal and symmetric distal pulses are present in all extremities. No murmurs, rubs,  or gallops. Respiratory: Normal respiratory effort without tachypnea nor retractions. Breath sounds are clear and equal bilaterally. No wheezes/rales/rhonchi. Gastrointestinal: Soft and nontender. No distention. No abdominal bruits. There is no CVA tenderness. Genitourinary: Erythema consistent with fungal rash beneath pannus. Musculoskeletal: Nontender with normal range of motion in all extremities. No joint effusions.  No lower extremity tenderness nor edema. No calf tenderness, no calf swelling Neurologic:  Normal speech and language. No gross focal neurologic deficits are appreciated. Speech is normal. No gait instability. Skin:  Skin is warm, dry and intact. No rash noted. Small insect bite right lower extremity posterior knee. No erythema no evidence of cellulitis. Psychiatric: Mood and affect are normal. Speech and behavior are normal. Patient exhibits appropriate insight and judgment.  ____________________________________________   EKG  None  ____________________________________________    RADIOLOGY  Ultrasound negative for DVT  ____________________________________________   PROCEDURES  Procedure(s) performed: None  Critical Care performed: No  ____________________________________________   INITIAL IMPRESSION / ASSESSMENT AND PLAN / ED COURSE  Pertinent labs & imaging results that were available during my care of the patient were reviewed by me and considered in my medical decision making (see chart for details).  Will obtain INR and ultrasound right lower extremity.  ____________________________________________   FINAL CLINICAL IMPRESSION(S) / ED DIAGNOSES  Final diagnoses:  Lower extremity pain, right     Lavonia Drafts, MD 12/01/14 1610

## 2014-12-01 NOTE — ED Notes (Signed)
Patient reports history of blood clot to right leg, first noticed in December. Continues to be on Coumadin therapy.  Today reports having pain again to that leg that feels like it did before, hurting to stand up. Patient also reports urinary frequency and burning. Patient also requests having Coumadin level checked.

## 2014-12-01 NOTE — Discharge Instructions (Signed)

## 2014-12-20 ENCOUNTER — Encounter: Payer: Self-pay | Admitting: *Deleted

## 2014-12-20 ENCOUNTER — Emergency Department
Admission: EM | Admit: 2014-12-20 | Discharge: 2014-12-20 | Disposition: A | Discharge status: Home / Self Care | Payer: Medicaid Other | Attending: Emergency Medicine | Admitting: Emergency Medicine

## 2014-12-20 ENCOUNTER — Other Ambulatory Visit: Payer: Self-pay

## 2014-12-20 ENCOUNTER — Emergency Department: Payer: Medicaid Other

## 2014-12-20 DIAGNOSIS — J45901 Unspecified asthma with (acute) exacerbation: Secondary | ICD-10-CM | POA: Diagnosis not present

## 2014-12-20 DIAGNOSIS — M79604 Pain in right leg: Secondary | ICD-10-CM | POA: Diagnosis present

## 2014-12-20 DIAGNOSIS — Z79899 Other long term (current) drug therapy: Secondary | ICD-10-CM | POA: Diagnosis not present

## 2014-12-20 DIAGNOSIS — E119 Type 2 diabetes mellitus without complications: Secondary | ICD-10-CM | POA: Diagnosis not present

## 2014-12-20 DIAGNOSIS — G8929 Other chronic pain: Secondary | ICD-10-CM | POA: Diagnosis not present

## 2014-12-20 DIAGNOSIS — I10 Essential (primary) hypertension: Secondary | ICD-10-CM | POA: Diagnosis not present

## 2014-12-20 DIAGNOSIS — Z87891 Personal history of nicotine dependence: Secondary | ICD-10-CM | POA: Diagnosis not present

## 2014-12-20 DIAGNOSIS — F419 Anxiety disorder, unspecified: Secondary | ICD-10-CM | POA: Diagnosis not present

## 2014-12-20 DIAGNOSIS — Z7901 Long term (current) use of anticoagulants: Secondary | ICD-10-CM | POA: Diagnosis not present

## 2014-12-20 NOTE — Discharge Instructions (Signed)

## 2014-12-20 NOTE — ED Provider Notes (Signed)
[name] Emergency Department Provider Note  ____________________________________________  Time seen: 7:30 AM  I have reviewed the triage vital signs and the nursing notes.   HISTORY  Chief Complaint Shortness of Breath and Leg Pain    HPI Stacey [name] is a 54 y.o. female who presents with complaints of right leg pain that is moderate and seems to radiate from her right knee. Patient is extremely concerned that she may have a DVT despite the fact that she had an ultrasound 2 weeks ago that was normal. She called her doctor who referred her to the emergency department. Patient has been compliant with her Coumadin. She tells me that if she doesn't have an ultrasound she is going to "go home and eat Ativan like [place]". I discussed with her that outpatient ultrasound is appropriate given her continued and chronic right leg pain. She complains of baseline shortness of breath likely secondary to obesity.     Past Medical History  Diagnosis Date  . Hypertension   . Depression   . Type 2 diabetes mellitus   . Diverticulitis   . Asthma   . Cyst     pt has "cyst" to legs for years, areas drain at time  . Pancreatitis   . Arthritis   . C. difficile colitis 11/08/2011  . Musculoskeletal pain 11/08/2011  . Neuropathic pain 11/10/2011  . Sleep apnea with use of continuous positive airway pressure (CPAP)     uses CPAP at night  . 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 DVT (deep venous thrombosis)     Patient Active Problem List   Diagnosis Date Noted  . Chest pain 08/14/2013  . Atypical chest pain 08/14/2013  . Localized swelling, mass, or lump of right lower extremity 08/14/2013  . Pulmonary embolism 07/16/2013  . Short of breath on exertion 07/16/2013  . Cellulitis of leg, right 06/03/2013  . Anemia 02/18/2013  . Diabetes mellitus type II, controlled 02/17/2013  . Benign lipomatous neoplasm of skin, subcu of right leg  02/17/2013  . Leukocytosis 02/07/2013  . LLQ abdominal pain 10/28/2012  . UTI (lower urinary tract infection) 10/28/2012  . Dyspnea 10/28/2012  . OSA (obstructive sleep apnea) 10/28/2012  . RLQ abdominal pain 06/20/2012  . Neuropathic pain 11/10/2011  . C. difficile colitis 11/08/2011  . Musculoskeletal pain 11/08/2011  . Bradycardia 11/08/2011  . PVC's (premature ventricular contractions) 11/07/2011  . Chest pain, atypical 11/07/2011  . Gastroenteritis 11/06/2011  . Heart palpitations 11/06/2011  . Morbid obesity 11/06/2011  . Anxiety and depression 11/06/2011  . HTN (hypertension) 11/06/2011  . Depression 11/06/2011    Past Surgical History  Procedure Laterality Date  . Tonsillectomy    . Cholecystectomy    . Cesarean section    . Nose surgery      Current Outpatient Rx  Name  Route  Sig  Dispense  Refill  . acetaminophen (TYLENOL) 500 MG tablet   Oral   Take 500 mg by mouth every 6 (six) hours as needed for mild pain.         Marland Kitchen albuterol (PROVENTIL HFA;VENTOLIN HFA) 108 (90 BASE) MCG/ACT inhaler   Inhalation   Inhale 2 puffs into the lungs every 4 (four) hours as needed for wheezing or shortness of breath.   1 Inhaler   0   . buPROPion (WELLBUTRIN XL) 300 MG 24 hr tablet   Oral   Take 300 mg by mouth daily.          Marland Kitchen  busPIRone (BUSPAR) 15 MG tablet   Oral   Take 2 tablets (30 mg total) by mouth 2 (two) times daily.   120 tablet   1   . escitalopram (LEXAPRO) 20 MG tablet   Oral   Take 20 mg by mouth daily.           . hydrochlorothiazide (HYDRODIURIL) 25 MG tablet   Oral   Take 25 mg by mouth at bedtime.          Marland Kitchen lisinopril (PRINIVIL,ZESTRIL) 10 MG tablet   Oral   Take 5 mg by mouth daily.          Marland Kitchen LORazepam (ATIVAN) 2 MG tablet   Oral   Take 1 tablet (2 mg total) by mouth 3 (three) times daily.   15 tablet   0   . oxyCODONE-acetaminophen (PERCOCET) 5-325 MG per tablet   Oral   Take 2 tablets by mouth every 6 (six) hours as  needed.   15 tablet   0   . warfarin (COUMADIN) 5 MG tablet   Oral   Take 7.5-10 mg by mouth daily at 6 PM. Patient take 7.5 mg for Mon Wed Fri AND 10 mg all other days           Allergies Hydrocodone; Nitroglycerin; Tramadol; Dilaudid; Levaquin; Toradol; Trazodone and nefazodone; and Sulfa antibiotics  Family History  Problem Relation Age of Onset  . Stroke Other   . Cancer Other   . Diabetes Other     Social History History  Substance Use Topics  . Smoking status: Former Research scientist (life sciences)  . Smokeless tobacco: Never Used  . Alcohol Use: No    Review of Systems  Constitutional: Negative for fever. Eyes: Negative for visual changes. ENT: Negative for sore throat. Cardiovascular: Negative for chest pain. Respiratory: Positive for shortness of breath Gastrointestinal: Negative for abdominal pain, vomiting and diarrhea. Genitourinary: Negative for dysuria. Musculoskeletal: Negative for back pain. Positive for right leg pain Skin: Negative for rash. Neurological: Negative for headaches, focal weakness or numbness. Psychiatric: Positive for anxiety  10-point ROS otherwise negative.  ____________________________________________   PHYSICAL EXAM:  VITAL SIGNS: ED Triage Vitals  Enc Vitals Group     BP 12/20/14 0659 142/71 mmHg     Pulse Rate 12/20/14 0659 71     Resp 12/20/14 0659 16     Temp 12/20/14 0659 98.1 F (36.7 C)     Temp Source 12/20/14 0659 Oral     SpO2 12/20/14 0659 100 %     Weight --      Height 12/20/14 0659 5\' 6"  (1.676 m)     Head Cir --      Peak Flow --      Pain Score 12/20/14 0725 10     Pain Loc --      Pain Edu? --      Excl. in [place]? --      Constitutional: Alert and oriented. Well appearing but anxious ENT   Neck: No stridor. Cardiovascular: Normal rate, regular rhythm. Normal and symmetric distal pulses are present in all extremities. No murmurs, rubs, or gallops. Respiratory: Normal respiratory effort without tachypnea nor  retractions. Breath sounds are clear and equal bilaterally. No wheezes/rales/rhonchi. Gastrointestinal: Soft and nontender. No distention. There is no CVA tenderness. Genitourinary: deferred Musculoskeletal: Nontender with normal range of motion in all extremities. No calf tenderness to palpation.  Neurologic:  Normal speech and language. No gross focal neurologic deficits are appreciated. Speech is normal.  Skin:  Skin is warm, dry and intact. No rash noted. Psychiatric: Mood and affect are normal although patient is overly anxious  ____________________________________________    LABS (pertinent positives/negatives)  None  ____________________________________________   EKG I interpreted the following EKG  Date: 12/20/2014  Rate: 62  Rhythm: normal sinus rhythm  QRS Axis: normal  Intervals: normal  ST/T Wave abnormalities: normal  Conduction Disutrbances: none  Narrative Interpretation: unremarkable      ____________________________________________    RADIOLOGY  Chest x-ray unremarkable  ____________________________________________   PROCEDURES  Procedure(s) performed: Emergency bedside ultrasound: Right DVT study, limited secondary to body habitus but no DVT seen, normal flow  Critical Care performed: None  ____________________________________________   INITIAL IMPRESSION / ASSESSMENT AND PLAN / ED COURSE  Pertinent labs & imaging results that were available during my care of the patient were reviewed by me and considered in my medical decision making (see chart for details).  Discussed with patient her anxiety related to DVTs in that she is already on the appropriate treatment which his Coumadin, which she states she is compliant with. Given normal heart rate, 100% pulse ox, normal EKG, normal bedside ultrasound and normal x-ray feel that patient is appropriate for outpatient follow-up with her  doctor.  ____________________________________________   FINAL CLINICAL IMPRESSION(S) / ED DIAGNOSES  Final diagnoses:  Leg pain, diffuse, right     Lavonia Drafts, MD 12/20/14 [phone]

## 2014-12-20 NOTE — ED Notes (Signed)
NAD noted at time of D/C. Pt denies questions or concerns. Pt taken to the lobby via wheelchair at this time.  

## 2014-12-20 NOTE — ED Notes (Addendum)
Pt presents w/ c/o R leg pain in both thigh and calf x 3 days. Pt c/o worsening shortness of breath x 4 days. Pt c/o chest tightness starting x 4 days ago after shortness of breath. Pt has hx of PE and DVT in R leg. Pt in no respiratory distress that is observable, but she is taking deep breaths that are long and deliberate. Pt states she feels as if she cannot take a deep breath. Pt endorses hx of anxiety and has been compliant w/ her meds for this condition.

## 2014-12-20 NOTE — ED Notes (Signed)
MD at bedside. 

## 2015-02-06 ENCOUNTER — Other Ambulatory Visit: Payer: Self-pay | Admitting: Vascular Surgery

## 2015-02-06 DIAGNOSIS — M7989 Other specified soft tissue disorders: Secondary | ICD-10-CM

## 2015-02-09 ENCOUNTER — Ambulatory Visit: Payer: Medicaid Other

## 2015-02-10 ENCOUNTER — Ambulatory Visit
Admission: RE | Admit: 2015-02-10 | Discharge: 2015-02-10 | Disposition: A | Discharge status: Home / Self Care | Payer: Medicaid Other | Source: Ambulatory Visit | Attending: Vascular Surgery | Admitting: Vascular Surgery

## 2015-02-10 DIAGNOSIS — M7989 Other specified soft tissue disorders: Secondary | ICD-10-CM | POA: Insufficient documentation

## 2015-03-23 ENCOUNTER — Emergency Department
Admission: EM | Admit: 2015-03-23 | Discharge: 2015-03-23 | Disposition: A | Discharge status: Home / Self Care | Payer: Medicaid Other | Attending: Emergency Medicine | Admitting: Emergency Medicine

## 2015-03-23 ENCOUNTER — Encounter: Payer: Self-pay | Admitting: Emergency Medicine

## 2015-03-23 DIAGNOSIS — S39012A Strain of muscle, fascia and tendon of lower back, initial encounter: Secondary | ICD-10-CM | POA: Diagnosis not present

## 2015-03-23 DIAGNOSIS — Y9289 Other specified places as the place of occurrence of the external cause: Secondary | ICD-10-CM | POA: Diagnosis not present

## 2015-03-23 DIAGNOSIS — Y9389 Activity, other specified: Secondary | ICD-10-CM | POA: Diagnosis not present

## 2015-03-23 DIAGNOSIS — Z7901 Long term (current) use of anticoagulants: Secondary | ICD-10-CM | POA: Insufficient documentation

## 2015-03-23 DIAGNOSIS — Z87891 Personal history of nicotine dependence: Secondary | ICD-10-CM | POA: Diagnosis not present

## 2015-03-23 DIAGNOSIS — E119 Type 2 diabetes mellitus without complications: Secondary | ICD-10-CM | POA: Diagnosis not present

## 2015-03-23 DIAGNOSIS — Z79899 Other long term (current) drug therapy: Secondary | ICD-10-CM | POA: Insufficient documentation

## 2015-03-23 DIAGNOSIS — X58XXXA Exposure to other specified factors, initial encounter: Secondary | ICD-10-CM | POA: Diagnosis not present

## 2015-03-23 DIAGNOSIS — I10 Essential (primary) hypertension: Secondary | ICD-10-CM | POA: Insufficient documentation

## 2015-03-23 DIAGNOSIS — Y998 Other external cause status: Secondary | ICD-10-CM | POA: Diagnosis not present

## 2015-03-23 DIAGNOSIS — S3992XA Unspecified injury of lower back, initial encounter: Secondary | ICD-10-CM | POA: Diagnosis present

## 2015-03-23 DIAGNOSIS — G8929 Other chronic pain: Secondary | ICD-10-CM | POA: Insufficient documentation

## 2015-03-23 MED ORDER — DIAZEPAM 5 MG PO TABS: 5.0000 mg | ORAL_TABLET | Freq: Three times a day (TID) | ORAL | Status: DC | PRN

## 2015-03-23 NOTE — Discharge Instructions (Signed)
Back Pain, Adult °Back pain is very common. The pain often gets better over time. The cause of back pain is usually not dangerous. Most people can learn to manage their back pain on their own.  °HOME CARE  °· Stay active. Start with short walks on flat ground if you can. Try to walk farther each day. °· Do not sit, drive, or stand in one place for more than 30 minutes. Do not stay in bed. °· Do not avoid exercise or work. Activity can help your back heal faster. °· Be careful when you bend or lift an object. Bend at your knees, keep the object close to you, and do not twist. °· Sleep on a firm mattress. Lie on your side, and bend your knees. If you lie on your back, put a pillow under your knees. °· Only take medicines as told by your doctor. °· Put ice on the injured area. °¨ Put ice in a plastic bag. °¨ Place a towel between your skin and the bag. °¨ Leave the ice on for 15-20 minutes, 03-04 times a day for the first 2 to 3 days. After that, you can switch between ice and heat packs. °· Ask your doctor about back exercises or massage. °· Avoid feeling anxious or stressed. Find good ways to deal with stress, such as exercise. °GET HELP RIGHT AWAY IF:  °· Your pain does not go away with rest or medicine. °· Your pain does not go away in 1 week. °· You have new problems. °· You do not feel well. °· The pain spreads into your legs. °· You cannot control when you poop (bowel movement) or pee (urinate). °· Your arms or legs feel weak or lose feeling (numbness). °· You feel sick to your stomach (nauseous) or throw up (vomit). °· You have belly (abdominal) pain. °· You feel like you may pass out (faint). °MAKE SURE YOU:  °· Understand these instructions. °· Will watch your condition. °· Will get help right away if you are not doing well or get worse. °Document Released: 01/04/2008 Document Revised: 10/10/2011 Document Reviewed: 11/19/2013 °[employer]® Patient Information ©2015 [employer]. This information is not intended  to replace advice given to you by your health care provider. Make sure you discuss any questions you have with your health care provider. ° °
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
## 2015-03-23 NOTE — ED Notes (Signed)
Back pai  That worsened on ?fri or sat.

## 2015-03-23 NOTE — ED Provider Notes (Signed)
[facility] Emergency Department Provider Note  ____________________________________________  Time seen: On arrival  I have reviewed the triage vital signs and the nursing notes.   HISTORY  Chief Complaint Back Pain    HPI Stacey [name] is a 54 y.o. female with past medical history including morbid obesity, DVT, chronic right hip pain and back pain who presents today with back pain. She reports she is in a pain management clinic and cannot have narcotics. She reports her back started hurting proximally 5 days ago and has progressively gotten somewhat worse. She reports the pain is paraspinal on the right side mostly in the lower back. She denies dysuria. She has had no fevers or chills. She denies focal deficits but does complain of chronic right hip pain where she says she needs a replacement.    Past Medical History  Diagnosis Date  . Hypertension   . Depression   . Type 2 diabetes mellitus   . Diverticulitis   . Asthma   . Cyst     pt has "cyst" to legs for years, areas drain at time  . Pancreatitis   . Arthritis   . C. difficile colitis 11/08/2011  . Musculoskeletal pain 11/08/2011  . Neuropathic pain 11/10/2011  . Sleep apnea with use of continuous positive airway pressure (CPAP)     uses CPAP at night  . 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 DVT (deep venous thrombosis)     Patient Active Problem List   Diagnosis Date Noted  . Chest pain 08/14/2013  . Atypical chest pain 08/14/2013  . Localized swelling, mass, or lump of right lower extremity 08/14/2013  . Pulmonary embolism 07/16/2013  . Short of breath on exertion 07/16/2013  . Cellulitis of leg, right 06/03/2013  . Anemia 02/18/2013  . Diabetes mellitus type II, controlled 02/17/2013  . 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 Leukocytosis 02/07/2013  . LLQ abdominal pain 10/28/2012  . UTI (lower urinary tract infection)  10/28/2012  . Dyspnea 10/28/2012  . OSA (obstructive sleep apnea) 10/28/2012  . RLQ abdominal pain 06/20/2012  . Neuropathic pain 11/10/2011  . C. difficile colitis 11/08/2011  . Musculoskeletal pain 11/08/2011  . Bradycardia 11/08/2011  . PVC's (premature ventricular contractions) 11/07/2011  . Chest pain, atypical 11/07/2011  . Gastroenteritis 11/06/2011  . Heart palpitations 11/06/2011  . Morbid obesity 11/06/2011  . Anxiety and depression 11/06/2011  . HTN (hypertension) 11/06/2011  . Depression 11/06/2011    Past Surgical History  Procedure Laterality Date  . Tonsillectomy    . Cholecystectomy    . Cesarean section    . Nose surgery      Current Outpatient Rx  Name  Route  Sig  Dispense  Refill  . acetaminophen (TYLENOL) 500 MG tablet   Oral   Take 500 mg by mouth every 6 (six) hours as needed for mild pain.         Marland Kitchen albuterol (PROVENTIL HFA;VENTOLIN HFA) 108 (90 BASE) MCG/ACT inhaler   Inhalation   Inhale 2 puffs into the lungs every 4 (four) hours as needed for wheezing or shortness of breath.   1 Inhaler   0   . buPROPion (WELLBUTRIN XL) 300 MG 24 hr tablet   Oral   Take 300 mg by mouth daily.          . busPIRone (BUSPAR) 15 MG tablet   Oral   Take 2 tablets (30 mg  total) by mouth 2 (two) times daily.   120 tablet   1   . escitalopram (LEXAPRO) 20 MG tablet   Oral   Take 20 mg by mouth daily.           . hydrochlorothiazide (HYDRODIURIL) 25 MG tablet   Oral   Take 25 mg by mouth at bedtime.          Marland Kitchen lisinopril (PRINIVIL,ZESTRIL) 10 MG tablet   Oral   Take 5 mg by mouth daily.          Marland Kitchen LORazepam (ATIVAN) 2 MG tablet   Oral   Take 1 tablet (2 mg total) by mouth 3 (three) times daily.   15 tablet   0   . oxyCODONE-acetaminophen (PERCOCET) 5-325 MG per tablet   Oral   Take 2 tablets by mouth every 6 (six) hours as needed.   15 tablet   0   . warfarin (COUMADIN) 5 MG tablet   Oral   Take 7.5-10 mg by mouth daily at 6 PM.  Patient take 7.5 mg for Mon Wed Fri AND 10 mg all other days           Allergies Hydrocodone; Nitroglycerin; Tramadol; Dilaudid; Levaquin; Toradol; Trazodone and nefazodone; and Sulfa antibiotics  Family History  Problem Relation Age of Onset  . Stroke Other   . Cancer Other   . Diabetes Other     Social History Social History  Substance Use Topics  . Smoking status: Former Research scientist (life sciences)  . Smokeless tobacco: Never Used  . Alcohol Use: No    Review of Systems  Constitutional: Negative for fever.   Genitourinary: Negative for dysuria. No difficulty urinating Musculoskeletal: Positive for back pain Skin: Negative for rash. Neurological: Negative for headaches or focal weakness   ____________________________________________   PHYSICAL EXAM:  VITAL SIGNS: ED Triage Vitals  Enc Vitals Group     BP 03/23/15 0923 131/55 mmHg     Pulse Rate 03/23/15 0923 72     Resp 03/23/15 0923 16     Temp 03/23/15 0923 97.8 F (36.6 C)     Temp Source 03/23/15 0923 Oral     SpO2 03/23/15 0923 97 %     Weight 03/23/15 0928 384 lb 5 oz (174.323 kg)     Height 03/23/15 0923 5\' 6"  (1.676 m)     Head Cir --      Peak Flow --      Pain Score 03/23/15 0924 10     Pain Loc --      Pain Edu? --      Excl. in [place]? --      Constitutional: Alert and oriented. Chronically ill-appearing and tearful Eyes: Conjunctivae are normal.  ENT   Head: Normocephalic and atraumatic.   Mouth/Throat: Mucous membranes are moist. Cardiovascular: Normal rate, regular rhythm.  Respiratory: Normal respiratory effort without tachypnea nor retractions.  Gastrointestinal: Soft and non-tender in all quadrants. No distention. There is no CVA tenderness. Musculoskeletal: Nontender with normal range of motion in all extremities. Lower extremity patellar reflexes are normal. Strength is normal in both lower extremities. No vertebral tenderness to palpation. She has tenderness in the low back on the right  paraspinally consistent with a muscle spasm Neurologic:  Normal speech and language. No gross focal neurologic deficits are appreciated. Skin:  Skin is warm, dry and intact. No rash noted. Psychiatric: Patient is anxious and tearful  ____________________________________________    LABS (pertinent positives/negatives)  Labs Reviewed - No  data to display  ____________________________________________     ____________________________________________    RADIOLOGY I have personally reviewed any xrays that were ordered on this patient: None  ____________________________________________   PROCEDURES  Procedure(s) performed: none   ____________________________________________   INITIAL IMPRESSION / ASSESSMENT AND PLAN / ED COURSE  Pertinent labs & imaging results that were available during my care of the patient were reviewed by me and considered in my medical decision making (see chart for details).  This is a difficult situation given her pain management situation and her allergies therapeutic options are extremely limited. Neuro exam has not revealed any concern for neuro deficits given her Coumadin use I checked this carefully. Her exam is most consistent with paraspinal muscle spasm. She is allergic to Toradol and cannot have narcotics. We will give a very small dose of Valium for 2 days because she says this is worked for her back pain in the past. She agrees to not take her Ativan or oxycodone while taking Valium.  ____________________________________________   FINAL CLINICAL IMPRESSION(S) / ED DIAGNOSES  Final diagnoses:  Strain of lumbar paraspinal muscle, initial encounter     Lavonia Drafts, MD 03/23/15 1020

## 2015-03-25 ENCOUNTER — Emergency Department (HOSPITAL_COMMUNITY)
Admission: EM | Admit: 2015-03-25 | Discharge: 2015-03-25 | Disposition: A | Discharge status: Home / Self Care | Payer: Medicaid Other | Attending: Emergency Medicine | Admitting: Emergency Medicine

## 2015-03-25 ENCOUNTER — Encounter (HOSPITAL_COMMUNITY): Payer: Self-pay | Admitting: Emergency Medicine

## 2015-03-25 ENCOUNTER — Emergency Department (HOSPITAL_COMMUNITY): Payer: Medicaid Other

## 2015-03-25 DIAGNOSIS — I10 Essential (primary) hypertension: Secondary | ICD-10-CM | POA: Insufficient documentation

## 2015-03-25 DIAGNOSIS — Z87828 Personal history of other (healed) physical injury and trauma: Secondary | ICD-10-CM | POA: Insufficient documentation

## 2015-03-25 DIAGNOSIS — Z87891 Personal history of nicotine dependence: Secondary | ICD-10-CM | POA: Diagnosis not present

## 2015-03-25 DIAGNOSIS — Z8619 Personal history of other infectious and parasitic diseases: Secondary | ICD-10-CM | POA: Insufficient documentation

## 2015-03-25 DIAGNOSIS — J45909 Unspecified asthma, uncomplicated: Secondary | ICD-10-CM | POA: Diagnosis not present

## 2015-03-25 DIAGNOSIS — Z79899 Other long term (current) drug therapy: Secondary | ICD-10-CM | POA: Insufficient documentation

## 2015-03-25 DIAGNOSIS — M5441 Lumbago with sciatica, right side: Secondary | ICD-10-CM | POA: Diagnosis not present

## 2015-03-25 DIAGNOSIS — Z86718 Personal history of other venous thrombosis and embolism: Secondary | ICD-10-CM | POA: Diagnosis not present

## 2015-03-25 DIAGNOSIS — M1990 Unspecified osteoarthritis, unspecified site: Secondary | ICD-10-CM | POA: Diagnosis not present

## 2015-03-25 DIAGNOSIS — F329 Major depressive disorder, single episode, unspecified: Secondary | ICD-10-CM | POA: Diagnosis not present

## 2015-03-25 DIAGNOSIS — Z8669 Personal history of other diseases of the nervous system and sense organs: Secondary | ICD-10-CM | POA: Insufficient documentation

## 2015-03-25 DIAGNOSIS — M549 Dorsalgia, unspecified: Secondary | ICD-10-CM | POA: Diagnosis present

## 2015-03-25 DIAGNOSIS — E119 Type 2 diabetes mellitus without complications: Secondary | ICD-10-CM | POA: Insufficient documentation

## 2015-03-25 DIAGNOSIS — Z7901 Long term (current) use of anticoagulants: Secondary | ICD-10-CM | POA: Diagnosis not present

## 2015-03-25 LAB — URINALYSIS, ROUTINE W REFLEX MICROSCOPIC
Bilirubin Urine: NEGATIVE
Glucose, UA: NEGATIVE mg/dL
Hgb urine dipstick: NEGATIVE
KETONES UR: NEGATIVE mg/dL
NITRITE: NEGATIVE
PROTEIN: NEGATIVE mg/dL
Specific Gravity, Urine: 1.026 (ref 1.005–1.030)
Urobilinogen, UA: 0.2 mg/dL (ref 0.0–1.0)
pH: 7.5 (ref 5.0–8.0)

## 2015-03-25 LAB — URINE MICROSCOPIC-ADD ON

## 2015-03-25 LAB — PROTIME-INR
INR: 1.84 — AB (ref 0.00–1.49)
Prothrombin Time: 21.2 seconds — ABNORMAL HIGH (ref 11.6–15.2)

## 2015-03-25 MED ORDER — PREDNISONE 20 MG PO TABS
60.0000 mg | ORAL_TABLET | Freq: Once | ORAL | Status: AC
  Administered 2015-03-25: 60 mg via ORAL
  Filled 2015-03-25: qty 3

## 2015-03-25 MED ORDER — PREDNISONE 50 MG PO TABS: ORAL_TABLET | ORAL | Status: DC
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
## 2015-03-25 NOTE — ED Notes (Signed)
Bed: WTR6 Expected date:  Expected time:  Means of arrival:  Comments: EMS- 54yo, fall/back pain

## 2015-03-25 NOTE — ED Notes (Signed)
Patient transported to X-ray 
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
## 2015-03-25 NOTE — Discharge Instructions (Signed)
Prescription for prednisone. X-ray shows no fracture. Follow-up your Dr.
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
## 2015-03-25 NOTE — ED Notes (Signed)
Pt reported to PA that she is having bladder incontinence and R leg numbness.

## 2015-06-01 ENCOUNTER — Encounter: Payer: Self-pay | Admitting: Emergency Medicine

## 2015-06-01 ENCOUNTER — Emergency Department
Admission: EM | Admit: 2015-06-01 | Discharge: 2015-06-01 | Disposition: A | Discharge status: Home / Self Care | Payer: Medicaid Other | Attending: Emergency Medicine | Admitting: Emergency Medicine

## 2015-06-01 DIAGNOSIS — Z7901 Long term (current) use of anticoagulants: Secondary | ICD-10-CM

## 2015-06-01 DIAGNOSIS — Z791 Long term (current) use of non-steroidal anti-inflammatories (NSAID): Secondary | ICD-10-CM | POA: Diagnosis not present

## 2015-06-01 DIAGNOSIS — Z79899 Other long term (current) drug therapy: Secondary | ICD-10-CM | POA: Diagnosis not present

## 2015-06-01 DIAGNOSIS — L0390 Cellulitis, unspecified: Secondary | ICD-10-CM

## 2015-06-01 DIAGNOSIS — I10 Essential (primary) hypertension: Secondary | ICD-10-CM | POA: Diagnosis not present

## 2015-06-01 DIAGNOSIS — Y998 Other external cause status: Secondary | ICD-10-CM | POA: Diagnosis not present

## 2015-06-01 DIAGNOSIS — L03115 Cellulitis of right lower limb: Secondary | ICD-10-CM | POA: Diagnosis not present

## 2015-06-01 DIAGNOSIS — S80812A Abrasion, left lower leg, initial encounter: Secondary | ICD-10-CM | POA: Insufficient documentation

## 2015-06-01 DIAGNOSIS — S80811A Abrasion, right lower leg, initial encounter: Secondary | ICD-10-CM | POA: Diagnosis not present

## 2015-06-01 DIAGNOSIS — L0291 Cutaneous abscess, unspecified: Secondary | ICD-10-CM

## 2015-06-01 DIAGNOSIS — X58XXXA Exposure to other specified factors, initial encounter: Secondary | ICD-10-CM | POA: Insufficient documentation

## 2015-06-01 DIAGNOSIS — Z7952 Long term (current) use of systemic steroids: Secondary | ICD-10-CM | POA: Diagnosis not present

## 2015-06-01 DIAGNOSIS — M5417 Radiculopathy, lumbosacral region: Secondary | ICD-10-CM | POA: Diagnosis not present

## 2015-06-01 DIAGNOSIS — E119 Type 2 diabetes mellitus without complications: Secondary | ICD-10-CM | POA: Insufficient documentation

## 2015-06-01 DIAGNOSIS — Y9289 Other specified places as the place of occurrence of the external cause: Secondary | ICD-10-CM | POA: Diagnosis not present

## 2015-06-01 DIAGNOSIS — Y9389 Activity, other specified: Secondary | ICD-10-CM | POA: Insufficient documentation

## 2015-06-01 DIAGNOSIS — M545 Low back pain: Secondary | ICD-10-CM | POA: Diagnosis present

## 2015-06-01 DIAGNOSIS — Z87891 Personal history of nicotine dependence: Secondary | ICD-10-CM | POA: Insufficient documentation

## 2015-06-01 DIAGNOSIS — R791 Abnormal coagulation profile: Secondary | ICD-10-CM | POA: Insufficient documentation

## 2015-06-01 DIAGNOSIS — Z5181 Encounter for therapeutic drug level monitoring: Secondary | ICD-10-CM

## 2015-06-01 LAB — PROTIME-INR
INR: 1.83
Prothrombin Time: 21.3 seconds — ABNORMAL HIGH (ref 11.4–15.0)

## 2015-06-01 MED ORDER — DOXYCYCLINE HYCLATE 100 MG PO TABS
100.0000 mg | ORAL_TABLET | Freq: Two times a day (BID) | ORAL | Status: DC
  Administered 2015-06-01: 100 mg via ORAL
  Filled 2015-06-01: qty 1

## 2015-06-01 MED ORDER — DOXYCYCLINE HYCLATE 100 MG PO CAPS: 100.0000 mg | ORAL_CAPSULE | Freq: Two times a day (BID) | ORAL | Status: DC

## 2015-06-01 MED ORDER — OXYCODONE-ACETAMINOPHEN 5-325 MG PO TABS: 1.0000 | ORAL_TABLET | Freq: Four times a day (QID) | ORAL | Status: DC | PRN

## 2015-06-01 MED ORDER — OXYCODONE HCL 5 MG PO TABS
10.0000 mg | ORAL_TABLET | Freq: Once | ORAL | Status: DC
  Filled 2015-06-01: qty 2

## 2015-06-01 NOTE — ED Notes (Signed)
Patient with no complaints at this time. Respirations even and unlabored. Skin warm/dry. Discharge instructions reviewed with patient at this time. Patient given opportunity to voice concerns/ask questions. Patient discharged at this time and left Emergency Department, via wheelchair.   

## 2015-06-01 NOTE — ED Notes (Signed)
Pain to upper part of right leg and lower back x 2 days.  Also left arm pain and swelling intermittently x 4 days.

## 2015-06-01 NOTE — Discharge Instructions (Signed)

## 2015-06-01 NOTE — ED Notes (Signed)
Pt reports chronic pain in in right hip radiating down leg at a 8 out of 10, related to blood clot in approximately 2 years ago.   Her pain in the hip has been worsening the last two days.  Pt reports a hardened nodule on the interior of her left ear - I was not able to feel this on palpation. Pt also reports 'bite' on lateral aspect of left forarm X 4 days causing 10 out of 10 pain.  The area is raised, reddened, with a dark center, and open pinpoint wound; there is light swelling to the area.

## 2015-06-01 NOTE — ED Provider Notes (Signed)
[facility] Emergency Department Provider Note  ____________________________________________  Time seen: Approximately 9:30 PM  I have reviewed the triage vital signs and the nursing notes.   HISTORY  Chief Complaint Back Pain and Abscess    HPI Stacey [name] is a 54 y.o. female who presents to the emergency department for evaluation of right hip pain with radiation into the right lower extremity. She reports she has had this similar symptoms in the past but it's a little different tonight since it is radiating down the side of her leg. She also complains of a nodule that she can feel in her left earlobe and an abscess that has been present on her left forearm for the past 4 days. She has not taken anything for pain.   Past Medical History  Diagnosis Date  . Hypertension   . Depression   . Type 2 diabetes mellitus ([place])   . Diverticulitis   . Asthma   . Cyst     pt has "cyst" to legs for years, areas drain at time  . Pancreatitis   . Arthritis   . C. difficile colitis 11/08/2011  . Musculoskeletal pain 11/08/2011  . Neuropathic pain 11/10/2011  . Sleep apnea with use of continuous positive airway pressure (CPAP)     uses CPAP at night  . 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 DVT (deep venous thrombosis) [facility])     Patient Active Problem List   Diagnosis Date Noted  . Chest pain 08/14/2013  . Atypical chest pain 08/14/2013  . Localized swelling, mass, or lump of right lower extremity 08/14/2013  . Pulmonary embolism 07/16/2013  . Short of breath on exertion 07/16/2013  . Cellulitis of leg, right 06/03/2013  . Anemia 02/18/2013  . Diabetes mellitus type II, controlled ([place]) 02/17/2013  . 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 Leukocytosis 02/07/2013  . LLQ abdominal pain 10/28/2012  . UTI (lower urinary tract infection) 10/28/2012  . Dyspnea 10/28/2012  . OSA (obstructive sleep apnea)  10/28/2012  . RLQ abdominal pain 06/20/2012  . Neuropathic pain 11/10/2011  . C. difficile colitis 11/08/2011  . Musculoskeletal pain 11/08/2011  . Bradycardia 11/08/2011  . PVC's (premature ventricular contractions) 11/07/2011  . Chest pain, atypical 11/07/2011  . Gastroenteritis 11/06/2011  . Heart palpitations 11/06/2011  . Morbid obesity ([place]) 11/06/2011  . Anxiety and depression 11/06/2011  . HTN (hypertension) 11/06/2011  . Depression 11/06/2011    Past Surgical History  Procedure Laterality Date  . Tonsillectomy    . Cholecystectomy    . Cesarean section    . Nose surgery      Current Outpatient Rx  Name  Route  Sig  Dispense  Refill  . albuterol (PROVENTIL HFA;VENTOLIN HFA) 108 (90 BASE) MCG/ACT inhaler   Inhalation   Inhale 2 puffs into the lungs every 4 (four) hours as needed for wheezing or shortness of breath.   1 Inhaler   0   . buPROPion (WELLBUTRIN XL) 300 MG 24 hr tablet   Oral   Take 300 mg by mouth daily.          . busPIRone (BUSPAR) 15 MG tablet   Oral   Take 2 tablets (30 mg total) by mouth 2 (two) times daily.   120 tablet   1   . diazepam (VALIUM) 5 MG tablet   Oral   Take 1 tablet (5 mg total) by mouth every 8 (eight)  hours as needed for anxiety. Patient taking differently: Take 5 mg by mouth every 8 (eight) hours as needed for anxiety or muscle spasms.    12 tablet   0   . doxycycline (VIBRAMYCIN) 100 MG capsule   Oral   Take 1 capsule (100 mg total) by mouth 2 (two) times daily.   20 capsule   0   . escitalopram (LEXAPRO) 20 MG tablet   Oral   Take 20 mg by mouth daily.           . hydrochlorothiazide (HYDRODIURIL) 25 MG tablet   Oral   Take 25 mg by mouth at bedtime.          Marland Kitchen lisinopril (PRINIVIL,ZESTRIL) 10 MG tablet   Oral   Take 10 mg by mouth daily.          Marland Kitchen LORazepam (ATIVAN) 2 MG tablet   Oral   Take 1 tablet (2 mg total) by mouth 3 (three) times daily. Patient taking differently: Take 2 mg by mouth 3  (three) times daily as needed for anxiety or sleep.    15 tablet   0   . nystatin cream (MYCOSTATIN)   Topical   Apply 1 application topically 2 (two) times daily as needed for dry skin.       3   . oxyCODONE-acetaminophen (ROXICET) 5-325 MG tablet   Oral   Take 1 tablet by mouth every 6 (six) hours as needed.   9 tablet   0   . predniSONE (DELTASONE) 50 MG tablet      1 tablet for 6 days, one half tablet for 6 days   9 tablet   0   . warfarin (COUMADIN) 5 MG tablet   Oral   Take 7.5-10 mg by mouth daily at 6 PM. Patient take 7.5 mg for Mon Wed Fri AND 10 mg all other days           Allergies Hydrocodone; Nitroglycerin; Tramadol; Dilaudid; Levaquin; Toradol; Trazodone and nefazodone; and Sulfa antibiotics  Family History  Problem Relation Age of Onset  . Stroke Other   . Cancer Other   . Diabetes Other     Social History Social History  Substance Use Topics  . Smoking status: Former Research scientist (life sciences)  . Smokeless tobacco: Never Used  . Alcohol Use: No    Review of Systems Constitutional: No fever/chills Eyes: No visual changes. ENT: No sore throat. Cardiovascular: Denies chest pain. Respiratory: Denies shortness of breath. Gastrointestinal: No abdominal pain.  No nausea, no vomiting.  No diarrhea.  No constipation. Genitourinary: Negative for dysuria. Musculoskeletal: Positive for back pain. Skin: Negative for rash. Neurological: Negative for headaches, focal weakness or numbness.  10-point ROS otherwise negative.  ____________________________________________   PHYSICAL EXAM:  VITAL SIGNS: ED Triage Vitals  Enc Vitals Group     BP 06/01/15 2038 147/47 mmHg     Pulse Rate 06/01/15 2038 83     Resp 06/01/15 2038 16     Temp 06/01/15 2038 98.7 F (37.1 C)     Temp Source 06/01/15 2038 Oral     SpO2 06/01/15 2038 98 %     Weight 06/01/15 2038 380 lb (172.367 kg)     Height 06/01/15 2038 5\' 6"  (1.676 m)     Head Cir --      Peak Flow --      Pain Score  06/01/15 2041 9     Pain Loc --      Pain Edu? --  Excl. in [place]? --     Constitutional: Alert and oriented. Well appearing and in no acute distress. Eyes: Conjunctivae are normal. PERRL. EOMI. Head: Atraumatic. Nose: No congestion/rhinnorhea. Mouth/Throat: Mucous membranes are moist.  Oropharynx non-erythematous. Neck: No stridor.   Cardiovascular: Normal rate, regular rhythm. Grossly normal heart sounds.  Good peripheral circulation. Respiratory: Normal respiratory effort.  No retractions. Lungs CTAB. Gastrointestinal: Soft and nontender. No distention. No abdominal bruits. No CVA tenderness. Musculoskeletal: No lower extremity tenderness nor edema.  No joint effusions. Neurologic:  Normal speech and language. No gross focal neurologic deficits are appreciated. No gait instability. Skin:  Skin is warm, dry and intact. No rash noted. Scattered abrasions noted over bilateral lower extremities. No pitting edema noted. Draining, indurated, non fluctuant erythematous, raised area on left lateral forearm. Palpable, mobile nodule noted in lobe of left ear. Psychiatric: Mood and affect are normal. Speech and behavior are normal.  ____________________________________________   LABS (all labs ordered are listed, but only abnormal results are displayed)  Labs Reviewed  PROTIME-INR - Abnormal; Notable for the following:    Prothrombin Time 21.3 (*)    All other components within normal limits   ____________________________________________  EKG   ____________________________________________  RADIOLOGY  Not indicated. ____________________________________________   PROCEDURES  Procedure(s) performed: None  Critical Care performed: No  ____________________________________________   INITIAL IMPRESSION / ASSESSMENT AND PLAN / ED COURSE  Pertinent labs & imaging results that were available during my care of the patient were reviewed by me and considered in my medical decision  making (see chart for details).  Patient was advised that her INR is subtherapeutic at 1.83. She was instructed to call the vein and vascular center tomorrow. She was also advised to tell them that she will be taking doxycycline. She was strongly advised to keep her blood sugar well controlled. She was advised to see her primary care or return to the emergency department for an increase in pain or swelling of the forearm. She was instructed to take the antibiotic until finished. ____________________________________________   FINAL CLINICAL IMPRESSION(S) / ED DIAGNOSES  Final diagnoses:  Radicular pain of lumbosacral region  Cellulitis and abscess  Subtherapeutic anticoagulation      Victorino Dike, FNP 06/01/15 [id number]  Nance Pear, MD 06/01/15 2350

## 2015-06-01 NOTE — ED Notes (Signed)
Right forearm with 6 in x 4 in  reddened area with darker red circular center seen.  Patient states a "friend" expressed small amounts of "white drainage yesterday" and today from center, but mostly drainage was "bloody".

## 2015-07-05 ENCOUNTER — Encounter: Payer: Self-pay | Admitting: Emergency Medicine

## 2015-07-05 ENCOUNTER — Emergency Department
Admission: EM | Admit: 2015-07-05 | Discharge: 2015-07-05 | Disposition: A | Discharge status: Home / Self Care | Payer: Medicaid Other | Attending: Emergency Medicine | Admitting: Emergency Medicine

## 2015-07-05 DIAGNOSIS — Z87891 Personal history of nicotine dependence: Secondary | ICD-10-CM | POA: Diagnosis not present

## 2015-07-05 DIAGNOSIS — E119 Type 2 diabetes mellitus without complications: Secondary | ICD-10-CM | POA: Diagnosis not present

## 2015-07-05 DIAGNOSIS — Z79899 Other long term (current) drug therapy: Secondary | ICD-10-CM | POA: Diagnosis not present

## 2015-07-05 DIAGNOSIS — Z7901 Long term (current) use of anticoagulants: Secondary | ICD-10-CM | POA: Diagnosis not present

## 2015-07-05 DIAGNOSIS — Z792 Long term (current) use of antibiotics: Secondary | ICD-10-CM | POA: Insufficient documentation

## 2015-07-05 DIAGNOSIS — H6501 Acute serous otitis media, right ear: Secondary | ICD-10-CM | POA: Diagnosis not present

## 2015-07-05 DIAGNOSIS — I10 Essential (primary) hypertension: Secondary | ICD-10-CM | POA: Insufficient documentation

## 2015-07-05 DIAGNOSIS — H9201 Otalgia, right ear: Secondary | ICD-10-CM | POA: Diagnosis present

## 2015-07-05 MED ORDER — CETIRIZINE HCL 10 MG PO CAPS: 10.0000 mg | ORAL_CAPSULE | Freq: Every day | ORAL | Status: DC

## 2015-07-05 NOTE — ED Notes (Addendum)
Wrong pt

## 2015-07-05 NOTE — ED Notes (Signed)
States she is having pain to right ear  Describes as "chewing" in ear

## 2015-07-05 NOTE — Discharge Instructions (Signed)

## 2015-07-05 NOTE — ED Provider Notes (Signed)
[facility] Emergency Department Provider Note ____________________________________________  Time seen: Approximately 8:26 AM  I have reviewed the triage vital signs and the nursing notes.   HISTORY  Chief Complaint Otalgia   HPI Stacey [name] is a 54 y.o. female who presents to the emergency department for evaluation of right ear pain that started yesterday. She states she heard something "chewing" in that ear all day yesterday. No chewing sound this morning, but the ear feels irritated.    Past Medical History  Diagnosis Date  . Hypertension   . Depression   . Type 2 diabetes mellitus ([place])   . Diverticulitis   . Asthma   . Cyst     pt has "cyst" to legs for years, areas drain at time  . Pancreatitis   . Arthritis   . C. difficile colitis 11/08/2011  . Musculoskeletal pain 11/08/2011  . Neuropathic pain 11/10/2011  . Sleep apnea with use of continuous positive airway pressure (CPAP)     uses CPAP at night  . 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 DVT (deep venous thrombosis) [facility])     Patient Active Problem List   Diagnosis Date Noted  . Chest pain 08/14/2013  . Atypical chest pain 08/14/2013  . Localized swelling, mass, or lump of right lower extremity 08/14/2013  . Pulmonary embolism 07/16/2013  . Short of breath on exertion 07/16/2013  . Cellulitis of leg, right 06/03/2013  . Anemia 02/18/2013  . Diabetes mellitus type II, controlled ([place]) 02/17/2013  . 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 Leukocytosis 02/07/2013  . LLQ abdominal pain 10/28/2012  . UTI (lower urinary tract infection) 10/28/2012  . Dyspnea 10/28/2012  . OSA (obstructive sleep apnea) 10/28/2012  . RLQ abdominal pain 06/20/2012  . Neuropathic pain 11/10/2011  . C. difficile colitis 11/08/2011  . Musculoskeletal pain 11/08/2011  . Bradycardia 11/08/2011  . PVC's (premature ventricular contractions) 11/07/2011   . Chest pain, atypical 11/07/2011  . Gastroenteritis 11/06/2011  . Heart palpitations 11/06/2011  . Morbid obesity ([place]) 11/06/2011  . Anxiety and depression 11/06/2011  . HTN (hypertension) 11/06/2011  . Depression 11/06/2011    Past Surgical History  Procedure Laterality Date  . Tonsillectomy    . Cholecystectomy    . Cesarean section    . Nose surgery      Current Outpatient Rx  Name  Route  Sig  Dispense  Refill  . albuterol (PROVENTIL HFA;VENTOLIN HFA) 108 (90 BASE) MCG/ACT inhaler   Inhalation   Inhale 2 puffs into the lungs every 4 (four) hours as needed for wheezing or shortness of breath.   1 Inhaler   0   . buPROPion (WELLBUTRIN XL) 300 MG 24 hr tablet   Oral   Take 300 mg by mouth daily.          . busPIRone (BUSPAR) 15 MG tablet   Oral   Take 2 tablets (30 mg total) by mouth 2 (two) times daily.   120 tablet   1   . Cetirizine HCl 10 MG CAPS   Oral   Take 1 capsule (10 mg total) by mouth daily.   30 capsule   0   . diazepam (VALIUM) 5 MG tablet   Oral   Take 1 tablet (5 mg total) by mouth every 8 (eight) hours as needed for anxiety. Patient taking differently: Take 5 mg by mouth every 8 (eight) hours as needed for anxiety  or muscle spasms.    12 tablet   0   . doxycycline (VIBRAMYCIN) 100 MG capsule   Oral   Take 1 capsule (100 mg total) by mouth 2 (two) times daily.   20 capsule   0   . escitalopram (LEXAPRO) 20 MG tablet   Oral   Take 20 mg by mouth daily.           . hydrochlorothiazide (HYDRODIURIL) 25 MG tablet   Oral   Take 25 mg by mouth at bedtime.          Marland Kitchen lisinopril (PRINIVIL,ZESTRIL) 10 MG tablet   Oral   Take 10 mg by mouth daily.          Marland Kitchen LORazepam (ATIVAN) 2 MG tablet   Oral   Take 1 tablet (2 mg total) by mouth 3 (three) times daily. Patient taking differently: Take 2 mg by mouth 3 (three) times daily as needed for anxiety or sleep.    15 tablet   0   . nystatin cream (MYCOSTATIN)   Topical   Apply 1  application topically 2 (two) times daily as needed for dry skin.       3   . oxyCODONE-acetaminophen (ROXICET) 5-325 MG tablet   Oral   Take 1 tablet by mouth every 6 (six) hours as needed.   9 tablet   0   . predniSONE (DELTASONE) 50 MG tablet      1 tablet for 6 days, one half tablet for 6 days   9 tablet   0   . warfarin (COUMADIN) 5 MG tablet   Oral   Take 7.5-10 mg by mouth daily at 6 PM. Patient take 7.5 mg for Mon Wed Fri AND 10 mg all other days           Allergies Hydrocodone; Nitroglycerin; Tramadol; Dilaudid; Levaquin; Toradol; Trazodone and nefazodone; and Sulfa antibiotics  Family History  Problem Relation Age of Onset  . Stroke Other   . Cancer Other   . Diabetes Other     Social History Social History  Substance Use Topics  . Smoking status: Former Research scientist (life sciences)  . Smokeless tobacco: Never Used  . Alcohol Use: No    Review of Systems Constitutional: No fever/chills Eyes: No visual changes. ENT: Earache:yes, right side; Discharge: no; Hearing Loss: yes, mild; Trauma: no; Sore throat: no;  Respiratory: No Cough or dyspnea Gastrointestinal: No abdominal pain.  No nausea, no vomiting.  No diarrhea.  No constipation. Musculoskeletal: Negative for pain. Skin: Negative for rash. Neurological: Negative for headaches, focal weakness or numbness.  10-point ROS otherwise negative.  ____________________________________________   PHYSICAL EXAM:  VITAL SIGNS: ED Triage Vitals  Enc Vitals Group     BP 07/05/15 0730 138/63 mmHg     Pulse Rate 07/05/15 0730 73     Resp 07/05/15 0730 18     Temp 07/05/15 0730 98.9 F (37.2 C)     Temp Source 07/05/15 0730 Oral     SpO2 07/05/15 0730 100 %     Weight 07/05/15 0730 380 lb (172.367 kg)     Height 07/05/15 0730 5\' 6"  (1.676 m)     Head Cir --      Peak Flow --      Pain Score 07/05/15 0730 0     Pain Loc --      Pain Edu? --      Excl. in [place]? --     Constitutional: Alert and oriented. Well  appearing  and in no acute distress. Eyes: Conjunctivae are normal. PERRL. EOMI. Ears: Pain with movement of auricle: no; External canal:normal ; TM's: serous fluid noted behind bilateral TM without erythema or bulging;   Head: Atraumatic. Nose: No congestion/rhinnorhea. Mouth/Throat: Mucous membranes are moist.  Oropharynx non-erythematous. Neck: No stridor.  Hematological/Lymphatic/Immunilogical: No cervical lymphadenopathy. Cardiovascular: Normal rate, regular rhythm.Good peripheral circulation. Respiratory: Normal respiratory effort.  No retractions.  Gastrointestinal: Soft and nontender. No distention. No abdominal bruits. No CVA tenderness. Musculoskeletal: Full ROM x 4. Neurologic:  Normal speech and language. No gross focal neurologic deficits are appreciated. Speech is normal. No gait instability. Skin:  Skin is warm, dry and intact. No rash noted. Psychiatric: Mood and affect are normal. Speech and behavior are normal.  ____________________________________________   LABS (all labs ordered are listed, but only abnormal results are displayed)  Labs Reviewed - No data to display ____________________________________________   RADIOLOGY  Not indicated. ____________________________________________   PROCEDURES  Procedure(s) performed: None  ____________________________________________   INITIAL IMPRESSION / ASSESSMENT AND PLAN / ED COURSE  Pertinent labs & imaging results that were available during my care of the patient were reviewed by me and considered in my medical decision making (see chart for details).  Patient was advised to follow up with the primary care provider or ENT doctor for symptoms that are not improving over the next 48 hours. Return to the ER for symptoms that change or worsen if you are unable to schedule an appointment. ____________________________________________   FINAL CLINICAL IMPRESSION(S) / ED DIAGNOSES  Final diagnoses:  Right acute serous  otitis media, recurrence not specified      Victorino Dike, FNP 07/05/15 [place], MD 07/05/15 [phone]

## 2015-08-14 ENCOUNTER — Emergency Department
Admission: EM | Admit: 2015-08-14 | Discharge: 2015-08-14 | Disposition: A | Discharge status: Home / Self Care | Payer: Medicaid Other | Attending: Emergency Medicine | Admitting: Emergency Medicine

## 2015-08-14 DIAGNOSIS — Z87891 Personal history of nicotine dependence: Secondary | ICD-10-CM | POA: Diagnosis not present

## 2015-08-14 DIAGNOSIS — Z7901 Long term (current) use of anticoagulants: Secondary | ICD-10-CM | POA: Diagnosis not present

## 2015-08-14 DIAGNOSIS — Z792 Long term (current) use of antibiotics: Secondary | ICD-10-CM | POA: Insufficient documentation

## 2015-08-14 DIAGNOSIS — Z79899 Other long term (current) drug therapy: Secondary | ICD-10-CM | POA: Diagnosis not present

## 2015-08-14 DIAGNOSIS — E119 Type 2 diabetes mellitus without complications: Secondary | ICD-10-CM | POA: Diagnosis not present

## 2015-08-14 DIAGNOSIS — Z7952 Long term (current) use of systemic steroids: Secondary | ICD-10-CM | POA: Insufficient documentation

## 2015-08-14 DIAGNOSIS — I10 Essential (primary) hypertension: Secondary | ICD-10-CM | POA: Insufficient documentation

## 2015-08-14 DIAGNOSIS — F419 Anxiety disorder, unspecified: Secondary | ICD-10-CM | POA: Diagnosis present

## 2015-08-14 MED ORDER — LORAZEPAM 0.5 MG PO TABS: 0.5000 mg | ORAL_TABLET | Freq: Three times a day (TID) | ORAL | Status: DC | PRN

## 2015-08-14 MED ORDER — LORAZEPAM 0.5 MG PO TABS
0.5000 mg | ORAL_TABLET | ORAL | Status: AC
  Administered 2015-08-14: 0.5 mg via ORAL
  Filled 2015-08-14: qty 1

## 2015-08-14 NOTE — ED Notes (Signed)
Use to see [facility] in [place], last visit 1-2 months, ran out of her refills lorazepam, has appointment [facility] in [place]. On jan 24,c/o anxiety, pt tearful,, missed md appointment due to [place] of state, so she has to change doctors

## 2015-08-14 NOTE — ED Provider Notes (Signed)
[facility] Emergency Department Provider Note  ____________________________________________  Time seen: Approximately 11:24 AM  I have reviewed the triage vital signs and the nursing notes.   HISTORY  Chief Complaint Anxiety    HPI GLENNDA [name] is a 55 y.o. female presents as she ran out of her lorazepam 3 days ago. Patient reports she's been taking lorazepam for a long time, had recently had her dose decreased from 2 mg 3 times a day to 1 mg 3 times a day. She missed her last appointment with her prescriber, and states her prescription ran out 3 days ago. Since then she has not had any significant symptoms other than feeling a little bit anxious. Denies tremors, confusion, seizures, nausea, vomiting, sweating or racing heart.  She is otherwise in her normal state of health. She denies feeling sick or ill. She has not had any headaches chest pain or trouble breathing. She does have a appointment upcoming with a new prescriber January 23.  She is not taking any other prescription medication such as prescription pain medication or other prescription anxiety medications like Valium at this time. She did not drive herself here.   Past Medical History  Diagnosis Date  . Hypertension   . Depression   . Type 2 diabetes mellitus ([place])   . Diverticulitis   . Asthma   . Cyst     pt has "cyst" to legs for years, areas drain at time  . Pancreatitis   . Arthritis   . C. difficile colitis 11/08/2011  . Musculoskeletal pain 11/08/2011  . Neuropathic pain 11/10/2011  . Sleep apnea with use of continuous positive airway pressure (CPAP)     uses CPAP at night  . 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 DVT (deep venous thrombosis) [facility])     Patient Active Problem List   Diagnosis Date Noted  . Chest pain 08/14/2013  . Atypical chest pain 08/14/2013  . Localized swelling, mass, or lump of right lower extremity 08/14/2013  . Pulmonary  embolism 07/16/2013  . Short of breath on exertion 07/16/2013  . Cellulitis of leg, right 06/03/2013  . Anemia 02/18/2013  . Diabetes mellitus type II, controlled ([place]) 02/17/2013  . 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 Leukocytosis 02/07/2013  . LLQ abdominal pain 10/28/2012  . UTI (lower urinary tract infection) 10/28/2012  . Dyspnea 10/28/2012  . OSA (obstructive sleep apnea) 10/28/2012  . RLQ abdominal pain 06/20/2012  . Neuropathic pain 11/10/2011  . C. difficile colitis 11/08/2011  . Musculoskeletal pain 11/08/2011  . Bradycardia 11/08/2011  . PVC's (premature ventricular contractions) 11/07/2011  . Chest pain, atypical 11/07/2011  . Gastroenteritis 11/06/2011  . Heart palpitations 11/06/2011  . Morbid obesity ([place]) 11/06/2011  . Anxiety and depression 11/06/2011  . HTN (hypertension) 11/06/2011  . Depression 11/06/2011    Past Surgical History  Procedure Laterality Date  . Tonsillectomy    . Cholecystectomy    . Cesarean section    . Nose surgery      Current Outpatient Rx  Name  Route  Sig  Dispense  Refill  . albuterol (PROVENTIL HFA;VENTOLIN HFA) 108 (90 BASE) MCG/ACT inhaler   Inhalation   Inhale 2 puffs into the lungs every 4 (four) hours as needed for wheezing or shortness of breath.   1 Inhaler   0   . buPROPion (WELLBUTRIN XL) 300 MG 24 hr tablet   Oral   Take 300  mg by mouth daily.          . busPIRone (BUSPAR) 15 MG tablet   Oral   Take 2 tablets (30 mg total) by mouth 2 (two) times daily.   120 tablet   1   . Cetirizine HCl 10 MG CAPS   Oral   Take 1 capsule (10 mg total) by mouth daily.   30 capsule   0   . doxycycline (VIBRAMYCIN) 100 MG capsule   Oral   Take 1 capsule (100 mg total) by mouth 2 (two) times daily.   20 capsule   0   . escitalopram (LEXAPRO) 20 MG tablet   Oral   Take 20 mg by mouth daily.           . hydrochlorothiazide (HYDRODIURIL) 25 MG tablet   Oral   Take 25 mg by mouth  at bedtime.          Marland Kitchen lisinopril (PRINIVIL,ZESTRIL) 10 MG tablet   Oral   Take 10 mg by mouth daily.          Marland Kitchen LORazepam (ATIVAN) 0.5 MG tablet   Oral   Take 1 tablet (0.5 mg total) by mouth every 8 (eight) hours as needed for anxiety.   15 tablet   0   . nystatin cream (MYCOSTATIN)   Topical   Apply 1 application topically 2 (two) times daily as needed for dry skin.       3   . predniSONE (DELTASONE) 50 MG tablet      1 tablet for 6 days, one half tablet for 6 days   9 tablet   0   . warfarin (COUMADIN) 5 MG tablet   Oral   Take 7.5-10 mg by mouth daily at 6 PM. Patient take 7.5 mg for Mon Wed Fri AND 10 mg all other days           Allergies Hydrocodone; Nitroglycerin; Tramadol; Dilaudid; Levaquin; Toradol; Trazodone and nefazodone; and Sulfa antibiotics  Family History  Problem Relation Age of Onset  . Stroke Other   . Cancer Other   . Diabetes Other     Social History Social History  Substance Use Topics  . Smoking status: Former Research scientist (life sciences)  . Smokeless tobacco: Never Used  . Alcohol Use: No    Review of Systems Constitutional: No fever/chills Eyes: No visual changes. ENT: No sore throat. Cardiovascular: Denies chest pain. Respiratory: Denies shortness of breath. Gastrointestinal: No abdominal pain.  No nausea, no vomiting.  No diarrhea.  No constipation. Genitourinary: Negative for dysuria. Musculoskeletal: Negative for back pain. Skin: Negative for rash. Neurological: Negative for headaches, focal weakness or numbness.  No hallucinations. No thoughts of wanting to harm herself or others. She does report just feeling mildly anxious, more so than usual since she ran out of her lorazepam.  10-point ROS otherwise negative.  ____________________________________________   PHYSICAL EXAM:  VITAL SIGNS: ED Triage Vitals  Enc Vitals Group     BP 08/14/15 1038 132/69 mmHg     Pulse Rate 08/14/15 1038 66     Resp 08/14/15 1038 20     Temp  08/14/15 1038 97.8 F (36.6 C)     Temp Source 08/14/15 1038 Oral     SpO2 08/14/15 1038 100 %     Weight 08/14/15 1038 402 lb 11.2 oz (182.664 kg)     Height 08/14/15 1038 5\' 6"  (1.676 m)     Head Cir --      Peak  Flow --      Pain Score 08/14/15 1040 10     Pain Loc --      Pain Edu? --      Excl. in [place]? --    Constitutional: Alert and oriented. Well appearing and in no acute distress. Seated. Amicable. She is notably quite overweight. Eyes: Conjunctivae are normal. PERRL. EOMI. Head: Atraumatic. Nose: No congestion/rhinnorhea. Mouth/Throat: Mucous membranes are moist.  Oropharynx non-erythematous. Neck: No stridor.   Cardiovascular: Normal rate, regular rhythm. Grossly normal heart sounds.  Good peripheral circulation. Respiratory: Normal respiratory effort.  No retractions. Lungs CTAB. Gastrointestinal: Soft and nontender. No distention. No abdominal bruits. No CVA tenderness. Musculoskeletal: No lower extremity tenderness does have 1+ lower extremity edema bilaterally which patient reports is chronic.  No joint effusions. Neurologic:  Normal speech and language. No gross focal neurologic deficits are appreciated. No tremor. No psychomotor agitation. No evidence of hallucinations. She is clear and her thought processes. Normal reflexes.  Skin:  Skin is warm, dry and intact. No rash noted. Psychiatric: Mood and affect are normal. Speech and behavior are normal.  ____________________________________________   LABS (all labs ordered are listed, but only abnormal results are displayed)  Labs Reviewed - No data to display ____________________________________________  EKG   ____________________________________________  RADIOLOGY   ____________________________________________   PROCEDURES  Procedure(s) performed: None  Critical Care performed: No  ____________________________________________   INITIAL IMPRESSION / ASSESSMENT AND PLAN / ED COURSE  Pertinent labs &  imaging results that were available during my care of the patient were reviewed by me and considered in my medical decision making (see chart for details).  Discussed with the patient that I cannot continue her on her prescription Ativan at her present 1 mg dosing until she sees her doctor, but I can give her a prescription which she can take on an as-needed basis (up to once every 8 hours) if she develops symptoms such as worsening anxiety, mild tremor, nausea or other concerning symptoms.  Patient reports that she is agreeable, we will give her reduced dose of lorazepam. Patient was given careful discharge instructions, as well as careful return precautions in the event she does start to develop any moderate to severe withdrawal symptoms. She does not have a history of complicated withdrawal.  At the present time no evidence of active withdrawal. Vital signs normal.  Return precautions and treatment recommendations and follow-up discussed with the patient who is agreeable with the plan.  ____________________________________________   FINAL CLINICAL IMPRESSION(S) / ED DIAGNOSES  Final diagnoses:  Chronic prescription benzodiazepine use      Delman Kitten, MD 08/14/15 1142

## 2015-08-14 NOTE — Discharge Instructions (Signed)
Please call your doctor to set up close follow-up.  You are given a brief prescription for lorazepam, take up to 1 tablet (0.5 mg) every 8 hours as needed if you experience symptoms of withdrawal such as tremor, anxiety.  Return to the emergency room right away if you develop heavy sweating, confusion, weakness, vomiting, have a seizure, have uncontrollable shaking, or other new concerns arise.  Do not drive while taking lorazepam.

## 2015-08-14 NOTE — ED Notes (Signed)
Pt states she ran out of her lorazepam 1mg  TID, 2-3 days ago and does not have another refill, states she has to see a new MD 08/24/15.Catalina Gravel she has increased anxiety and panic attacks.Marland Kitchen

## 2015-08-20 ENCOUNTER — Emergency Department
Admission: EM | Admit: 2015-08-20 | Discharge: 2015-08-20 | Disposition: A | Discharge status: Home / Self Care | Payer: Medicaid Other | Attending: Emergency Medicine | Admitting: Emergency Medicine

## 2015-08-20 ENCOUNTER — Encounter: Payer: Self-pay | Admitting: Emergency Medicine

## 2015-08-20 DIAGNOSIS — Z7952 Long term (current) use of systemic steroids: Secondary | ICD-10-CM | POA: Diagnosis not present

## 2015-08-20 DIAGNOSIS — Z87891 Personal history of nicotine dependence: Secondary | ICD-10-CM | POA: Insufficient documentation

## 2015-08-20 DIAGNOSIS — I10 Essential (primary) hypertension: Secondary | ICD-10-CM | POA: Insufficient documentation

## 2015-08-20 DIAGNOSIS — F1320 Sedative, hypnotic or anxiolytic dependence, uncomplicated: Secondary | ICD-10-CM | POA: Insufficient documentation

## 2015-08-20 DIAGNOSIS — F419 Anxiety disorder, unspecified: Secondary | ICD-10-CM | POA: Insufficient documentation

## 2015-08-20 DIAGNOSIS — Z79899 Other long term (current) drug therapy: Secondary | ICD-10-CM | POA: Diagnosis not present

## 2015-08-20 DIAGNOSIS — R251 Tremor, unspecified: Secondary | ICD-10-CM | POA: Diagnosis present

## 2015-08-20 DIAGNOSIS — Z792 Long term (current) use of antibiotics: Secondary | ICD-10-CM | POA: Diagnosis not present

## 2015-08-20 DIAGNOSIS — J45901 Unspecified asthma with (acute) exacerbation: Secondary | ICD-10-CM | POA: Insufficient documentation

## 2015-08-20 DIAGNOSIS — E119 Type 2 diabetes mellitus without complications: Secondary | ICD-10-CM | POA: Insufficient documentation

## 2015-08-20 LAB — URINALYSIS COMPLETE WITH MICROSCOPIC (ARMC ONLY)
Bilirubin Urine: NEGATIVE
Glucose, UA: NEGATIVE mg/dL
Hgb urine dipstick: NEGATIVE
Ketones, ur: NEGATIVE mg/dL
Leukocytes, UA: NEGATIVE
Nitrite: NEGATIVE
PROTEIN: NEGATIVE mg/dL
Specific Gravity, Urine: 1.018 (ref 1.005–1.030)
pH: 6 (ref 5.0–8.0)

## 2015-08-20 LAB — COMPREHENSIVE METABOLIC PANEL
ALBUMIN: 3.8 g/dL (ref 3.5–5.0)
ALK PHOS: 59 U/L (ref 38–126)
ALT: 16 U/L (ref 14–54)
AST: 14 U/L — AB (ref 15–41)
Anion gap: 10 (ref 5–15)
BUN: 14 mg/dL (ref 6–20)
CALCIUM: 9.4 mg/dL (ref 8.9–10.3)
CHLORIDE: 104 mmol/L (ref 101–111)
CO2: 23 mmol/L (ref 22–32)
CREATININE: 0.87 mg/dL (ref 0.44–1.00)
GFR calc Af Amer: >60 mL/min (ref >60)
GFR calc non Af Amer: >60 mL/min (ref >60)
GLUCOSE: 105 mg/dL — AB (ref 65–99)
Potassium: 3.8 mmol/L (ref 3.5–5.1)
SODIUM: 137 mmol/L (ref 135–145)
Total Bilirubin: 0.9 mg/dL (ref 0.3–1.2)
Total Protein: 7.3 g/dL (ref 6.5–8.1)

## 2015-08-20 LAB — CBC WITH DIFFERENTIAL/PLATELET
BASOS ABS: 0 10*3/uL (ref 0–0.1)
Basophils Relative: 0 %
EOS ABS: 0.1 10*3/uL (ref 0–0.7)
Eosinophils Relative: 1 %
HCT: 36.5 % (ref 35.0–47.0)
HEMOGLOBIN: 12 g/dL (ref 12.0–16.0)
LYMPHS ABS: 1.1 10*3/uL (ref 1.0–3.6)
Lymphocytes Relative: 16 %
MCH: 27.9 pg (ref 26.0–34.0)
MCHC: 33 g/dL (ref 32.0–36.0)
MCV: 84.5 fL (ref 80.0–100.0)
Monocytes Absolute: 0.6 10*3/uL (ref 0.2–0.9)
Monocytes Relative: 9 %
NEUTROS PCT: 74 %
Neutro Abs: 4.7 10*3/uL (ref 1.4–6.5)
Platelets: 233 10*3/uL (ref 150–440)
RBC: 4.32 MIL/uL (ref 3.80–5.20)
RDW: 13.2 % (ref 11.5–14.5)
WBC: 6.5 10*3/uL (ref 3.6–11.0)

## 2015-08-20 LAB — URINE DRUG SCREEN, QUALITATIVE (ARMC ONLY)
AMPHETAMINES, UR SCREEN: NOT DETECTED
BENZODIAZEPINE, UR SCRN: NOT DETECTED
Barbiturates, Ur Screen: NOT DETECTED
CANNABINOID 50 NG, UR ~~LOC~~: NOT DETECTED
Cocaine Metabolite,Ur ~~LOC~~: NOT DETECTED
MDMA (ECSTASY) UR SCREEN: NOT DETECTED
Methadone Scn, Ur: NOT DETECTED
Opiate, Ur Screen: NOT DETECTED
Phencyclidine (PCP) Ur S: NOT DETECTED
TRICYCLIC, UR SCREEN: NOT DETECTED

## 2015-08-20 MED ORDER — LORAZEPAM 0.5 MG PO TABS: 0.5000 mg | ORAL_TABLET | Freq: Two times a day (BID) | ORAL | Status: DC | PRN

## 2015-08-20 MED ORDER — FLUTICASONE PROPIONATE 50 MCG/ACT NA SUSP: 1.0000 | Freq: Every day | NASAL | Status: DC

## 2015-08-20 MED ORDER — LORAZEPAM 2 MG/ML IJ SOLN
1.0000 mg | Freq: Once | INTRAMUSCULAR | Status: AC
  Administered 2015-08-20: 1 mg via INTRAVENOUS
  Filled 2015-08-20: qty 1

## 2015-08-20 MED ORDER — SODIUM CHLORIDE 0.9 % IV BOLUS (SEPSIS)
1000.0000 mL | Freq: Once | INTRAVENOUS | Status: AC
  Administered 2015-08-20: 1000 mL via INTRAVENOUS

## 2015-08-20 NOTE — Discharge Instructions (Signed)
Take ativan 0.5 mg twice daily as needed.   Please see your psychiatrist for monthly prescriptions.   Use flonase for sinus congestion.   Return to ER if you have worse congestion, palpitations, trouble breathing.

## 2015-08-20 NOTE — ED Notes (Signed)
States she is having body aches   Chills and SOB   Feels like heart is racing

## 2015-08-20 NOTE — ED Provider Notes (Signed)
CSN: [id number]     Arrival date & time 08/20/15  [id number] History   First MD Initiated Contact with Patient 08/20/15 [id number]     Chief Complaint  Patient presents with  . multiple medical complaints      (Consider location/radiation/quality/duration/timing/severity/associated sxs/prior Treatment) The history is provided by the patient.  Stacey [name] is a 55 y.o. female hx of HTN, depression, DM, chronic benzo use here presenting with tremors, body aches, chills. She states that she is on chronic Ativan and the dose has been decreased recently. She was unable to get follow-up so was seen in the ER about a week ago and was prescribed 0.5 mg Ativan 3 times daily as needed. However, patient has been taking it 3 times a day and ran out of the prescription and has no appointment until January 27th. She ran out of her Ativan several days ago and now has been having some subjective body aches as well as chills. She feels that her heart is racing but denies any chest pain. Denies any abdominal pain or urinary symptoms. Patient also has been anxious and had more frequent panic attacks.     Past Medical History  Diagnosis Date  . Hypertension   . Depression   . Type 2 diabetes mellitus ([place])   . Diverticulitis   . Asthma   . Cyst     pt has "cyst" to legs for years, areas drain at time  . Pancreatitis   . Arthritis   . C. difficile colitis 11/08/2011  . Musculoskeletal pain 11/08/2011  . Neuropathic pain 11/10/2011  . Sleep apnea with use of continuous positive airway pressure (CPAP)     uses CPAP at night  . 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 DVT (deep venous thrombosis) [facility])    Past Surgical History  Procedure Laterality Date  . Tonsillectomy    . Cholecystectomy    . Cesarean section    . Nose surgery     Family History  Problem Relation Age of Onset  . Stroke Other   . Cancer Other   . Diabetes Other    Social History  Substance Use Topics  .  Smoking status: Former Research scientist (life sciences)  . Smokeless tobacco: Never Used  . Alcohol Use: No   OB History    Gravida Para Term Preterm AB TAB SAB Ectopic Multiple Living   3 2 2  1  1   2      Review of Systems  Respiratory: Positive for shortness of breath.   Psychiatric/Behavioral: Negative for suicidal ideas, sleep disturbance and self-injury. The patient is nervous/anxious.   All other systems reviewed and are negative.     Allergies  Hydrocodone; Nitroglycerin; Tramadol; Dilaudid; Levaquin; Toradol; Trazodone and nefazodone; and Sulfa antibiotics  Home Medications   Prior to Admission medications   Medication Sig Start Date End Date Taking? Authorizing Provider  albuterol (PROVENTIL HFA;VENTOLIN HFA) 108 (90 BASE) MCG/ACT inhaler Inhale 2 puffs into the lungs every 4 (four) hours as needed for wheezing or shortness of breath. 07/19/13  Yes Michiel Cowboy, MD  buPROPion (WELLBUTRIN XL) 300 MG 24 hr tablet Take 300 mg by mouth daily.    Yes Historical Provider, MD  busPIRone (BUSPAR) 15 MG tablet Take 2 tablets (30 mg total) by mouth 2 (two) times daily. 08/15/13  Yes Dixon Boos, MD  Cetirizine HCl 10 MG CAPS Take 1 capsule (10 mg total) by mouth daily. 07/05/15  Yes Cari B  Triplett, FNP  escitalopram (LEXAPRO) 20 MG tablet Take 20 mg by mouth 3 (three) times daily.    Yes Historical Provider, MD  hydrochlorothiazide (HYDRODIURIL) 25 MG tablet Take 25 mg by mouth at bedtime.    Yes Historical Provider, MD  lisinopril (PRINIVIL,ZESTRIL) 10 MG tablet Take 10 mg by mouth daily.    Yes Historical Provider, MD  LORazepam (ATIVAN) 0.5 MG tablet Take 1 tablet (0.5 mg total) by mouth every 8 (eight) hours as needed for anxiety. 08/14/15 08/13/16 Yes Delman Kitten, MD  warfarin (COUMADIN) 5 MG tablet Take 7.5-10 mg by mouth daily at 6 PM. Patient take 7.5 mg for Mon Wed Fri AND 10 mg all other days   Yes Historical Provider, MD  doxycycline (VIBRAMYCIN) 100 MG capsule Take 1 capsule (100 mg total)  by mouth 2 (two) times daily. Patient not taking: Reported on 08/20/2015 06/01/15   Victorino Dike, FNP  nystatin cream (MYCOSTATIN) Apply 1 application topically 2 (two) times daily as needed for dry skin.  03/03/15   Historical Provider, MD  predniSONE (DELTASONE) 50 MG tablet 1 tablet for 6 days, one half tablet for 6 days Patient not taking: Reported on 08/20/2015 03/25/15   Nat Christen, MD   BP 137/68 mmHg  Pulse 58  Temp(Src) 98 F (36.7 C)  Resp 12  Ht 5\' 6"  (1.676 m)  Wt 402 lb (182.346 kg)  BMI 64.92 kg/m2  SpO2 100% Physical Exam  Constitutional: She is oriented to person, place, and time.  Anxious   HENT:  Head: Normocephalic.  Mouth/Throat: Oropharynx is clear and moist.  Eyes: Pupils are equal, round, and reactive to light.  Neck: Normal range of motion.  Cardiovascular: Normal rate, regular rhythm and normal heart sounds.   Pulmonary/Chest: Effort normal and breath sounds normal. No respiratory distress. She has no wheezes. She has no rales.  Abdominal: Soft. Bowel sounds are normal. She exhibits no distension. There is no tenderness. There is no rebound.  Musculoskeletal: Normal range of motion. She exhibits no edema or tenderness.  Neurological: She is alert and oriented to person, place, and time. No cranial nerve deficit. Coordination normal.  Skin: Skin is warm and dry.  Psychiatric:  Anxious   Nursing note and vitals reviewed.   ED Course  Procedures (including critical care time) Labs Review Labs Reviewed  COMPREHENSIVE METABOLIC PANEL - Abnormal; Notable for the following:    Glucose, Bld 105 (*)    AST 14 (*)    All other components within normal limits  URINALYSIS COMPLETEWITH MICROSCOPIC ([facility] ONLY) - Abnormal; Notable for the following:    Color, Urine YELLOW (*)    APPearance CLEAR (*)    Bacteria, UA RARE (*)    Squamous Epithelial / LPF 0-5 (*)    All other components within normal limits  URINE DRUG SCREEN, QUALITATIVE ([facility] ONLY)  CBC WITH  DIFFERENTIAL/PLATELET    Imaging Review No results found. I have personally reviewed and evaluated these images and lab results as part of my medical decision-making.   EKG Interpretation None       ED ECG REPORT I, YAO, DAVID, the attending physician, personally viewed and interpreted this ECG.   Date: 08/20/2015  EKG Time: 8:30 am  Rate: 71  Rhythm: normal EKG, normal sinus rhythm  Axis: normal  Intervals:none  ST&T Change: none   MDM   Final diagnoses:  None    Stacey [name] is a 55 y.o. female here with anxiety, shortness of breath.  Slightly hypertensive in the ED. Symptoms consistent with benzo withdrawal. Patient not tachy, I doubt PE. Will get basic labs, UDS. Will give ativan and reassess.   11:22 AM Patient felt better. Labs unremarkable. I told her that I will decrease ativan to 0.5 mg BID prn. Has appointment on 1/27. Told her to get her regular prescriptions from psychiatrist.     Wandra Arthurs, MD 08/20/15 1124

## 2015-08-20 NOTE — ED Notes (Signed)
Pt tearful and sts " I think I know what is wrong" When asked, pt reports she ran out of her benzos four days ago and she believes she is going through withdrawal.

## 2015-10-09 ENCOUNTER — Emergency Department: Payer: Medicaid Other

## 2015-10-09 ENCOUNTER — Encounter: Payer: Self-pay | Admitting: Medical Oncology

## 2015-10-09 ENCOUNTER — Emergency Department
Admission: EM | Admit: 2015-10-09 | Discharge: 2015-10-09 | Disposition: A | Discharge status: Home / Self Care | Payer: Medicaid Other | Attending: Emergency Medicine | Admitting: Emergency Medicine

## 2015-10-09 DIAGNOSIS — E119 Type 2 diabetes mellitus without complications: Secondary | ICD-10-CM | POA: Diagnosis not present

## 2015-10-09 DIAGNOSIS — M5431 Sciatica, right side: Secondary | ICD-10-CM | POA: Diagnosis not present

## 2015-10-09 DIAGNOSIS — Z87891 Personal history of nicotine dependence: Secondary | ICD-10-CM | POA: Diagnosis not present

## 2015-10-09 DIAGNOSIS — J45901 Unspecified asthma with (acute) exacerbation: Secondary | ICD-10-CM | POA: Diagnosis not present

## 2015-10-09 DIAGNOSIS — I10 Essential (primary) hypertension: Secondary | ICD-10-CM | POA: Diagnosis not present

## 2015-10-09 DIAGNOSIS — M79604 Pain in right leg: Secondary | ICD-10-CM | POA: Diagnosis present

## 2015-10-09 DIAGNOSIS — R601 Generalized edema: Secondary | ICD-10-CM | POA: Diagnosis not present

## 2015-10-09 LAB — CBC WITH DIFFERENTIAL/PLATELET
BASOS ABS: 0.1 10*3/uL (ref 0–0.1)
BASOS PCT: 1 %
EOS ABS: 0.1 10*3/uL (ref 0–0.7)
Eosinophils Relative: 2 %
HCT: 36.5 % (ref 35.0–47.0)
HEMOGLOBIN: 12.2 g/dL (ref 12.0–16.0)
Lymphocytes Relative: 27 %
Lymphs Abs: 1.7 10*3/uL (ref 1.0–3.6)
MCH: 28.1 pg (ref 26.0–34.0)
MCHC: 33.4 g/dL (ref 32.0–36.0)
MCV: 84.1 fL (ref 80.0–100.0)
MONOS PCT: 7 %
Monocytes Absolute: 0.4 10*3/uL (ref 0.2–0.9)
NEUTROS PCT: 63 %
Neutro Abs: 3.9 10*3/uL (ref 1.4–6.5)
PLATELETS: 197 10*3/uL (ref 150–440)
RBC: 4.34 MIL/uL (ref 3.80–5.20)
RDW: 13.3 % (ref 11.5–14.5)
WBC: 6.1 10*3/uL (ref 3.6–11.0)

## 2015-10-09 LAB — BASIC METABOLIC PANEL
ANION GAP: 5 (ref 5–15)
BUN: 23 mg/dL — AB (ref 6–20)
CHLORIDE: 105 mmol/L (ref 101–111)
CO2: 26 mmol/L (ref 22–32)
Calcium: 9.5 mg/dL (ref 8.9–10.3)
Creatinine, Ser: 0.97 mg/dL (ref 0.44–1.00)
Glucose, Bld: 114 mg/dL — ABNORMAL HIGH (ref 65–99)
POTASSIUM: 4.2 mmol/L (ref 3.5–5.1)
SODIUM: 136 mmol/L (ref 135–145)

## 2015-10-09 LAB — PROTIME-INR
INR: 2.03
PROTHROMBIN TIME: 22.8 s — AB (ref 11.4–15.0)

## 2015-10-09 LAB — TROPONIN I

## 2015-10-09 LAB — BRAIN NATRIURETIC PEPTIDE: B NATRIURETIC PEPTIDE 5: 10 pg/mL (ref 0.0–100.0)

## 2015-10-09 MED ORDER — PREDNISONE 10 MG (21) PO TBPK: 10.0000 mg | ORAL_TABLET | Freq: Every day | ORAL | Status: DC

## 2015-10-09 MED ORDER — HYDROMORPHONE HCL 1 MG/ML IJ SOLN: 1.0000 mg | Freq: Once | INTRAMUSCULAR | Status: DC

## 2015-10-09 MED ORDER — OXYCODONE-ACETAMINOPHEN 5-325 MG PO TABS: 2.0000 | ORAL_TABLET | Freq: Four times a day (QID) | ORAL | Status: DC | PRN

## 2015-10-09 NOTE — ED Provider Notes (Signed)
[facility] Emergency Department Provider Note     Time seen: ----------------------------------------- 9:12 AM on 10/09/2015 -----------------------------------------    I have reviewed the triage vital signs and the nursing notes.   HISTORY  Chief Complaint Leg Pain    HPI Stacey [name] is a 55 y.o. female who presents ER for right leg pain. Patient reports pain from the right thigh and hip and buttock to her calf. Also reports history DVT in this leg, currently she is on Coumadin. Onset a DVT was about 2 years ago, does report some mild shortness of breath. Nothing makes her symptoms better, movement of the leg makes her symptoms worse. She denies fevers chills or other complaints.   Past Medical History  Diagnosis Date  . Hypertension   . Depression   . Type 2 diabetes mellitus ([place])   . Diverticulitis   . Asthma   . Cyst     pt has "cyst" to legs for years, areas drain at time  . Pancreatitis   . Arthritis   . C. difficile colitis 11/08/2011  . Musculoskeletal pain 11/08/2011  . Neuropathic pain 11/10/2011  . Sleep apnea with use of continuous positive airway pressure (CPAP)     uses CPAP at night  . 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 DVT (deep venous thrombosis) [facility])     Patient Active Problem List   Diagnosis Date Noted  . Chest pain 08/14/2013  . Atypical chest pain 08/14/2013  . Localized swelling, mass, or lump of right lower extremity 08/14/2013  . Pulmonary embolism 07/16/2013  . Short of breath on exertion 07/16/2013  . Cellulitis of leg, right 06/03/2013  . Anemia 02/18/2013  . Diabetes mellitus type II, controlled ([place]) 02/17/2013  . 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 Leukocytosis 02/07/2013  . LLQ abdominal pain 10/28/2012  . UTI (lower urinary tract infection) 10/28/2012  . Dyspnea 10/28/2012  . OSA (obstructive sleep apnea) 10/28/2012  . RLQ abdominal  pain 06/20/2012  . Neuropathic pain 11/10/2011  . C. difficile colitis 11/08/2011  . Musculoskeletal pain 11/08/2011  . Bradycardia 11/08/2011  . PVC's (premature ventricular contractions) 11/07/2011  . Chest pain, atypical 11/07/2011  . Gastroenteritis 11/06/2011  . Heart palpitations 11/06/2011  . Morbid obesity ([place]) 11/06/2011  . Anxiety and depression 11/06/2011  . HTN (hypertension) 11/06/2011  . Depression 11/06/2011    Past Surgical History  Procedure Laterality Date  . Tonsillectomy    . Cholecystectomy    . Cesarean section    . Nose surgery      Allergies Hydrocodone; Nitroglycerin; Tramadol; Dilaudid; Levaquin; Toradol; Trazodone and nefazodone; and Sulfa antibiotics  Social History Social History  Substance Use Topics  . Smoking status: Former Research scientist (life sciences)  . Smokeless tobacco: Never Used  . Alcohol Use: No    Review of Systems Constitutional: Negative for fever. Eyes: Negative for visual changes. ENT: Negative for sore throat. Cardiovascular: Negative for chest pain. Respiratory: Positive for shortness of breath Gastrointestinal: Negative for abdominal pain, vomiting and diarrhea. Genitourinary: Negative for dysuria. Musculoskeletal: Positive for leg pain and swelling, positive for low back pain Skin: Negative for rash. Neurological: Negative for headaches, focal weakness or numbness.  10-point ROS otherwise negative.  ____________________________________________   PHYSICAL EXAM:  VITAL SIGNS: ED Triage Vitals  Enc Vitals Group     BP 10/09/15 0900 136/58 mmHg     Pulse Rate 10/09/15 0900 66     Resp  10/09/15 0900 22     Temp 10/09/15 0900 97.6 F (36.4 C)     Temp Source 10/09/15 0900 Oral     SpO2 --      Weight 10/09/15 0900 402 lb (182.346 kg)     Height 10/09/15 0900 5\' 6"  (1.676 m)     Head Cir --      Peak Flow --      Pain Score 10/09/15 0902 10     Pain Loc --      Pain Edu? --      Excl. in [place]? --     Constitutional: Alert and  oriented. Morbidly obese Eyes: Conjunctivae are normal. PERRL. Normal extraocular movements. ENT   Head: Normocephalic and atraumatic.   Nose: No congestion/rhinnorhea.   Mouth/Throat: Mucous membranes are moist.   Neck: No stridor. Cardiovascular: Normal rate, regular rhythm. Normal and symmetric distal pulses are present in all extremities. No murmurs, rubs, or gallops. Respiratory: Normal respiratory effort without tachypnea nor retractions. Breath sounds are clear and equal bilaterally. No wheezes/rales/rhonchi. Gastrointestinal: Soft and nontender. No distention. No abdominal bruits.  Musculoskeletal: Nontender with normal range of motion in all extremities. No joint effusions.  Bilateral lower extremity edema with pain with range of motion of the right leg and hip. Size makes it difficult to assess for sciatica Neurologic:  Normal speech and language. No gross focal neurologic deficits are appreciated.  Skin:  Skin is warm, dry with scattered abrasions over the extremities Psychiatric: Mood and affect are normal. Speech and behavior are normal. Patient exhibits appropriate insight and judgment. ____________________________________________  ED COURSE:  Pertinent labs & imaging results that were available during my care of the patient were reviewed by me and considered in my medical decision making (see chart for details). Patient is in no acute distress, will assess for new DVT and possibly PE. Symptoms are more consistent with sciatica. ____________________________________________    LABS (pertinent positives/negatives)  Labs Reviewed  BASIC METABOLIC PANEL - Abnormal; Notable for the following:    Glucose, Bld 114 (*)    BUN 23 (*)    All other components within normal limits  PROTIME-INR - Abnormal; Notable for the following:    Prothrombin Time 22.8 (*)    All other components within normal limits  CBC WITH DIFFERENTIAL/PLATELET  BRAIN NATRIURETIC PEPTIDE   TROPONIN I    RADIOLOGY Images were viewed by me  Chest x-ray, bilateral lower extremity ultrasound IMPRESSION: 1. Difficult exam due to patient body habitus. 2. No evidence of deep venous thrombosis within the LEFT or RIGHT lower extremity. IMPRESSION: Stable cardiac enlargement with no acute findings  ____________________________________________  FINAL ASSESSMENT AND PLAN  Sciatica, edema  Plan: Patient with labs and imaging as dictated above. Patient is in no acute distress, this is likely sciatica and secondary to her large body habitus. She was placed on prednisone and given pain medication. She is referred to her primary care doctor for follow-up as an outpatient.   Earleen Newport, MD   Earleen Newport, MD 10/09/15 [phone]

## 2015-10-09 NOTE — ED Notes (Signed)
Pt escorted to lobby via wheelchair.  Patient waiting for family and states she has plenty of help at home. Pt is able to ambulate with minimal assistance.

## 2015-10-09 NOTE — ED Notes (Signed)
Patient transported to Ultrasound 

## 2015-10-09 NOTE — ED Notes (Signed)
Pt c/o right calf pain and dyspnea with exertion.  Has hx of dvt and PE.  Mildly labored breathing noted with exertion in room.  Pt reports BLE slightly larger than baseline.

## 2015-10-09 NOTE — ED Notes (Signed)
Pt to ed with reports of rt leg pain from calf, thigh hip and buttock. 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 Pt also reports some sob.

## 2015-10-09 NOTE — Discharge Instructions (Signed)
Edema Edema is an abnormal buildup of fluids. It is more common in your legs and thighs. Painless swelling of the feet and ankles is more likely as a person ages. It also is common in looser skin, like around your eyes. HOME CARE   Keep the affected body part above the level of the heart while lying down.  Do not sit still or stand for a long time.  Do not put anything right under your knees when you lie down.  Do not wear tight clothes on your upper legs.  Exercise your legs to help the puffiness (swelling) go down.  Wear elastic bandages or support stockings as told by your doctor.  A low-salt diet may help lessen the puffiness.  Only take medicine as told by your doctor. GET HELP IF:  Treatment is not working.  You have heart, liver, or kidney disease and notice that your skin looks puffy or shiny.  You have puffiness in your legs that does not get better when you raise your legs.  You have sudden weight gain for no reason. GET HELP RIGHT AWAY IF:   You have shortness of breath or chest pain.  You cannot breathe when you lie down.  You have pain, redness, or warmth in the areas that are puffy.  You have heart, liver, or kidney disease and get edema all of a sudden.  You have a fever and your symptoms get worse all of a sudden. MAKE SURE YOU:   Understand these instructions.  Will watch your condition.  Will get help right away if you are not doing well or get worse.   This information is not intended to replace advice given to you by your health care provider. Make sure you discuss any questions you have with your health care provider.   Document Released: 01/04/2008 Document Revised: 07/23/2013 Document Reviewed: 05/10/2013 Elsevier Interactive Patient Education 2016 Elsevier Inc.  Sciatica Sciatica is pain, weakness, numbness, or tingling along the path of the sciatic nerve. The nerve starts in the lower back and runs down the back of each leg. The nerve  controls the muscles in the lower leg and in the back of the knee, while also providing sensation to the back of the thigh, lower leg, and the sole of your foot. Sciatica is a symptom of another medical condition. For instance, nerve damage or certain conditions, such as a herniated disk or bone spur on the spine, pinch or put pressure on the sciatic nerve. This causes the pain, weakness, or other sensations normally associated with sciatica. Generally, sciatica only affects one side of the body. CAUSES   Herniated or slipped disc.  Degenerative disk disease.  A pain disorder involving the narrow muscle in the buttocks (piriformis syndrome).  Pelvic injury or fracture.  Pregnancy.  Tumor (rare). SYMPTOMS  Symptoms can vary from mild to very severe. The symptoms usually travel from the low back to the buttocks and down the back of the leg. Symptoms can include:  Mild tingling or dull aches in the lower back, leg, or hip.  Numbness in the back of the calf or sole of the foot.  Burning sensations in the lower back, leg, or hip.  Sharp pains in the lower back, leg, or hip.  Leg weakness.  Severe back pain inhibiting movement. These symptoms may get worse with coughing, sneezing, laughing, or prolonged sitting or standing. Also, being overweight may worsen symptoms. DIAGNOSIS  Your caregiver will perform a physical exam to look  for common symptoms of sciatica. He or she may ask you to do certain movements or activities that would trigger sciatic nerve pain. Other tests may be performed to find the cause of the sciatica. These may include:  Blood tests.  X-rays.  Imaging tests, such as an MRI or CT scan. TREATMENT  Treatment is directed at the cause of the sciatic pain. Sometimes, treatment is not necessary and the pain and discomfort goes away on its own. If treatment is needed, your caregiver may suggest:  Over-the-counter medicines to relieve pain.  Prescription medicines, such  as anti-inflammatory medicine, muscle relaxants, or narcotics.  Applying heat or ice to the painful area.  Steroid injections to lessen pain, irritation, and inflammation around the nerve.  Reducing activity during periods of pain.  Exercising and stretching to strengthen your abdomen and improve flexibility of your spine. Your caregiver may suggest losing weight if the extra weight makes the back pain worse.  Physical therapy.  Surgery to eliminate what is pressing or pinching the nerve, such as a bone spur or part of a herniated disk. HOME CARE INSTRUCTIONS   Only take over-the-counter or prescription medicines for pain or discomfort as directed by your caregiver.  Apply ice to the affected area for 20 minutes, 3-4 times a day for the first 48-72 hours. Then try heat in the same way.  Exercise, stretch, or perform your usual activities if these do not aggravate your pain.  Attend physical therapy sessions as directed by your caregiver.  Keep all follow-up appointments as directed by your caregiver.  Do not wear high heels or shoes that do not provide proper support.  Check your mattress to see if it is too soft. A firm mattress may lessen your pain and discomfort. SEEK IMMEDIATE MEDICAL CARE IF:   You lose control of your bowel or bladder (incontinence).  You have increasing weakness in the lower back, pelvis, buttocks, or legs.  You have redness or swelling of your back.  You have a burning sensation when you urinate.  You have pain that gets worse when you lie down or awakens you at night.  Your pain is worse than you have experienced in the past.  Your pain is lasting longer than 4 weeks.  You are suddenly losing weight without reason. MAKE SURE YOU:  Understand these instructions.  Will watch your condition.  Will get help right away if you are not doing well or get worse.   This information is not intended to replace advice given to you by your health care  provider. Make sure you discuss any questions you have with your health care provider.   Document Released: 07/12/2001 Document Revised: 04/08/2015 Document Reviewed: 11/27/2011 Elsevier Interactive Patient Education [employer].

## 2015-10-30 ENCOUNTER — Emergency Department
Admission: EM | Admit: 2015-10-30 | Discharge: 2015-10-30 | Disposition: A | Discharge status: Home / Self Care | Payer: Medicaid Other | Attending: Emergency Medicine | Admitting: Emergency Medicine

## 2015-10-30 ENCOUNTER — Encounter: Payer: Self-pay | Admitting: Emergency Medicine

## 2015-10-30 DIAGNOSIS — K068 Other specified disorders of gingiva and edentulous alveolar ridge: Secondary | ICD-10-CM

## 2015-10-30 DIAGNOSIS — Z79899 Other long term (current) drug therapy: Secondary | ICD-10-CM | POA: Insufficient documentation

## 2015-10-30 DIAGNOSIS — Z7901 Long term (current) use of anticoagulants: Secondary | ICD-10-CM | POA: Diagnosis not present

## 2015-10-30 DIAGNOSIS — J45909 Unspecified asthma, uncomplicated: Secondary | ICD-10-CM | POA: Diagnosis not present

## 2015-10-30 DIAGNOSIS — I10 Essential (primary) hypertension: Secondary | ICD-10-CM | POA: Diagnosis not present

## 2015-10-30 DIAGNOSIS — I493 Ventricular premature depolarization: Secondary | ICD-10-CM | POA: Insufficient documentation

## 2015-10-30 DIAGNOSIS — I2699 Other pulmonary embolism without acute cor pulmonale: Secondary | ICD-10-CM | POA: Diagnosis not present

## 2015-10-30 DIAGNOSIS — F329 Major depressive disorder, single episode, unspecified: Secondary | ICD-10-CM | POA: Diagnosis not present

## 2015-10-30 DIAGNOSIS — Z87891 Personal history of nicotine dependence: Secondary | ICD-10-CM | POA: Insufficient documentation

## 2015-10-30 DIAGNOSIS — E119 Type 2 diabetes mellitus without complications: Secondary | ICD-10-CM | POA: Insufficient documentation

## 2015-10-30 LAB — CBC
HCT: 36 % (ref 35.0–47.0)
Hemoglobin: 12.3 g/dL (ref 12.0–16.0)
MCH: 28.5 pg (ref 26.0–34.0)
MCHC: 34.2 g/dL (ref 32.0–36.0)
MCV: 83.3 fL (ref 80.0–100.0)
PLATELETS: 245 10*3/uL (ref 150–440)
RBC: 4.31 MIL/uL (ref 3.80–5.20)
RDW: 13.4 % (ref 11.5–14.5)
WBC: 8.3 10*3/uL (ref 3.6–11.0)

## 2015-10-30 LAB — PROTIME-INR
INR: 3.06
PROTHROMBIN TIME: 31.1 s — AB (ref 11.4–15.0)

## 2015-10-30 NOTE — ED Notes (Signed)
Patient presents to the ED with sore throat x 3-4 days.  Patient states, "the past few days my throat has felt dry and when I drink water, I feel like I can taste blood.  Today, when I have been coughing, I've seen some blood".  Patient is in no obvious distress at this time.  Patient takes blood thinners.

## 2015-10-30 NOTE — Discharge Instructions (Signed)
Prothrombin Time, International Normalized Ratio Test WHY AM I HAVING THIS TEST? A prothrombin time (Pro-Time, PT) test measures how many seconds it takes your blood to clot. The international normalized ratio (INR) is a calculation of blood clotting time based on your PT result. Most labs report both PT and INR values when reporting blood clotting times. Your health care provider may want you to have this test done if:  You have certain medical conditions that cause abnormal bleeding or blood clotting. These can include:  Liver disease.  Systemic infection (sepsis).  Inherited (genetic) bleeding disorders.  You are taking a medicine to prevent excessive blood clotting (anticoagulant), such as warfarin.  If you are taking warfarin, you will likely be asked to have this test done at regular intervals. The results of this test will help your health care provider determine what dose of warfarin you need based on how quickly or slowly your blood clots. It is very important to have this test done as often as your health care provider recommends. WHAT KIND OF SAMPLE IS TAKEN? A blood sample is required for this test. It is usually collected by inserting a needle into a vein or by sticking a finger with a small needle. HOW DO I PREPARE FOR THE TEST? There is no preparation required for this test. WHAT ARE THE REFERENCE INTERVALS? Reference intervalsare considered healthy intervalsestablished after testing a large group of healthy people. Reference intervals may vary among different people, labs, and hospitals. It is your responsibility to obtain your test results. Ask the lab or department performing the test when and how you will get your results. Reference intervals for this test are as follows:  Without anticoagulant treatment (control value): 11.0-12.5 seconds; 85-100%.  With full anticoagulant treatment: greater than 1.5-2 times the control value; 20-30%.  INR: 0.8-1.1. WHAT DO THE  RESULTS MEAN? There are several factors that can alter your PT and INR test results. It is important for you to know that:  PT and INR results can be affected by certain foods you eat, especially foods that contain moderate or high amounts of vitamin K. It is important to eat a consistent amount of vitamin K-rich food. Let your health care provider know if you have recently changed your diet.  PT and INR results can be affected by some medicines. Do not stop, add, or change any medicines without letting your health care provider know. Talk with your health care provider to discuss your results, treatment options, and if necessary, the need for more tests. Talk with your health care provider if you have any questions about your results.   This information is not intended to replace advice given to you by your health care provider. Make sure you discuss any questions you have with your health care provider.   Document Released: 08/20/2004 Document Revised: 08/08/2014 Document Reviewed: 12/11/2013 Elsevier Interactive Patient Education 2016 Elsevier Inc.   Periodontal Disease Periodontal disease, or gum disease, is a type of oral disease that affects the surrounding and supporting tissues of the teeth. These include the gums (gingivae), ligaments, and tooth socket (alveolar bone). Periodontal disease can affect one tooth or many teeth. If left untreated, it may lead to tooth loss.  CAUSES The main cause of periodontal disease is dental plaque, which contains harmful bacteria. These bacteria can cause the gums to become inflamed and infected. Further progression of the disease can damage the other supporting tissues.  RISK FACTORS  Diabetes.   Smoking and tobacco use.  Genetics.   Hormonal changes of puberty, menopause, and pregnancy.   Stress.   Clenching or grinding your teeth.   Substance abuse.  Poor nutrition.   Diseases that interfere with the body's immune system.    Certain medicines. SIGNS AND SYMPTOMS  Red or swollen gums.  Bad breath that does not go away.  Gums that have pulled away from the teeth.  Gums that bleed easily.  Permanent teeth that are loose or separating.  Pain when chewing.  Changes in the way your teeth fit together.  Sensitive teeth. DIAGNOSIS  A thorough examination of the periodontal tissues will be done by your dentist. X-rays may be needed. Evaluation of your medical history will be needed to see if there are other factors or underlying conditions that may contribute to the disease. TREATMENT The number and types of treatment will vary depending on the extent of the disease. Treatment may include brushing and flossing only. Further disease progression may necessitate scaling and root planing or even surgery. The main goal is to control the infection. Good oral hygiene at home is necessary for the success of all types of treatment. HOME CARE INSTRUCTIONS   Practice good oral hygiene. This includes flossing and brushing your teeth every day.   See your dentist regularly, at least 2 times per year.   Stop smoking if you smoke.  Eat a well-balanced diet. SEEK IMMEDIATE DENTAL CARE IF:   You have any signs or symptoms of periodontal disease along with:  Swelling of your face, neck, or jaw.  Inability to open your mouth.  Severe pain uncontrolled by pain medicine.  You have a fever or persistent symptoms for more than 2-3 days.  You have a fever and your symptoms suddenly get worse.   This information is not intended to replace advice given to you by your health care provider. Make sure you discuss any questions you have with your health care provider.   Document Released: 07/21/2003 Document Revised: 03/20/2013 Document Reviewed: 12/25/2012 Elsevier Interactive Patient Education [employer].  Your exam and labs are normal today. You may be experiencing some mild bleeding from the gums. Follow-up  with your hematology specialist for further evaluation and management. Rinse with warm salty water and use a soft-bristle toothbrush or damp washcloth to cleanse the teeth.

## 2015-10-30 NOTE — ED Provider Notes (Signed)
[facility] Emergency Department Provider Note ____________________________________________  Time seen: 1717  I have reviewed the triage vital signs and the nursing notes.  HISTORY  Chief Complaint  Sore Throat  HPI Stacey [name] is a 55 y.o. female presents to the ED for evaluation of 3 days complaint of scratchy throat. Patient describes some dryness to the mouth and throat and the taste of blood. She reports several episodes of scant bleeding with spitting to the mouth. She denies any known source of active bleeding including her tongue, throat, or gums. She notes that she is been having some intermittent cough last week, but reports improvement. She denies any chest pain, shortness of breath, or wheeze. She does take blood thinnersfor her history of DVT.   Past Medical History  Diagnosis Date  . Hypertension   . Depression   . Type 2 diabetes mellitus ([place])   . Diverticulitis   . Asthma   . Cyst     pt has "cyst" to legs for years, areas drain at time  . Pancreatitis   . Arthritis   . C. difficile colitis 11/08/2011  . Musculoskeletal pain 11/08/2011  . Neuropathic pain 11/10/2011  . Sleep apnea with use of continuous positive airway pressure (CPAP)     uses CPAP at night  . 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 DVT (deep venous thrombosis) [facility])     Patient Active Problem List   Diagnosis Date Noted  . Chest pain 08/14/2013  . Atypical chest pain 08/14/2013  . Localized swelling, mass, or lump of right lower extremity 08/14/2013  . Pulmonary embolism 07/16/2013  . Short of breath on exertion 07/16/2013  . Cellulitis of leg, right 06/03/2013  . Anemia 02/18/2013  . Diabetes mellitus type II, controlled ([place]) 02/17/2013  . 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 Leukocytosis 02/07/2013  . LLQ abdominal pain 10/28/2012  . UTI (lower urinary tract infection) 10/28/2012  . Dyspnea  10/28/2012  . OSA (obstructive sleep apnea) 10/28/2012  . RLQ abdominal pain 06/20/2012  . Neuropathic pain 11/10/2011  . C. difficile colitis 11/08/2011  . Musculoskeletal pain 11/08/2011  . Bradycardia 11/08/2011  . PVC's (premature ventricular contractions) 11/07/2011  . Chest pain, atypical 11/07/2011  . Gastroenteritis 11/06/2011  . Heart palpitations 11/06/2011  . Morbid obesity ([place]) 11/06/2011  . Anxiety and depression 11/06/2011  . HTN (hypertension) 11/06/2011  . Depression 11/06/2011    Past Surgical History  Procedure Laterality Date  . Tonsillectomy    . Cholecystectomy    . Cesarean section    . Nose surgery      Current Outpatient Rx  Name  Route  Sig  Dispense  Refill  . albuterol (PROVENTIL HFA;VENTOLIN HFA) 108 (90 BASE) MCG/ACT inhaler   Inhalation   Inhale 2 puffs into the lungs every 4 (four) hours as needed for wheezing or shortness of breath.   1 Inhaler   0   . buPROPion (WELLBUTRIN XL) 300 MG 24 hr tablet   Oral   Take 300 mg by mouth daily.          . busPIRone (BUSPAR) 15 MG tablet   Oral   Take 2 tablets (30 mg total) by mouth 2 (two) times daily.   120 tablet   1   . escitalopram (LEXAPRO) 20 MG tablet   Oral   Take 20 mg by mouth 3 (three) times daily.          Marland Kitchen  hydrochlorothiazide (HYDRODIURIL) 25 MG tablet   Oral   Take 25 mg by mouth daily.          Marland Kitchen lisinopril (PRINIVIL,ZESTRIL) 10 MG tablet   Oral   Take 10 mg by mouth daily.          Marland Kitchen LORazepam (ATIVAN) 0.5 MG tablet   Oral   Take 1 tablet (0.5 mg total) by mouth 2 (two) times daily as needed for anxiety.   15 tablet   0   . nystatin cream (MYCOSTATIN)   Topical   Apply 1 application topically 2 (two) times daily as needed for dry skin.       3   . oxyCODONE-acetaminophen (PERCOCET) 5-325 MG tablet   Oral   Take 2 tablets by mouth every 6 (six) hours as needed for moderate pain or severe pain.   30 tablet   0   . predniSONE (STERAPRED UNI-PAK 21  TAB) 10 MG (21) TBPK tablet   Oral   Take 1 tablet (10 mg total) by mouth daily.   21 tablet   0   . warfarin (COUMADIN) 10 MG tablet   Oral   Take 10 mg by mouth daily.           Allergies Hydrocodone; Nitroglycerin; Tramadol; Dilaudid; Levaquin; Toradol; Trazodone and nefazodone; and Sulfa antibiotics  Family History  Problem Relation Age of Onset  . Stroke Other   . Cancer Other   . Diabetes Other     Social History Social History  Substance Use Topics  . Smoking status: Former Research scientist (life sciences)  . Smokeless tobacco: Never Used  . Alcohol Use: No   Review of Systems  Constitutional: Negative for fever. Eyes: Negative for visual changes. ENT: Negative for sore throat. Cardiovascular: Negative for chest pain. Respiratory: Negative for shortness of breath. Gastrointestinal: Negative for abdominal pain, vomiting and diarrhea. Genitourinary: Negative for dysuria. Musculoskeletal: Negative for back pain. Skin: Negative for rash. Neurological: Negative for headaches, focal weakness or numbness. ____________________________________________  PHYSICAL EXAM:  VITAL SIGNS: ED Triage Vitals  Enc Vitals Group     BP 10/30/15 1650 159/83 mmHg     Pulse Rate 10/30/15 1650 78     Resp --      Temp 10/30/15 1650 99.3 F (37.4 C)     Temp Source 10/30/15 1650 Oral     SpO2 10/30/15 1650 95 %     Weight 10/30/15 1650 403 lb (182.8 kg)     Height 10/30/15 1650 5\' 6"  (1.676 m)     Head Cir --      Peak Flow --      Pain Score 10/30/15 1651 4     Pain Loc --      Pain Edu? --      Excl. in [place]? --    Constitutional: Alert and oriented. Well appearing and in no distress. Head: Normocephalic and atraumatic.      Eyes: Conjunctivae are normal. PERRL. Normal extraocular movements      Ears: Canals clear. TMs intact bilaterally.   Nose: No congestion/rhinorrhea.   Mouth/Throat: Mucous membranes are moist.Uvula is midline and tonsils are absent. No active bleeding, lesion,  sores, or abscesses appreciated. Gums appear normal without inflammation, irritation, or active bleeding.   Neck: Supple. No thyromegaly. Hematological/Lymphatic/Immunological: No cervical lymphadenopathy. Cardiovascular: Normal rate, regular rhythm.  Respiratory: Normal respiratory effort. No wheezes/rales/rhonchi. Gastrointestinal: Soft and nontender. No distention. Musculoskeletal: Nontender with normal range of motion in all extremities.  Neurologic:  Normal  gait without ataxia. Normal speech and language. No gross focal neurologic deficits are appreciated. Skin:  Skin is warm, dry and intact. No rash noted. ____________________________________________   LABS (pertinent positives/negatives) Labs Reviewed  PROTIME-INR - Abnormal; Notable for the following:    Prothrombin Time 31.1 (*)    All other components within normal limits  CBC  ____________________________________________  INITIAL IMPRESSION / ASSESSMENT AND PLAN / ED COURSE  Patient reassured following her therapeutic INR and the lab. She will follow with the primary provider at [facility] for ongoing management. She will be discharged with instructions to continue to monitor for bleeding from the gums and return to the ED as needed for uncontrolled bleeding. ____________________________________________  FINAL CLINICAL IMPRESSION(S) / ED DIAGNOSES  Final diagnoses:  Long-term (current) use of anticoagulants, INR goal 2.0-3.0  Bleeding gums      Melvenia Needles, PA-C 10/30/15 1833  Lisa Roca, MD 10/30/15 1845

## 2015-11-14 ENCOUNTER — Emergency Department (HOSPITAL_COMMUNITY): Payer: Medicaid Other

## 2015-11-14 ENCOUNTER — Observation Stay (HOSPITAL_COMMUNITY)
Admission: EM | Admit: 2015-11-14 | Discharge: 2015-11-17 | Disposition: A | Discharge status: Home / Self Care | Payer: Medicaid Other | Attending: Internal Medicine | Admitting: Internal Medicine

## 2015-11-14 ENCOUNTER — Encounter (HOSPITAL_COMMUNITY): Payer: Self-pay

## 2015-11-14 DIAGNOSIS — Z91041 Radiographic dye allergy status: Secondary | ICD-10-CM | POA: Insufficient documentation

## 2015-11-14 DIAGNOSIS — E1140 Type 2 diabetes mellitus with diabetic neuropathy, unspecified: Secondary | ICD-10-CM | POA: Insufficient documentation

## 2015-11-14 DIAGNOSIS — R200 Anesthesia of skin: Secondary | ICD-10-CM | POA: Diagnosis not present

## 2015-11-14 DIAGNOSIS — G459 Transient cerebral ischemic attack, unspecified: Secondary | ICD-10-CM | POA: Diagnosis not present

## 2015-11-14 DIAGNOSIS — E785 Hyperlipidemia, unspecified: Secondary | ICD-10-CM | POA: Diagnosis not present

## 2015-11-14 DIAGNOSIS — Z6841 Body Mass Index (BMI) 40.0 and over, adult: Secondary | ICD-10-CM | POA: Diagnosis not present

## 2015-11-14 DIAGNOSIS — R531 Weakness: Secondary | ICD-10-CM | POA: Diagnosis not present

## 2015-11-14 DIAGNOSIS — I2782 Chronic pulmonary embolism: Secondary | ICD-10-CM | POA: Diagnosis not present

## 2015-11-14 DIAGNOSIS — I82509 Chronic embolism and thrombosis of unspecified deep veins of unspecified lower extremity: Secondary | ICD-10-CM | POA: Diagnosis not present

## 2015-11-14 DIAGNOSIS — G4733 Obstructive sleep apnea (adult) (pediatric): Secondary | ICD-10-CM | POA: Insufficient documentation

## 2015-11-14 DIAGNOSIS — E119 Type 2 diabetes mellitus without complications: Secondary | ICD-10-CM

## 2015-11-14 DIAGNOSIS — R29810 Facial weakness: Secondary | ICD-10-CM | POA: Diagnosis present

## 2015-11-14 DIAGNOSIS — I10 Essential (primary) hypertension: Secondary | ICD-10-CM | POA: Diagnosis present

## 2015-11-14 DIAGNOSIS — F329 Major depressive disorder, single episode, unspecified: Secondary | ICD-10-CM | POA: Insufficient documentation

## 2015-11-14 DIAGNOSIS — Z7901 Long term (current) use of anticoagulants: Secondary | ICD-10-CM | POA: Diagnosis not present

## 2015-11-14 DIAGNOSIS — I639 Cerebral infarction, unspecified: Secondary | ICD-10-CM

## 2015-11-14 HISTORY — DX: Other pulmonary embolism without acute cor pulmonale: I26.99

## 2015-11-14 LAB — CBC
HEMATOCRIT: 37.2 % (ref 36.0–46.0)
Hemoglobin: 11.8 g/dL — ABNORMAL LOW (ref 12.0–15.0)
MCH: 27.8 pg (ref 26.0–34.0)
MCHC: 31.7 g/dL (ref 30.0–36.0)
MCV: 87.5 fL (ref 78.0–100.0)
PLATELETS: 256 10*3/uL (ref 150–400)
RBC: 4.25 MIL/uL (ref 3.87–5.11)
RDW: 13.7 % (ref 11.5–15.5)
WBC: 8.7 10*3/uL (ref 4.0–10.5)

## 2015-11-14 LAB — I-STAT TROPONIN, ED: TROPONIN I, POC: 0.01 ng/mL (ref 0.00–0.08)

## 2015-11-14 LAB — DIFFERENTIAL
BASOS ABS: 0 10*3/uL (ref 0.0–0.1)
Basophils Relative: 0 %
Eosinophils Absolute: 0.2 10*3/uL (ref 0.0–0.7)
Eosinophils Relative: 2 %
LYMPHS ABS: 2.5 10*3/uL (ref 0.7–4.0)
Lymphocytes Relative: 29 %
MONOS PCT: 7 %
Monocytes Absolute: 0.6 10*3/uL (ref 0.1–1.0)
Neutro Abs: 5.5 10*3/uL (ref 1.7–7.7)
Neutrophils Relative %: 62 %

## 2015-11-14 LAB — I-STAT CHEM 8, ED
BUN: 21 mg/dL — ABNORMAL HIGH (ref 6–20)
CALCIUM ION: 1.14 mmol/L (ref 1.12–1.23)
CHLORIDE: 106 mmol/L (ref 101–111)
Creatinine, Ser: 0.9 mg/dL (ref 0.44–1.00)
Glucose, Bld: 124 mg/dL — ABNORMAL HIGH (ref 65–99)
HCT: 37 % (ref 36.0–46.0)
HEMOGLOBIN: 12.6 g/dL (ref 12.0–15.0)
Potassium: 4.2 mmol/L (ref 3.5–5.1)
SODIUM: 141 mmol/L (ref 135–145)
TCO2: 23 mmol/L (ref 0–100)

## 2015-11-14 LAB — COMPREHENSIVE METABOLIC PANEL
ALK PHOS: 54 U/L (ref 38–126)
ALT: 22 U/L (ref 14–54)
AST: 20 U/L (ref 15–41)
Albumin: 3.6 g/dL (ref 3.5–5.0)
Anion gap: 10 (ref 5–15)
BUN: 19 mg/dL (ref 6–20)
CO2: 23 mmol/L (ref 22–32)
CREATININE: 0.94 mg/dL (ref 0.44–1.00)
Calcium: 9.4 mg/dL (ref 8.9–10.3)
Chloride: 104 mmol/L (ref 101–111)
GFR calc Af Amer: >60 mL/min (ref >60)
Glucose, Bld: 127 mg/dL — ABNORMAL HIGH (ref 65–99)
Potassium: 4 mmol/L (ref 3.5–5.1)
Sodium: 137 mmol/L (ref 135–145)
TOTAL PROTEIN: 6.9 g/dL (ref 6.5–8.1)
Total Bilirubin: 0.5 mg/dL (ref 0.3–1.2)

## 2015-11-14 LAB — PROTIME-INR
INR: 1.8 — ABNORMAL HIGH (ref 0.00–1.49)
PROTHROMBIN TIME: 20.9 s — AB (ref 11.6–15.2)

## 2015-11-14 LAB — CBG MONITORING, ED: Glucose-Capillary: 112 mg/dL — ABNORMAL HIGH (ref 65–99)

## 2015-11-14 LAB — APTT: APTT: 38 s — AB (ref 24–37)

## 2015-11-14 NOTE — ED Notes (Signed)
Rob PA at bedside 

## 2015-11-14 NOTE — ED Provider Notes (Signed)
CSN: [id number]     Arrival date & time 11/14/15  2143 History   First MD Initiated Contact with Patient 11/14/15 2144     Chief Complaint  Patient presents with  . Code Stroke    An emergency department physician performed an initial assessment on this suspected stroke patient at 2146. (Consider location/radiation/quality/duration/timing/severity/associated sxs/prior Treatment) HPI Comments: Patient presents to the emergency department with chief complaint of left-sided facial droop, and left-sided weakness that started this evening at 1950.  She also reports that she has some decreased sensation in her left arm. She states she has had some similar symptoms in the past when she becomes upset, and states that she was arguing with family member prior to onset of symptoms. 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 She denies any headache, fever, chest pain, shortness breath, nausea, vomiting, or diarrhea. There are no modifying factors.  The history is provided by the patient. No language interpreter was used.    Past Medical History  Diagnosis Date  . Hypertension   . Depression   . Type 2 diabetes mellitus ([place])   . Diverticulitis   . Asthma   . Cyst     pt has "cyst" to legs for years, areas drain at time  . Pancreatitis   . Arthritis   . C. difficile colitis 11/08/2011  . Musculoskeletal pain 11/08/2011  . Neuropathic pain 11/10/2011  . Sleep apnea with use of continuous positive airway pressure (CPAP)     uses CPAP at night  . 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 DVT (deep venous thrombosis) ([place])   . Pulmonary embolism [facility])    Past Surgical History  Procedure Laterality Date  . Tonsillectomy    . Cholecystectomy    . Cesarean section    . Nose surgery     Family History  Problem Relation Age of Onset  . Stroke Other   . Cancer Other   . Diabetes Other    Social History  Substance Use Topics  . Smoking status: Former Research scientist (life sciences)  .  Smokeless tobacco: Never Used  . Alcohol Use: No   OB History    Gravida Para Term Preterm AB TAB SAB Ectopic Multiple Living   3 2 2  1  1   2      Review of Systems  Constitutional: Negative for fever and chills.  Respiratory: Negative for shortness of breath.   Cardiovascular: Negative for chest pain.  Gastrointestinal: Negative for nausea, vomiting, diarrhea and constipation.  Genitourinary: Negative for dysuria.  Neurological: Positive for facial asymmetry and numbness.  All other systems reviewed and are negative.     Allergies  Hydrocodone; Nitroglycerin; Tramadol; Dilaudid; Hydromorphone; Toradol; Levaquin; Levofloxacin; Sulfa antibiotics; Tape; and Trazodone and nefazodone  Home Medications   Prior to Admission medications   Medication Sig Start Date End Date Taking? Authorizing Provider  albuterol (PROVENTIL HFA;VENTOLIN HFA) 108 (90 BASE) MCG/ACT inhaler Inhale 2 puffs into the lungs every 4 (four) hours as needed for wheezing or shortness of breath. 07/19/13   Michiel Cowboy, MD  buPROPion (WELLBUTRIN XL) 300 MG 24 hr tablet Take 300 mg by mouth daily.     Historical Provider, MD  busPIRone (BUSPAR) 15 MG tablet Take 2 tablets (30 mg total) by mouth 2 (two) times daily. 08/15/13   Dixon Boos, MD  escitalopram (LEXAPRO) 20 MG tablet Take 20 mg by mouth 3 (three) times daily.     Historical Provider,  MD  hydrochlorothiazide (HYDRODIURIL) 25 MG tablet Take 25 mg by mouth daily.     Historical Provider, MD  lisinopril (PRINIVIL,ZESTRIL) 10 MG tablet Take 10 mg by mouth daily.     Historical Provider, MD  LORazepam (ATIVAN) 0.5 MG tablet Take 1 tablet (0.5 mg total) by mouth 2 (two) times daily as needed for anxiety. 08/20/15 08/19/16  Wandra Arthurs, MD  nystatin cream (MYCOSTATIN) Apply 1 application topically 2 (two) times daily as needed for dry skin.  03/03/15   Historical Provider, MD  oxyCODONE-acetaminophen (PERCOCET) 5-325 MG tablet Take 2 tablets by mouth every 6  (six) hours as needed for moderate pain or severe pain. 10/09/15   Earleen Newport, MD  predniSONE (STERAPRED UNI-PAK 21 TAB) 10 MG (21) TBPK tablet Take 1 tablet (10 mg total) by mouth daily. 10/09/15   Earleen Newport, MD  warfarin (COUMADIN) 10 MG tablet Take 10 mg by mouth daily.    Historical Provider, MD   BP 152/61 mmHg  Pulse 70  Temp(Src) 98.4 F (36.9 C) (Oral)  Resp 13  Ht 5\' 6"  (1.676 m)  Wt 196.09 kg  BMI 69.81 kg/m2  SpO2 98% Physical Exam  Constitutional: She is oriented to person, place, and time. She appears well-developed and well-nourished.  Morbidly obese  HENT:  Head: Normocephalic and atraumatic.  Eyes: Conjunctivae and EOM are normal. Pupils are equal, round, and reactive to light.  Neck: Normal range of motion. Neck supple.  Cardiovascular: Normal rate and regular rhythm.  Exam reveals no gallop and no friction rub.   No murmur heard. Pulmonary/Chest: Effort normal and breath sounds normal. No respiratory distress. She has no wheezes. She has no rales. She exhibits no tenderness.  Abdominal: Soft. Bowel sounds are normal. She exhibits no distension and no mass. There is no tenderness. There is no rebound and no guarding.  Musculoskeletal: Normal range of motion. She exhibits no edema or tenderness.  Moves all extremities  Neurological: She is alert and oriented to person, place, and time.  CN III-12 intact Possible slight left-sided facial droop Normal finger to nose Sensation and strength are equal and bilateral  Skin: Skin is warm and dry.  Psychiatric: She has a normal mood and affect. Her behavior is normal. Judgment and thought content normal.  Nursing note and vitals reviewed.   ED Course  Procedures (including critical care time) Labs Review Labs Reviewed  PROTIME-INR - Abnormal; Notable for the following:    Prothrombin Time 20.9 (*)    INR 1.80 (*)    All other components within normal limits  APTT - Abnormal; Notable for the  following:    aPTT 38 (*)    All other components within normal limits  CBC - Abnormal; Notable for the following:    Hemoglobin 11.8 (*)    All other components within normal limits  I-STAT CHEM 8, ED - Abnormal; Notable for the following:    BUN 21 (*)    Glucose, Bld 124 (*)    All other components within normal limits  DIFFERENTIAL  COMPREHENSIVE METABOLIC PANEL  I-STAT TROPOININ, ED  CBG MONITORING, ED    Imaging Review Ct Head Wo Contrast  11/14/2015  CLINICAL DATA:  Left-sided weakness and slurred speech. EXAM: CT HEAD WITHOUT CONTRAST TECHNIQUE: Contiguous axial images were obtained from the base of the skull through the vertex without intravenous contrast. COMPARISON:  08/08/2014 FINDINGS: There is no intracranial hemorrhage, mass or evidence of acute infarction. There is no extra-axial  fluid collection. Gray matter and white matter appear normal. Cerebral volume is normal for age. Brainstem and posterior fossa are unremarkable. The CSF spaces appear normal. No interval change from 08/08/2014 The bony structures are intact. The visible portions of the paranasal sinuses are clear. Visible orbits are unremarkable. IMPRESSION: Normal brain. Critical Value/emergent results were called by telephone at the time of interpretation on 11/14/2015 at 10:08 pm to Dr. Wallie Char, who verbally acknowledged these results. Electronically Signed   By: Andreas Newport M.D.   On: 11/14/2015 22:09   I have personally reviewed and evaluated these images and lab results as part of my medical decision-making.   EKG Interpretation   Date/Time:  Saturday November 14 2015 22:06:21 EDT Ventricular Rate:  80 PR Interval:  193 QRS Duration: 108 QT Interval:  382 QTC Calculation: 441 R Axis:   3 Text Interpretation:  Sinus rhythm Multiple ventricular premature  complexes Low voltage, precordial leads Since last tracing pvc new  Confirmed by Eulis Foster  MD, Vira Agar [id number]) on 11/14/2015 11:33:41 PM       MDM   Final diagnoses:  Facial droop    Patient with left-sided facial droop and left-sided weakness. Onset 1948.  Patient seen by neurology as part of code stroke. She is anticoagulated with Coumadin for prior DVT.  Patient seen by neurology, very minor symptoms on exam.  Neurology recommends admission for stroke workup.  No TPA per neurology.  Dr. Nicole Kindred recommends admission to hospitalist for stroke workup.  Appreciate Dr. Eulas Post for admitting the patient.   Montine Circle, PA-C 11/14/15 [id number]  Daleen Bo, MD 11/15/15 0001

## 2015-11-14 NOTE — ED Notes (Signed)
Per [facility]: Pt complaining of left sided facial droop and left sided weakness that started at [place]. Pt is able to correct left sided facial droop when smiling, no drift or droop noted with left arm. Pt does state that she has decreased sensation to left arm. Pt states that she has had one sided weakness in the past after being upset. Pt states that she was just arguing with family. Pt states that she feels like she has to concentrate on what she is saying. Pt is on coumadin for PE and DVT.

## 2015-11-14 NOTE — Consult Note (Signed)
Admission H&P    Chief Complaint: New onset left facial numbness and mild facial weakness.  HPI: Stacey [name] is an 55 y.o. female with a history of hypertension, diabetes mellitus, hyperlipidemia, DVT and pulmonary embolus, presenting to the ED in code stroke status following onset of numbness involving left side of the face and mild left facial droop. It's unclear if she actually experienced weakness of her left extremities. She indicated she had a similar spell one month ago but symptoms resolved and she did not seek medical attention. She has been on Coumadin for anticoagulation. INR was 1.8 area CT scan of her head showed no acute intracranial abnormality. She had residual subjective numbness involving left side of her face. Facial droop was noted in the ED.  LSN: 7:50 PM on the 15th 2017 tPA Given: No: Minimal residual deficit mRankin:  Past Medical History  Diagnosis Date  . Hypertension   . Depression   . Type 2 diabetes mellitus ([place])   . Diverticulitis   . Asthma   . Cyst     pt has "cyst" to legs for years, areas drain at time  . Pancreatitis   . Arthritis   . C. difficile colitis 11/08/2011  . Musculoskeletal pain 11/08/2011  . Neuropathic pain 11/10/2011  . Sleep apnea with use of continuous positive airway pressure (CPAP)     uses CPAP at night  . 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 DVT (deep venous thrombosis) ([place])   . Pulmonary embolism ([place])   . Hernia, inguinal     Past Surgical History  Procedure Laterality Date  . Tonsillectomy    . Cholecystectomy    . Cesarean section    . Nose surgery    . Skin biopsy      Family History  Problem Relation Age of Onset  . Stroke Other   . Cancer Other   . Diabetes Other    Social History:  reports that she has quit smoking. She has never used smokeless tobacco. She reports that she does not drink alcohol or use illicit drugs.  Allergies:  Allergies  Allergen Reactions  .  Hydrocodone Anaphylaxis  . Nitroglycerin Anxiety  . Tramadol Anaphylaxis and Swelling  . Hydromorphone Other (See Comments)    "hallucinations" has tolerated morphine, but "it doesn't work very well"  . Levaquin [Levofloxacin In D5w] Nausea And Vomiting  . Toradol [Ketorolac Tromethamine] Swelling  . Levofloxacin Other (See Comments)  . Sulfa Antibiotics Rash  . Tape Rash  . Trazodone And Nefazodone Other (See Comments)    Medications: Patient's preadmission medications were reviewed by me.  ROS: History obtained from the patient  General ROS: negative for - chills, fatigue, fever, night sweats, weight gain or weight loss Psychological ROS: negative for - behavioral disorder, hallucinations, memory difficulties, mood swings or suicidal ideation Ophthalmic ROS: negative for - blurry vision, double vision, eye pain or loss of vision ENT ROS: negative for - epistaxis, nasal discharge, oral lesions, sore throat, tinnitus or vertigo Allergy and Immunology ROS: negative for - hives or itchy/watery eyes Hematological and Lymphatic ROS: negative for - bleeding problems, bruising or swollen lymph nodes Endocrine ROS: negative for - galactorrhea, hair pattern changes, polydipsia/polyuria or temperature intolerance Respiratory ROS: negative for - cough, hemoptysis, shortness of breath or wheezing Cardiovascular ROS: negative for - chest pain, dyspnea on exertion, edema or irregular heartbeat Gastrointestinal ROS: negative for - abdominal pain, diarrhea, hematemesis, nausea/vomiting or stool incontinence Genito-Urinary ROS:  negative for - dysuria, hematuria, incontinence or urinary frequency/urgency Musculoskeletal ROS: negative for - joint swelling or muscular weakness Neurological ROS: as noted in HPI Dermatological ROS: negative for rash and skin lesion changes  Physical Examination: Blood pressure 153/54, pulse 76, temperature 98.4 F (36.9 C), temperature source Oral, resp. rate 13,  height _0  (1.676 m), weight 196.09 kg (432 lb 4.8 oz), SpO2 99 %.  HEENT-  Normocephalic, no lesions, without obvious abnormality.  Normal external eye and conjunctiva.  Normal TM's bilaterally.  Normal auditory canals and external ears. Normal external nose, mucus membranes and septum.  Normal pharynx. Neck supple with no masses, nodes, nodules or enlargement. Cardiovascular - regular rate and rhythm, S1, S2 normal, no murmur, click, rub or gallop Lungs - chest clear, no wheezing, rales, normal symmetric air entry Abdomen - soft, non-tender; bowel sounds normal; no masses,  no organomegaly Extremities - no joint deformities, effusion, or inflammation, moderate bilateral distal lower extremity edema.  Neurologic Examination: Mental Status: Alert, oriented, thought content appropriate.  Speech fluent without evidence of aphasia. Able to follow commands without difficulty. Cranial Nerves: II-Visual fields were normal. III/IV/VI-Pupils were equal and reacted normally to light. Extraocular movements were full and conjugate.    V/VII-mild numbness to tactile sensation over left side of the face compared to the right no facial weakness. VIII-normal. X-normal speech and symmetrical palatal movement. XI: trapezius strength/neck flexion strength normal bilaterally XII-midline tongue extension with normal strength. Motor: 5/5 bilaterally with normal tone and bulk Sensory: Normal throughout. Deep Tendon Reflexes: 2+ and symmetric. Plantars: Mute bilaterally Cerebellar: Normal finger-to-nose testing. Carotid auscultation: Normal  Results for orders placed or performed during the hospital encounter of 11/14/15 (from the past 48 hour(s))  Protime-INR     Status: Abnormal   Collection Time: 11/14/15  9:46 PM  Result Value Ref Range   Prothrombin Time 20.9 (H) 11.6 - 15.2 seconds   INR 1.80 (H) 0.00 - 1.49  APTT     Status: Abnormal   Collection Time: 11/14/15  9:46 PM  Result Value Ref Range    aPTT 38 (H) 24 - 37 seconds    Comment:        IF BASELINE aPTT IS ELEVATED, SUGGEST PATIENT RISK ASSESSMENT BE USED TO DETERMINE APPROPRIATE ANTICOAGULANT THERAPY.   CBC     Status: Abnormal   Collection Time: 11/14/15  9:46 PM  Result Value Ref Range   WBC 8.7 4.0 - 10.5 K/uL   RBC 4.25 3.87 - 5.11 MIL/uL   Hemoglobin 11.8 (L) 12.0 - 15.0 g/dL   HCT 37.2 36.0 - 46.0 %   MCV 87.5 78.0 - 100.0 fL   MCH 27.8 26.0 - 34.0 pg   MCHC 31.7 30.0 - 36.0 g/dL   RDW 13.7 11.5 - 15.5 %   Platelets 256 150 - 400 K/uL  Differential     Status: None   Collection Time: 11/14/15  9:46 PM  Result Value Ref Range   Neutrophils Relative % 62 %   Neutro Abs 5.5 1.7 - 7.7 K/uL   Lymphocytes Relative 29 %   Lymphs Abs 2.5 0.7 - 4.0 K/uL   Monocytes Relative 7 %   Monocytes Absolute 0.6 0.1 - 1.0 K/uL   Eosinophils Relative 2 %   Eosinophils Absolute 0.2 0.0 - 0.7 K/uL   Basophils Relative 0 %   Basophils Absolute 0.0 0.0 - 0.1 K/uL  Comprehensive metabolic panel     Status: Abnormal   Collection Time: 11/14/15  9:46 PM  Result Value Ref Range   Sodium 137 135 - 145 mmol/L   Potassium 4.0 3.5 - 5.1 mmol/L   Chloride 104 101 - 111 mmol/L   CO2 23 22 - 32 mmol/L   Glucose, Bld 127 (H) 65 - 99 mg/dL   BUN 19 6 - 20 mg/dL   Creatinine, Ser 0.94 0.44 - 1.00 mg/dL   Calcium 9.4 8.9 - 10.3 mg/dL   Total Protein 6.9 6.5 - 8.1 g/dL   Albumin 3.6 3.5 - 5.0 g/dL   AST 20 15 - 41 U/L   ALT 22 14 - 54 U/L   Alkaline Phosphatase 54 38 - 126 U/L   Total Bilirubin 0.5 0.3 - 1.2 mg/dL   GFR calc non Af Amer >60 >60 mL/min   GFR calc Af Amer >60 >60 mL/min    Comment: (NOTE) The eGFR has been calculated using the CKD EPI equation. This calculation has not been validated in all clinical situations. eGFR's persistently <60 mL/min signify possible Chronic Kidney Disease.    Anion gap 10 5 - 15  I-stat troponin, ED (not at [facility], [facility])     Status: None   Collection Time: 11/14/15  9:52 PM  Result  Value Ref Range   Troponin i, poc 0.01 0.00 - 0.08 ng/mL   Comment 3            Comment: Due to the release kinetics of cTnI, a negative result within the first hours of the onset of symptoms does not rule out myocardial infarction with certainty. If myocardial infarction is still suspected, repeat the test at appropriate intervals.   I-Stat Chem 8, ED  (not at [facility], [facility])     Status: Abnormal   Collection Time: 11/14/15  9:53 PM  Result Value Ref Range   Sodium 141 135 - 145 mmol/L   Potassium 4.2 3.5 - 5.1 mmol/L   Chloride 106 101 - 111 mmol/L   BUN 21 (H) 6 - 20 mg/dL   Creatinine, Ser 0.90 0.44 - 1.00 mg/dL   Glucose, Bld 124 (H) 65 - 99 mg/dL   Calcium, Ion 1.14 1.12 - 1.23 mmol/L   TCO2 23 0 - 100 mmol/L   Hemoglobin 12.6 12.0 - 15.0 g/dL   HCT 37.0 36.0 - 46.0 %  CBG monitoring, ED     Status: Abnormal   Collection Time: 11/14/15 10:22 PM  Result Value Ref Range   Glucose-Capillary 112 (H) 65 - 99 mg/dL   Ct Head Wo Contrast  11/14/2015  CLINICAL DATA:  Left-sided weakness and slurred speech. EXAM: CT HEAD WITHOUT CONTRAST TECHNIQUE: Contiguous axial images were obtained from the base of the skull through the vertex without intravenous contrast. COMPARISON:  08/08/2014 FINDINGS: There is no intracranial hemorrhage, mass or evidence of acute infarction. There is no extra-axial fluid collection. Gray matter and white matter appear normal. Cerebral volume is normal for age. Brainstem and posterior fossa are unremarkable. The CSF spaces appear normal. No interval change from 08/08/2014 The bony structures are intact. The visible portions of the paranasal sinuses are clear. Visible orbits are unremarkable. IMPRESSION: Normal brain. Critical Value/emergent results were called by telephone at the time of interpretation on 11/14/2015 at 10:08 pm to Dr. Wallie Char, who verbally acknowledged these results. Electronically Signed   By: Andreas Newport M.D.   On: 11/14/2015 22:09     Assessment: 55 y.o. female with multiple risk factors for stroke presenting with possible right subcortical small vessel TIA. Acute  ischemic stroke cannot be ruled out at this point, however.  Stroke Risk Factors - diabetes mellitus, family history, hyperlipidemia and hypertension  Plan: 1. HgbA1c, fasting lipid panel 2. MRI, MRA  of the brain without contrast 3. PT consult, OT consult, Speech consult 4. Echocardiogram 5. Carotid dopplers 6. Prophylactic therapy-Anticoagulation: Coumadin 7. Risk factor modification 8. Telemetry monitoring  C.R. Nicole Kindred, MD [facility] [phone]  11/14/2015, 10:56 PM

## 2015-11-14 NOTE — ED Notes (Signed)
Pt's CBG=112 

## 2015-11-14 NOTE — Progress Notes (Addendum)
Code Stroke called on 55 y.o female. LSN 1948. 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 Pertinent history includes HTN, DM, Depression, Morbid Obesity, DVT, PE. Following argument with family she developed left arm weakness and facial droop. No arm weakness assessed per EMS. She indicated that she had a similar spell one month ago but symptoms resolved and she did not seek medical attention.  Labs drawn and Pt evaluated upon arrival to [facility]. Taken to CT scan STAT. Per Neurologist Dr. Nicole Kindred no acute abnormality. CBG 112. NIHSS completed yielding 1 for facial sensory deficit on left side. No TPA at this time due to Patient mild symptoms. She will remain with TPA treatment window until 0020 per Neurologist.

## 2015-11-15 ENCOUNTER — Encounter (HOSPITAL_COMMUNITY): Payer: Self-pay | Admitting: *Deleted

## 2015-11-15 ENCOUNTER — Observation Stay (HOSPITAL_COMMUNITY): Payer: Medicaid Other

## 2015-11-15 ENCOUNTER — Encounter (HOSPITAL_COMMUNITY): Payer: Medicaid Other

## 2015-11-15 DIAGNOSIS — I10 Essential (primary) hypertension: Secondary | ICD-10-CM | POA: Diagnosis not present

## 2015-11-15 DIAGNOSIS — E119 Type 2 diabetes mellitus without complications: Secondary | ICD-10-CM | POA: Diagnosis not present

## 2015-11-15 DIAGNOSIS — G459 Transient cerebral ischemic attack, unspecified: Secondary | ICD-10-CM | POA: Diagnosis present

## 2015-11-15 DIAGNOSIS — G458 Other transient cerebral ischemic attacks and related syndromes: Secondary | ICD-10-CM

## 2015-11-15 LAB — LIPID PANEL
CHOLESTEROL: 260 mg/dL — AB (ref 0–200)
HDL: 44 mg/dL (ref >40)
LDL Cholesterol: 163 mg/dL — ABNORMAL HIGH (ref 0–99)
TRIGLYCERIDES: 263 mg/dL — AB (ref <150)
Total CHOL/HDL Ratio: 5.9 RATIO
VLDL: 53 mg/dL — AB (ref 0–40)

## 2015-11-15 LAB — GLUCOSE, CAPILLARY
GLUCOSE-CAPILLARY: 122 mg/dL — AB (ref 65–99)
GLUCOSE-CAPILLARY: 130 mg/dL — AB (ref 65–99)
GLUCOSE-CAPILLARY: 135 mg/dL — AB (ref 65–99)
Glucose-Capillary: 97 mg/dL (ref 65–99)

## 2015-11-15 LAB — PROTIME-INR
INR: 2.04 — AB (ref 0.00–1.49)
Prothrombin Time: 22.9 seconds — ABNORMAL HIGH (ref 11.6–15.2)

## 2015-11-15 MED ORDER — INSULIN ASPART 100 UNIT/ML ~~LOC~~ SOLN
0.0000 [IU] | Freq: Three times a day (TID) | SUBCUTANEOUS | Status: DC
  Administered 2015-11-15 – 2015-11-16 (×4): 3 [IU] via SUBCUTANEOUS

## 2015-11-15 MED ORDER — ESCITALOPRAM OXALATE 10 MG PO TABS
10.0000 mg | ORAL_TABLET | Freq: Two times a day (BID) | ORAL | Status: DC
  Administered 2015-11-15 – 2015-11-17 (×5): 10 mg via ORAL
  Filled 2015-11-15 (×5): qty 1

## 2015-11-15 MED ORDER — WARFARIN SODIUM 5 MG PO TABS
5.0000 mg | ORAL_TABLET | Freq: Once | ORAL | Status: AC
  Administered 2015-11-15: 5 mg via ORAL
  Filled 2015-11-15: qty 1

## 2015-11-15 MED ORDER — STROKE: EARLY STAGES OF RECOVERY BOOK
Freq: Once | Status: AC
  Administered 2015-11-15: 02:00:00
  Filled 2015-11-15: qty 1

## 2015-11-15 MED ORDER — OXYCODONE-ACETAMINOPHEN 5-325 MG PO TABS
2.0000 | ORAL_TABLET | Freq: Four times a day (QID) | ORAL | Status: DC | PRN
  Administered 2015-11-15: 2 via ORAL
  Filled 2015-11-15: qty 2

## 2015-11-15 MED ORDER — WARFARIN - PHARMACIST DOSING INPATIENT: Freq: Every day | Status: DC

## 2015-11-15 MED ORDER — WARFARIN SODIUM 5 MG PO TABS: 10.0000 mg | ORAL_TABLET | Freq: Every morning | ORAL | Status: DC

## 2015-11-15 MED ORDER — WARFARIN SODIUM 5 MG PO TABS
10.0000 mg | ORAL_TABLET | Freq: Once | ORAL | Status: AC
  Administered 2015-11-15: 10 mg via ORAL
  Filled 2015-11-15: qty 2

## 2015-11-15 MED ORDER — LISINOPRIL 10 MG PO TABS
10.0000 mg | ORAL_TABLET | Freq: Every day | ORAL | Status: DC
  Administered 2015-11-15 – 2015-11-17 (×2): 10 mg via ORAL
  Filled 2015-11-15 (×3): qty 1

## 2015-11-15 MED ORDER — NYSTATIN 100000 UNIT/GM EX CREA
1.0000 "application " | TOPICAL_CREAM | Freq: Two times a day (BID) | CUTANEOUS | Status: DC | PRN
  Filled 2015-11-15: qty 15

## 2015-11-15 MED ORDER — ACETAMINOPHEN 650 MG RE SUPP: 650.0000 mg | RECTAL | Status: DC | PRN

## 2015-11-15 MED ORDER — ATORVASTATIN CALCIUM 80 MG PO TABS
80.0000 mg | ORAL_TABLET | Freq: Every day | ORAL | Status: DC
  Administered 2015-11-16 – 2015-11-17 (×2): 80 mg via ORAL
  Filled 2015-11-15 (×2): qty 1

## 2015-11-15 MED ORDER — PROMETHAZINE HCL 25 MG PO TABS
25.0000 mg | ORAL_TABLET | Freq: Four times a day (QID) | ORAL | Status: DC | PRN
Start: 2015-11-15 — End: 2015-11-17

## 2015-11-15 MED ORDER — ACETAMINOPHEN 325 MG PO TABS
650.0000 mg | ORAL_TABLET | ORAL | Status: DC | PRN
Start: 2015-11-15 — End: 2015-11-17
  Administered 2015-11-15 – 2015-11-16 (×2): 650 mg via ORAL
  Filled 2015-11-15 (×2): qty 2

## 2015-11-15 MED ORDER — LORAZEPAM 1 MG PO TABS
2.0000 mg | ORAL_TABLET | Freq: Three times a day (TID) | ORAL | Status: DC | PRN
  Administered 2015-11-15 – 2015-11-16 (×2): 2 mg via ORAL
  Filled 2015-11-15 (×2): qty 2

## 2015-11-15 MED ORDER — BUSPIRONE HCL 10 MG PO TABS
30.0000 mg | ORAL_TABLET | Freq: Two times a day (BID) | ORAL | Status: DC
  Administered 2015-11-15 – 2015-11-17 (×5): 30 mg via ORAL
  Filled 2015-11-15 (×5): qty 3

## 2015-11-15 MED ORDER — ATORVASTATIN CALCIUM 40 MG PO TABS
40.0000 mg | ORAL_TABLET | Freq: Every day | ORAL | Status: DC
  Administered 2015-11-15: 40 mg via ORAL
  Filled 2015-11-15: qty 1

## 2015-11-15 MED ORDER — BUPROPION HCL ER (XL) 150 MG PO TB24
300.0000 mg | ORAL_TABLET | Freq: Every day | ORAL | Status: DC
  Administered 2015-11-15 – 2015-11-17 (×3): 300 mg via ORAL
  Filled 2015-11-15 (×3): qty 2

## 2015-11-15 MED ORDER — HYDROCHLOROTHIAZIDE 25 MG PO TABS
25.0000 mg | ORAL_TABLET | Freq: Every day | ORAL | Status: DC
  Administered 2015-11-15 – 2015-11-16 (×2): 25 mg via ORAL
  Filled 2015-11-15 (×4): qty 1

## 2015-11-15 NOTE — Progress Notes (Addendum)
Patient refuses SCDs, states she had a doctor tell her to not use them due to clot in right leg. States this was over a year ago. Neuro pa paged. Attending was notified.  MD aware of patient's refusal of scds. Patient is on coumadin

## 2015-11-15 NOTE — Progress Notes (Signed)
STROKE TEAM PROGRESS NOTE   HISTORY OF PRESENT ILLNESS Stacey [name] is an 55 y.o. female with a history of hypertension, diabetes mellitus, hyperlipidemia, DVT and pulmonary embolus, presenting to the ED in code stroke status following onset of numbness involving left side of the face and mild left facial droop. It's unclear if she actually experienced weakness of her left extremities. She indicated she had a similar spell one month ago but symptoms resolved and she did not seek medical attention. She has been on Coumadin for anticoagulation. INR was 1.8 area CT scan of her head showed no acute intracranial abnormality. She had residual subjective numbness involving left side of her face. Facial droop was noted in the ED.  LSN: 7:50 PM on the 15th 2017 tPA Given: No: Minimal residual deficit mRankin:   SUBJECTIVE (INTERVAL HISTORY) The patient is anxious to go home. She feels back to baseline. She was willing to stay for the complete workup.   OBJECTIVE Temp:  [97.7 F (36.5 C)-98.4 F (36.9 C)] 97.9 F (36.6 C) (04/16 0700) Pulse Rate:  [70-88] 76 (04/16 0700) Cardiac Rhythm:  [-] Normal sinus rhythm (04/16 0132) Resp:  [13-26] 18 (04/16 0700) BP: (141-168)/(44-74) 160/63 mmHg (04/16 0700) SpO2:  [96 %-100 %] 99 % (04/16 0700) Weight:  [196.09 kg (432 lb 4.8 oz)] 196.09 kg (432 lb 4.8 oz) (04/15 2212)  CBC:  Recent Labs Lab 11/14/15 2146 11/14/15 2153  WBC 8.7  --   NEUTROABS 5.5  --   HGB 11.8* 12.6  HCT 37.2 37.0  MCV 87.5  --   PLT 256  --     Basic Metabolic Panel:  Recent Labs Lab 11/14/15 2146 11/14/15 2153  NA 137 141  K 4.0 4.2  CL 104 106  CO2 23  --   GLUCOSE 127* 124*  BUN 19 21*  CREATININE 0.94 0.90  CALCIUM 9.4  --     Lipid Panel:    Component Value Date/Time   CHOL 260* 11/15/2015 0500   TRIG 263* 11/15/2015 0500   HDL 44 11/15/2015 0500   CHOLHDL 5.9 11/15/2015 0500   VLDL 53* 11/15/2015 0500   LDLCALC 163* 11/15/2015 0500   HgbA1c:   Lab Results  Component Value Date   HGBA1C 6.1* 06/01/2013   Urine Drug Screen:    Component Value Date/Time   LABOPIA NONE DETECTED 08/20/2015 0903   LABBENZ NONE DETECTED 08/20/2015 0903   AMPHETMU NONE DETECTED 08/20/2015 0903   THCU NONE DETECTED 08/20/2015 0903   LABBARB NONE DETECTED 08/20/2015 0903      IMAGING  Ct Head Wo Contrast 11/14/2015   Normal brain.    PHYSICAL EXAM  Neurologic Examination:  Mental Status:  Alert, oriented, thought content appropriate. Speech without evidence of dysarthria or aphasia. Able to follow 3 step commands without difficulty.  Cranial Nerves:  II-bilateral visual fields intact III/IV/VI-Pupils were equal and reacted. Extraocular movements were full.  V/VII-no facial numbness and no facial weakness.  VIII-hearing normal.  X-normal speech and symmetrical palatal movement.  XII-midline tongue extension  Motor: 5/5 strength symmetrical throughout.  Muscle tone normal throughout. Sensory: Intact to light touch in all extremities. Deep Tendon Reflexes: 2/4 throughout Plantars: Downgoing bilaterally  Cerebellar: Normal finger to nose and heel to shin bilaterally. Gait: not tested    ASSESSMENT/PLAN Stacey [name] is a 55 y.o. female with history of hypertension, depression, diabetes mellitus, previous pancreatitis, previous C. difficile colitis, neuropathy, history of DVT, history of pulmonary embolus, chronic Coumadin  therapy presenting with left facial weakness and numbness. She did not receive IV t-PA due to minimal deficits  Possible TIA - nondominant  Resultant  improvement in deficits  MRI  the patient would not fit into the MRI scanner  MRA  not performed  Carotid Doppler  pending  2D Echo pending  LDL 163  HgbA1c pending  VTE prophylaxis -warfarin INR 2.04 today  Diet Carb Modified Fluid consistency:: Thin; Room service appropriate?: Yes  warfarin daily prior to admission, now on warfarin  daily  Patient counseled to be compliant with her antithrombotic medications  Ongoing aggressive stroke risk factor management  Therapy recommendations:  Pending  Disposition:  Pending  Hypertension  Stable  Permissive hypertension (OK if < 220/120) but gradually normalize in 5-7 days  Hyperlipidemia  Home meds: Lipitor 40 mg daily prior to admission-- resumed in hospital  LDL 163, goal < 70  Increase Lipitor to 80 mg daily and injured her compliance  Continue statin at discharge  Diabetes  HgbA1c pending, goal < 7.0  Uncontrolled  Other Stroke Risk Factors  Obesity, Body mass index is 69.81 kg/(m^2).   Family hx stroke    Other Active Problems  Contrast allergy -> nausea and vomiting  The patient is too large for the MRI - repeat head CT in a.m.  Pharmacy dosing Coumadin  Hospital day # 1  [name] Pager [phone] 11/15/2015, 2:37 PM  Unable to fit into MRI Plan is to repeat [place] in AM [facility], Stacey [name]    To contact Stroke Continuity provider, please refer to [email or web address] After hours, contact General Neurology

## 2015-11-15 NOTE — H&P (Signed)
[facility] History and Physical  Stacey [name] [id number] DOB: 03-Sep-1960 DOA: 11/14/2015  PCP: Alvester Chou, NP   Chief Complaint: left facial droop, left arm weakness  HPI: Stacey [name] is a 55 y.o. woman with a history of HTN, DM, and morbid obesity who presented to the ED via EMS for evaluation of acute onset of left facial droop and left arm weakness, started around 8pm.  She was upset at the time.  Her daughter-in-law thought she also had slurred speech, and called 911.  Code Stroke was called upon arrival here.  CT head without contrast was negative for acute process.  Neurology saw the patient in the ED.  Hospitalist asked to admit for stroke evaluation.  She denies chest pain, headache, vision disturbance, nausea, vomiting, or changes in bowel or bladder habits.  Review of Systems: 12 systems reviewed and negative except as stated in the HPI.  Past Medical History  Diagnosis Date  . Hypertension   . Depression   . Type 2 diabetes mellitus ([place])   . Diverticulitis   . Asthma   . Cyst     pt has "cyst" to legs for years, areas drain at time  . Pancreatitis   . Arthritis   . C. difficile colitis 11/08/2011  . Musculoskeletal pain 11/08/2011  . Neuropathic pain 11/10/2011  . Sleep apnea with use of continuous positive airway pressure (CPAP)     uses CPAP at night  . 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 DVT (deep venous thrombosis) ([place])   . Pulmonary embolism ([place])   . Hernia, inguinal    Past Surgical History  Procedure Laterality Date  . Tonsillectomy    . Cholecystectomy    . Cesarean section    . Nose surgery    . Skin biopsy     Social History:  Social History   Social History Narrative  No tobacco, EtOH, or illicit drug use.  She is not married.  She has two children.   Allergies  Allergen Reactions  . Hydrocodone Anaphylaxis  . Nitroglycerin Anxiety  . Tramadol Anaphylaxis and Swelling  . Hydromorphone Other  (See Comments)    "hallucinations" has tolerated morphine, but "it doesn't work very well"  . Levaquin [Levofloxacin In D5w] Nausea And Vomiting  . Toradol [Ketorolac Tromethamine] Swelling  . Levofloxacin Other (See Comments)  . Sulfa Antibiotics Rash  . Tape Rash  . Trazodone And Nefazodone Other (See Comments)    Family History  Problem Relation Age of Onset  . Stroke Other   . Cancer Other   . Diabetes Other    Prior to Admission medications   Medication Sig Start Date End Date Taking? Authorizing Provider  albuterol (PROVENTIL HFA;VENTOLIN HFA) 108 (90 BASE) MCG/ACT inhaler Inhale 2 puffs into the lungs every 4 (four) hours as needed for wheezing or shortness of breath. 07/19/13  Yes Michiel Cowboy, MD  buPROPion (WELLBUTRIN XL) 300 MG 24 hr tablet Take 300 mg by mouth daily.    Yes Historical Provider, MD  busPIRone (BUSPAR) 15 MG tablet Take 2 tablets (30 mg total) by mouth 2 (two) times daily. 08/15/13  Yes Dixon Boos, MD  escitalopram (LEXAPRO) 20 MG tablet Take 10 mg by mouth 2 (two) times daily.    Yes Historical Provider, MD  lisinopril (PRINIVIL,ZESTRIL) 10 MG tablet Take 10 mg by mouth daily.    Yes Historical Provider, MD  LORazepam (ATIVAN) 2 MG tablet Take 2 mg by  mouth 3 (three) times daily as needed. 11/02/15  Yes Historical Provider, MD  nystatin cream (MYCOSTATIN) Apply 1 application topically 2 (two) times daily as needed for dry skin.  03/03/15  Yes Historical Provider, MD  oxyCODONE-acetaminophen (PERCOCET) 5-325 MG tablet Take 2 tablets by mouth every 6 (six) hours as needed for moderate pain or severe pain. 10/09/15  Yes Earleen Newport, MD  warfarin (COUMADIN) 10 MG tablet Take 10 mg by mouth every morning.    Yes Historical Provider, MD  atorvastatin (LIPITOR) 40 MG tablet Take 40 mg by mouth daily. Reported on 11/14/2015 08/31/15   Historical Provider, MD  hydrochlorothiazide (HYDRODIURIL) 25 MG tablet Take 25 mg by mouth daily. Reported on 11/14/2015     Historical Provider, MD   Physical Exam: Filed Vitals:   11/15/15 0045 11/15/15 0100 11/15/15 0134 11/15/15 0312  BP: 148/72 147/65 142/57 142/55  Pulse: 80 79 81 70  Temp:   98.1 F (36.7 C) 97.9 F (36.6 C)  TempSrc:   Oral Oral  Resp: 26 15 18 16   Height:      Weight:      SpO2: 97% 98% 98% 100%     General:  Awake and alert.  Oriented to person, place, time and situation.  NAD.  Head: [place]/AT  Eyes: pupils equal bilaterally, EOMI intact.  ENT: Mucous membranes are moist.  No nasal drainage.  Neck is supple.  No carotid bruit.  Cardiovascular: NR/RR.  Trace edema.  Respiratory: CTA bilaterally.  GI: Super obese abdomen.  Nontender.  Bowel sounds present.  Skin: Warm and dry.  Musculoskeletal: Moves all four extremities spontaneously.  Psychiatric: Normal affect.  Neurologic: Still has subtle facial droop.  Strength symmetric bilaterally in upper and lower extremities.  Labs on Admission:  Basic Metabolic Panel:  Recent Labs Lab 11/14/15 2146 11/14/15 2153  NA 137 141  K 4.0 4.2  CL 104 106  CO2 23  --   GLUCOSE 127* 124*  BUN 19 21*  CREATININE 0.94 0.90  CALCIUM 9.4  --    Liver Function Tests:  Recent Labs Lab 11/14/15 2146  AST 20  ALT 22  ALKPHOS 54  BILITOT 0.5  PROT 6.9  ALBUMIN 3.6   CBC:  Recent Labs Lab 11/14/15 2146 11/14/15 2153  WBC 8.7  --   NEUTROABS 5.5  --   HGB 11.8* 12.6  HCT 37.2 37.0  MCV 87.5  --   PLT 256  --    BNP (last 3 results)  Recent Labs  10/09/15 0911  BNP 10.0   CBG:  Recent Labs Lab 11/14/15 2222  GLUCAP 112*    Radiological Exams on Admission: Ct Head Wo Contrast  11/14/2015  CLINICAL DATA:  Left-sided weakness and slurred speech. EXAM: CT HEAD WITHOUT CONTRAST TECHNIQUE: Contiguous axial images were obtained from the base of the skull through the vertex without intravenous contrast. COMPARISON:  08/08/2014 FINDINGS: There is no intracranial hemorrhage, mass or evidence of acute  infarction. There is no extra-axial fluid collection. Gray matter and white matter appear normal. Cerebral volume is normal for age. Brainstem and posterior fossa are unremarkable. The CSF spaces appear normal. No interval change from 08/08/2014 The bony structures are intact. The visible portions of the paranasal sinuses are clear. Visible orbits are unremarkable. IMPRESSION: Normal brain. Critical Value/emergent results were called by telephone at the time of interpretation on 11/14/2015 at 10:08 pm to Dr. Wallie Char, who verbally acknowledged these results. Electronically Signed   By: Quillian Quince  Armandina Stammer M.D.   On: 11/14/2015 22:09    EKG: Independently reviewed. NSR, PVCs  Assessment/Plan Principal Problem:   Facial droop Active Problems:   Morbid obesity (HCC)   HTN (hypertension)   Diabetes mellitus type II, controlled ([place])   Admit to telemetry  Facial droop and LUE weakness concerning for acute CVA --Neurology consult pending --CVA eval per protocol (and as outlined in their H/P) --If patient unable to handle MRI, I expect she will have to get CTA head and neck.  She reports that IV contrast causes nausea/vomiting, and she will need phenergan 30 minutes prior to contrast exposure.  HTN --Continue home medications  Code Status: FULL Family Communication: patient alone at time of admission Disposition Plan: Observation status for now, pending further work-up  Time spent: 40 minutes  [employer] [facility]  11/15/2015, 3:22 AM

## 2015-11-15 NOTE — Progress Notes (Signed)
Patient requested cpap at night, order already placed by physician, respiratory notified and aware that patient requesting cpap for tonight

## 2015-11-15 NOTE — Progress Notes (Signed)
Pt seen and examined at bedside, admitted after midnight for evaluation of left sided facial numbness and weakness. Admitted for stroke vs TIA work up. Neuro consulted, pt could not fit into MRI.   Faye Ramsay, MD  [facility] Pager [phone]  If 7PM-7AM, please contact night-coverage [email or web address] Password [email or web address]

## 2015-11-15 NOTE — Progress Notes (Signed)
ANTICOAGULATION CONSULT NOTE - Initial Consult  Pharmacy Consult for Warfarin  Indication: VTE  Allergies  Allergen Reactions  . Hydrocodone Anaphylaxis  . Nitroglycerin Anxiety  . Tramadol Anaphylaxis and Swelling  . Hydromorphone Other (See Comments)    "hallucinations" has tolerated morphine, but "it doesn't work very well"  . Levaquin [Levofloxacin In D5w] Nausea And Vomiting  . Toradol [Ketorolac Tromethamine] Swelling  . Levofloxacin Other (See Comments)  . Sulfa Antibiotics Rash  . Tape Rash  . Trazodone And Nefazodone Other (See Comments)   Patient Measurements: Height: 5\' 6"  (167.6 cm) Weight: (!) 432 lb 4.8 oz (196.09 kg) IBW/kg (Calculated) : 59.3  Vital Signs: Temp: 98.1 F (36.7 C) (04/16 0134) Temp Source: Oral (04/16 0134) BP: 142/57 mmHg (04/16 0134) Pulse Rate: 81 (04/16 0134)  Labs:  Recent Labs  11/14/15 2146 11/14/15 2153  HGB 11.8* 12.6  HCT 37.2 37.0  PLT 256  --   APTT 38*  --   LABPROT 20.9*  --   INR 1.80*  --   CREATININE 0.94 0.90    Medical History: Past Medical History  Diagnosis Date  . Hypertension   . Depression   . Type 2 diabetes mellitus ([place])   . Diverticulitis   . Asthma   . Cyst     pt has "cyst" to legs for years, areas drain at time  . Pancreatitis   . Arthritis   . C. difficile colitis 11/08/2011  . Musculoskeletal pain 11/08/2011  . Neuropathic pain 11/10/2011  . Sleep apnea with use of continuous positive airway pressure (CPAP)     uses CPAP at night  . 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 DVT (deep venous thrombosis) ([place])   . Pulmonary embolism ([place])   . Hernia, inguinal    Assessment: 55 y/o F on chronic warfarin therapy for DVT/PE, INR is 1.8 on admit, came in as code stroke but CT was normal in the ED.   Outpatient warfarin dose 10 mg daily (last dose 4/15)  Goal of Therapy:  INR 2-3 Monitor platelets by anticoagulation protocol: Yes   Plan:  -Give an extra 5 mg of  warfarin now and give PTA dose of 10 mg tonight at 1800 -Daily PT/INR, adjust dose as needed -Monitor for bleeding  Narda Bonds 11/15/2015,2:26 AM

## 2015-11-16 ENCOUNTER — Observation Stay (HOSPITAL_COMMUNITY): Payer: Medicaid Other

## 2015-11-16 DIAGNOSIS — R29810 Facial weakness: Secondary | ICD-10-CM | POA: Diagnosis not present

## 2015-11-16 DIAGNOSIS — G458 Other transient cerebral ischemic attacks and related syndromes: Secondary | ICD-10-CM | POA: Diagnosis not present

## 2015-11-16 DIAGNOSIS — I10 Essential (primary) hypertension: Secondary | ICD-10-CM | POA: Diagnosis not present

## 2015-11-16 LAB — GLUCOSE, CAPILLARY
GLUCOSE-CAPILLARY: 132 mg/dL — AB (ref 65–99)
GLUCOSE-CAPILLARY: 86 mg/dL (ref 65–99)
GLUCOSE-CAPILLARY: 130 mg/dL — AB (ref 65–99)
Glucose-Capillary: 126 mg/dL — ABNORMAL HIGH (ref 65–99)
Glucose-Capillary: 109 mg/dL — ABNORMAL HIGH (ref 65–99)

## 2015-11-16 LAB — PROTIME-INR
INR: 2.15 — AB (ref 0.00–1.49)
PROTHROMBIN TIME: 23.9 s — AB (ref 11.6–15.2)

## 2015-11-16 LAB — CBC
HCT: 38.6 % (ref 36.0–46.0)
Hemoglobin: 12 g/dL (ref 12.0–15.0)
MCH: 27.6 pg (ref 26.0–34.0)
MCHC: 31.1 g/dL (ref 30.0–36.0)
MCV: 88.7 fL (ref 78.0–100.0)
PLATELETS: 259 10*3/uL (ref 150–400)
RBC: 4.35 MIL/uL (ref 3.87–5.11)
RDW: 13.7 % (ref 11.5–15.5)
WBC: 8 10*3/uL (ref 4.0–10.5)

## 2015-11-16 LAB — BASIC METABOLIC PANEL
Anion gap: 10 (ref 5–15)
BUN: 16 mg/dL (ref 6–20)
CO2: 26 mmol/L (ref 22–32)
Calcium: 9.8 mg/dL (ref 8.9–10.3)
Chloride: 103 mmol/L (ref 101–111)
Creatinine, Ser: 0.96 mg/dL (ref 0.44–1.00)
GFR calc Af Amer: >60 mL/min (ref >60)
GFR calc non Af Amer: >60 mL/min (ref >60)
Glucose, Bld: 128 mg/dL — ABNORMAL HIGH (ref 65–99)
POTASSIUM: 4.4 mmol/L (ref 3.5–5.1)
SODIUM: 139 mmol/L (ref 135–145)

## 2015-11-16 LAB — HEMOGLOBIN A1C
Hgb A1c MFr Bld: 6 % — ABNORMAL HIGH (ref 4.8–5.6)
MEAN PLASMA GLUCOSE: 126 mg/dL

## 2015-11-16 MED ORDER — WARFARIN SODIUM 5 MG PO TABS
10.0000 mg | ORAL_TABLET | Freq: Every day | ORAL | Status: DC
  Administered 2015-11-16: 10 mg via ORAL
  Filled 2015-11-16: qty 2

## 2015-11-16 MED ORDER — POLYETHYLENE GLYCOL 3350 17 G PO PACK
17.0000 g | PACK | Freq: Every day | ORAL | Status: DC
  Administered 2015-11-16: 17 g via ORAL
  Filled 2015-11-16 (×2): qty 1

## 2015-11-16 MED ORDER — SENNOSIDES-DOCUSATE SODIUM 8.6-50 MG PO TABS
1.0000 | ORAL_TABLET | Freq: Two times a day (BID) | ORAL | Status: DC
  Administered 2015-11-16 – 2015-11-17 (×3): 1 via ORAL
  Filled 2015-11-16 (×3): qty 1

## 2015-11-16 NOTE — Progress Notes (Signed)
[place] for Warfarin  Indication: VTE  Allergies  Allergen Reactions  . Hydrocodone Anaphylaxis  . Nitroglycerin Anxiety  . Tramadol Anaphylaxis and Swelling  . Hydromorphone Other (See Comments)    "hallucinations" has tolerated morphine, but "it doesn't work very well"  . Levaquin [Levofloxacin In D5w] Nausea And Vomiting  . Toradol [Ketorolac Tromethamine] Swelling  . Levofloxacin Other (See Comments)  . Sulfa Antibiotics Rash  . Tape Rash  . Trazodone And Nefazodone Other (See Comments)   Patient Measurements: Height: 5\' 6"  (167.6 cm) Weight: (!) 432 lb 4.8 oz (196.09 kg) IBW/kg (Calculated) : 59.3  Vital Signs: Temp: 98.4 [name] (36.9 C) (04/17 1020) Temp Source: Oral (04/17 1020) BP: 137/44 mmHg (04/17 1020) Pulse Rate: 73 (04/17 1020)  Labs:  Recent Labs  11/14/15 2146 11/14/15 2153 11/15/15 1111 11/16/15 0442  HGB 11.8* 12.6  --  12.0  HCT 37.2 37.0  --  38.6  PLT 256  --   --  259  APTT 38*  --   --   --   LABPROT 20.9*  --  22.9* 23.9*  INR 1.80*  --  2.04* 2.15*  CREATININE 0.94 0.90  --  0.96     Assessment: 55 y/o [name] on chronic warfarin therapy for DVT/PE, INR 1.8 on admit. Outpatient warfarin dose Stacey mg daily (last dose 4/15) INR 2.15 is therapeutic today.  CBC WNL, no bleeding reported.  Goal of Therapy:  INR 2-3 Monitor platelets by anticoagulation protocol: Yes   Plan:  Continue home dose of Stacey mg daily Daily PT/INR -Monitor for bleeding  Eudelia Bunch, Pharm.D. [id number] 11/16/2015 12:16 PM

## 2015-11-16 NOTE — Progress Notes (Signed)
Pt reports vague sense of dizziness which is not positional.  Vitals taken and are stable; BP 142/58, HR 63, Temp 97.6, RR 16, CBG 126. Neuro remains intact; vision is clear; pt remains oriented x 4.  Pt scheduled for repeat CT scan this morning.  RN will continue to monitor.

## 2015-11-16 NOTE — Evaluation (Signed)
Occupational Therapy Evaluation Patient Details Name: Stacey [name] MRN: [id number] DOB: Jan 20, 1961 Today's Date: 11/16/2015    History of Present Illness Stacey [name] is a 55 y.o. woman with a history of HTN, DM, and morbid obesity who presented to the ED via EMS for evaluation of acute onset of left facial droop and left arm weakness, started around 8pm. Pt with c/o L facial droop and L UE weakness. Negative CT and MRI.   Clinical Impression   Patient evaluated by Occupational Therapy with no further acute OT needs identified. All education has been completed and the patient has no further questions. See below for any follow-up Occupational Therapy or equipment needs. OT to sign off. Thank you for referral.   Pt reports R hip is greatest barrier to transfers and quality of life. Pt could benefit from dietary consult  to help with meal planning and weight management to work toward R hip surgery. Pt asking for help and tearful at times during session.       Follow Up Recommendations  Home health OT    Equipment Recommendations  Hospital bed (bariatric , w/c cushion and bariatric recliner)    Recommendations for Other Services Other (comment) (dietary and case management)     Precautions / Restrictions Precautions Precautions: Fall Precaution Comments: morbidly obese, limited to stand pivots Restrictions Weight Bearing Restrictions: No      Mobility Bed Mobility Overal bed mobility: Needs Assistance Bed Mobility: Supine to Sit;Sit to Supine     Supine to sit: Supervision Sit to supine: Supervision   General bed mobility comments: HOB elevated and use of bed rail, increased time. pt reports she doesn't have bed rails at home  Transfers Overall transfer level: Needs assistance Equipment used: 1 person hand held assist Transfers: Sit to/from Stand Sit to Stand: Min guard Stand pivot transfers: Min guard       General transfer comment: pt with c/o R hip and  back pain. Pt guarded due to morbid obesity. Pt used OTs hand to assist herself back to bed due to bed rail not being up to allow for pt to side step up to [place] Overall balance assessment: Needs assistance Sitting-balance support: Feet supported Sitting balance-Leahy Scale: Good     Standing balance support: Bilateral upper extremity supported;During functional activity Standing balance-Leahy Scale: Poor                              ADL Overall ADL's : Needs assistance/impaired Eating/Feeding: Independent   Grooming: Wash/dry hands;Wash/dry face;Independent               Lower Body Dressing: Total assistance   Toilet Transfer: Supervision/safety;Stand-pivot   Toileting- Clothing Manipulation and Hygiene: Maximal assistance         General ADL Comments: Pt demonstrates bed mobility with heavy use of bed rail and incr time to complete transfer. pt holding onto environmental supports. pt reports having incr weakness in BIL LE and L LE buckling at times. Pt reports "i am starting to wear out my L knee and they think i might need a knee replacement"     Vision Additional Comments: Pt reports I am suppose to wear them but I dont have any right now. Pt has been prescribed corrective lens   Perception     Praxis      Pertinent Vitals/Pain       Hand Dominance Right  Extremity/Trunk Assessment Upper Extremity Assessment Upper Extremity Assessment: Generalized weakness   Lower Extremity Assessment Lower Extremity Assessment: Defer to PT evaluation RLE Deficits / Details: grossly 3/5, hip ROM limited by body habitat and pain LLE Deficits / Details: grossly 3/5, hip ROM limited by body habitus    Cervical / Trunk Assessment Cervical / Trunk Assessment: Normal   Communication Communication Communication: No difficulties   Cognition Arousal/Alertness: Awake/alert Behavior During Therapy: WFL for tasks assessed/performed Overall Cognitive  Status: Within Functional Limits for tasks assessed                     General Comments       Exercises Exercises: General Lower Extremity;Other exercises (provided HEP with therabands) Other Exercises Other Exercises: theraband band Green- shoulder flexion, shoulder adduction / abduction scapula retraction Other Exercises: handouts provided for UB and LB HEP and reviewed   Shoulder Instructions      Home Living Family/patient expects to be discharged to:: Private residence Living Arrangements: Parent;Children (x2) Available Help at Discharge: Family;Personal care attendant Type of Home: House Home Access: Ramped entrance     Home Layout: One level     Bathroom Shower/Tub:  (sponge bath)   Bathroom Toilet:  (bedside commode)     Home Equipment: Bedside commode;Wheelchair - manual;Hospital bed   Additional Comments: home health aide- Monday through saturday - four hours a day. Step dad is there all the time.       Prior Functioning/Environment Level of Independence: Needs assistance  Gait / Transfers Assistance Needed: has not walked in a year. stand pivot only. Pt reports needing a R hip surgery. Pt reports requires loosing 100 pounds prior to surgery ADL's / Homemaking Assistance Needed: CNA baths patient daily in the bed total (A)         OT Diagnosis: Generalized weakness   OT Problem List:     OT Treatment/Interventions:      OT Goals(Current goals can be found in the care plan section) Acute Rehab OT Goals Patient Stated Goal: loose weight to have hip surgery  OT Frequency:     Barriers to D/C:            Co-evaluation PT/OT/SLP Co-Evaluation/Treatment: Yes Reason for Co-Treatment: Complexity of the patient's impairments (multi-system involvement);For patient/therapist safety PT goals addressed during session: Mobility/safety with mobility OT goals addressed during session: ADL's and self-care;Strengthening/ROM      End of Session Nurse  Communication: Mobility status;Precautions  Activity Tolerance: Patient tolerated treatment well Patient left: in bed;with call bell/phone within reach   Time: [id number] OT Time Calculation (min): 52 min Charges:  OT General Charges $OT Visit: 1 Procedure OT Evaluation $OT Eval Moderate Complexity: 1 Procedure G-Codes: OT G-codes **NOT FOR INPATIENT CLASS** Functional Assessment Tool Used: clinical judgement Functional Limitation: Self care Self Care Current Status [id number]): At least 60 percent but less than 80 percent impaired, limited or restricted Self Care Goal Status [id number]): At least 60 percent but less than 80 percent impaired, limited or restricted Self Care Discharge Status [phone]): At least 60 percent but less than 80 percent impaired, limited or restricted  Parke Poisson B 11/16/2015, 11:14 AM   Jeri Modena   OTR/L Pager: [phone] Office: [phone] .

## 2015-11-16 NOTE — Evaluation (Addendum)
Physical Therapy Evaluation Patient Details Name: Stacey [name] MRN: [id number] DOB: [birth year] Today's Date: 11/16/2015   History of Present Illness  Stacey [name] is a 55 y.o. woman with a history of HTN, DM, and morbid obesity who presented to the ED via EMS for evaluation of acute onset of left facial droop and left arm weakness, started around 8pm. Negative CT and MRI.  Clinical Impression  Pt has been non-ambulatory for over a year due to R hip pain and morbid obesity. Discussed at length benefit of consulting a dietician and provided and HEP with therabands. Pt functioning at baseline. Acute PT to con't to follow to focus on HEP to aide in loss of body weight so pt can undergo her R THA>    Follow Up Recommendations Home health PT;Supervision/Assistance - 24 hour    Equipment Recommendations   (bariatric hospital bed and recliner)    Recommendations for Other Services       Precautions / Restrictions Precautions Precautions: Fall Precaution Comments: morbidly obese, limited to stand pvts Restrictions Weight Bearing Restrictions: No      Mobility  Bed Mobility Overal bed mobility: Needs Assistance Bed Mobility: Supine to Sit;Sit to Supine     Supine to sit: Supervision Sit to supine: Supervision   General bed mobility comments: HOB elevated and use of bed rail, increased time. pt reports she doesn't have bed rails at home  Transfers Overall transfer level: Needs assistance Equipment used: 1 person hand held assist Transfers: Sit to/from [facility] Sit to Stand: Min guard Stand pivot transfers: Min guard       General transfer comment: pt with c/o R hip and back pain. Pt guarded due to morbid obesity. Pt used OTs hand to assist herself back to bed due to bed rail not being up to allow for pt to side step up to [facility]  Ambulation/Gait             General Gait Details: pt non-ambulatory for >1 year due to "i need a new R hip but I have  to loose 100#s"  Stairs            Wheelchair Mobility    Modified Rankin (Stroke Patients Only)       Balance Overall balance assessment: Needs assistance Sitting-balance support: No upper extremity supported;Feet supported Sitting balance-Leahy Scale: Good     Standing balance support: During functional activity;Bilateral upper extremity supported Standing balance-Leahy Scale: Poor                               Pertinent Vitals/Pain      Home Living Family/patient expects to be discharged to:: Private residence Living Arrangements: Parent;Children (x2) Available Help at Discharge: Family;Personal care attendant Type of Home: House Home Access: Ramped entrance     Home Layout: One level Home Equipment: Bedside commode;Wheelchair - manual;Hospital bed Additional Comments: home health aide- Monday through saturday - four hours a day. Step dad is there all the time.     Prior Function Level of Independence: Needs assistance   Gait / Transfers Assistance Needed: has not walked in a year. stand pivot only. Pt reports needing a R hip surgery. Pt reports requires loosing 100 pounds prior to surgery  ADL's / Homemaking Assistance Needed: CNA pays patient daily in the bed total (A)         Hand Dominance   Dominant Hand: Right    Extremity/Trunk  Assessment   Upper Extremity Assessment: Defer to OT evaluation           Lower Extremity Assessment: RLE deficits/detail;LLE deficits/detail RLE Deficits / Details: grossly 3/5, hip ROM limited by body habitat and pain LLE Deficits / Details: grossly 3/5, hip ROM limited by body habitus   Cervical / Trunk Assessment: Normal  Communication   Communication: No difficulties  Cognition Arousal/Alertness: Awake/alert Behavior During Therapy: WFL for tasks assessed/performed Overall Cognitive Status: Within Functional Limits for tasks assessed                      General Comments General  comments (skin integrity, edema, etc.): discussed at length working with dietary or a nutrionist to assist with weight loss and provided extensive HEP    Exercises General Exercises - Lower Extremity Long Arc Quad: Both;5 reps;Seated (with 5 sec hold and theraband) Heel Slides: AROM;Both;5 reps;Seated (with theraband) Hip ABduction/ADduction: AROM;Both;Seated (with theraband)      Assessment/Plan    PT Assessment Patient needs continued PT services  PT Diagnosis Difficulty walking;Generalized weakness   PT Problem List Decreased strength;Decreased range of motion;Decreased activity tolerance;Decreased balance;Decreased mobility;Obesity  PT Treatment Interventions DME instruction;Gait training;Functional mobility training;Therapeutic activities;Therapeutic exercise;Balance training   PT Goals (Current goals can be found in the Care Plan section) Acute Rehab PT Goals Patient Stated Goal: loose weight and move PT Goal Formulation: With patient    Frequency Min 2X/week   Barriers to discharge        Co-evaluation PT/OT/SLP Co-Evaluation/Treatment: Yes Reason for Co-Treatment: Complexity of the patient's impairments (multi-system involvement) PT goals addressed during session: Mobility/safety with mobility OT goals addressed during session: ADL's and self-care       End of Session   Activity Tolerance: Patient tolerated treatment well Patient left: in bed;with call bell/phone within reach;with bed alarm set Nurse Communication: Mobility status    Functional Assessment Tool Used: clinical judgement Functional Limitation: Mobility: Walking and moving around Mobility: Walking and Moving Around Current Status [id number]): At least 60 percent but less than 80 percent impaired, limited or restricted Mobility: Walking and Moving Around Goal Status [phone]): At least 20 percent but less than 40 percent impaired, limited or restricted    Time: [id number] PT Time Calculation (min) (ACUTE  ONLY): 56 min   Charges:   PT Evaluation $PT Eval Low Complexity: 1 Procedure PT Treatments $Therapeutic Exercise: 8-22 mins   PT G Codes:   PT G-Codes **NOT FOR INPATIENT CLASS** Functional Assessment Tool Used: clinical judgement Functional Limitation: Mobility: Walking and moving around Mobility: Walking and Moving Around Current Status [id number]): At least 60 percent but less than 80 percent impaired, limited or restricted Mobility: Walking and Moving Around Goal Status [phone]): At least 20 percent but less than 40 percent impaired, limited or restricted    Kingsley Callander 11/16/2015, 10:29 AM   Kittie Plater, PT, DPT Pager #: [phone] Office #: [phone]

## 2015-11-16 NOTE — Evaluation (Signed)
SLP Cancellation Note  Patient Details Name: Stacey [name] MRN: [id number] DOB: 09/30/1960   Cancelled treatment:       Reason Eval/Treat Not Completed:  (pt being cleaned up per RN, will continue efforts for SLE)   Luanna Salk, [facility] SLP [phone]

## 2015-11-16 NOTE — Progress Notes (Signed)
Patient ID: Stacey [name], female   DOB: 16-Oct-1960, 55 y.o.   MRN: [id number]    PROGRESS NOTE    Stacey [name]  [id number] DOB: November 26, 1960 DOA: 11/14/2015  PCP: Alvester Chou, NP   Outpatient Specialists: None   Brief Narrative:  55 y.o. female with hypertension, depression, diabetes mellitus, previous pancreatitis, neuropathy, history of DVT, history of pulmonary embolus, chronic Coumadin therapy presented with left facial weakness and numbness. 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  On chronic AC with Coumadin  - for DVT and PE   DVT prophylaxis: on Coumadin  Code Status: Full Family Communication: Patient at bedside  Disposition Plan: Home once ECHO and carotid dopplers done and results back   Consultants:   Neurology   Procedures:   None   Antimicrobials:   None  Subjective: Reports feeling better.   Objective: Filed Vitals:   11/15/15 2225 11/16/15 0100 11/16/15 0451 11/16/15 1020  BP:  142/58 141/60 137/44  Pulse: 71 63 70 73  Temp:  97.5 F (36.4 C) 97.6 F (36.4 C) 98.4 F (36.9 C)  TempSrc:  Oral Oral Oral  Resp: 18 16 18 16   Height:      Weight:      SpO2: 98% 99% 100% 98%    Intake/Output Summary (Last 24 hours) at 11/16/15 1343 Last data filed at 11/15/15 1738  Gross per 24 hour  Intake    240 ml  Output       0 ml  Net    240 ml   Filed Weights   11/14/15 2212  Weight: 196.09 kg (432 lb 4.8 oz)    Examination:  General exam: Appears calm and comfortable  Respiratory system: Clear to auscultation. Respiratory effort normal. Cardiovascular system: S1 & S2 heard, RRR. No JVD, murmurs, rubs, gallops or clicks. No pedal edema. Gastrointestinal system: Abdomen is nondistended, soft and nontender. No organomegaly or masses felt.  Central nervous system: Alert and oriented. No focal neurological deficits. Extremities: Symmetric 5 x 5 power.   Data Reviewed: I have personally reviewed following labs and imaging studies  CBC:  Recent Labs Lab 11/14/15 2146 11/14/15 2153 11/16/15 0442  WBC 8.7  --  8.0  NEUTROABS 5.5  --   --   HGB 11.8* 12.6 12.0  HCT 37.2 37.0 38.6  MCV 87.5  --  88.7  PLT 256  --  [id number]   Basic Metabolic Panel:  Recent Labs Lab 11/14/15 2146 11/14/15 2153 11/16/15 0442  NA 137 141 139  K 4.0 4.2 4.4  CL 104 106 103  CO2 23  --  26  GLUCOSE 127* 124* 128*  BUN 19 21* 16  CREATININE 0.94 0.90 0.96  CALCIUM 9.4  --  9.8   Liver Function Tests:  Recent Labs Lab 11/14/15 2146  AST 20  ALT 22  ALKPHOS 54  BILITOT 0.5  PROT 6.9  ALBUMIN 3.6   Coagulation Profile:  Recent Labs Lab 11/14/15 2146 11/15/15 1111 11/16/15 0442  INR 1.80* 2.04* 2.15*   HbA1C:  Recent Labs  11/15/15 0500  HGBA1C 6.0*   CBG:  Recent Labs Lab 11/15/15 1641 11/15/15 2115 11/16/15 0206 11/16/15 0652 11/16/15 1146  GLUCAP 130* 135* 126* 132* 86   Lipid Profile:  Recent Labs  11/15/15 0500  CHOL 260*  HDL 44  LDLCALC 163*  TRIG 263*  CHOLHDL 5.9   Urine analysis:    Component Value Date/Time   COLORURINE YELLOW* 08/20/2015 0903   COLORURINE Yellow 10/24/2014 0818   APPEARANCEUR CLEAR* 08/20/2015 0903   APPEARANCEUR Clear 10/24/2014 0818   LABSPEC 1.018 08/20/2015 0903   LABSPEC 1.023 10/24/2014 0818   PHURINE 6.0 08/20/2015 0903   PHURINE  5.0 10/24/2014 0818   GLUCOSEU NEGATIVE 08/20/2015 0903   GLUCOSEU Negative 10/24/2014 0818   HGBUR NEGATIVE 08/20/2015 0903   HGBUR Negative 10/24/2014 0818   BILIRUBINUR NEGATIVE 08/20/2015 0903   BILIRUBINUR Negative 10/24/2014 0818   KETONESUR NEGATIVE 08/20/2015 0903   KETONESUR Negative 10/24/2014 0818   PROTEINUR NEGATIVE 08/20/2015 0903   PROTEINUR Negative 10/24/2014 0818   UROBILINOGEN 0.2 03/25/2015 1203   NITRITE NEGATIVE 08/20/2015 0903   NITRITE Negative 10/24/2014 0818   LEUKOCYTESUR NEGATIVE 08/20/2015 0903   LEUKOCYTESUR Negative 10/24/2014 0818   Sepsis Labs: @LABRCNTIP (procalcitonin:4,lacticidven:4)  )No results found for this or any previous visit (from the past 240 hour(s)).    Radiology Studies: Ct Head Wo Contrast  11/16/2015  CLINICAL DATA:  Initial evaluation for acute left-sided facial numbness and weakness. EXAM: CT HEAD WITHOUT CONTRAST TECHNIQUE: Contiguous axial images were obtained from the base of the skull through the vertex without intravenous contrast. COMPARISON:  Prior CT from 11/14/2015. FINDINGS: There is no acute intracranial hemorrhage or infarct. No mass lesion or midline shift. Gray-white matter differentiation is well maintained. Ventricles are normal in size without evidence of hydrocephalus. CSF containing spaces are within normal limits. No extra-axial fluid collection. The calvarium is intact. Orbital soft tissues are within normal limits. The paranasal sinuses and mastoid air cells are well pneumatized and free of fluid. Scalp soft tissues are unremarkable. IMPRESSION: Stable exam. Normal head CT. No new acute intracranial abnormality identified. Electronically Signed   By: Jeannine Boga M.D.   On: 11/16/2015 05:24   Ct Head Wo Contrast  11/14/2015  CLINICAL DATA:  Left-sided weakness and slurred speech. EXAM: CT HEAD WITHOUT CONTRAST TECHNIQUE: Contiguous axial images were obtained from the base of the skull through the vertex  without intravenous contrast. COMPARISON:  08/08/2014 FINDINGS: There is no intracranial hemorrhage, mass or evidence of acute infarction. There is no extra-axial fluid collection. Gray matter and white matter appear normal. Cerebral volume is normal for age. Brainstem and posterior fossa are unremarkable. The CSF spaces appear normal. No interval change from 08/08/2014 The bony structures are intact. The visible portions of the paranasal sinuses are clear. Visible orbits are unremarkable. IMPRESSION: Normal brain. Critical Value/emergent results were called by telephone at the time of interpretation on 11/14/2015 at 10:08 pm to Dr. Wallie Char, who verbally acknowledged these results. Electronically Signed   By: Andreas Newport M.D.   On: 11/14/2015 22:09      Scheduled Meds: . atorvastatin  80 mg Oral Daily  . buPROPion  300 mg Oral Daily  . busPIRone  30 mg Oral BID  . escitalopram  10 mg Oral BID  . hydrochlorothiazide  25 mg Oral Daily  . insulin aspart  0-20 Units Subcutaneous TID WC  . lisinopril  10 mg Oral Daily  . polyethylene glycol  17 g Oral Daily  . senna-docusate  1 tablet Oral BID  . warfarin  10 mg Oral q1800  . Warfarin - Pharmacist Dosing Inpatient   Does not apply q1800   Continuous Infusions:    LOS: 2 days    Time spent: 20 minutes   Faye Ramsay, MD [facility] Pager [phone]  If 7PM-7AM, please contact night-coverage [email or web address] Password [email or web address] 11/16/2015, 1:43 PM

## 2015-11-16 NOTE — Progress Notes (Signed)
STROKE TEAM PROGRESS NOTE   SUBJECTIVE (INTERVAL HISTORY) No family at the bedside. Stable over night. Patient is questioning physician if he is sure she has had a stroke. Dr. Leonie Man spoke at length with her about relationship between stroke and heart.   OBJECTIVE Temp:  [97.5 F (36.4 C)-98.4 F (36.9 C)] 98.4 F (36.9 C) (04/17 1020) Pulse Rate:  [63-80] 73 (04/17 1020) Cardiac Rhythm:  [-] Normal sinus rhythm (04/17 0705) Resp:  [16-18] 16 (04/17 1020) BP: (124-156)/(44-73) 137/44 mmHg (04/17 1020) SpO2:  [97 %-100 %] 98 % (04/17 1020)  CBC:   Recent Labs Lab 11/14/15 2146 11/14/15 2153 11/16/15 0442  WBC 8.7  --  8.0  NEUTROABS 5.5  --   --   HGB 11.8* 12.6 12.0  HCT 37.2 37.0 38.6  MCV 87.5  --  88.7  PLT 256  --  [id number]   Basic Metabolic Panel:   Recent Labs Lab 11/14/15 2146 11/14/15 2153 11/16/15 0442  NA 137 141 139  K 4.0 4.2 4.4  CL 104 106 103  CO2 23  --  26  GLUCOSE 127* 124* 128*  BUN 19 21* 16  CREATININE 0.94 0.90 0.96  CALCIUM 9.4  --  9.8   Lipid Panel:     Component Value Date/Time   CHOL 260* 11/15/2015 0500   TRIG 263* 11/15/2015 0500   HDL 44 11/15/2015 0500   CHOLHDL 5.9 11/15/2015 0500   VLDL 53* 11/15/2015 0500   LDLCALC 163* 11/15/2015 0500   HgbA1c:  Lab Results  Component Value Date   HGBA1C 6.0* 11/15/2015   Urine Drug Screen:     Component Value Date/Time   LABOPIA NONE DETECTED 08/20/2015 0903   COCAINSCRNUR NONE DETECTED 08/20/2015 0903   LABBENZ NONE DETECTED 08/20/2015 0903   AMPHETMU NONE DETECTED 08/20/2015 0903   THCU NONE DETECTED 08/20/2015 0903   LABBARB NONE DETECTED 08/20/2015 0903     IMAGING  Ct Head Wo Contrast 11/16/2015  Stable exam. Normal head CT. No new acute intracranial abnormality identified. 11/14/2015   Normal brain.    PHYSICAL EXAM Neurologic Examination:  Mental Status:  Alert, oriented, thought content appropriate. Speech without evidence of dysarthria or aphasia. Able to follow 3  step commands without difficulty.  Cranial Nerves:  II-bilateral visual fields intact III/IV/VI-Pupils were equal and reacted. Extraocular movements were full.  V/VII-no facial numbness and no facial weakness.  VIII-hearing normal.  X-normal speech and symmetrical palatal movement.  XII-midline tongue extension  Motor: 5/5 strength symmetrical throughout.  Muscle tone normal throughout. Sensory: Intact to light touch in all extremities. Deep Tendon Reflexes: 2/4 throughout Plantars: Downgoing bilaterally  Cerebellar: Normal finger to nose and heel to shin bilaterally. Gait: not tested    ASSESSMENT/PLAN Stacey [name] is a 55 y.o. female with history of hypertension, depression, diabetes mellitus, previous pancreatitis, previous C. difficile colitis, neuropathy, history of DVT, history of pulmonary embolus, chronic Coumadin therapy presenting with left facial weakness and numbness. 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 Today's INR 2.15  Patient counseled to be compliant with her antithrombotic medications  Ongoing aggressive stroke risk factor management  Therapy recommendations:  HH PT and HH OT with bariatric bed and recliner  Disposition:  Return home  Hypertension  Stable  Permissive hypertension (OK if < 220/120) but gradually normalize in 5-7 days  Hyperlipidemia  Home meds: Lipitor 40 mg daily prior to admission  LDL 163, goal < 70  Lipitor increased to 80 mg daily  Continue  statin at discharge  Diabetes  HgbA1c 6.0, at goal < 7.0  Other Stroke Risk Factors  Morbid Obesity, Body mass index is 69.81 kg/(m^2).   Family hx stroke   Hx DVT with resultant PE, on coumadin PTA  obstructive sleep apnea,  CPAP  Other Active Problems  Contrast allergy -> nausea and vomiting  Pharmacy dosing Coumadin  Hospital day # 2  [place] for Pager information 11/16/2015 11:23 AM  I have personally examined this patient, reviewed notes, independently viewed imaging studies, participated in medical decision making and plan of care. I have made any additions or clarifications directly to the above note. Agree with note above. She likely had a right brain TIA given 2 separate episodes of left face and arm weakness and numbness despite being on anticoagulation. Plan to check transcranial Doppler bubble study to look for a right to left cardiac shunt as if we find this she may need consideration for endovascular closure. She remains at risk for neurological worsening, recurrent stroke, TIA  Antony Contras, MD Medical Director [facility] Pager: [phone] 11/16/2015 2:43 PM  To contact Stroke Continuity provider, please refer to [email or web address] After hours, contact General Neurology

## 2015-11-16 NOTE — Care Management Note (Signed)
Case Management Note  Patient Details  Name: GIRDIE FISCH MRN: [id number] Date of Birth: [birth year]  Subjective/Objective:    Patient admitted with TIA. She is from home with family.                 Action/Plan: PT rec is for HHPT and DME. CM following for discharge needs.  Expected Discharge Date:                  Expected Discharge Plan:  [place]  In-House Referral:     Discharge planning Services     Post Acute Care Choice:    Choice offered to:     DME Arranged:    DME Agency:     HH Arranged:    [place] Agency:     Status of Service:  In process, will continue to follow  Medicare Important Message Given:    Date Medicare IM Given:    Medicare IM give by:    Date Additional Medicare IM Given:    Additional Medicare Important Message give by:     If discussed at [place] of Stay Meetings, dates discussed:    Additional Comments:  Pollie Friar, RN 11/16/2015, 11:03 AM

## 2015-11-16 NOTE — Progress Notes (Signed)
Patient placed on CPAP HS. NO O2 bleed in. Patient tolerating well. RT will continue to monitor as needed.

## 2015-11-16 NOTE — Progress Notes (Signed)
Nutrition Education Note  RD consulted for nutrition education.   55 y.o. female with history of hypertension, depression, diabetes mellitus, previous pancreatitis, previous C. difficile colitis, neuropathy, history of DVT, history of pulmonary embolus, chronic Coumadin therapy presenting with left facial weakness and numbness. R brain TIA.   Lipid Panel     Component Value Date/Time   CHOL 260* 11/15/2015 0500   TRIG 263* 11/15/2015 0500   HDL 44 11/15/2015 0500   CHOLHDL 5.9 11/15/2015 0500   VLDL 53* 11/15/2015 0500   LDLCALC 163* 11/15/2015 0500    Per chart review, pt has received heart healthy diet education in the past. Pt states that based on what she has learned in the past, she feels that her major problem is that she binges on sweets such as cookies, cakes, ice cream etc. She then feels guilty afterward. RD reviewed patient's dietary recall. Breakfast is usually a fruit smoothie, lunch is usually a sandwich, leftovers, or chinese food, and dinner is usually mashed potatoes, fried chicken, and corn. RD discussed that all foods offer energy and nutrition; discussed how to fit sweets into diet/nutrition plan in a healthful way. Discussed the negative effects of guilt being a part of her normal eating pattern and encouraged pt to see outpatient dietitian for nutritional counseling; recommended [place].   RD provided "Mini Stroke Nutrition Therapy", "20 Ways to Eat More Fruits and Vegetables", and "Healthy Snacks for Adults and Teens" handouts from the Academy of [employer] and Dietetics. Provided examples on ways to decrease sodium and fat intake in diet. Discussed tips for eating out. Discouraged use of salt shaker and discussed flavoring tips with seasonings/herbs. Encouraged fresh fruits and vegetables as well as whole grain sources of carbohydrates to maximize fiber intake. Teach back method used.  Expect fair compliance. Pt states that she plans to go  home and change everything; she was not able to give an exact example.   Body mass index is 69.81 kg/(m^2). Pt meets criteria for Morbid Obesity based on current BMI.  Current diet order is Carb Modified, patient is consuming approximately 50-100% of meals at this time. Labs and medications reviewed. No further nutrition interventions warranted at this time. RD contact information provided. If additional nutrition issues arise, please re-consult RD.  Scarlette Ar RD, LDN Inpatient Clinical Dietitian Pager: [phone] After Hours Pager: [phone]

## 2015-11-16 NOTE — Care Management Note (Signed)
Case Management Note  Patient Details  Name: Stacey [name] MRN: [id number] Date of Birth: 16-Jan-1961  Subjective/Objective:                    Action/Plan: Pt with orders for [facility] services. CM met with the patient and provided her a list of [facility] agencies in the [place] area. She selected [facility]. CM left a message with Santiago Glad from [facility] and will follow up on the referral. Pts Medicaid will not cover the PT/OT services but will cover the RN. Patient already is receiving Aide services through [facility]. Pt also ordered a bariatric hospital bed and bariatric recliner. CM spoke with Jermaine with [facility] and he states she will be able to get the bed and possibly the recliner. CM will continue to follow for d/c needs.   Expected Discharge Date:                  Expected Discharge Plan:  [place]  In-House Referral:     Discharge planning Services     Post Acute Care Choice:    Choice offered to:     DME Arranged:    DME Agency:     HH Arranged:  RN, PT, OT, Nurse's Aide (Pt does not have diagnosis to qualify for PT/OT with [facility]idca[facility]--already has an aide.) [place] Agency:  [place]  Status of Service:  In process, will continue to follow  Medicare Important Message Given:    Date Medicare IM Given:    Medicare IM give by:    Date Additional Medicare IM Given:    Additional Medicare Important Message give by:     If discussed at [place] of Stay Meetings, dates discussed:    Additional Comments:  Pollie Friar, RN 11/16/2015, 2:04 PM

## 2015-11-16 NOTE — Progress Notes (Signed)
OT NOTE  MD: Please order dietary consult and case management to address DME needs. Pt reports that she must lose 100 pounds to qualify for help replacement. Pt tearful to therapist expressing decr quality of life and wanting "a normal life again".   Formal evaluation to follow  Jeri Modena   OTR/L Pager: [phone] Office: [phone] .

## 2015-11-17 ENCOUNTER — Observation Stay (HOSPITAL_BASED_OUTPATIENT_CLINIC_OR_DEPARTMENT_OTHER): Payer: Medicaid Other

## 2015-11-17 DIAGNOSIS — R29810 Facial weakness: Secondary | ICD-10-CM

## 2015-11-17 DIAGNOSIS — G459 Transient cerebral ischemic attack, unspecified: Secondary | ICD-10-CM

## 2015-11-17 DIAGNOSIS — I10 Essential (primary) hypertension: Secondary | ICD-10-CM | POA: Diagnosis not present

## 2015-11-17 DIAGNOSIS — G458 Other transient cerebral ischemic attacks and related syndromes: Secondary | ICD-10-CM | POA: Diagnosis not present

## 2015-11-17 LAB — BASIC METABOLIC PANEL
Anion gap: 11 (ref 5–15)
BUN: 14 mg/dL (ref 6–20)
CHLORIDE: 102 mmol/L (ref 101–111)
CO2: 26 mmol/L (ref 22–32)
Calcium: 9.9 mg/dL (ref 8.9–10.3)
Creatinine, Ser: 0.93 mg/dL (ref 0.44–1.00)
GFR calc Af Amer: >60 mL/min (ref >60)
GFR calc non Af Amer: >60 mL/min (ref >60)
GLUCOSE: 110 mg/dL — AB (ref 65–99)
Potassium: 4.1 mmol/L (ref 3.5–5.1)
Sodium: 139 mmol/L (ref 135–145)

## 2015-11-17 LAB — ECHOCARDIOGRAM COMPLETE
Height: 66 in
Weight: 6916.8 oz

## 2015-11-17 LAB — CBC
HCT: 39.4 % (ref 36.0–46.0)
HEMOGLOBIN: 12.4 g/dL (ref 12.0–15.0)
MCH: 27.5 pg (ref 26.0–34.0)
MCHC: 31.5 g/dL (ref 30.0–36.0)
MCV: 87.4 fL (ref 78.0–100.0)
PLATELETS: 242 10*3/uL (ref 150–400)
RBC: 4.51 MIL/uL (ref 3.87–5.11)
RDW: 13.7 % (ref 11.5–15.5)
WBC: 6.1 10*3/uL (ref 4.0–10.5)

## 2015-11-17 LAB — PROTIME-INR
INR: 2.36 — ABNORMAL HIGH (ref 0.00–1.49)
Prothrombin Time: 25.6 seconds — ABNORMAL HIGH (ref 11.6–15.2)

## 2015-11-17 LAB — GLUCOSE, CAPILLARY
GLUCOSE-CAPILLARY: 108 mg/dL — AB (ref 65–99)
GLUCOSE-CAPILLARY: 118 mg/dL — AB (ref 65–99)
Glucose-Capillary: 103 mg/dL — ABNORMAL HIGH (ref 65–99)

## 2015-11-17 MED ORDER — WARFARIN SODIUM 10 MG PO TABS: 10.0000 mg | ORAL_TABLET | Freq: Every morning | ORAL | Status: DC

## 2015-11-17 MED ORDER — WARFARIN SODIUM 7.5 MG PO TABS
7.5000 mg | ORAL_TABLET | Freq: Once | ORAL | Status: AC
  Administered 2015-11-17: 7.5 mg via ORAL
  Filled 2015-11-17: qty 1

## 2015-11-17 MED ORDER — ATORVASTATIN CALCIUM 80 MG PO TABS: 80.0000 mg | ORAL_TABLET | Freq: Every day | ORAL | Status: DC

## 2015-11-17 MED ORDER — WARFARIN SODIUM 7.5 MG PO TABS: 7.5000 mg | ORAL_TABLET | Freq: Once | ORAL | Status: DC

## 2015-11-17 MED ORDER — PERFLUTREN LIPID MICROSPHERE
INTRAVENOUS | Status: AC
  Filled 2015-11-17: qty 10

## 2015-11-17 NOTE — Progress Notes (Signed)
Pt d/c to home by car with family. Assessment stable. Prescriptions given. All questions answered. 

## 2015-11-17 NOTE — Progress Notes (Signed)
[facility] for Warfarin  Indication: VTE  Allergies  Allergen Reactions  . Hydrocodone Anaphylaxis  . Nitroglycerin Anxiety  . Tramadol Anaphylaxis and Swelling  . Hydromorphone Other (See Comments)    "hallucinations" has tolerated morphine, but "it doesn't work very well"  . Levaquin [Levofloxacin In D5w] Nausea And Vomiting  . Toradol [Ketorolac Tromethamine] Swelling  . Levofloxacin Other (See Comments)  . Sulfa Antibiotics Rash  . Tape Rash  . Trazodone And Nefazodone Other (See Comments)   Patient Measurements: Height: 5\' 6"  (167.6 cm) Weight: (!) 432 lb 4.8 oz (196.09 kg) IBW/kg (Calculated) : 59.3  Vital Signs: Temp: 98.1 F (36.7 C) (04/18 0942) Temp Source: Oral (04/18 0942) BP: 135/51 mmHg (04/18 0942) Pulse Rate: 72 (04/18 0942)  Labs:  Recent Labs  11/14/15 2146 11/14/15 2153 11/15/15 1111 11/16/15 0442 11/17/15 0801  HGB 11.8* 12.6  --  12.0 12.4  HCT 37.2 37.0  --  38.6 39.4  PLT 256  --   --  259 242  APTT 38*  --   --   --   --   LABPROT 20.9*  --  22.9* 23.9* 25.6*  INR 1.80*  --  2.04* 2.15* 2.36*  CREATININE 0.94 0.90  --  0.96 0.93     Assessment: 55 y/o F on chronic warfarin therapy for DVT/PE, INR 1.8 on admit. -INR 2.36 and at goal with trend up.  CBC WNL   Outpatient warfarin dose: 10 mg daily (last dose 4/15)  Goal of Therapy:  INR 2-3 Monitor platelets by anticoagulation protocol: Yes   Plan:  Coumadin 7.5mg  po today Daily PT/INR  Hildred Laser, Pharm D 11/17/2015 11:41 AM

## 2015-11-17 NOTE — Discharge Summary (Signed)
Physician Discharge Summary  Stacey [name] [id number] DOB: Apr 01, 1961 DOA: 11/14/2015  PCP: Alvester Chou, NP  Admit date: 11/14/2015 Discharge date: 11/17/2015  Recommendations for Outpatient Follow-up:  1. Pt will need to follow up with PCP in 1-2 weeks post discharge 2. Please obtain BMP to evaluate electrolytes and kidney function 3. Please also check CBC to evaluate Hg and Hct levels  Discharge Diagnoses:  Principal Problem:   TIA (transient ischemic attack) Active Problems:   Morbid obesity ([place])   HTN (hypertension)   Diabetes mellitus type II, controlled ([place])   Facial droop   Discharge Condition: Stable  Diet recommendation: Heart healthy diet discussed in details   Brief Narrative:  55 y.o. female with hypertension, depression, diabetes mellitus, previous pancreatitis, neuropathy, history of DVT, history of pulmonary embolus, chronic Coumadin therapy presented with left facial weakness and numbness. 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  On chronic AC with Coumadin  - for DVT and PE  DVT prophylaxis: on Coumadin  Code Status: Full Family Communication: Patient at bedside  Disposition Plan: Home   Consultants:   Neurology  Procedures:    None  Antimicrobials:   None  Discharge Exam: Filed Vitals:   11/17/15 0942 11/17/15 1426  BP: 135/51 151/54  Pulse: 72 78  Temp: 98.1 F (36.7 C) 98.1 F (36.7 C)  Resp: 18 18   Filed Vitals:   11/17/15 0129 11/17/15 0507 11/17/15 0942 11/17/15 1426  BP: 145/71 174/63 135/51 151/54  Pulse: 71 72 72 78  Temp: 97.4 F (36.3 C) 97.4 F (36.3 C) 98.1 F (36.7 C) 98.1 F (36.7 C)  TempSrc: Axillary Oral Oral Oral  Resp: 16 16 18 18   Height:      Weight:      SpO2: 99% 100% 99% 97%    General: Pt is alert, follows commands appropriately, not in acute distress, morbid obesity  Cardiovascular: Regular rate and rhythm, S1/S2 +, no murmurs, no rubs, no gallops Respiratory: Clear to auscultation bilaterally, no wheezing, no crackles, no rhonchi Neuro: Grossly nonfocal  Discharge Instructions  Discharge Instructions    Ambulatory referral to Neurology    Complete by:  As directed   Please schedule post stroke follow up in 2 months.     Diet - low sodium heart healthy    Complete by:  As directed      Increase activity slowly    Complete by:  As directed             Medication List    TAKE these medications        albuterol 108 (90 Base) MCG/ACT inhaler  Commonly known as:  PROVENTIL HFA;VENTOLIN HFA  Inhale 2 puffs into the lungs every  4 (four) hours as needed for wheezing or shortness of breath.     atorvastatin 80 MG tablet  Commonly known as:  LIPITOR  Take 1 tablet (80 mg total) by mouth daily. Reported on 11/14/2015     buPROPion 300 MG 24 hr tablet  Commonly known as:  WELLBUTRIN XL  Take 300 mg by mouth daily.     busPIRone 15 MG tablet  Commonly known as:  BUSPAR  Take 2 tablets (30 mg total) by mouth 2 (two) times daily.     escitalopram 20 MG tablet  Commonly known as:  LEXAPRO  Take 10 mg by mouth 2 (two) times daily.     hydrochlorothiazide 25 MG tablet  Commonly known as:  HYDRODIURIL  Take 25 mg by mouth daily. Reported on 11/14/2015      lisinopril 10 MG tablet  Commonly known as:  PRINIVIL,ZESTRIL  Take 10 mg by mouth daily.     LORazepam 2 MG tablet  Commonly known as:  ATIVAN  Take 2 mg by mouth 3 (three) times daily as needed.     nystatin cream  Commonly known as:  MYCOSTATIN  Apply 1 application topically 2 (two) times daily as needed for dry skin.     oxyCODONE-acetaminophen 5-325 MG tablet  Commonly known as:  PERCOCET  Take 2 tablets by mouth every 6 (six) hours as needed for moderate pain or severe pain.     warfarin 10 MG tablet  Commonly known as:  COUMADIN  Take 1 tablet (10 mg total) by mouth every morning. Resume your coumadin 10 mg tablet starting 11/17/2017. You will get one dose today 11/17/2015 before discharge  Start taking on:  11/18/2015            Follow-up Information    Follow up with SETHI,PRAMOD, MD In 2 months.   Specialties:  Neurology, Radiology   Why:  Stroke Clinic, Office will call you with appointment date & time   Contact information:   [address] [place] [address] [phone]       Follow up with Alvester Chou, NP.   Specialty:  Nurse Practitioner   Contact information:   [facility] [place] [address] [phone]       Call Faye Ramsay, MD.   Specialty:  Internal Medicine   Why:  As needed call my cell phone [phone]   Contact information:   [address] [place] [address] [phone]        The results of significant diagnostics from this hospitalization (including imaging, microbiology, ancillary and laboratory) are listed below for reference.     Microbiology: No results found for this or any previous visit (from the past 240 hour(s)).   Labs: Basic Metabolic Panel:  Recent Labs Lab 11/14/15 2146 11/14/15 2153 11/16/15 0442 11/17/15 0801  NA 137 141 139 139  K 4.0 4.2 4.4 4.1  CL 104 106 103 102  CO2 23  --  26 26  GLUCOSE 127* 124*  128* 110*  BUN 19 21* 16 14  CREATININE 0.94 0.90 0.96 0.93  CALCIUM 9.4  --  9.8 9.9   Liver Function Tests:  Recent Labs Lab 11/14/15 2146  AST 20  ALT 22  ALKPHOS 54  BILITOT 0.5  PROT 6.9  ALBUMIN 3.6   CBC:  Recent Labs Lab 11/14/15 2146 11/14/15 2153 11/16/15 0442 11/17/15 0801  WBC 8.7  --  8.0 6.1  NEUTROABS 5.5  --   --   --   HGB 11.8* 12.6 12.0 12.4  HCT 37.2 37.0 38.6 39.4  MCV 87.5  --  88.7 87.4  PLT 256  --  259 242    BNP (last 3 results)  Recent Labs  10/09/15 0911  BNP 10.0   CBG:  Recent Labs Lab 11/16/15 1146 11/16/15 1639 11/16/15 2126 11/17/15 0606 11/17/15 1113  GLUCAP 86 130* 109* 103* 108*   SIGNED: Time coordinating discharge: 30 minutes  MAGICK-MYERS, ISKRA, MD  [facility] 11/17/2015, 3:43 PM Pager [phone]  If 7PM-7AM, please contact night-coverage [email or web address] Password [email or web address]

## 2015-11-17 NOTE — Consult Note (Signed)
Ch attempted to visit with pt; pt was unavailable receiving treatment; if pt still requests visit, please resubmit spiritual care consult. 10:35 AM

## 2015-11-17 NOTE — Progress Notes (Signed)
Transcranial Doppler Bubble Study has been completed.  Dr. Leonie Man performed procedure.  Verbal consent was taken and risks/ benefits explained.  The Right middle cerebral artery was insonated. IV was inserted into left forearm.  No HITS heard at rest or during Valsalva.  No Apparent PFO.    Landry Mellow, RDMS, RVT 11/17/2015

## 2015-11-17 NOTE — Discharge Instructions (Signed)
Transient Ischemic Attack °A transient ischemic attack (TIA) is a "warning stroke" that causes stroke-like symptoms. Unlike a stroke, a TIA does not cause permanent damage to the brain. The symptoms of a TIA can happen very fast and do not last long. It is important to know the symptoms of a TIA and what to do. This can help prevent a major stroke or death. °CAUSES  °A TIA is caused by a temporary blockage in an artery in the brain or neck (carotid artery). The blockage does not allow the brain to get the blood supply it needs and can cause different symptoms. The blockage can be caused by either: °· A blood clot. °· Fatty buildup (plaque) in a neck or brain artery. °RISK FACTORS °· High blood pressure (hypertension). °· High cholesterol. °· Diabetes mellitus. °· Heart disease. °· The buildup of plaque in the blood vessels (peripheral artery disease or atherosclerosis). °· The buildup of plaque in the blood vessels that provide blood and oxygen to the brain (carotid artery stenosis). °· An abnormal heart rhythm (atrial fibrillation). °· Obesity. °· Using any tobacco products, including cigarettes, chewing tobacco, or electronic cigarettes. °· Taking oral contraceptives, especially in combination with using tobacco. °· Physical inactivity. °· A diet high in fats, salt (sodium), and calories. °· Excessive alcohol use. °· Use of illegal drugs (especially cocaine and methamphetamine). °· Being female. °· Being African American. °· Being over the age of 55 years. °· Family history of stroke. °· Previous history of blood clots, stroke, TIA, or heart attack. °· Sickle cell disease. °SIGNS AND SYMPTOMS  °TIA symptoms are the same as a stroke but are temporary. These symptoms usually develop suddenly, or may be newly present upon waking from sleep: °· Sudden weakness or numbness of the face, arm, or leg, especially on one side of the body. °· Sudden trouble walking or difficulty moving arms or legs. °· Sudden  confusion. °· Sudden personality changes. °· Trouble speaking (aphasia) or understanding. °· Difficulty swallowing. °· Sudden trouble seeing in one or both eyes. °· Double vision. °· Dizziness. °· Loss of balance or coordination. °· Sudden severe headache with no known cause. °· Trouble reading or writing. °· Loss of bowel or bladder control. °· Loss of consciousness. °DIAGNOSIS  °Your health care provider may be able to determine the presence or absence of a TIA based on your symptoms, history, and physical exam. CT scan of the brain is usually performed to help identify a TIA. Other tests may include: °· Electrocardiography (ECG). °· Continuous heart monitoring. °· Echocardiography. °· Carotid ultrasonography. °· MRI. °· A scan of the brain circulation. °· Blood tests. °TREATMENT  °Since the symptoms of TIA are the same as a stroke, it is important to seek treatment as soon as possible. You may need a medicine to dissolve a blood clot (thrombolytic) if that is the cause of the TIA. This medicine cannot be given if too much time has passed. Treatment may also include:  °· Rest, oxygen, fluids through an IV tube, and medicines to thin the blood (anticoagulants). °· Measures will be taken to prevent short-term and long-term complications, including infection from breathing foreign material into the lungs (aspiration pneumonia), blood clots in the legs, and falls. °· Procedures to either remove plaque in the carotid arteries or dilate carotid arteries that have narrowed due to plaque. Those procedures are: °¨ Carotid endarterectomy. °¨ Carotid angioplasty and stenting. °· Medicines and diet may be used to address diabetes, high blood pressure, and   other underlying risk factors. °HOME CARE INSTRUCTIONS  °· Take medicines only as directed by your health care provider. Follow the directions carefully. Medicines may be used to control risk factors for a stroke. Be sure you understand all your medicine instructions. °· You  may be told to take aspirin or the anticoagulant warfarin. Warfarin needs to be taken exactly as instructed. °¨ Taking too much or too little warfarin is dangerous. Too much warfarin increases the risk of bleeding. Too little warfarin continues to allow the risk for blood clots. While taking warfarin, you will need to have regular blood tests to measure your blood clotting time. A PT blood test measures how long it takes for blood to clot. Your PT is used to calculate another value called an INR. Your PT and INR help your health care provider to adjust your dose of warfarin. The dose can change for many reasons. It is critically important that you take warfarin exactly as prescribed. °¨ Many foods, especially foods high in vitamin K can interfere with warfarin and affect the PT and INR. Foods high in vitamin K include spinach, kale, broccoli, cabbage, collard and turnip greens, Brussels sprouts, peas, cauliflower, seaweed, and parsley, as well as beef and pork liver, green tea, and soybean oil. You should eat a consistent amount of foods high in vitamin K. Avoid major changes in your diet, or notify your health care provider before changing your diet. Arrange a visit with a dietitian to answer your questions. °¨ Many medicines can interfere with warfarin and affect the PT and INR. You must tell your health care provider about any and all medicines you take; this includes all vitamins and supplements. Be especially cautious with aspirin and anti-inflammatory medicines. Do not take or discontinue any prescribed or over-the-counter medicine except on the advice of your health care provider or pharmacist. °¨ Warfarin can have side effects, such as excessive bruising or bleeding. You will need to hold pressure over cuts for longer than usual. Your health care provider or pharmacist will discuss other potential side effects. °¨ Avoid sports or activities that may cause injury or bleeding. °¨ Be careful when shaving,  flossing your teeth, or handling sharp objects. °¨ Alcohol can change the body's ability to handle warfarin. It is best to avoid alcoholic drinks or consume only very small amounts while taking warfarin. Notify your health care provider if you change your alcohol intake. °¨ Notify your dentist or other health care providers before procedures. °· Eat a diet that includes 5 or more servings of fruits and vegetables each day. This may reduce the risk of stroke. Certain diets may be prescribed to address high blood pressure, high cholesterol, diabetes, or obesity. °¨ A diet low in sodium, saturated fat, trans fat, and cholesterol is recommended to manage high blood pressure. °¨ A diet low in saturated fat, trans fat, and cholesterol, and high in fiber may control cholesterol levels. °¨ A controlled-carbohydrate, controlled-sugar diet is recommended to manage diabetes. °¨ A reduced-calorie diet that is low in sodium, saturated fat, trans fat, and cholesterol is recommended to manage obesity. °· Maintain a healthy weight. °· Stay physically active. It is recommended that you get at least 30 minutes of activity on most or all days. °· Do not use any tobacco products, including cigarettes, chewing tobacco, or electronic cigarettes. If you need help quitting, ask your health care provider. °· Limit alcohol intake to no more than 1 drink per day for nonpregnant women and 2 drinks   per day for men. One drink equals 12 ounces of beer, 5 ounces of wine, or 1½ ounces of hard liquor. °· Do not abuse drugs. °· A safe home environment is important to reduce the risk of falls. Your health care provider may arrange for specialists to evaluate your home. Having grab bars in the bedroom and bathroom is often important. Your health care provider may arrange for equipment to be used at home, such as raised toilets and a seat for the shower. °· Follow all instructions for follow-up with your health care provider. This is very important.  This includes any referrals and lab tests. Proper follow-up can prevent a stroke or another TIA from occurring. °PREVENTION  °The risk of a TIA can be decreased by appropriately treating high blood pressure, high cholesterol, diabetes, heart disease, and obesity, and by quitting smoking, limiting alcohol, and staying physically active. °SEEK MEDICAL CARE IF: °· You have personality changes. °· You have difficulty swallowing. °· You are seeing double. °· You have dizziness. °· You have a fever. °SEEK IMMEDIATE MEDICAL CARE IF:  °Any of the following symptoms may represent a serious problem that is an emergency. Do not wait to see if the symptoms will go away. Get medical help right away. Call your local emergency services (911 in [place] Do not drive yourself to the hospital. °· You have sudden weakness or numbness of the face, arm, or leg, especially on one side of the body. °· You have sudden trouble walking or difficulty moving arms or legs. °· You have sudden confusion. °· You have trouble speaking (aphasia) or understanding. °· You have sudden trouble seeing in one or both eyes. °· You have a loss of balance or coordination. °· You have a sudden, severe headache with no known cause. °· You have new chest pain or an irregular heartbeat. °· You have a partial or total loss of consciousness. °MAKE SURE YOU:  °· Understand these instructions. °· Will watch your condition. °· Will get help right away if you are not doing well or get worse. °  °This information is not intended to replace advice given to you by your health care provider. Make sure you discuss any questions you have with your health care provider. °  °Document Released: 04/27/2005 Document Revised: 08/08/2014 Document Reviewed: 10/23/2013 °Elsevier Interactive Patient Education ©2016 [employer] ° °

## 2015-11-17 NOTE — Evaluation (Signed)
Speech Language Pathology Evaluation Patient Details Name: Stacey [name] MRN: [id number] DOB: [birth year] Today's Date: 11/17/2015 Time: 1000-1018 SLP Time Calculation (min) (ACUTE ONLY): 18 min  Problem List:  Patient Active Problem List   Diagnosis Date Noted  . TIA (transient ischemic attack) 11/15/2015  . Facial droop 11/14/2015  . Chest pain 08/14/2013  . Atypical chest pain 08/14/2013  . Localized swelling, mass, or lump of right lower extremity 08/14/2013  . Pulmonary embolism 07/16/2013  . Short of breath on exertion 07/16/2013  . Cellulitis of leg, right 06/03/2013  . Anemia 02/18/2013  . Diabetes mellitus type II, controlled ([place]) 02/17/2013  . 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 Leukocytosis 02/07/2013  . LLQ abdominal pain 10/28/2012  . UTI (lower urinary tract infection) 10/28/2012  . Dyspnea 10/28/2012  . OSA (obstructive sleep apnea) 10/28/2012  . RLQ abdominal pain 06/20/2012  . Neuropathic pain 11/10/2011  . C. difficile colitis 11/08/2011  . Musculoskeletal pain 11/08/2011  . Bradycardia 11/08/2011  . PVC's (premature ventricular contractions) 11/07/2011  . Chest pain, atypical 11/07/2011  . Gastroenteritis 11/06/2011  . Heart palpitations 11/06/2011  . Morbid obesity ([place]) 11/06/2011  . Anxiety and depression 11/06/2011  . HTN (hypertension) 11/06/2011  . Depression 11/06/2011   Past Medical History:  Past Medical History  Diagnosis Date  . Hypertension   . Depression   . Type 2 diabetes mellitus ([place])   . Diverticulitis   . Asthma   . Cyst     pt has "cyst" to legs for years, areas drain at time  . Pancreatitis   . Arthritis   . C. difficile colitis 11/08/2011  . Musculoskeletal pain 11/08/2011  . Neuropathic pain 11/10/2011  . Sleep apnea with use of continuous positive airway pressure (CPAP)     uses CPAP at night  . 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 DVT (deep venous  thrombosis) ([place])   . Pulmonary embolism ([place])   . Hernia, inguinal    Past Surgical History:  Past Surgical History  Procedure Laterality Date  . Tonsillectomy    . Cholecystectomy    . Cesarean section    . Nose surgery    . Skin biopsy     HPI:  Stacey [name] is a 55 y.o. woman with a history of HTN, DM, and morbid obesity who presented to the ED via EMS for evaluation of acute onset of left facial droop and left arm weakness, started around 8pm. Negative CT.  Assessment / Plan / Recommendation Clinical Impression  Cognitive-linguistic evaluation complete.  Orders received due to left-sided facial weakness; however, ROM and strength intact.  Patient reports ingoing sensory changes on left, lower side of her face; however, this does nota appear to impact function given that speech is fully intelligible.  Receptive and expressive language intact with patient reporting that she experiences the need for more time and effort to express herself.  Patient also requires increased time to complete math calculations and deductive reasoning tasks.  Patient educated on the need to allow herself extra time and to not get frustrated with herself.  Given that patient is overall Mod I no further skilled SLP services are warranted at this time.  SLP signing off.     SLP Assessment  Patient does not need any further Speech Lanaguage Pathology Services    Follow Up Recommendations  None  SLP Evaluation Prior Functioning  Cognitive/Linguistic Baseline: Within functional limits Type of Home: House  Lives With: Family Available Help at Discharge: Family;Personal care attendant Vocation: On disability   Cognition  Overall Cognitive Status: Within Functional Limits for tasks assessed (Mod I ) Arousal/Alertness: Awake/alert Orientation Level: Oriented X4 Attention: Selective Selective Attention: Appears intact Memory: Appears intact Awareness: Appears intact Problem Solving:  Appears intact (Mod I ) Safety/Judgment: Appears intact Comments: pt reports that it takes her longer to think through things; however, overall Mod I     Comprehension  Auditory Comprehension Overall Auditory Comprehension: Appears within functional limits for tasks assessed    Expression Expression Primary Mode of Expression: Verbal Verbal Expression Overall Verbal Expression: Appears within functional limits for tasks assessed   Oral / Motor  Oral Motor/Sensory Function Overall Oral Motor/Sensory Function: Within functional limits Facial Sensation: Reduced left Motor Speech Overall Motor Speech: Appears within functional limits for tasks assessed   GO          Functional Assessment Tool Used: skilled clinical judgement Functional Limitations: Motor speech Motor Speech Current Status [id number]): 0 percent impaired, limited or restricted Motor Speech Goal Status [id number]): 0 percent impaired, limited or restricted Motor Speech Goal Status [id number]): 0 percent impaired, limited or restricted         Carmelia Roller., [place]  [place] 11/17/2015, 10:35 AM

## 2015-11-17 NOTE — Care Management Note (Signed)
Case Management Note  Patient Details  Name: TYSHA SIDHU MRN: [id number] Date of Birth: May 10, 1961  Subjective/Objective:                    Action/Plan: Patient discharging home today. [place] RN already set up yesterday. CM spoke with [facility] DME and the patients bed will be delivered to her home on Friday per Pt request. Bedside RN updated.   Expected Discharge Date:                  Expected Discharge Plan:  [place]  In-House Referral:     Discharge planning Services     Post Acute Care Choice:    Choice offered to:     DME Arranged:    DME Agency:     HH Arranged:  RN, PT, OT, Nurse's Aide (Pt does not have diagnosis to qualify for PT/OT with [facility]--already has an aide.) [place] Agency:  [facility]  Status of Service:  In process, will continue to follow  Medicare Important Message Given:    Date Medicare IM Given:    Medicare IM give by:    Date Additional Medicare IM Given:    Additional Medicare Important Message give by:     If discussed at [place] of Stay Meetings, dates discussed:    Additional Comments:  Pollie Friar, RN 11/17/2015, 4:07 PM

## 2015-11-17 NOTE — Progress Notes (Signed)
  Echocardiogram 2D Echocardiogram attempted definity but the IV was not patent has been performed.  Darlina Sicilian M 11/17/2015, 11:14 AM

## 2015-11-17 NOTE — Progress Notes (Signed)
STROKE TEAM PROGRESS NOTE   SUBJECTIVE (INTERVAL HISTORY) Her female friend is  at the bedside. Stable over night. I  spoke at length with her about relationship between stroke and heart. INR 2.36 today   OBJECTIVE Temp:  [97.4 F (36.3 C)-99.3 F (37.4 C)] 98.1 F (36.7 C) (04/18 0942) Pulse Rate:  [71-77] 72 (04/18 0942) Cardiac Rhythm:  [-] Normal sinus rhythm;Heart block (04/18 0726) Resp:  [16-18] 18 (04/18 0942) BP: (115-174)/(51-71) 135/51 mmHg (04/18 0942) SpO2:  [99 %-100 %] 99 % (04/18 0942)  CBC:   Recent Labs Lab 11/14/15 2146  11/16/15 0442 11/17/15 0801  WBC 8.7  --  8.0 6.1  NEUTROABS 5.5  --   --   --   HGB 11.8*  < > 12.0 12.4  HCT 37.2  < > 38.6 39.4  MCV 87.5  --  88.7 87.4  PLT 256  --  259 242  < > = values in this interval not displayed. Basic Metabolic Panel:   Recent Labs Lab 11/16/15 0442 11/17/15 0801  NA 139 139  K 4.4 4.1  CL 103 102  CO2 26 26  GLUCOSE 128* 110*  BUN 16 14  CREATININE 0.96 0.93  CALCIUM 9.8 9.9   Lipid Panel:     Component Value Date/Time   CHOL 260* 11/15/2015 0500   TRIG 263* 11/15/2015 0500   HDL 44 11/15/2015 0500   CHOLHDL 5.9 11/15/2015 0500   VLDL 53* 11/15/2015 0500   LDLCALC 163* 11/15/2015 0500   HgbA1c:  Lab Results  Component Value Date   HGBA1C 6.0* 11/15/2015   Urine Drug Screen:     Component Value Date/Time   LABOPIA NONE DETECTED 08/20/2015 0903   COCAINSCRNUR NONE DETECTED 08/20/2015 0903   LABBENZ NONE DETECTED 08/20/2015 0903   AMPHETMU NONE DETECTED 08/20/2015 0903   THCU NONE DETECTED 08/20/2015 0903   LABBARB NONE DETECTED 08/20/2015 0903     IMAGING  Ct Head Wo Contrast 11/16/2015  Stable exam. Normal head CT. No new acute intracranial abnormality identified. 11/14/2015   Normal brain.    PHYSICAL EXAM Neurologic Examination:  Mental Status:  Alert, oriented, thought content appropriate. Speech without evidence of dysarthria or aphasia. Able to follow 3 step commands  without difficulty.  Cranial Nerves:  II-bilateral visual fields intact III/IV/VI-Pupils were equal and reacted. Extraocular movements were full.  V/VII-no facial numbness and no facial weakness.  VIII-hearing normal.  X-normal speech and symmetrical palatal movement.  XII-midline tongue extension  Motor: 5/5 strength symmetrical throughout.  Muscle tone normal throughout. Sensory: Intact to light touch in all extremities. Deep Tendon Reflexes: 2/4 throughout Plantars: Downgoing bilaterally  Cerebellar: Normal finger to nose and heel to shin bilaterally. Gait: not tested    ASSESSMENT/PLAN Stacey [name] is a 55 y.o. female with history of hypertension, depression, diabetes mellitus, previous pancreatitis, previous C. difficile colitis, neuropathy, history of DVT, history of pulmonary embolus, chronic Coumadin therapy presenting with left facial weakness and numbness. 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 Today's INR 2.15  Patient counseled to be compliant with her antithrombotic medications  Ongoing aggressive stroke risk factor management  Therapy recommendations:  HH PT and HH OT with bariatric bed and recliner  Disposition:  Return home  Hypertension  Stable  Permissive hypertension (OK if < 220/120) but gradually normalize in 5-7 days  Hyperlipidemia  Home meds: Lipitor 40 mg daily prior to admission  LDL 163, goal < 70  Lipitor increased to 80 mg daily  Continue statin at  discharge  Diabetes  HgbA1c 6.0, at goal < 7.0  Other Stroke Risk Factors  Morbid Obesity, Body mass index is 69.81 kg/(m^2).   Family hx stroke   Hx DVT with resultant PE, on coumadin PTA  obstructive sleep apnea,  CPAP  Other Active Problems  Contrast allergy -> nausea and vomiting  Pharmacy dosing Coumadin  Hospital day # 3   I have personally examined this patient, reviewed notes, independently viewed imaging studies, participated in medical decision making and plan of care. I have made any additions or clarifications directly to the above note.  Plan to check transcranial Doppler bubble study to look for a right to left cardiac shunt as if we find this she may need consideration for endovascular closure.    Antony Contras, MD Medical Director [place] Pager: [phone] 11/17/2015 2:04 PM  To contact Stroke Continuity provider, please refer to [email or web address] After hours, contact General Neurology

## 2015-11-17 NOTE — Progress Notes (Signed)
*  PRELIMINARY RESULTS* Vascular Ultrasound Carotid Duplex (Doppler) has been completed.  Preliminary findings: Bilateral: No significant (1-39%) ICA stenosis. Antegrade vertebral flow.    Landry Mellow, RDMS, RVT  11/17/2015, 2:04 PM

## 2015-11-19 ENCOUNTER — Telehealth: Payer: Self-pay | Admitting: Neurology

## 2015-11-19 NOTE — Telephone Encounter (Signed)
Rn call patient back about her memory and getting words out. Patient was just discharge on 11/17/2015. Rn stated she was stable to be discharge per [facility] notes. Rn ask patient did she tell the nurse, MD or staff about her memory issues. Pt stated she did not tell them. Rn ask patient was any family in the room when she was discharge.Pt stated she had a family member, but they did not ask any questions. Rn stated to patient she had a stroke, and having some mild memory issues, and getting words out is common and normal. Pt stated she was alone at the hospital and no family was there when the medical team came in. Rn stated she has a follow up appointment with Dr. Leonie Man. Rn explain she was just discharge 4/18.2017 and having some side effects from the stroke is common. Pt verbalized understanding.

## 2015-11-19 NOTE — Telephone Encounter (Signed)
Pt called and scheduled f/u from hospital and she had a question she said she is having some memory issues and can not get some words out sometimes she wants to know if this is normal. She will be leaving for an appt around 10:00 thanks dg

## 2015-12-01 ENCOUNTER — Other Ambulatory Visit: Payer: Self-pay | Admitting: Nurse Practitioner

## 2015-12-01 DIAGNOSIS — R0609 Other forms of dyspnea: Principal | ICD-10-CM

## 2016-02-01 ENCOUNTER — Ambulatory Visit: Payer: Medicaid Other | Admitting: Neurology

## 2016-02-03 ENCOUNTER — Encounter: Payer: Self-pay | Admitting: Neurology

## 2016-02-07 ENCOUNTER — Emergency Department (HOSPITAL_COMMUNITY)
Admission: EM | Admit: 2016-02-07 | Discharge: 2016-02-07 | Disposition: A | Discharge status: Home / Self Care | Payer: Medicaid Other | Attending: Emergency Medicine | Admitting: Emergency Medicine

## 2016-02-07 ENCOUNTER — Encounter (HOSPITAL_COMMUNITY): Payer: Self-pay | Admitting: Emergency Medicine

## 2016-02-07 DIAGNOSIS — R42 Dizziness and giddiness: Secondary | ICD-10-CM | POA: Insufficient documentation

## 2016-02-07 DIAGNOSIS — J45909 Unspecified asthma, uncomplicated: Secondary | ICD-10-CM | POA: Insufficient documentation

## 2016-02-07 DIAGNOSIS — Z7901 Long term (current) use of anticoagulants: Secondary | ICD-10-CM | POA: Insufficient documentation

## 2016-02-07 DIAGNOSIS — I10 Essential (primary) hypertension: Secondary | ICD-10-CM | POA: Insufficient documentation

## 2016-02-07 DIAGNOSIS — Z87891 Personal history of nicotine dependence: Secondary | ICD-10-CM | POA: Diagnosis not present

## 2016-02-07 DIAGNOSIS — E119 Type 2 diabetes mellitus without complications: Secondary | ICD-10-CM | POA: Diagnosis not present

## 2016-02-07 DIAGNOSIS — R040 Epistaxis: Secondary | ICD-10-CM | POA: Diagnosis present

## 2016-02-07 DIAGNOSIS — M1990 Unspecified osteoarthritis, unspecified site: Secondary | ICD-10-CM | POA: Insufficient documentation

## 2016-02-07 DIAGNOSIS — Z79899 Other long term (current) drug therapy: Secondary | ICD-10-CM | POA: Insufficient documentation

## 2016-02-07 DIAGNOSIS — R791 Abnormal coagulation profile: Secondary | ICD-10-CM | POA: Insufficient documentation

## 2016-02-07 DIAGNOSIS — F329 Major depressive disorder, single episode, unspecified: Secondary | ICD-10-CM | POA: Diagnosis not present

## 2016-02-07 LAB — CBC WITH DIFFERENTIAL/PLATELET
Basophils Absolute: 0 10*3/uL (ref 0.0–0.1)
Basophils Relative: 0 %
Eosinophils Absolute: 0.2 10*3/uL (ref 0.0–0.7)
Eosinophils Relative: 2 %
HEMATOCRIT: 38.8 % (ref 36.0–46.0)
HEMOGLOBIN: 12.5 g/dL (ref 12.0–15.0)
LYMPHS PCT: 26 %
Lymphs Abs: 2.2 10*3/uL (ref 0.7–4.0)
MCH: 28.2 pg (ref 26.0–34.0)
MCHC: 32.2 g/dL (ref 30.0–36.0)
MCV: 87.6 fL (ref 78.0–100.0)
MONO ABS: 0.6 10*3/uL (ref 0.1–1.0)
MONOS PCT: 7 %
NEUTROS ABS: 5.5 10*3/uL (ref 1.7–7.7)
Neutrophils Relative %: 65 %
Platelets: 279 10*3/uL (ref 150–400)
RBC: 4.43 MIL/uL (ref 3.87–5.11)
RDW: 12.9 % (ref 11.5–15.5)
WBC: 8.5 10*3/uL (ref 4.0–10.5)

## 2016-02-07 LAB — CBC
HEMATOCRIT: 37.1 % (ref 36.0–46.0)
HEMOGLOBIN: 12 g/dL (ref 12.0–15.0)
MCH: 28.2 pg (ref 26.0–34.0)
MCHC: 32.3 g/dL (ref 30.0–36.0)
MCV: 87.3 fL (ref 78.0–100.0)
Platelets: 247 10*3/uL (ref 150–400)
RBC: 4.25 MIL/uL (ref 3.87–5.11)
RDW: 12.9 % (ref 11.5–15.5)
WBC: 8.9 10*3/uL (ref 4.0–10.5)

## 2016-02-07 LAB — PROTIME-INR
INR: 3.92 — ABNORMAL HIGH (ref 0.00–1.49)
PROTHROMBIN TIME: 37.4 s — AB (ref 11.6–15.2)

## 2016-02-07 NOTE — ED Provider Notes (Signed)
CSN: [id number]     Arrival date & time 02/07/16  1614 History   First MD Initiated Contact with Patient 02/07/16 1837     Chief Complaint  Patient presents with  . Epistaxis     (Consider location/radiation/quality/duration/timing/severity/associated sxs/prior Treatment) HPI  55 year old female presents with concerns for epistaxis. She states when she got out of bed this morning around 10 AM she noticed some clot in her nose. Ever she would blow her nose blood would come out. Patient feels like blood is running down the back of her throat. Later in the day she started feeling like she had an upset stomach and a little bit of pain. She has not had any vomiting or hematemesis. Coughing or hemoptysis. Has never noticed blood coming out of her anterior nares. Is not currently lightheaded or dizzy but has had some intermittently. She is on warfarin for prior PE. Called her doctor who told her to come into the ER for evaluation.  Past Medical History  Diagnosis Date  . Hypertension   . Depression   . Type 2 diabetes mellitus ([place])   . Diverticulitis   . Asthma   . Cyst     pt has "cyst" to legs for years, areas drain at time  . Pancreatitis   . Arthritis   . C. difficile colitis 11/08/2011  . Musculoskeletal pain 11/08/2011  . Neuropathic pain 11/10/2011  . Sleep apnea with use of continuous positive airway pressure (CPAP)     uses CPAP at night  . 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 DVT (deep venous thrombosis) ([place])   . Pulmonary embolism ([place])   . Hernia, inguinal    Past Surgical History  Procedure Laterality Date  . Tonsillectomy    . Cholecystectomy    . Cesarean section    . Nose surgery    . Skin biopsy     Family History  Problem Relation Age of Onset  . Stroke Other   . Cancer Other   . Diabetes Other    Social History  Substance Use Topics  . Smoking status: Former Research scientist (life sciences)  . Smokeless tobacco: Never Used  . Alcohol Use: No   OB  History    Gravida Para Term Preterm AB TAB SAB Ectopic Multiple Living   3 2 2  1  1   2      Review of Systems  Constitutional: Negative for fever.  HENT: Positive for nosebleeds.   Respiratory: Negative for shortness of breath.   Cardiovascular: Negative for chest pain.  Gastrointestinal: Negative for nausea and vomiting.  Neurological: Positive for dizziness.  All other systems reviewed and are negative.     Allergies  Hydrocodone; Nitroglycerin; Tramadol; Hydromorphone; Levaquin; Toradol; Levofloxacin; Sulfa antibiotics; Tape; and Trazodone and nefazodone  Home Medications   Prior to Admission medications   Medication Sig Start Date End Date Taking? Authorizing Provider  albuterol (PROVENTIL HFA;VENTOLIN HFA) 108 (90 BASE) MCG/ACT inhaler Inhale 2 puffs into the lungs every 4 (four) hours as needed for wheezing or shortness of breath. 07/19/13   Michiel Cowboy, MD  atorvastatin (LIPITOR) 80 MG tablet Take 1 tablet (80 mg total) by mouth daily. Reported on 11/14/2015 11/17/15   Theodis Blaze, MD  buPROPion (WELLBUTRIN XL) 300 MG 24 hr tablet Take 300 mg by mouth daily.     Historical Provider, MD  busPIRone (BUSPAR) 15 MG tablet Take 2 tablets (30 mg total) by mouth 2 (two) times daily. 08/15/13  Dixon Boos, MD  escitalopram (LEXAPRO) 20 MG tablet Take 10 mg by mouth 2 (two) times daily.     Historical Provider, MD  hydrochlorothiazide (HYDRODIURIL) 25 MG tablet Take 25 mg by mouth daily. Reported on 11/14/2015    Historical Provider, MD  lisinopril (PRINIVIL,ZESTRIL) 10 MG tablet Take 10 mg by mouth daily.     Historical Provider, MD  LORazepam (ATIVAN) 2 MG tablet Take 2 mg by mouth 3 (three) times daily as needed. 11/02/15   Historical Provider, MD  nystatin cream (MYCOSTATIN) Apply 1 application topically 2 (two) times daily as needed for dry skin.  03/03/15   Historical Provider, MD  oxyCODONE-acetaminophen (PERCOCET) 5-325 MG tablet Take 2 tablets by mouth every 6 (six)  hours as needed for moderate pain or severe pain. 10/09/15   Earleen Newport, MD  warfarin (COUMADIN) 10 MG tablet Take 1 tablet (10 mg total) by mouth every morning. Resume your coumadin 10 mg tablet starting 11/17/2017. You will get one dose today 11/17/2015 before discharge 11/18/15   Theodis Blaze, MD   BP 131/47 mmHg  Pulse 82  Temp(Src) 98.9 F (37.2 C) (Oral)  Resp 22  Ht 5\' 6"  (1.676 m)  Wt 402 lb (182.346 kg)  BMI 64.92 kg/m2  SpO2 96%  LMP 05/21/2014 Physical Exam  Constitutional: She is oriented to person, place, and time. She appears well-developed and well-nourished.  Morbidly obese  HENT:  Head: Normocephalic and atraumatic.  Right Ear: External ear normal.  Left Ear: External ear normal.  Nose: Nose normal. No nasal septal hematoma. No epistaxis.  Mouth/Throat: Oropharynx is clear and moist. No oropharyngeal exudate.  Small sports of dried blood in right anterior nare but no bleeding. No clot. No change after blowing nose No obvious bleeding or blood in oropharynx  Eyes: Right eye exhibits no discharge. Left eye exhibits no discharge.  Cardiovascular: Normal rate, regular rhythm and normal heart sounds.   Pulmonary/Chest: Effort normal and breath sounds normal.  Abdominal: Soft. There is no tenderness.  Neurological: She is alert and oriented to person, place, and time.  Skin: Skin is warm and dry.  Nursing note and vitals reviewed.   ED Course  Procedures (including critical care time) Labs Review Labs Reviewed  PROTIME-INR - Abnormal; Notable for the following:    Prothrombin Time 37.4 (*)    INR 3.92 (*)    All other components within normal limits  CBC WITH DIFFERENTIAL/PLATELET  CBC    Imaging Review No results found. I have personally reviewed and evaluated these images and lab results as part of my medical decision-making.   EKG Interpretation None      MDM   Final diagnoses:  Epistaxis  Supratherapeutic INR  Dizziness    There is no  obvious active bleeding. Patient reports no melena or bright red blood per act him. No abdominal tenderness. She states her abdomen doesn't feel right but is not really painful. Likely this is from swallowing blood. She specifically states that the blood seems to started in her posterior oropharynx and it feels like she is swallowing it. However there appears to be no bleeding coming out of her nose. She has no headache although she does feel intermittently dizzy. However with no headache or hard neurologic signs or do not think a CT head is indicated. At this point I have a very low suspicion of significant bleeding. Her hemoglobin went from 12.5-12 and 4 hours. I do not think this represents significant blood  loss. At this point I feel like she is stable for discharge home to follow-up closely with PCP. Advised to hold Coumadin given her supratherapeutic INR.    Sherwood Gambler, MD 02/08/16 [phone]

## 2016-02-07 NOTE — ED Notes (Signed)
Assisted patient with bedside commode.  

## 2016-02-07 NOTE — ED Notes (Signed)
Patient c/o nose bleed. Per patient started this morning at 10. Patient states "It started out feeling like a trickle and then when I would blow my nose a large amount of blood would come out." Per patient felt it clotting and now blood is draining in back of throat. Patient told to come to hospital by cardiologist because she takes coumadin. Per patient has had intermittent dizziness.

## 2016-02-07 NOTE — Discharge Instructions (Signed)
Your hemoglobin is 12.0. Your INR is too high at 3.9. Do not take your coumadin tonight. Call your doctor tomorrow for further Coumadin instructions. If you develop recurrent bleeding or worse dizziness come back to the ER immediately. If you develop headaches or other bleeding come back immediately as well.

## 2016-03-29 ENCOUNTER — Emergency Department: Payer: Medicaid Other

## 2016-03-29 ENCOUNTER — Emergency Department
Admission: EM | Admit: 2016-03-29 | Discharge: 2016-03-29 | Disposition: A | Discharge status: Home / Self Care | Payer: Medicaid Other | Attending: Emergency Medicine | Admitting: Emergency Medicine

## 2016-03-29 ENCOUNTER — Encounter: Payer: Self-pay | Admitting: Emergency Medicine

## 2016-03-29 DIAGNOSIS — J45909 Unspecified asthma, uncomplicated: Secondary | ICD-10-CM | POA: Diagnosis not present

## 2016-03-29 DIAGNOSIS — M79661 Pain in right lower leg: Secondary | ICD-10-CM | POA: Insufficient documentation

## 2016-03-29 DIAGNOSIS — Z79899 Other long term (current) drug therapy: Secondary | ICD-10-CM | POA: Insufficient documentation

## 2016-03-29 DIAGNOSIS — E119 Type 2 diabetes mellitus without complications: Secondary | ICD-10-CM | POA: Diagnosis not present

## 2016-03-29 DIAGNOSIS — M79669 Pain in unspecified lower leg: Secondary | ICD-10-CM

## 2016-03-29 DIAGNOSIS — I10 Essential (primary) hypertension: Secondary | ICD-10-CM | POA: Insufficient documentation

## 2016-03-29 DIAGNOSIS — M7989 Other specified soft tissue disorders: Secondary | ICD-10-CM | POA: Insufficient documentation

## 2016-03-29 DIAGNOSIS — Z87891 Personal history of nicotine dependence: Secondary | ICD-10-CM | POA: Insufficient documentation

## 2016-03-29 DIAGNOSIS — M79604 Pain in right leg: Secondary | ICD-10-CM

## 2016-03-29 LAB — COMPREHENSIVE METABOLIC PANEL
ALBUMIN: 4 g/dL (ref 3.5–5.0)
ALT: 19 U/L (ref 14–54)
AST: 24 U/L (ref 15–41)
Alkaline Phosphatase: 64 U/L (ref 38–126)
Anion gap: 7 (ref 5–15)
BUN: 19 mg/dL (ref 6–20)
CHLORIDE: 106 mmol/L (ref 101–111)
CO2: 25 mmol/L (ref 22–32)
CREATININE: 0.93 mg/dL (ref 0.44–1.00)
Calcium: 9.2 mg/dL (ref 8.9–10.3)
GFR calc non Af Amer: >60 mL/min (ref >60)
Glucose, Bld: 124 mg/dL — ABNORMAL HIGH (ref 65–99)
Potassium: 3.9 mmol/L (ref 3.5–5.1)
SODIUM: 138 mmol/L (ref 135–145)
Total Bilirubin: 0.6 mg/dL (ref 0.3–1.2)
Total Protein: 7.4 g/dL (ref 6.5–8.1)

## 2016-03-29 LAB — URINALYSIS COMPLETE WITH MICROSCOPIC (ARMC ONLY)
Bilirubin Urine: NEGATIVE
Glucose, UA: NEGATIVE mg/dL
Hgb urine dipstick: NEGATIVE
KETONES UR: NEGATIVE mg/dL
Leukocytes, UA: NEGATIVE
Nitrite: NEGATIVE
PH: 5 (ref 5.0–8.0)
PROTEIN: NEGATIVE mg/dL
SPECIFIC GRAVITY, URINE: 1.027 (ref 1.005–1.030)

## 2016-03-29 LAB — CBC
HEMATOCRIT: 36.6 % (ref 35.0–47.0)
HEMOGLOBIN: 12.6 g/dL (ref 12.0–16.0)
MCH: 29 pg (ref 26.0–34.0)
MCHC: 34.4 g/dL (ref 32.0–36.0)
MCV: 84.3 fL (ref 80.0–100.0)
PLATELETS: 213 10*3/uL (ref 150–440)
RBC: 4.34 MIL/uL (ref 3.80–5.20)
RDW: 13.4 % (ref 11.5–14.5)
WBC: 5.3 10*3/uL (ref 3.6–11.0)

## 2016-03-29 LAB — PROTIME-INR
INR: 2.05
Prothrombin Time: 23.4 seconds — ABNORMAL HIGH (ref 11.4–15.2)

## 2016-03-29 MED ORDER — OXYCODONE-ACETAMINOPHEN 5-325 MG PO TABS: 1.0000 | ORAL_TABLET | Freq: Four times a day (QID) | ORAL | 0 refills | Status: DC | PRN

## 2016-03-29 NOTE — ED Provider Notes (Addendum)
[facility] Emergency Department Provider Note  ____________________________________________   I have reviewed the triage vital signs and the nursing notes.   HISTORY  Chief Complaint Leg Pain    HPI Stacey [name] is a 55 y.o. female who presents today complaining of right leg pain. She is morbid obese. 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 Is on Coumadin for that. States that the pain shoots to her leg "like lightning". Patient has had a long history of sciatica. She denies incontinence of bowel or bladder. She denies any fever or chills. She denies any back pain or fall. She denies any trauma. The pain is "everywhere" in her leg. She also has a history of neuropathic pain.      Past Medical History:  Diagnosis Date  . Arthritis   . Asthma   . 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 C. difficile colitis 11/08/2011  . Cyst    pt has "cyst" to legs for years, areas drain at time  . Depression   . Diverticulitis   . DVT (deep venous thrombosis) ([place])   . Hernia, inguinal   . Hypertension   . Musculoskeletal pain 11/08/2011  . Neuropathic pain 11/10/2011  . Pancreatitis   . Pulmonary embolism ([place])   . Sleep apnea with use of continuous positive airway pressure (CPAP)    uses CPAP at night  . Type 2 diabetes mellitus [facility])     Patient Active Problem List   Diagnosis Date Noted  . TIA (transient ischemic attack) 11/15/2015  . Facial droop 11/14/2015  . Chest pain 08/14/2013  . Atypical chest pain 08/14/2013  . Localized swelling, mass, or lump of right lower extremity 08/14/2013  . Pulmonary embolism 07/16/2013  . Short of breath on exertion 07/16/2013  . Cellulitis of leg, right 06/03/2013  . Anemia 02/18/2013  . Diabetes mellitus type II, controlled ([place]) 02/17/2013  . 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 Leukocytosis 02/07/2013  . LLQ abdominal pain 10/28/2012  . UTI (lower  urinary tract infection) 10/28/2012  . Dyspnea 10/28/2012  . OSA (obstructive sleep apnea) 10/28/2012  . RLQ abdominal pain 06/20/2012  . Neuropathic pain 11/10/2011  . C. difficile colitis 11/08/2011  . Musculoskeletal pain 11/08/2011  . Bradycardia 11/08/2011  . PVC's (premature ventricular contractions) 11/07/2011  . Chest pain, atypical 11/07/2011  . Gastroenteritis 11/06/2011  . Heart palpitations 11/06/2011  . Morbid obesity ([place]) 11/06/2011  . Anxiety and depression 11/06/2011  . HTN (hypertension) 11/06/2011  . Depression 11/06/2011    Past Surgical History:  Procedure Laterality Date  . CESAREAN SECTION    . CHOLECYSTECTOMY    . NOSE SURGERY    . SKIN BIOPSY    . TONSILLECTOMY      Prior to Admission medications   Medication Sig Start Date End Date Taking? Authorizing Provider  atorvastatin (LIPITOR) 40 MG tablet Take 40 mg by mouth daily.   Yes Historical Provider, MD  buPROPion (WELLBUTRIN XL) 300 MG 24 hr tablet Take 300 mg by mouth daily.    Yes Historical Provider, MD  busPIRone (BUSPAR) 15 MG tablet Take 2 tablets (30 mg total) by mouth 2 (two) times daily. 08/15/13  Yes Dixon Boos, MD  escitalopram (LEXAPRO) 20 MG tablet Take 20 mg by mouth daily.    Yes Historical Provider, MD  lisinopril (PRINIVIL,ZESTRIL) 10 MG tablet Take 10 mg by mouth daily.    Yes Historical  Provider, MD  LORazepam (ATIVAN) 2 MG tablet Take 2 mg by mouth 3 (three) times daily as needed for anxiety.  11/02/15  Yes Historical Provider, MD  oxyCODONE-acetaminophen (PERCOCET) 5-325 MG tablet Take 2 tablets by mouth every 6 (six) hours as needed for moderate pain or severe pain. 10/09/15  Yes Earleen Newport, MD  warfarin (COUMADIN) 10 MG tablet Take 1 tablet (10 mg total) by mouth every morning. Resume your coumadin 10 mg tablet starting 11/17/2017. You will get one dose today 11/17/2015 before discharge Patient taking differently: Take 10 mg by mouth daily.  11/18/15  Yes Theodis Blaze,  MD  albuterol (PROVENTIL HFA;VENTOLIN HFA) 108 (90 BASE) MCG/ACT inhaler Inhale 2 puffs into the lungs every 4 (four) hours as needed for wheezing or shortness of breath. 07/19/13   Michiel Cowboy, MD  atorvastatin (LIPITOR) 80 MG tablet Take 1 tablet (80 mg total) by mouth daily. Reported on 11/14/2015 Patient not taking: Reported on 02/07/2016 11/17/15   Theodis Blaze, MD  nystatin cream (MYCOSTATIN) Apply 1 application topically 2 (two) times daily as needed for dry skin.  03/03/15   Historical Provider, MD    Allergies Hydrocodone; Nitroglycerin; Tramadol; Hydromorphone; Levaquin [levofloxacin in d5w]; Toradol [ketorolac tromethamine]; Levofloxacin; Sulfa antibiotics; Tape; and Trazodone and nefazodone  Family History  Problem Relation Age of Onset  . Stroke Other   . Cancer Other   . Diabetes Other     Social History Social History  Substance Use Topics  . Smoking status: Former Research scientist (life sciences)  . Smokeless tobacco: Never Used  . Alcohol use No    Review of Systems }Constitutional: No fever/chills Eyes: No visual changes. ENT: No sore throat. No stiff neck no neck pain Cardiovascular: Denies chest pain. Respiratory: Denies shortness of breath. Gastrointestinal:   no vomiting.  No diarrhea.  No constipation. Genitourinary: Negative for dysuria. Musculoskeletal: Negative lower extremity swelling Skin: Negative for rash. Neurological: Negative for severe headaches, focal weakness or numbness. 10-point ROS otherwise negative.  ____________________________________________   PHYSICAL EXAM:  VITAL SIGNS: ED Triage Vitals  Enc Vitals Group     BP 03/29/16 0803 (!) 152/51     Pulse Rate 03/29/16 0803 65     Resp 03/29/16 0803 18     Temp 03/29/16 0803 97.8 F (36.6 C)     Temp Source 03/29/16 0803 Oral     SpO2 03/29/16 0803 98 %     Weight 03/29/16 0803 (!) 409 lb 9 oz (185.8 kg)     Height 03/29/16 0803 5\' 6"  (1.676 m)     Head Circumference --      Peak Flow --      Pain  Score 03/29/16 0751 8     Pain Loc --      Pain Edu? --      Excl. in [place]? --     Constitutional: Alert and oriented. Well appearing and in no acute distress. Eyes: Conjunctivae are normal. PERRL. EOMI. Head: Atraumatic. Nose: No congestion/rhinnorhea. Mouth/Throat: Mucous membranes are moist.  Oropharynx non-erythematous. Neck: No stridor.   Nontender with no meningismus Cardiovascular: Normal rate, regular rhythm. Grossly normal heart sounds.  Good peripheral circulation. Respiratory: Normal respiratory effort.  No retractions. Lungs CTAB. Abdominal: Soft and nontender. No distention. No guarding no rebound significant morbid obesity. Back:  There is no focal tenderness or step off.  there is no midline tenderness there are no lesions noted. there is no CVA tenderness morbid obesity limits the exam Musculoskeletal: No lower  extremity tenderness, no upper extremity tenderness. No joint effusions, no DVT signs strong distal pulses no edema I cannot reproduce her pain but morbid obesity limits exam Neurologic:  Normal speech and language. No gross focal neurologic deficits are appreciated.  Skin:  Skin is warm, dry and intact. No rash noted. Psychiatric: Mood and affect are normal. Speech and behavior are normal.  ____________________________________________   LABS (all labs ordered are listed, but only abnormal results are displayed)  Labs Reviewed  COMPREHENSIVE METABOLIC PANEL - Abnormal; Notable for the following:       Result Value   Glucose, Bld 124 (*)    All other components within normal limits  PROTIME-INR - Abnormal; Notable for the following:    Prothrombin Time 23.4 (*)    All other components within normal limits  URINALYSIS COMPLETEWITH MICROSCOPIC ([facility] ONLY) - Abnormal; Notable for the following:    Color, Urine YELLOW (*)    APPearance HAZY (*)    Bacteria, UA RARE (*)    Squamous Epithelial / LPF 0-5 (*)    All other components within normal limits  URINE  CULTURE  CBC   ____________________________________________  EKG  I personally interpreted any EKGs ordered by me or triage  ____________________________________________  RADIOLOGY  I reviewed any imaging ordered by me or triage that were performed during my shift and, if possible, patient and/or family made aware of any abnormal findings. ____________________________________________   PROCEDURES  Procedure(s) performed: None  Procedures  Critical Care performed: None  ____________________________________________   INITIAL IMPRESSION / ASSESSMENT AND PLAN / ED COURSE  Pertinent labs & imaging results that were available during my care of the patient were reviewed by me and considered in my medical decision making (see chart for details).  Patient here because she has recurrent sciatica and she is complaining of pain that shoots down her leg but she feels a little bit different from her normal sciatica. There is no evidence of cauda equina syndrome. She's had no incontinence of bowel or bladder. His been no trauma and it is no focal lesion. Very significant morbid obesity significantly limits my ability to evaluate patient at baseline unfortunately. Ultrasound shows no evidence of DVT blood work is reassuring, we did do a urinalysis as after I told her DVT was negative she wondered if perhaps she was having a urine infection even though she initially did not tell me and actually denied dysuria. Urinalysis was therefore obtained which is negative. Patient is in no acute distress.  ----------------------------------------- 2:39 PM on 03/29/2016 -----------------------------------------  Did offer her x-ray of her hip even though she doesn't have any hip pain and is sitting with no difficulty in her wheelchair, she declined hip x-ray as she would like to go home. I did explain to her that without further imaging I cannot definitively rule out other entities that could be causing  her to have this pain. She has no midline back pain. She is really complaining of a sciatic-like syndrome with pain that shoots down her buttock and down her leg. She is however neurologically intact with good pulses nothing is just PVD nothing to suggest cauda equina etc. I have for her pain medication but she declines as she is driving. Patient remains quite well-appearing with no evidence of acute distress. I have suggested very strongly that she follow closely with her primary care doctor tomorrow. She will do so.  ----------------------------------------- 3:25 PM on 03/29/2016 -----------------------------------------  Patient in no acute distress, she is driving so  she cannot take any pain medication here. She states she is not allergic to oxycodone, she is requesting primary medication. Initially I try to send her home with just oral nonsteroidal pain medication but she states that we'll touch or pain when this gets bad that she needs Percocet or something similar. She is in no acute distress Jeannine Kitten home with a very small dose of Percocet to keep her for the next day or 2. She understands that if she has an allergic reaction to hydrocodone there is always a chance she could have an allergic reaction to oxycodone as well and she understands this but states she tolerates it well and has never had a problem with that. She understands what she should do if she does have an allergic reaction.   Clinical Course   ____________________________________________   FINAL CLINICAL IMPRESSION(S) / ED DIAGNOSES  Final diagnoses:  Pain and swelling of lower leg      This chart was dictated using voice recognition software.  Despite best efforts to proofread,  errors can occur which can change meaning.      Schuyler Amor, MD 03/29/16 [place], MD 03/29/16 [place], MD 03/29/16 [phone]

## 2016-03-29 NOTE — ED Notes (Addendum)
Pt resting in bed,alert, oriented .  Taken to [employer].

## 2016-03-29 NOTE — ED Notes (Signed)
Pt returned from [place], resting in bed, updated on plan of care

## 2016-03-29 NOTE — ED Notes (Signed)
Pt resting in bed, resp even and unlabored 

## 2016-03-29 NOTE — ED Triage Notes (Signed)
Pt to ed with c/o right leg pain.  Pt states pain starts in lower right leg and radiates up into right rib area.  Hx of blood clots,  Pt states she is currently on coumadin and has not missed any of her meds. Pt also states she has had a cough.

## 2016-03-29 NOTE — ED Notes (Signed)
Patient transported to Ultrasound 

## 2016-03-30 LAB — URINE CULTURE

## 2016-05-24 ENCOUNTER — Telehealth (INDEPENDENT_AMBULATORY_CARE_PROVIDER_SITE_OTHER): Payer: Self-pay | Admitting: Vascular Surgery

## 2016-05-24 NOTE — Telephone Encounter (Signed)
Patient calling because she said her pharmacy attempted to get a refill on her warfarin and it was denied with no explanation. I advised her I did not have documentation of this but would have it looked into. Spoke with JD and he states he signed off on it and gave it to Mickel Baas so he is not sure why the patient is having a problem.

## 2016-05-25 NOTE — Telephone Encounter (Signed)
This was faxed earlier but I will have Hezzie Bump fill out the faxed prescription form again and refax it.

## 2016-05-26 ENCOUNTER — Telehealth (INDEPENDENT_AMBULATORY_CARE_PROVIDER_SITE_OTHER): Payer: Self-pay | Admitting: Vascular Surgery

## 2016-05-26 NOTE — Telephone Encounter (Signed)
I refaxed the signed prescription from 05/25/16.

## 2016-05-26 NOTE — Telephone Encounter (Signed)
[facility] called and stated they have no signature on the refill for warfarin. Please call [phone]

## 2016-06-05 ENCOUNTER — Emergency Department
Admission: EM | Admit: 2016-06-05 | Discharge: 2016-06-05 | Disposition: A | Discharge status: Home / Self Care | Payer: Medicaid Other | Attending: Emergency Medicine | Admitting: Emergency Medicine

## 2016-06-05 ENCOUNTER — Encounter: Payer: Self-pay | Admitting: Emergency Medicine

## 2016-06-05 DIAGNOSIS — I10 Essential (primary) hypertension: Secondary | ICD-10-CM | POA: Diagnosis not present

## 2016-06-05 DIAGNOSIS — Z7901 Long term (current) use of anticoagulants: Secondary | ICD-10-CM | POA: Diagnosis not present

## 2016-06-05 DIAGNOSIS — T7840XA Allergy, unspecified, initial encounter: Secondary | ICD-10-CM | POA: Diagnosis present

## 2016-06-05 DIAGNOSIS — Z87891 Personal history of nicotine dependence: Secondary | ICD-10-CM | POA: Diagnosis not present

## 2016-06-05 DIAGNOSIS — J45909 Unspecified asthma, uncomplicated: Secondary | ICD-10-CM | POA: Diagnosis not present

## 2016-06-05 DIAGNOSIS — Z79899 Other long term (current) drug therapy: Secondary | ICD-10-CM | POA: Diagnosis not present

## 2016-06-05 DIAGNOSIS — E119 Type 2 diabetes mellitus without complications: Secondary | ICD-10-CM | POA: Diagnosis not present

## 2016-06-05 LAB — CBC WITH DIFFERENTIAL/PLATELET
BASOS ABS: 0.1 10*3/uL (ref 0–0.1)
BASOS PCT: 1 %
EOS ABS: 0.2 10*3/uL (ref 0–0.7)
Eosinophils Relative: 2 %
HCT: 34.3 % — ABNORMAL LOW (ref 35.0–47.0)
Hemoglobin: 11.8 g/dL — ABNORMAL LOW (ref 12.0–16.0)
Lymphocytes Relative: 26 %
Lymphs Abs: 2 10*3/uL (ref 1.0–3.6)
MCH: 29 pg (ref 26.0–34.0)
MCHC: 34.5 g/dL (ref 32.0–36.0)
MCV: 84.1 fL (ref 80.0–100.0)
MONO ABS: 0.6 10*3/uL (ref 0.2–0.9)
MONOS PCT: 8 %
NEUTROS PCT: 63 %
Neutro Abs: 4.9 10*3/uL (ref 1.4–6.5)
Platelets: 213 10*3/uL (ref 150–440)
RBC: 4.07 MIL/uL (ref 3.80–5.20)
RDW: 13.3 % (ref 11.5–14.5)
WBC: 7.7 10*3/uL (ref 3.6–11.0)

## 2016-06-05 LAB — PROTIME-INR
INR: 2.22
PROTHROMBIN TIME: 25 s — AB (ref 11.4–15.2)

## 2016-06-05 LAB — BASIC METABOLIC PANEL
ANION GAP: 5 (ref 5–15)
BUN: 16 mg/dL (ref 6–20)
CALCIUM: 9 mg/dL (ref 8.9–10.3)
CO2: 27 mmol/L (ref 22–32)
CREATININE: 0.9 mg/dL (ref 0.44–1.00)
Chloride: 107 mmol/L (ref 101–111)
Glucose, Bld: 122 mg/dL — ABNORMAL HIGH (ref 65–99)
Potassium: 3.7 mmol/L (ref 3.5–5.1)
SODIUM: 139 mmol/L (ref 135–145)

## 2016-06-05 LAB — TROPONIN I

## 2016-06-05 MED ORDER — DIPHENHYDRAMINE HCL 25 MG PO CAPS
50.0000 mg | ORAL_CAPSULE | Freq: Once | ORAL | Status: AC
  Administered 2016-06-05: 50 mg via ORAL
  Filled 2016-06-05: qty 2

## 2016-06-05 MED ORDER — PREDNISONE 20 MG PO TABS
60.0000 mg | ORAL_TABLET | Freq: Once | ORAL | Status: AC
  Administered 2016-06-05: 60 mg via ORAL
  Filled 2016-06-05: qty 3

## 2016-06-05 MED ORDER — FAMOTIDINE 20 MG PO TABS
40.0000 mg | ORAL_TABLET | Freq: Once | ORAL | Status: AC
  Administered 2016-06-05: 40 mg via ORAL
  Filled 2016-06-05: qty 2

## 2016-06-05 MED ORDER — EPINEPHRINE 0.3 MG/0.3ML IJ SOAJ: 0.3000 mg | Freq: Once | INTRAMUSCULAR | 0 refills | Status: AC

## 2016-06-05 MED ORDER — PREDNISONE 20 MG PO TABS: 60.0000 mg | ORAL_TABLET | Freq: Once | ORAL | Status: DC

## 2016-06-05 MED ORDER — PREDNISONE 20 MG PO TABS: 40.0000 mg | ORAL_TABLET | Freq: Every day | ORAL | 0 refills | Status: DC

## 2016-06-05 NOTE — ED Triage Notes (Signed)
Reports when to [employer] and drank small sprite and reports tongue started burning. Reports face gets hot. Feels like tongue is swelling in back. Reports trouble swallowing and shortness of breath before coming. Still feels about the same. No distress noted in triage. No visible swelling to tongue. Handling secretions.

## 2016-06-05 NOTE — ED Notes (Signed)
Pt states was at [employer] drinking a sprite and felt like her tongue was swelling up. Pt in nad but does seem slightly anxious. No swelling to tongue or throat. vss

## 2016-06-05 NOTE — ED Provider Notes (Signed)
[facility] Emergency Department Provider Note  ____________________________________________   First MD Initiated Contact with Patient 06/05/16 1700     (approximate)  I have reviewed the triage vital signs and the nursing notes.   HISTORY  Chief Complaint Allergic Reaction   HPI HILAREE ROTRUCK is a 55 y.o. female with a history of morbid obesity and DVT and PE on Coumadin who is present to emergency department with the feeling of tongue swelling as well as throat burning. She says this has been ongoing for the past 3 hours ever since she had some Sprite and ate a sausage biscuit. She denies any known food allergies. Denies any pain at this time. Denies any shortness of breath. Request to have her "Coumadin checked."   Past Medical History:  Diagnosis Date  . Arthritis   . Asthma   . 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 C. difficile colitis 11/08/2011  . Cyst    pt has "cyst" to legs for years, areas drain at time  . Depression   . Diverticulitis   . DVT (deep venous thrombosis) ([place])   . Hernia, inguinal   . Hypertension   . Musculoskeletal pain 11/08/2011  . Neuropathic pain 11/10/2011  . Pancreatitis   . Pulmonary embolism ([place])   . Sleep apnea with use of continuous positive airway pressure (CPAP)    uses CPAP at night  . Type 2 diabetes mellitus [facility])     Patient Active Problem List   Diagnosis Date Noted  . TIA (transient ischemic attack) 11/15/2015  . Facial droop 11/14/2015  . Chest pain 08/14/2013  . Atypical chest pain 08/14/2013  . Localized swelling, mass, or lump of right lower extremity 08/14/2013  . Pulmonary embolism 07/16/2013  . Short of breath on exertion 07/16/2013  . Cellulitis of leg, right 06/03/2013  . Anemia 02/18/2013  . Diabetes mellitus type II, controlled ([place]) 02/17/2013  . 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 Leukocytosis 02/07/2013  . LLQ  abdominal pain 10/28/2012  . UTI (lower urinary tract infection) 10/28/2012  . Dyspnea 10/28/2012  . OSA (obstructive sleep apnea) 10/28/2012  . RLQ abdominal pain 06/20/2012  . Neuropathic pain 11/10/2011  . C. difficile colitis 11/08/2011  . Musculoskeletal pain 11/08/2011  . Bradycardia 11/08/2011  . PVC's (premature ventricular contractions) 11/07/2011  . Chest pain, atypical 11/07/2011  . Gastroenteritis 11/06/2011  . Heart palpitations 11/06/2011  . Morbid obesity ([place]) 11/06/2011  . Anxiety and depression 11/06/2011  . HTN (hypertension) 11/06/2011  . Depression 11/06/2011    Past Surgical History:  Procedure Laterality Date  . CESAREAN SECTION    . CHOLECYSTECTOMY    . NOSE SURGERY    . SKIN BIOPSY    . TONSILLECTOMY      Prior to Admission medications   Medication Sig Start Date End Date Taking? Authorizing Provider  albuterol (PROVENTIL HFA;VENTOLIN HFA) 108 (90 BASE) MCG/ACT inhaler Inhale 2 puffs into the lungs every 4 (four) hours as needed for wheezing or shortness of breath. 07/19/13   Michiel Cowboy, MD  atorvastatin (LIPITOR) 40 MG tablet Take 40 mg by mouth daily.    Historical Provider, MD  atorvastatin (LIPITOR) 80 MG tablet Take 1 tablet (80 mg total) by mouth daily. Reported on 11/14/2015 Patient not taking: Reported on 02/07/2016 11/17/15   Theodis Blaze, MD  buPROPion (WELLBUTRIN XL) 300 MG 24 hr tablet Take 300 mg by mouth daily.  Historical Provider, MD  busPIRone (BUSPAR) 15 MG tablet Take 2 tablets (30 mg total) by mouth 2 (two) times daily. 08/15/13   Dixon Boos, MD  escitalopram (LEXAPRO) 20 MG tablet Take 20 mg by mouth daily.     Historical Provider, MD  lisinopril (PRINIVIL,ZESTRIL) 10 MG tablet Take 10 mg by mouth daily.     Historical Provider, MD  LORazepam (ATIVAN) 2 MG tablet Take 2 mg by mouth 3 (three) times daily as needed for anxiety.  11/02/15   Historical Provider, MD  nystatin cream (MYCOSTATIN) Apply 1 application topically 2  (two) times daily as needed for dry skin.  03/03/15   Historical Provider, MD  oxyCODONE-acetaminophen (ROXICET) 5-325 MG tablet Take 1 tablet by mouth every 6 (six) hours as needed. 03/29/16   Schuyler Amor, MD  warfarin (COUMADIN) 10 MG tablet Take 1 tablet (10 mg total) by mouth every morning. Resume your coumadin 10 mg tablet starting 11/17/2017. You will get one dose today 11/17/2015 before discharge Patient taking differently: Take 10 mg by mouth daily.  11/18/15   Theodis Blaze, MD    Allergies Hydrocodone; Nitroglycerin; Tramadol; Hydromorphone; Levaquin [levofloxacin in d5w]; Toradol [ketorolac tromethamine]; Levofloxacin; Sulfa antibiotics; Tape; and Trazodone and nefazodone  Family History  Problem Relation Age of Onset  . Stroke Other   . Cancer Other   . Diabetes Other     Social History Social History  Substance Use Topics  . Smoking status: Former Research scientist (life sciences)  . Smokeless tobacco: Never Used  . Alcohol use No    Review of Systems Constitutional: No fever/chills Eyes: No visual changes. ENT: No sore throat. Cardiovascular: Denies chest pain. Respiratory: Denies shortness of breath. Gastrointestinal: No abdominal pain.  No nausea, no vomiting.  No diarrhea.  No constipation. Genitourinary: Negative for dysuria. Musculoskeletal: Negative for back pain. Skin: Negative for rash. Neurological: Negative for headaches, focal weakness or numbness.  10-point ROS otherwise negative.  ____________________________________________   PHYSICAL EXAM:  VITAL SIGNS: ED Triage Vitals  Enc Vitals Group     BP 06/05/16 1652 (!) 142/65     Pulse Rate 06/05/16 1652 64     Resp 06/05/16 1652 18     Temp 06/05/16 1652 98.1 F (36.7 C)     Temp Source 06/05/16 1652 Oral     SpO2 06/05/16 1652 99 %     Weight 06/05/16 1653 (!) 408 lb (185.1 kg)     Height 06/05/16 1653 5\' 6"  (1.676 m)     Head Circumference --      Peak Flow --      Pain Score --      Pain Loc --      Pain Edu?  --      Excl. in [place]? --     Constitutional: Alert and oriented. Well appearing and in no acute distress. Eyes: Conjunctivae are normal. PERRL. EOMI. Head: Atraumatic. Nose: No congestion/rhinnorhea. Mouth/Throat: Mucous membranes are moist.  Nonerythematous pharynx. No appreciable tongue swelling. Controlling secretions. Speaking in a normal voice. No uvular or tonsillar swelling. Neck: No stridor.   Cardiovascular: Normal rate, regular rhythm. Grossly normal heart sounds.  Respiratory: Normal respiratory effort.  No retractions. Lungs CTAB. Gastrointestinal: Soft and nontender. No distention.  Musculoskeletal: No lower extremity tenderness nor edema.  No joint effusions. Neurologic:  Normal speech and language. No gross focal neurologic deficits are appreciated.  Skin:  Skin is warm, dry and intact. No rash noted. Psychiatric: Mood and affect are normal. Speech  and behavior are normal.  ____________________________________________   LABS (all labs ordered are listed, but only abnormal results are displayed)  Labs Reviewed  PROTIME-INR - Abnormal; Notable for the following:       Result Value   Prothrombin Time 25.0 (*)    All other components within normal limits  CBC WITH DIFFERENTIAL/PLATELET - Abnormal; Notable for the following:    Hemoglobin 11.8 (*)    HCT 34.3 (*)    All other components within normal limits  BASIC METABOLIC PANEL - Abnormal; Notable for the following:    Glucose, Bld 122 (*)    All other components within normal limits  TROPONIN I   ____________________________________________  EKG  ED ECG REPORT I, Doran Stabler, the attending physician, personally viewed and interpreted this ECG.   Date: 06/05/2016  EKG Time: 1744  Rate: 59  Rhythm: normal sinus rhythm with PVC times one  Axis: Normal  Intervals:none  ST&T Change: No ST segment elevation or depression. No abnormal T-wave  inversion.  ____________________________________________  RADIOLOGY   ____________________________________________   PROCEDURES  Procedure(s) performed:   Procedures  Critical Care performed:   ____________________________________________   INITIAL IMPRESSION / ASSESSMENT AND PLAN / ED COURSE  Pertinent labs & imaging results that were available during my care of the patient were reviewed by me and considered in my medical decision making (see chart for details).  Atypical presentation of allergic reaction. We'll check EKG to make sure there is no evidence of an anginal equivalent.  Clinical Course     ----------------------------------------- 7:26 PM on 06/05/2016 -----------------------------------------  Patient is feeling improved at this time. No respiratory distress. Still with a normal voice and controlling her secretions. No stridor. Unclear what she could've reacted to. We'll discharge home with several days of steroids. We'll also discharge home with EpiPen. Give the patient EpiPen instructions including to call hospital immediately if she ever needs to use this in the case of a severe reaction. ____________________________________________   FINAL CLINICAL IMPRESSION(S) / ED DIAGNOSES  Allergic reaction.    NEW MEDICATIONS STARTED DURING THIS VISIT:  New Prescriptions   No medications on file     Note:  This document was prepared using Dragon voice recognition software and may include unintentional dictation errors.    Orbie Pyo, MD 06/05/16 [phone]

## 2016-06-24 ENCOUNTER — Emergency Department
Admission: EM | Admit: 2016-06-24 | Discharge: 2016-06-24 | Disposition: A | Discharge status: Home / Self Care | Payer: Medicaid Other | Attending: Emergency Medicine | Admitting: Emergency Medicine

## 2016-06-24 ENCOUNTER — Emergency Department: Payer: Medicaid Other

## 2016-06-24 ENCOUNTER — Encounter: Payer: Self-pay | Admitting: Medical Oncology

## 2016-06-24 DIAGNOSIS — Z79899 Other long term (current) drug therapy: Secondary | ICD-10-CM | POA: Diagnosis not present

## 2016-06-24 DIAGNOSIS — R0789 Other chest pain: Secondary | ICD-10-CM | POA: Diagnosis not present

## 2016-06-24 DIAGNOSIS — Z791 Long term (current) use of non-steroidal anti-inflammatories (NSAID): Secondary | ICD-10-CM | POA: Diagnosis not present

## 2016-06-24 DIAGNOSIS — R0602 Shortness of breath: Secondary | ICD-10-CM | POA: Insufficient documentation

## 2016-06-24 DIAGNOSIS — I10 Essential (primary) hypertension: Secondary | ICD-10-CM | POA: Insufficient documentation

## 2016-06-24 DIAGNOSIS — R05 Cough: Secondary | ICD-10-CM | POA: Diagnosis not present

## 2016-06-24 DIAGNOSIS — J45909 Unspecified asthma, uncomplicated: Secondary | ICD-10-CM | POA: Diagnosis not present

## 2016-06-24 DIAGNOSIS — Z87891 Personal history of nicotine dependence: Secondary | ICD-10-CM | POA: Insufficient documentation

## 2016-06-24 DIAGNOSIS — Z7901 Long term (current) use of anticoagulants: Secondary | ICD-10-CM | POA: Diagnosis not present

## 2016-06-24 DIAGNOSIS — E119 Type 2 diabetes mellitus without complications: Secondary | ICD-10-CM | POA: Insufficient documentation

## 2016-06-24 LAB — BASIC METABOLIC PANEL
Anion gap: 7 (ref 5–15)
BUN: 18 mg/dL (ref 6–20)
CALCIUM: 9.5 mg/dL (ref 8.9–10.3)
CO2: 25 mmol/L (ref 22–32)
Chloride: 104 mmol/L (ref 101–111)
Creatinine, Ser: 1.07 mg/dL — ABNORMAL HIGH (ref 0.44–1.00)
GFR calc Af Amer: >60 mL/min (ref >60)
GFR, EST NON AFRICAN AMERICAN: 59 mL/min — AB (ref >60)
GLUCOSE: 126 mg/dL — AB (ref 65–99)
POTASSIUM: 3.8 mmol/L (ref 3.5–5.1)
SODIUM: 136 mmol/L (ref 135–145)

## 2016-06-24 LAB — PROTIME-INR
INR: 1.99
PROTHROMBIN TIME: 22.9 s — AB (ref 11.4–15.2)

## 2016-06-24 LAB — TROPONIN I

## 2016-06-24 LAB — CBC
HEMATOCRIT: 38.5 % (ref 35.0–47.0)
Hemoglobin: 13.1 g/dL (ref 12.0–16.0)
MCH: 29 pg (ref 26.0–34.0)
MCHC: 34.1 g/dL (ref 32.0–36.0)
MCV: 85.1 fL (ref 80.0–100.0)
PLATELETS: 232 10*3/uL (ref 150–440)
RBC: 4.53 MIL/uL (ref 3.80–5.20)
RDW: 13.5 % (ref 11.5–14.5)
WBC: 9.1 10*3/uL (ref 3.6–11.0)

## 2016-06-24 LAB — FIBRIN DERIVATIVES D-DIMER (ARMC ONLY): Fibrin derivatives D-dimer (ARMC): 228 (ref 0–499)

## 2016-06-24 MED ORDER — IPRATROPIUM-ALBUTEROL 0.5-2.5 (3) MG/3ML IN SOLN
3.0000 mL | Freq: Once | RESPIRATORY_TRACT | Status: AC
  Administered 2016-06-24: 3 mL via RESPIRATORY_TRACT
  Filled 2016-06-24: qty 3

## 2016-06-24 MED ORDER — ALBUTEROL SULFATE HFA 108 (90 BASE) MCG/ACT IN AERS: 2.0000 | INHALATION_SPRAY | Freq: Four times a day (QID) | RESPIRATORY_TRACT | 0 refills | Status: DC | PRN

## 2016-06-24 NOTE — Discharge Instructions (Signed)
Please seek medical attention for any high fevers, chest pain, shortness of breath, change in behavior, persistent vomiting, bloody stool or any other new or concerning symptoms.  

## 2016-06-24 NOTE — ED Triage Notes (Signed)
Pt reports worsening sob x 5-6 days and cough that began 2 days ago. Pt also reports pressure in her chest.

## 2016-06-24 NOTE — ED Provider Notes (Signed)
[facility] Emergency Department Provider Note   ____________________________________________   I have reviewed the triage vital signs and the nursing notes.   HISTORY  Chief Complaint Shortness of Breath; Chest Pain; and Cough   History limited by: Not Limited   HPI Stacey [name] is a 55 y.o. female with history of DVT/PE on warfarin who presents to the emergency department today because of concerns for shortness of breath and cough. Patient states that she has been feeling short of breath for the past 5 days. She has felt short of breath even lying down.A little bit of discomfort in the central chest. In addition the patient states that starting yesterday she has developed a cough. It is not productive. She denies any fevers. Denies any known sick contacts.    Past Medical History:  Diagnosis Date  . Arthritis   . Asthma   . 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 C. difficile colitis 11/08/2011  . Cyst    pt has "cyst" to legs for years, areas drain at time  . Depression   . Diverticulitis   . DVT (deep venous thrombosis) ([place])   . Hernia, inguinal   . Hypertension   . Musculoskeletal pain 11/08/2011  . Neuropathic pain 11/10/2011  . Pancreatitis   . Pulmonary embolism ([place])   . Sleep apnea with use of continuous positive airway pressure (CPAP)    uses CPAP at night  . Type 2 diabetes mellitus [facility])     Patient Active Problem List   Diagnosis Date Noted  . TIA (transient ischemic attack) 11/15/2015  . Facial droop 11/14/2015  . Chest pain 08/14/2013  . Atypical chest pain 08/14/2013  . Localized swelling, mass, or lump of right lower extremity 08/14/2013  . Pulmonary embolism 07/16/2013  . Short of breath on exertion 07/16/2013  . Cellulitis of leg, right 06/03/2013  . Anemia 02/18/2013  . Diabetes mellitus type II, controlled ([place]) 02/17/2013  . Benign lipomatous neoplasm of skin, subcu of right leg  02/17/2013  . Leukocytosis 02/07/2013  . LLQ abdominal pain 10/28/2012  . UTI (lower urinary tract infection) 10/28/2012  . Dyspnea 10/28/2012  . OSA (obstructive sleep apnea) 10/28/2012  . RLQ abdominal pain 06/20/2012  . Neuropathic pain 11/10/2011  . C. difficile colitis 11/08/2011  . Musculoskeletal pain 11/08/2011  . Bradycardia 11/08/2011  . PVC's (premature ventricular contractions) 11/07/2011  . Chest pain, atypical 11/07/2011  . Gastroenteritis 11/06/2011  . Heart palpitations 11/06/2011  . Morbid obesity ([place]) 11/06/2011  . Anxiety and depression 11/06/2011  . HTN (hypertension) 11/06/2011  . Depression 11/06/2011    Past Surgical History:  Procedure Laterality Date  . CESAREAN SECTION    . CHOLECYSTECTOMY    . NOSE SURGERY    . SKIN BIOPSY    . TONSILLECTOMY      Prior to Admission medications   Medication Sig Start Date End Date Taking? Authorizing Provider  albuterol (PROVENTIL HFA;VENTOLIN HFA) 108 (90 BASE) MCG/ACT inhaler Inhale 2 puffs into the lungs every 4 (four) hours as needed for wheezing or shortness of breath. 07/19/13   Michiel Cowboy, MD  atorvastatin (LIPITOR) 40 MG tablet Take 40 mg by mouth daily.    Historical Provider, MD  atorvastatin (LIPITOR) 80 MG tablet Take 1 tablet (80 mg total) by mouth daily. Reported on 11/14/2015 Patient not taking: Reported on 02/07/2016 11/17/15   Theodis Blaze, MD  buPROPion (WELLBUTRIN XL) 300 MG 24 hr tablet Take 300  mg by mouth daily.     Historical Provider, MD  busPIRone (BUSPAR) 15 MG tablet Take 2 tablets (30 mg total) by mouth 2 (two) times daily. 08/15/13   Dixon Boos, MD  escitalopram (LEXAPRO) 20 MG tablet Take 20 mg by mouth daily.     Historical Provider, MD  lisinopril (PRINIVIL,ZESTRIL) 10 MG tablet Take 10 mg by mouth daily.     Historical Provider, MD  LORazepam (ATIVAN) 2 MG tablet Take 2 mg by mouth 3 (three) times daily as needed for anxiety.  11/02/15   Historical Provider, MD  nystatin  cream (MYCOSTATIN) Apply 1 application topically 2 (two) times daily as needed for dry skin.  03/03/15   Historical Provider, MD  oxyCODONE-acetaminophen (ROXICET) 5-325 MG tablet Take 1 tablet by mouth every 6 (six) hours as needed. 03/29/16   Schuyler Amor, MD  predniSONE (DELTASONE) 20 MG tablet Take 2 tablets (40 mg total) by mouth daily. 06/05/16 06/05/17  Orbie Pyo, MD  warfarin (COUMADIN) 10 MG tablet Take 1 tablet (10 mg total) by mouth every morning. Resume your coumadin 10 mg tablet starting 11/17/2017. You will get one dose today 11/17/2015 before discharge Patient taking differently: Take 10 mg by mouth daily.  11/18/15   Theodis Blaze, MD    Allergies Hydrocodone; Nitroglycerin; Tramadol; Hydromorphone; Levaquin [levofloxacin in d5w]; Toradol [ketorolac tromethamine]; Levofloxacin; Sulfa antibiotics; Tape; and Trazodone and nefazodone  Family History  Problem Relation Age of Onset  . Stroke Other   . Cancer Other   . Diabetes Other     Social History Social History  Substance Use Topics  . Smoking status: Former Research scientist (life sciences)  . Smokeless tobacco: Never Used  . Alcohol use No    Review of Systems  Constitutional: Negative for fever. Cardiovascular: Positive for chest discomfort. Respiratory: Positive for shortness of breath. Gastrointestinal: Negative for abdominal pain, vomiting and diarrhea. Neurological: Negative for headaches, focal weakness or numbness.  10-point ROS otherwise negative.  ____________________________________________   PHYSICAL EXAM:  VITAL SIGNS: ED Triage Vitals  Enc Vitals Group     BP 06/24/16 1755 (!) 149/50     Pulse Rate 06/24/16 1754 76     Resp 06/24/16 1754 20     Temp 06/24/16 1754 98.9 F (37.2 C)     Temp Source 06/24/16 1754 Oral     SpO2 06/24/16 1754 97 %     Weight 06/24/16 1754 (!) 408 lb (185.1 kg)     Height 06/24/16 1754 5\' 6"  (1.676 m)   Constitutional: Alert and oriented. Well appearing and in no distress.  Morbidly obese.  Eyes: Conjunctivae are normal. Normal extraocular movements. ENT   Head: Normocephalic and atraumatic.   Nose: No congestion/rhinnorhea.   Mouth/Throat: Mucous membranes are moist.   Neck: No stridor. Hematological/Lymphatic/Immunilogical: No cervical lymphadenopathy. Cardiovascular: Normal rate, regula Respiratory: Normal respiratory effort without tachypnea nor retractions. Breath sounds are clear and equal bilaterally. No wheezes/rales/rhonchi. Gastrointestinal: Soft and nontender. No distention.  Genitourinary: Deferred Musculoskeletal: Normal range of motion in all extremities. No lower extremity edema. Neurologic:  Normal speech and language. No gross focal neurologic deficits are appreciated.  Skin:  Skin is warm, dry and intact. No rash noted. Psychiatric: Mood and affect are normal. Speech and behavior are normal. Patient exhibits appropriate insight and judgment.  ____________________________________________    LABS (pertinent positives/negatives)  Labs Reviewed  BASIC METABOLIC PANEL - Abnormal; Notable for the following:       Result Value   Glucose,  Bld 126 (*)    Creatinine, Ser 1.07 (*)    GFR calc non Af Amer 59 (*)    All other components within normal limits  CBC  TROPONIN I     ____________________________________________   EKG  I, Nance Pear, attending physician, personally viewed and interpreted this EKG  EKG Time: 1758 Rate: 73 Rhythm: normal sinus rhythm with 1st degree av block Axis: left axis deviation Intervals: qtc 398, 1st degree av block QRS: narrow ST changes: no st elevation Impression: abnormal ekg   ____________________________________________    RADIOLOGY  CXR IMPRESSION: No active cardiopulmonary disease.  ____________________________________________   PROCEDURES  Procedures  ____________________________________________   INITIAL IMPRESSION / ASSESSMENT AND PLAN / ED  COURSE  Pertinent labs & imaging results that were available during my care of the patient were reviewed by me and considered in my medical decision making (see chart for details).  Patient presented to the emergency department today because of concerns for shortness of breath. Chest x-ray here negative for pneumonia. Additionally patient afebrile. Patient d-dimer less than 500. I did have discussion with the patient. Did discuss with her that no test is 100%. She does have a history of PE although lack of hypoxia or tachycardia and negative d-dimer are all reassuring findings. At this point did offer to continue workup and obtain CT scan however in view of the risk of CT scanning patient felt comfortable deferring at this time. We did discuss PE return precautions. She does have a pulse oximeter at home. In addition patient stated she felt better after breathing treatment. Will give patient prescription for albuterol inhaler. Lastly we discussed the patient's INR level. Did advise the patient take an extra 5 mg tonight and follow-up with primary care. While patient follow-up with primary care. ____________________________________________   FINAL CLINICAL IMPRESSION(S) / ED DIAGNOSES  Final diagnoses:  Shortness of breath     Note: This dictation was prepared with Dragon dictation. Any transcriptional errors that result from this process are unintentional    Nance Pear, MD 06/24/16 2117

## 2016-06-25 ENCOUNTER — Encounter: Payer: Self-pay | Admitting: Emergency Medicine

## 2016-06-25 DIAGNOSIS — Z7984 Long term (current) use of oral hypoglycemic drugs: Secondary | ICD-10-CM | POA: Diagnosis not present

## 2016-06-25 DIAGNOSIS — M7981 Nontraumatic hematoma of soft tissue: Secondary | ICD-10-CM | POA: Diagnosis not present

## 2016-06-25 DIAGNOSIS — E119 Type 2 diabetes mellitus without complications: Secondary | ICD-10-CM | POA: Diagnosis not present

## 2016-06-25 DIAGNOSIS — Z79899 Other long term (current) drug therapy: Secondary | ICD-10-CM | POA: Insufficient documentation

## 2016-06-25 DIAGNOSIS — J45909 Unspecified asthma, uncomplicated: Secondary | ICD-10-CM | POA: Insufficient documentation

## 2016-06-25 DIAGNOSIS — Z87891 Personal history of nicotine dependence: Secondary | ICD-10-CM | POA: Diagnosis not present

## 2016-06-25 DIAGNOSIS — M79651 Pain in right thigh: Secondary | ICD-10-CM | POA: Diagnosis present

## 2016-06-25 DIAGNOSIS — I10 Essential (primary) hypertension: Secondary | ICD-10-CM | POA: Diagnosis not present

## 2016-06-25 NOTE — ED Triage Notes (Signed)
Pt presents to ED with c/o painful knot to her right thigh. Pt states she was seen in this ED for the same yesterday and had labs drawn. Pt states she was discharged home with inhaler. Pt asking for ultrasound to make sure she doesn't have a PE. Pt called pharmacist and pcp and was told by both to come to ED for further evaluation.

## 2016-06-26 ENCOUNTER — Emergency Department: Payer: Medicaid Other

## 2016-06-26 ENCOUNTER — Emergency Department
Admission: EM | Admit: 2016-06-26 | Discharge: 2016-06-26 | Disposition: A | Discharge status: Home / Self Care | Payer: Medicaid Other | Attending: Emergency Medicine | Admitting: Emergency Medicine

## 2016-06-26 DIAGNOSIS — T148XXA Other injury of unspecified body region, initial encounter: Secondary | ICD-10-CM

## 2016-06-26 MED ORDER — ACETAMINOPHEN 500 MG PO TABS
1000.0000 mg | ORAL_TABLET | Freq: Once | ORAL | Status: AC
  Administered 2016-06-26: 1000 mg via ORAL
  Filled 2016-06-26: qty 2

## 2016-06-26 NOTE — Discharge Instructions (Signed)
1. Apply ice to affected area several times daily to reduce swelling. 2. Return to the ER for worsening symptoms, persistent vomiting, difficult breathing or other concerns.

## 2016-06-26 NOTE — ED Provider Notes (Signed)
[facility] Emergency Department Provider Note   ____________________________________________   First MD Initiated Contact with Patient 06/26/16 [phone]     (approximate)  I have reviewed the triage vital signs and the nursing notes.   HISTORY  Chief Complaint Leg Pain    HPI Stacey [name] is a 55 y.o. female who presents to the ED from home with a chief complaint of painful knot in her right thigh. Patient has a history of morbid obesity, history of DVT and PE for which she takes warfarin. She was seen in the ED yesterday for chest pain and shortness of breath. She was found to have a negative d-dimer and declined offer for CT and ultrasound. Patient states symptoms of chest pain and shortness of breath are improved today, but she noted a painful knot to her right thigh and was concerned that she has another DVT. Denies associated fever, chills, abdominal pain, nausea, vomiting, diarrhea. Denies recent travel or trauma. Nothing makes her symptoms better. Palpation makes her symptoms worse.   Past Medical History:  Diagnosis Date  . Arthritis   . Asthma   . 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 C. difficile colitis 11/08/2011  . Cyst    pt has "cyst" to legs for years, areas drain at time  . Depression   . Diverticulitis   . DVT (deep venous thrombosis) ([place])   . Hernia, inguinal   . Hypertension   . Musculoskeletal pain 11/08/2011  . Neuropathic pain 11/10/2011  . Pancreatitis   . Pulmonary embolism ([place])   . Sleep apnea with use of continuous positive airway pressure (CPAP)    uses CPAP at night  . Type 2 diabetes mellitus [facility])     Patient Active Problem List   Diagnosis Date Noted  . TIA (transient ischemic attack) 11/15/2015  . Facial droop 11/14/2015  . Chest pain 08/14/2013  . Atypical chest pain 08/14/2013  . Localized swelling, mass, or lump of right lower extremity 08/14/2013  . Pulmonary embolism  07/16/2013  . Short of breath on exertion 07/16/2013  . Cellulitis of leg, right 06/03/2013  . Anemia 02/18/2013  . Diabetes mellitus type II, controlled ([place]) 02/17/2013  . 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 Leukocytosis 02/07/2013  . LLQ abdominal pain 10/28/2012  . UTI (lower urinary tract infection) 10/28/2012  . Dyspnea 10/28/2012  . OSA (obstructive sleep apnea) 10/28/2012  . RLQ abdominal pain 06/20/2012  . Neuropathic pain 11/10/2011  . C. difficile colitis 11/08/2011  . Musculoskeletal pain 11/08/2011  . Bradycardia 11/08/2011  . PVC's (premature ventricular contractions) 11/07/2011  . Chest pain, atypical 11/07/2011  . Gastroenteritis 11/06/2011  . Heart palpitations 11/06/2011  . Morbid obesity ([place]) 11/06/2011  . Anxiety and depression 11/06/2011  . HTN (hypertension) 11/06/2011  . Depression 11/06/2011    Past Surgical History:  Procedure Laterality Date  . CESAREAN SECTION    . CHOLECYSTECTOMY    . NOSE SURGERY    . SKIN BIOPSY    . TONSILLECTOMY      Prior to Admission medications   Medication Sig Start Date End Date Taking? Authorizing Provider  albuterol (PROVENTIL HFA;VENTOLIN HFA) 108 (90 BASE) MCG/ACT inhaler Inhale 2 puffs into the lungs every 4 (four) hours as needed for wheezing or shortness of breath. 07/19/13   Michiel Cowboy, MD  albuterol (PROVENTIL HFA;VENTOLIN HFA) 108 (90 Base) MCG/ACT inhaler Inhale 2 puffs into the lungs every 6 (  six) hours as needed for wheezing or shortness of breath. 06/24/16   Nance Pear, MD  buPROPion (WELLBUTRIN XL) 300 MG 24 hr tablet Take 300 mg by mouth daily.     Historical Provider, MD  busPIRone (BUSPAR) 15 MG tablet Take 2 tablets (30 mg total) by mouth 2 (two) times daily. 08/15/13   Dixon Boos, MD  escitalopram (LEXAPRO) 20 MG tablet Take 20 mg by mouth daily.     Historical Provider, MD  lisinopril (PRINIVIL,ZESTRIL) 10 MG tablet Take 10 mg by mouth daily.      Historical Provider, MD  LORazepam (ATIVAN) 2 MG tablet Take 2 mg by mouth 3 (three) times daily as needed for anxiety.  11/02/15   Historical Provider, MD  nystatin cream (MYCOSTATIN) Apply 1 application topically 2 (two) times daily as needed for dry skin.  03/03/15   Historical Provider, MD  predniSONE (DELTASONE) 20 MG tablet Take 2 tablets (40 mg total) by mouth daily. 06/05/16 06/05/17  Orbie Pyo, MD  warfarin (COUMADIN) 10 MG tablet Take 1 tablet (10 mg total) by mouth every morning. Resume your coumadin 10 mg tablet starting 11/17/2017. You will get one dose today 11/17/2015 before discharge Patient taking differently: Take 10 mg by mouth daily. Alternates between 10mg  and 5mg  each day. 11/18/15   Theodis Blaze, MD    Allergies Hydrocodone; Nitroglycerin; Tramadol; Hydromorphone; Levaquin [levofloxacin in d5w]; Toradol [ketorolac tromethamine]; Levofloxacin; Sulfa antibiotics; Tape; and Trazodone and nefazodone  Family History  Problem Relation Age of Onset  . Stroke Other   . Cancer Other   . Diabetes Other     Social History Social History  Substance Use Topics  . Smoking status: Former Research scientist (life sciences)  . Smokeless tobacco: Never Used  . Alcohol use No    Review of Systems  Constitutional: No fever/chills. Eyes: No visual changes. ENT: No sore throat. Cardiovascular: Denies chest pain. Respiratory: Denies shortness of breath. Gastrointestinal: No abdominal pain.  No nausea, no vomiting.  No diarrhea.  No constipation. Genitourinary: Negative for dysuria. Musculoskeletal: Positive for painful right thigh knot. Negative for back pain. Skin: Negative for rash. Neurological: Negative for headaches, focal weakness or numbness.  10-point ROS otherwise negative.  ____________________________________________   PHYSICAL EXAM:  VITAL SIGNS: ED Triage Vitals  Enc Vitals Group     BP 06/25/16 2247 (!) 144/66     Pulse Rate 06/25/16 2247 72     Resp 06/25/16 2247 18      Temp 06/25/16 2247 98.2 F (36.8 C)     Temp Source 06/25/16 2247 Oral     SpO2 06/25/16 2247 99 %     Weight 06/25/16 2248 (!) 408 lb (185.1 kg)     Height 06/25/16 2248 5\' 6"  (1.676 m)     Head Circumference --      Peak Flow --      Pain Score 06/25/16 2248 6     Pain Loc --      Pain Edu? --      Excl. in [place]? --     Constitutional: Alert and oriented. Well appearing and in no acute distress. Eyes: Conjunctivae are normal. PERRL. EOMI. Head: Atraumatic. Nose: No congestion/rhinnorhea. Mouth/Throat: Mucous membranes are moist.  Oropharynx non-erythematous. Neck: No stridor.   Cardiovascular: Normal rate, regular rhythm. Grossly normal heart sounds.  Good peripheral circulation. Respiratory: Normal respiratory effort.  No retractions. Lungs CTAB. Gastrointestinal: Morbidly obese. Soft and nontender. No distention. No abdominal bruits. No CVA tenderness. Musculoskeletal:  Contusion to right lateral thigh which appears old with central palpable hematoma. No fluctuance. No warmth/erythema. Symmetrically sized calves which are supple and nontender. Symmetrically warm limbs without evidence for ischemia. 2+ femoral and distal pulses. Brisk, less than 5 second capillary refill. Neurologic:  Normal speech and language. No gross focal neurologic deficits are appreciated. No gait instability. Skin:  Skin is warm, dry and intact. No rash noted. Psychiatric: Mood and affect are normal. Speech and behavior are normal.  ____________________________________________   LABS (all labs ordered are listed, but only abnormal results are displayed)  Labs Reviewed - No data to display _ ___________________________________________  RLE Doppler ultrasound interpreted per Dr. Gerilyn Nestle:  No evidence of deep venous thrombosis. Complex fluid collection  demonstrated in the right lateral thigh corresponding to the area of  palpable change.    ____________________________________________   PROCEDURES  Procedure(s) performed: None  Procedures  Critical Care performed: No  ____________________________________________   INITIAL IMPRESSION / ASSESSMENT AND PLAN / ED COURSE  Pertinent labs & imaging results that were available during my care of the patient were reviewed by me and considered in my medical decision making (see chart for details).  55 year old female on warfarin for prior history of RLE DVT and PE presenting with "knot" on right thigh and concern for recurrent DVT. Clinically patient appears to have contusion with palpable hematoma to her upper right thigh. However, given her comorbidities and concern for recurrent DVT, will proceed with RLE Doppler ultrasound.  Clinical Course as of Jun 26 445  Nancy Fetter Jun 26, 2016  0353 Updated patient of ultrasound results. Strict return precautions given. Patient verbalizes understanding and agrees with plan of care.  [JS]    Clinical Course User Index [JS] Paulette Blanch, MD     ____________________________________________   FINAL CLINICAL IMPRESSION(S) / ED DIAGNOSES  Final diagnoses:  Hematoma      NEW MEDICATIONS STARTED DURING THIS VISIT:  New Prescriptions   No medications on file     Note:  This document was prepared using Dragon voice recognition software and may include unintentional dictation errors.    Paulette Blanch, MD 06/26/16 [phone]

## 2016-06-28 ENCOUNTER — Ambulatory Visit
Admission: RE | Admit: 2016-06-28 | Discharge: 2016-06-28 | Disposition: A | Discharge status: Home / Self Care | Payer: Medicaid Other | Source: Ambulatory Visit | Attending: Adult Health | Admitting: Adult Health

## 2016-06-28 ENCOUNTER — Ambulatory Visit: Admission: RE | Admit: 2016-06-28 | Payer: Self-pay | Source: Ambulatory Visit | Admitting: Adult Health

## 2016-06-28 ENCOUNTER — Encounter
Admission: RE | Admit: 2016-06-28 | Discharge: 2016-06-28 | Disposition: A | Discharge status: Home / Self Care | Payer: Medicaid Other | Source: Ambulatory Visit | Attending: Adult Health | Admitting: Adult Health

## 2016-06-28 ENCOUNTER — Other Ambulatory Visit: Payer: Self-pay | Admitting: Adult Health

## 2016-06-28 ENCOUNTER — Ambulatory Visit: Admission: RE | Admit: 2016-06-28 | Payer: Medicaid Other | Source: Ambulatory Visit | Admitting: Adult Health

## 2016-06-28 DIAGNOSIS — Z86711 Personal history of pulmonary embolism: Secondary | ICD-10-CM | POA: Insufficient documentation

## 2016-06-28 MED ORDER — TECHNETIUM TC 99M DIETHYLENETRIAME-PENTAACETIC ACID
43.4960 | Freq: Once | INTRAVENOUS | Status: AC | PRN
  Administered 2016-06-28: 43.496 via INTRAVENOUS

## 2016-06-28 MED ORDER — TECHNETIUM TO 99M ALBUMIN AGGREGATED
3.8270 | Freq: Once | INTRAVENOUS | Status: AC | PRN
  Administered 2016-06-28: 3.827 via INTRAVENOUS

## 2016-07-30 ENCOUNTER — Other Ambulatory Visit
Admission: RE | Admit: 2016-07-30 | Discharge: 2016-07-30 | Disposition: A | Discharge status: Home / Self Care | Payer: Medicaid Other | Source: Ambulatory Visit | Attending: Adult Health | Admitting: Adult Health

## 2016-07-30 DIAGNOSIS — Z7901 Long term (current) use of anticoagulants: Secondary | ICD-10-CM | POA: Insufficient documentation

## 2016-07-30 LAB — PROTIME-INR
INR: 1.37
Prothrombin Time: 17 seconds — ABNORMAL HIGH (ref 11.4–15.2)

## 2016-08-11 ENCOUNTER — Other Ambulatory Visit
Admission: RE | Admit: 2016-08-11 | Discharge: 2016-08-11 | Disposition: A | Discharge status: Home / Self Care | Payer: Medicaid Other | Source: Ambulatory Visit | Attending: Adult Health | Admitting: Adult Health

## 2016-08-11 ENCOUNTER — Telehealth (INDEPENDENT_AMBULATORY_CARE_PROVIDER_SITE_OTHER): Payer: Self-pay | Admitting: Vascular Surgery

## 2016-08-11 DIAGNOSIS — Z7901 Long term (current) use of anticoagulants: Secondary | ICD-10-CM | POA: Diagnosis not present

## 2016-08-11 DIAGNOSIS — Z5181 Encounter for therapeutic drug level monitoring: Secondary | ICD-10-CM | POA: Diagnosis present

## 2016-08-11 LAB — PROTIME-INR
INR: 1.74
Prothrombin Time: 20.6 seconds — ABNORMAL HIGH (ref 11.4–15.2)

## 2016-08-11 NOTE — Telephone Encounter (Signed)
Pt called and stated she had her coumadin checked today and it was 1.7, pt want to know what kind of adjustments does she need to make. [phone]

## 2016-08-12 NOTE — Telephone Encounter (Signed)
Patient is currently going to a new facility, and they requested the patients records about a week ago because they will be managing her coumadin levels and medications moving forward. I tried calling her at the number listed on her chart, but it's not her current phone number

## 2016-09-01 ENCOUNTER — Other Ambulatory Visit
Admission: RE | Admit: 2016-09-01 | Discharge: 2016-09-01 | Disposition: A | Discharge status: Home / Self Care | Payer: Medicaid Other | Source: Ambulatory Visit | Attending: Adult Health | Admitting: Adult Health

## 2016-09-01 DIAGNOSIS — Z7901 Long term (current) use of anticoagulants: Secondary | ICD-10-CM | POA: Insufficient documentation

## 2016-09-01 LAB — PROTIME-INR
INR: 2.43
PROTHROMBIN TIME: 26.9 s — AB (ref 11.4–15.2)

## 2016-09-09 ENCOUNTER — Other Ambulatory Visit
Admission: RE | Admit: 2016-09-09 | Discharge: 2016-09-09 | Disposition: A | Discharge status: Home / Self Care | Payer: Medicaid Other | Source: Ambulatory Visit | Attending: Adult Health | Admitting: Adult Health

## 2016-09-09 DIAGNOSIS — Z7901 Long term (current) use of anticoagulants: Secondary | ICD-10-CM | POA: Insufficient documentation

## 2016-09-09 DIAGNOSIS — Z5181 Encounter for therapeutic drug level monitoring: Secondary | ICD-10-CM | POA: Diagnosis not present

## 2016-09-09 LAB — PROTIME-INR
INR: 2.41
Prothrombin Time: 26.7 seconds — ABNORMAL HIGH (ref 11.4–15.2)

## 2016-11-17 ENCOUNTER — Other Ambulatory Visit
Admission: RE | Admit: 2016-11-17 | Discharge: 2016-11-17 | Disposition: A | Discharge status: Home / Self Care | Payer: Medicaid Other | Source: Ambulatory Visit | Attending: Adult Health | Admitting: Adult Health

## 2016-11-17 DIAGNOSIS — Z7901 Long term (current) use of anticoagulants: Secondary | ICD-10-CM | POA: Insufficient documentation

## 2016-11-17 LAB — PROTIME-INR
INR: 2.3
Prothrombin Time: 25.7 seconds — ABNORMAL HIGH (ref 11.4–15.2)

## 2016-12-01 ENCOUNTER — Other Ambulatory Visit
Admission: RE | Admit: 2016-12-01 | Discharge: 2016-12-01 | Disposition: A | Discharge status: Home / Self Care | Payer: Medicaid Other | Source: Ambulatory Visit | Attending: Adult Health | Admitting: Adult Health

## 2016-12-01 DIAGNOSIS — Z7901 Long term (current) use of anticoagulants: Secondary | ICD-10-CM | POA: Insufficient documentation

## 2016-12-01 DIAGNOSIS — Z5181 Encounter for therapeutic drug level monitoring: Secondary | ICD-10-CM | POA: Insufficient documentation

## 2016-12-01 LAB — PROTIME-INR
INR: 2.81
PROTHROMBIN TIME: 30.2 s — AB (ref 11.4–15.2)

## 2016-12-13 ENCOUNTER — Other Ambulatory Visit
Admission: RE | Admit: 2016-12-13 | Discharge: 2016-12-13 | Disposition: A | Discharge status: Home / Self Care | Payer: Medicaid Other | Source: Ambulatory Visit | Attending: Adult Health | Admitting: Adult Health

## 2016-12-13 DIAGNOSIS — Z7901 Long term (current) use of anticoagulants: Secondary | ICD-10-CM | POA: Diagnosis present

## 2016-12-13 LAB — PROTIME-INR
INR: 1.64
Prothrombin Time: 19.6 seconds — ABNORMAL HIGH (ref 11.4–15.2)

## 2017-01-02 ENCOUNTER — Other Ambulatory Visit
Admission: RE | Admit: 2017-01-02 | Discharge: 2017-01-02 | Disposition: A | Discharge status: Home / Self Care | Payer: Medicaid Other | Source: Ambulatory Visit | Attending: Adult Health | Admitting: Adult Health

## 2017-01-02 DIAGNOSIS — Z7901 Long term (current) use of anticoagulants: Secondary | ICD-10-CM | POA: Insufficient documentation

## 2017-01-02 LAB — PROTIME-INR
INR: 2.41
Prothrombin Time: 26.7 seconds — ABNORMAL HIGH (ref 11.4–15.2)
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
## 2017-02-07 ENCOUNTER — Other Ambulatory Visit
Admission: RE | Admit: 2017-02-07 | Discharge: 2017-02-07 | Disposition: A | Discharge status: Home / Self Care | Payer: Medicaid Other | Source: Ambulatory Visit | Attending: Adult Health | Admitting: Adult Health

## 2017-02-07 DIAGNOSIS — Z7901 Long term (current) use of anticoagulants: Secondary | ICD-10-CM | POA: Diagnosis not present

## 2017-02-07 DIAGNOSIS — Z5181 Encounter for therapeutic drug level monitoring: Secondary | ICD-10-CM | POA: Diagnosis present

## 2017-02-07 LAB — PROTIME-INR
INR: 1.85
Prothrombin Time: 21.6 seconds — ABNORMAL HIGH (ref 11.4–15.2)

## 2017-02-09 ENCOUNTER — Other Ambulatory Visit (INDEPENDENT_AMBULATORY_CARE_PROVIDER_SITE_OTHER): Payer: Self-pay | Admitting: Vascular Surgery

## 2017-02-16 ENCOUNTER — Ambulatory Visit: Payer: Medicaid Other | Attending: Neurology

## 2017-02-16 DIAGNOSIS — G4730 Sleep apnea, unspecified: Secondary | ICD-10-CM | POA: Diagnosis present

## 2017-02-16 DIAGNOSIS — G4733 Obstructive sleep apnea (adult) (pediatric): Secondary | ICD-10-CM | POA: Insufficient documentation

## 2017-03-03 ENCOUNTER — Other Ambulatory Visit
Admission: RE | Admit: 2017-03-03 | Discharge: 2017-03-03 | Disposition: A | Discharge status: Home / Self Care | Payer: Medicaid Other | Source: Ambulatory Visit | Attending: Adult Health | Admitting: Adult Health

## 2017-03-03 DIAGNOSIS — Z7901 Long term (current) use of anticoagulants: Secondary | ICD-10-CM | POA: Diagnosis not present

## 2017-03-03 LAB — PROTIME-INR
INR: 2.91
Prothrombin Time: 31 seconds — ABNORMAL HIGH (ref 11.4–15.2)

## 2017-03-16 ENCOUNTER — Emergency Department: Payer: Medicaid Other

## 2017-03-16 ENCOUNTER — Emergency Department
Admission: EM | Admit: 2017-03-16 | Discharge: 2017-03-16 | Disposition: A | Discharge status: Home / Self Care | Payer: Medicaid Other | Attending: Emergency Medicine | Admitting: Emergency Medicine

## 2017-03-16 ENCOUNTER — Encounter: Payer: Self-pay | Admitting: Emergency Medicine

## 2017-03-16 DIAGNOSIS — M79604 Pain in right leg: Secondary | ICD-10-CM | POA: Diagnosis present

## 2017-03-16 DIAGNOSIS — Z87891 Personal history of nicotine dependence: Secondary | ICD-10-CM | POA: Insufficient documentation

## 2017-03-16 DIAGNOSIS — I10 Essential (primary) hypertension: Secondary | ICD-10-CM | POA: Diagnosis not present

## 2017-03-16 DIAGNOSIS — E119 Type 2 diabetes mellitus without complications: Secondary | ICD-10-CM | POA: Insufficient documentation

## 2017-03-16 DIAGNOSIS — J45909 Unspecified asthma, uncomplicated: Secondary | ICD-10-CM | POA: Diagnosis not present

## 2017-03-16 DIAGNOSIS — Z79899 Other long term (current) drug therapy: Secondary | ICD-10-CM | POA: Diagnosis not present

## 2017-03-16 DIAGNOSIS — Z7901 Long term (current) use of anticoagulants: Secondary | ICD-10-CM | POA: Insufficient documentation

## 2017-03-16 LAB — CBC
HEMATOCRIT: 38.2 % (ref 35.0–47.0)
HEMOGLOBIN: 12.7 g/dL (ref 12.0–16.0)
MCH: 28.1 pg (ref 26.0–34.0)
MCHC: 33.3 g/dL (ref 32.0–36.0)
MCV: 84.6 fL (ref 80.0–100.0)
Platelets: 257 10*3/uL (ref 150–440)
RBC: 4.51 MIL/uL (ref 3.80–5.20)
RDW: 13.9 % (ref 11.5–14.5)
WBC: 6.5 10*3/uL (ref 3.6–11.0)

## 2017-03-16 LAB — COMPREHENSIVE METABOLIC PANEL
ALK PHOS: 66 U/L (ref 38–126)
ALT: 15 U/L (ref 14–54)
AST: 22 U/L (ref 15–41)
Albumin: 3.7 g/dL (ref 3.5–5.0)
Anion gap: 6 (ref 5–15)
BILIRUBIN TOTAL: 0.5 mg/dL (ref 0.3–1.2)
BUN: 19 mg/dL (ref 6–20)
CHLORIDE: 108 mmol/L (ref 101–111)
CO2: 26 mmol/L (ref 22–32)
Calcium: 9.4 mg/dL (ref 8.9–10.3)
Creatinine, Ser: 0.83 mg/dL (ref 0.44–1.00)
GFR calc Af Amer: >60 mL/min (ref >60)
GFR calc non Af Amer: >60 mL/min (ref >60)
Glucose, Bld: 111 mg/dL — ABNORMAL HIGH (ref 65–99)
POTASSIUM: 4.1 mmol/L (ref 3.5–5.1)
Sodium: 140 mmol/L (ref 135–145)
TOTAL PROTEIN: 7.1 g/dL (ref 6.5–8.1)

## 2017-03-16 LAB — PROTIME-INR
INR: 2.53
Prothrombin Time: 27.7 seconds — ABNORMAL HIGH (ref 11.4–15.2)

## 2017-03-16 NOTE — ED Notes (Signed)
EDP at bedside  

## 2017-03-16 NOTE — ED Triage Notes (Signed)
Painful right leg for two days. Hx of blood clot. Also c/o shortness of breath.

## 2017-03-16 NOTE — ED Provider Notes (Signed)
[facility] Emergency Department Provider Note  Time seen: 8:01 AM  I have reviewed the triage vital signs and the nursing notes.   HISTORY  Chief Complaint Leg Pain    HPI Stacey [name] is a 56 y.o. female with a past medical history of depression, hypertension, obesity on CPAP at night, history of DVT on Coumadin who presents to the emergency department for right leg discomfort. According to the patient she states she always has pain in the right leg for the past 4 days it has been somewhat worse than normal. States she had a DVT in this leg several years ago and is currently taking Coumadin. Patient states she is also noted some mild shortness of breath over the past few days but denies any chest pain. Patient denies any fever, cough or congestion. Describes her right leg discomfort is mild, worse in the thigh.  Past Medical History:  Diagnosis Date  . Arthritis   . Asthma   . 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 C. difficile colitis 11/08/2011  . Cyst    pt has "cyst" to legs for years, areas drain at time  . Depression   . Diverticulitis   . DVT (deep venous thrombosis) ([place])   . Hernia, inguinal   . Hypertension   . Musculoskeletal pain 11/08/2011  . Neuropathic pain 11/10/2011  . Pancreatitis   . Pulmonary embolism ([place])   . Sleep apnea with use of continuous positive airway pressure (CPAP)    uses CPAP at night  . Type 2 diabetes mellitus [facility])     Patient Active Problem List   Diagnosis Date Noted  . TIA (transient ischemic attack) 11/15/2015  . Facial droop 11/14/2015  . Chest pain 08/14/2013  . Atypical chest pain 08/14/2013  . Localized swelling, mass, or lump of right lower extremity 08/14/2013  . Pulmonary embolism 07/16/2013  . Short of breath on exertion 07/16/2013  . Cellulitis of leg, right 06/03/2013  . Anemia 02/18/2013  . Diabetes mellitus type II, controlled ([place]) 02/17/2013  . Benign  lipomatous neoplasm of skin, subcu of right leg 02/17/2013  . Leukocytosis 02/07/2013  . LLQ abdominal pain 10/28/2012  . UTI (lower urinary tract infection) 10/28/2012  . Dyspnea 10/28/2012  . OSA (obstructive sleep apnea) 10/28/2012  . RLQ abdominal pain 06/20/2012  . Neuropathic pain 11/10/2011  . C. difficile colitis 11/08/2011  . Musculoskeletal pain 11/08/2011  . Bradycardia 11/08/2011  . PVC's (premature ventricular contractions) 11/07/2011  . Chest pain, atypical 11/07/2011  . Gastroenteritis 11/06/2011  . Heart palpitations 11/06/2011  . Morbid obesity ([place]) 11/06/2011  . Anxiety and depression 11/06/2011  . HTN (hypertension) 11/06/2011  . Depression 11/06/2011    Past Surgical History:  Procedure Laterality Date  . CESAREAN SECTION    . CHOLECYSTECTOMY    . NOSE SURGERY    . SKIN BIOPSY    . TONSILLECTOMY      Prior to Admission medications   Medication Sig Start Date End Date Taking? Authorizing Provider  albuterol (PROVENTIL HFA;VENTOLIN HFA) 108 (90 BASE) MCG/ACT inhaler Inhale 2 puffs into the lungs every 4 (four) hours as needed for wheezing or shortness of breath. 07/19/13   Michiel Cowboy, MD  albuterol (PROVENTIL HFA;VENTOLIN HFA) 108 (90 Base) MCG/ACT inhaler Inhale 2 puffs into the lungs every 6 (six) hours as needed for wheezing or shortness of breath. 06/24/16   Nance Pear, MD  buPROPion (WELLBUTRIN XL) 300 MG 24 hr tablet  Take 300 mg by mouth daily.     [provider]  busPIRone (BUSPAR) 15 MG tablet Take 2 tablets (30 mg total) by mouth 2 (two) times daily. 08/15/13   Dixon Boos, MD  escitalopram (LEXAPRO) 20 MG tablet Take 20 mg by mouth daily.     [provider]  lisinopril (PRINIVIL,ZESTRIL) 10 MG tablet Take 10 mg by mouth daily.     [provider]  LORazepam (ATIVAN) 2 MG tablet Take 2 mg by mouth 3 (three) times daily as needed for anxiety.  11/02/15   [provider]  nystatin cream  (MYCOSTATIN) Apply 1 application topically 2 (two) times daily as needed for dry skin.  03/03/15   [provider]  predniSONE (DELTASONE) 20 MG tablet Take 2 tablets (40 mg total) by mouth daily. 06/05/16 06/05/17  Schaevitz, Randall An, MD  warfarin (COUMADIN) 10 MG tablet TAKE 1 TABLET BY MOUTH ONCE DAILY 02/09/17   Algernon Huxley, MD    Allergies  Allergen Reactions  . Hydrocodone Anaphylaxis  . Nitroglycerin Anxiety  . Tramadol Anaphylaxis and Swelling  . Hydromorphone Other (See Comments)    "hallucinations" has tolerated morphine, but "it doesn't work very well"  . Levaquin [Levofloxacin In D5w] Nausea And Vomiting  . Toradol [Ketorolac Tromethamine] Swelling  . Levofloxacin Other (See Comments)  . Sulfa Antibiotics Rash  . Tape Rash  . Trazodone And Nefazodone Other (See Comments)    Family History  Problem Relation Age of Onset  . Stroke Other   . Cancer Other   . Diabetes Other     Social History Social History  Substance Use Topics  . Smoking status: Former Research scientist (life sciences)  . Smokeless tobacco: Never Used  . Alcohol use No    Review of Systems Constitutional: Negative for fever. Cardiovascular: Negative for chest pain. Respiratory: Negative for shortness of breath. Gastrointestinal: Negative for abdominal pain Musculoskeletal: Negative for back pain.Positive for right leg pain  Neurological: Negative for headache All other ROS negative  ____________________________________________   PHYSICAL EXAM:  VITAL SIGNS: ED Triage Vitals  Enc Vitals Group     BP 03/16/17 0733 (!) 152/88     Pulse Rate 03/16/17 0733 64     Resp 03/16/17 0733 20     Temp 03/16/17 0733 98.1 F (36.7 C)     Temp Source 03/16/17 0733 Oral     SpO2 03/16/17 0733 97 %     Weight 03/16/17 0723 (!) 408 lb (185.1 kg)     Height --      Head Circumference --      Peak Flow --      Pain Score 03/16/17 0723 7     Pain Loc --      Pain Edu? --      Excl. in [place]? --      Constitutional: Alert and oriented. Well appearing and in no distress. Eyes: Normal exam ENT   Head: Normocephalic and atraumatic.   Mouth/Throat: Mucous membranes are moist. Cardiovascular: Normal rate, regular rhythm. No murmu Respiratory: Normal respiratory effort without tachypnea nor retractions. Breath sounds are clear Gastrointestinal: Soft and nontender. Obese. Musculoskeletal: Nontender with normal range of motion in all extremities. Mild tenderness over the right proximal lateral thigh. 2+ DP pulse. Left lower extremity is nontender. Patient has no appreciable unilateral edema Neurologic:  Normal speech and language. No gross focal neurologic deficits  Skin:  Skin is warm, dry and intact.  Psychiatric: Mood and affect are normal.  ____________________________________________    RADIOLOGY  Ultrasound negative for DVT  ____________________________________________   INITIAL IMPRESSION / ASSESSMENT AND PLAN / ED COURSE  Pertinent labs & imaging results that were available during my care of the patient were reviewed by me and considered in my medical decision making (see chart for details).  Patient presents to the emergency department for right leg discomfort for the past 4 days. She states her right leg always hurts but has been somewhat worse/different over the past 4 days. History of DVT previously, currently on Coumadin. We will check labs including INR and obtain a right lower extremity ultrasound. Reassuringly the patient's vitals are normal with a normal heart rate, normal room air saturation with a normal respiratory rate. Patient is calm and cooperative, in no distress.  Patient's labs are largely within normal limits including a therapeutic INR. Ultrasound is negative for DVT. We will discharge the patient home with PCP follow-up. Patient agreeable plan.  ____________________________________________   FINAL CLINICAL IMPRESSION(S) / ED DIAGNOSES  Right  leg pain Musculoskeletal pain   Harvest Dark, MD 03/16/17 [phone]

## 2017-04-04 ENCOUNTER — Encounter: Payer: Self-pay | Admitting: Family Medicine

## 2017-04-05 ENCOUNTER — Ambulatory Visit: Payer: Self-pay | Admitting: Family Medicine

## 2017-04-05 ENCOUNTER — Encounter: Payer: Self-pay | Admitting: Family Medicine

## 2017-04-05 ENCOUNTER — Ambulatory Visit (INDEPENDENT_AMBULATORY_CARE_PROVIDER_SITE_OTHER): Payer: Medicaid Other | Admitting: Family Medicine

## 2017-04-05 VITALS — BP 116/84 | HR 78 | Temp 97.9°F | Resp 16 | Ht 66.0 in | Wt 388.0 lb

## 2017-04-05 DIAGNOSIS — I10 Essential (primary) hypertension: Secondary | ICD-10-CM | POA: Diagnosis not present

## 2017-04-05 DIAGNOSIS — H6983 Other specified disorders of Eustachian tube, bilateral: Secondary | ICD-10-CM | POA: Insufficient documentation

## 2017-04-05 DIAGNOSIS — I824Y9 Acute embolism and thrombosis of unspecified deep veins of unspecified proximal lower extremity: Secondary | ICD-10-CM

## 2017-04-05 DIAGNOSIS — G4733 Obstructive sleep apnea (adult) (pediatric): Secondary | ICD-10-CM

## 2017-04-05 DIAGNOSIS — E119 Type 2 diabetes mellitus without complications: Secondary | ICD-10-CM | POA: Diagnosis not present

## 2017-04-05 DIAGNOSIS — F329 Major depressive disorder, single episode, unspecified: Secondary | ICD-10-CM

## 2017-04-05 DIAGNOSIS — G458 Other transient cerebral ischemic attacks and related syndromes: Secondary | ICD-10-CM

## 2017-04-05 DIAGNOSIS — Z7689 Persons encountering health services in other specified circumstances: Secondary | ICD-10-CM | POA: Diagnosis not present

## 2017-04-05 DIAGNOSIS — I2699 Other pulmonary embolism without acute cor pulmonale: Secondary | ICD-10-CM | POA: Diagnosis not present

## 2017-04-05 DIAGNOSIS — F419 Anxiety disorder, unspecified: Secondary | ICD-10-CM

## 2017-04-05 LAB — POCT INR
INR: 3.2
PT: 38.1

## 2017-04-05 LAB — POCT GLYCOSYLATED HEMOGLOBIN (HGB A1C)
ESTIMATED AVERAGE GLUCOSE: 126
Hemoglobin A1C: 6

## 2017-04-05 MED ORDER — LORAZEPAM 2 MG PO TABS: 2.0000 mg | ORAL_TABLET | Freq: Three times a day (TID) | ORAL | 0 refills | Status: DC | PRN

## 2017-04-05 MED ORDER — WARFARIN SODIUM 10 MG PO TABS: ORAL_TABLET | ORAL | 3 refills | Status: DC

## 2017-04-05 MED ORDER — FLUTICASONE PROPIONATE 50 MCG/ACT NA SUSP: 2.0000 | Freq: Every day | NASAL | 6 refills | Status: DC

## 2017-04-05 NOTE — Assessment & Plan Note (Signed)
Long discussion regarding risk of long-term benzodiazepine therapy Patient is agreeable to me speaking to her psychiatrist and requesting that they manage all of her psychiatric medications Advised patient that I will not prescribe long-term benzos One month supply of Ativan provided with no plan to refill Advised patient that if psychiatry will not manage her benzos, they may be able to provide alternative therapy and we can taper her off of her benzos Follow-up in one month I will send a letter to her psychiatrist detailing above

## 2017-04-05 NOTE — Assessment & Plan Note (Signed)
Patient with history of PE and DVT several years ago She has been told that due to her sedentary lifestyle and high risk for recurrent VTE, she will need to be on lifelong Coumadin therapy INR supratherapeutic today Decrease Coumadin dose to 10 mg daily except for 5 mg 3 times weekly, Monday Wednesday Friday Recheck INR in Coumadin clinic in 2 weeks and adjust Coumadin dose as indicated

## 2017-04-05 NOTE — Assessment & Plan Note (Signed)
Discussed importance of weight management Discussed diet and exercise

## 2017-04-05 NOTE — Patient Instructions (Signed)

## 2017-04-05 NOTE — Assessment & Plan Note (Signed)
Will need risk factor management Check lipid panel at next visit

## 2017-04-05 NOTE — Progress Notes (Signed)
Patient: Stacey [name] Female    DOB: 10-14-1960   56 y.o.   MRN: [id number] Visit Date: 04/05/2017  Today's Provider: Lavon Paganini, MD   Chief Complaint  Patient presents with  . Establish Care   Subjective:    HPI   Ms. Stacey [name] presents today to establish care. She previously saw Stacey Chou, DNP at [facility]. She has a H/O HTN, PE and DVT, TIA, OSA and DM 2. She was unhappy at previous practice because her PCP did not communicate well. She feels fairly well, She does not exercise. She is sleeping well.  Itchy and clogged up b/l ears, feels as though hearing is decreased b/l x2 weeks, would like to have this checked out today. Has to pop her ears frequently, +nasal congestion, no fevers, hearing is as if she is in a tunnel  Reports that she needs refills on BP meds, coumadin, lorazepam  T2DM: Last A1c 6 in 2017, tried to eat low carb (though has fallen off the wagon recently), used to take metformin and glimepiride (did not tolerate), not on any medications now, no exercise  OSA: on CPAP for years, new CPAP being ordered, thinks she is seeing pulmonology for this  H/o PE and DVT in 2014, does not know if she is on lifelong coumadin (current dose 10mg  daily, except 2 days weekly at 5mg )  HTN: Taking lisinopril 10mg , prescribed daily, only taking once weekly, has been 1-2 weeks since she has taken lisinopril, states BP is always good at doctors offices  Depression/anxiety: seeing Neuropsych in [place], they prescribe lexapro, buspar, and wellbutrin.  She is unsure why they do not prescribe ativan. Taking ativan 2mg  TID consistently, agrees to consolidation of care  Allergies  Allergen Reactions  . Hydrocodone Anaphylaxis  . Nitroglycerin Anxiety  . Tramadol Anaphylaxis and Swelling  . Hydromorphone Other (See Comments)    Dilaudid. "hallucinations" has tolerated morphine, but "it doesn't work very well"  . Levaquin [Levofloxacin In D5w] Nausea And  Vomiting  . Toradol [Ketorolac Tromethamine] Swelling  . Levofloxacin Other (See Comments)  . Sulfa Antibiotics Rash  . Tape Rash  . Trazodone And Nefazodone Other (See Comments)     Current Outpatient Prescriptions:  .  albuterol (PROVENTIL HFA;VENTOLIN HFA) 108 (90 Base) MCG/ACT inhaler, Inhale 2 puffs into the lungs every 6 (six) hours as needed for wheezing or shortness of breath., Disp: 1 Inhaler, Rfl: 0 .  buPROPion (WELLBUTRIN XL) 300 MG 24 hr tablet, Take 300 mg by mouth daily. , Disp: , Rfl:  .  busPIRone (BUSPAR) 15 MG tablet, Take 2 tablets (30 mg total) by mouth 2 (two) times daily., Disp: 120 tablet, Rfl: 1 .  escitalopram (LEXAPRO) 20 MG tablet, Take 20 mg by mouth daily. , Disp: , Rfl:  .  lisinopril (PRINIVIL,ZESTRIL) 10 MG tablet, Take 10 mg by mouth daily. , Disp: , Rfl:  .  LORazepam (ATIVAN) 2 MG tablet, Take 2 mg by mouth 3 (three) times daily as needed for anxiety. , Disp: , Rfl: 0 .  nystatin cream (MYCOSTATIN), Apply 1 application topically 2 (two) times daily as needed for dry skin. , Disp: , Rfl: 3 .  warfarin (COUMADIN) 10 MG tablet, TAKE 1 TABLET BY MOUTH ONCE DAILY, Disp: 30 tablet, Rfl: 3 No current facility-administered medications for this visit.   Facility-Administered Medications Ordered in Other Visits:  .  HYDROcodone-acetaminophen (NORCO/VICODIN) 5-325 MG per tablet 1 tablet, 1 tablet, Oral, Q6H PRN, Black, Stacey Glad  M, NP  Review of Systems  Constitutional: Positive for fatigue and unexpected weight change (gain). Negative for activity change, appetite change, chills, diaphoresis and fever.  HENT: Positive for dental problem. Negative for congestion, drooling, ear discharge, ear pain, facial swelling, hearing loss, mouth sores, nosebleeds, postnasal drip, rhinorrhea, sinus pain, sinus pressure, sneezing, sore throat, tinnitus, trouble swallowing and voice change.   Eyes: Positive for visual disturbance. Negative for photophobia, pain, discharge, redness and  itching.  Respiratory: Positive for apnea. Negative for cough, choking, chest tightness, shortness of breath, wheezing and stridor.   Cardiovascular: Positive for palpitations. Negative for chest pain and leg swelling.  Gastrointestinal: Negative.   Endocrine: Positive for polyphagia and polyuria. Negative for cold intolerance, heat intolerance and polydipsia.  Genitourinary: Positive for urgency. Negative for decreased urine volume, difficulty urinating, dyspareunia, dysuria, enuresis, flank pain, frequency, genital sores, hematuria, menstrual problem, pelvic pain, vaginal bleeding, vaginal discharge and vaginal pain.  Musculoskeletal: Positive for arthralgias, back pain, myalgias, neck pain and neck stiffness. Negative for gait problem and joint swelling.  Skin: Negative.   Allergic/Immunologic: Negative.   Neurological: Positive for numbness. Negative for dizziness, tremors, seizures, syncope, facial asymmetry, speech difficulty, weakness, light-headedness and headaches.  Hematological: Negative for adenopathy. Bruises/bleeds easily.  Psychiatric/Behavioral: Positive for decreased concentration. Negative for agitation, behavioral problems, confusion, dysphoric mood, hallucinations, self-injury, sleep disturbance and suicidal ideas. The patient is nervous/anxious. The patient is not hyperactive.     Social History  Substance Use Topics  . Smoking status: Former Research scientist (life sciences)  . Smokeless tobacco: Never Used     Comment: former light cigarette smoker  . Alcohol use No   Objective:   BP 116/84 (BP Location: Left Wrist, Patient Position: Sitting, Cuff Size: Large)   Pulse 78   Temp 97.9 F (36.6 C) (Oral)   Resp 16   Ht 5\' 6"  (1.676 [name])   Wt (!) 388 lb (176 kg)   LMP 06/24/2014   SpO2 97%   BMI 62.62 kg/[name]  Vitals:   04/05/17 1426  BP: 116/84  Pulse: 78  Resp: 16  Temp: 97.9 F (36.6 C)  TempSrc: Oral  SpO2: 97%  Weight: (!) 388 lb (176 kg)  Height: 5\' 6"  (1.676 [name])     Physical  Exam  Constitutional: She is oriented to person, place, and time. She appears well-developed and well-nourished. No distress.  Sitting in wheelchair, obese  HENT:  Head: Normocephalic and atraumatic.  Right Ear: Tympanic membrane, external ear and ear canal normal.  Left Ear: Tympanic membrane, external ear and ear canal normal.  Nose: Nose normal.  Mouth/Throat: Oropharynx is clear and moist.  Eyes: Pupils are equal, round, and reactive to light. Conjunctivae and EOM are normal. No scleral icterus.  Neck: Neck supple. No thyromegaly present.  Cardiovascular: Normal rate, regular rhythm and normal heart sounds.   No murmur heard. Pulmonary/Chest: Breath sounds normal. No respiratory distress. She has no wheezes. She has no rales.  Abdominal: Soft. She exhibits no distension. There is no tenderness. There is no rebound and no guarding.  Musculoskeletal: She exhibits no edema or deformity.  Lymphadenopathy:    She has no cervical adenopathy.  Neurological: She is alert and oriented to person, place, and time.  Skin: Skin is warm and dry. No rash noted.  Psychiatric: She has a normal mood and affect. Her behavior is normal.  Vitals reviewed.      Assessment & Plan:     HTN (hypertension) Well-controlled Not taking medication regularly, only once  every 1-2 weeks We will stop lisinopril and monitor blood pressure without medications Encouraged any exercise as possible Encourage low-sodium diet Follow-up in one month  Pulmonary embolism Patient with history of PE and DVT several years ago She has been told that due to her sedentary lifestyle and high risk for recurrent VTE, she will need to be on lifelong Coumadin therapy INR supratherapeutic today Decrease Coumadin dose to 10 mg daily except for 5 mg 3 times weekly, Monday Wednesday Friday Recheck INR in Coumadin clinic in 2 weeks and adjust Coumadin dose as indicated  TIA (transient ischemic attack) Will need risk factor  management Check lipid panel at next visit  OSA (obstructive sleep apnea) Controlled on C Pap  Diabetes mellitus type II, controlled ([place]) Diet controlled with A1c 6.0 Encouraged diet and exercise  Morbid obesity ([place]) Discussed importance of weight management Discussed diet and exercise  Anxiety and depression Long discussion regarding risk of long-term benzodiazepine therapy Patient is agreeable to me speaking to her psychiatrist and requesting that they manage all of her psychiatric medications Advised patient that I will not prescribe long-term benzos One month supply of Ativan provided with no plan to refill Advised patient that if psychiatry will not manage her benzos, they may be able to provide alternative therapy and we can taper her off of her benzos Follow-up in one month I will send a letter to her psychiatrist detailing above  Dysfunction of both eustachian tubes No evidence of AOM on exam Symptoms are most consistent with eustachian tube dysfunction Trial of Flonase daily      The entirety of the information documented in the History of Present Illness, Review of Systems and Physical Exam were personally obtained by me. Portions of this information were initially documented by Raquel Sarna Ratchford, CMA and reviewed by me for thoroughness and accuracy.     Stacey Paganini, MD  [facility]

## 2017-04-05 NOTE — Assessment & Plan Note (Signed)
Controlled on C Pap

## 2017-04-05 NOTE — Assessment & Plan Note (Signed)
No evidence of AOM on exam Symptoms are most consistent with eustachian tube dysfunction Trial of Flonase daily

## 2017-04-05 NOTE — Assessment & Plan Note (Signed)
Diet controlled with A1c 6.0 Encouraged diet and exercise

## 2017-04-05 NOTE — Assessment & Plan Note (Signed)
Well-controlled Not taking medication regularly, only once every 1-2 weeks We will stop lisinopril and monitor blood pressure without medications Encouraged any exercise as possible Encourage low-sodium diet Follow-up in one month

## 2017-04-19 ENCOUNTER — Ambulatory Visit: Payer: Self-pay

## 2017-04-22 ENCOUNTER — Emergency Department (HOSPITAL_COMMUNITY)
Admission: EM | Admit: 2017-04-22 | Discharge: 2017-04-22 | Discharge status: Against Medical Advice | Payer: Medicaid Other

## 2017-04-22 NOTE — ED Notes (Signed)
No answer in waiting area.

## 2017-05-03 ENCOUNTER — Ambulatory Visit: Payer: Self-pay | Admitting: Family Medicine

## 2017-05-08 ENCOUNTER — Telehealth: Payer: Self-pay | Admitting: Family Medicine

## 2017-05-08 NOTE — Telephone Encounter (Signed)
lmtcb to ask questions regarding the pa.

## 2017-05-08 NOTE — Telephone Encounter (Signed)
Pt states that [place] [facility] has sent a prior auth for the Rx .  Pt is asking if this was rec'd? YS#[id number]

## 2017-05-08 NOTE — Telephone Encounter (Signed)
Pt is taking lorazepam 2 mg TID. Pt has tried alprazolam and clonazepam in the past. Alprazolam caused hypersomnolence, and clonazepam was ineffective. Pt also tried antidepressants, including zoloft, wellbutrin, lexapro and buspar. These medications have not improved sx as well as lorazepam. Psych is hesitant to fill this medication. Pt states her psychiatrist said, "the way the government is, I am afraid I will be deported if I refill this". She is trying to establish with psych at [facility], but it will take a while to establish with them. She states she can decrease this to BID if needed. Is requesting a call back from PCP before 11:30 am tomorrow (05/09/2017) if you have any questions.

## 2017-05-08 NOTE — Telephone Encounter (Signed)
Pt called back regarding PA questions.  Was not able to reach CMA.  Please call back [phone].

## 2017-05-09 NOTE — Telephone Encounter (Signed)
Patient was given a prescription for a one-month supply of her Ativan on 04/05/17 at new patient appointment. According to the [facility] controlled substances database, she has not filled this prescription and last filled refill on Ativan was 03/13/17.  Another prescription will not be given as she seems to have one that she has not filled. I also advised patient at that appointment that chronic benzos would not be prescribed. It is telling that her psychiatrist's is not willing to treat her with this medicine long term either. If psychiatry will not fill this medication, we will need to plan for a taper and discontinuation that will occur over the next few months.  Virginia Crews, MD, MPH [facility] 05/09/2017 8:48 AM

## 2017-05-09 NOTE — Telephone Encounter (Signed)
PA completed  Bacigalupo, Dionne Bucy, MD, MPH [facility] 05/09/2017 9:56 AM

## 2017-05-09 NOTE — Telephone Encounter (Signed)
I think the medication wasn't filled because it needs a prior authorization. Fax from pharmacy was sent yesterday.

## 2017-05-09 NOTE — Telephone Encounter (Signed)
PA faxed and pt advised.

## 2017-05-10 ENCOUNTER — Encounter: Payer: Self-pay | Admitting: Family Medicine

## 2017-05-10 ENCOUNTER — Telehealth: Payer: Self-pay | Admitting: Family Medicine

## 2017-05-10 ENCOUNTER — Ambulatory Visit (INDEPENDENT_AMBULATORY_CARE_PROVIDER_SITE_OTHER): Payer: Medicaid Other | Admitting: Family Medicine

## 2017-05-10 VITALS — BP 122/80 | HR 60 | Temp 97.5°F | Resp 16 | Wt 392.0 lb

## 2017-05-10 DIAGNOSIS — F419 Anxiety disorder, unspecified: Secondary | ICD-10-CM | POA: Diagnosis not present

## 2017-05-10 DIAGNOSIS — I10 Essential (primary) hypertension: Secondary | ICD-10-CM | POA: Diagnosis not present

## 2017-05-10 DIAGNOSIS — I2699 Other pulmonary embolism without acute cor pulmonale: Secondary | ICD-10-CM | POA: Diagnosis not present

## 2017-05-10 DIAGNOSIS — H5711 Ocular pain, right eye: Secondary | ICD-10-CM | POA: Diagnosis not present

## 2017-05-10 DIAGNOSIS — F329 Major depressive disorder, single episode, unspecified: Secondary | ICD-10-CM | POA: Diagnosis not present

## 2017-05-10 DIAGNOSIS — F32A Depression, unspecified: Secondary | ICD-10-CM

## 2017-05-10 DIAGNOSIS — H5710 Ocular pain, unspecified eye: Secondary | ICD-10-CM | POA: Insufficient documentation

## 2017-05-10 DIAGNOSIS — Z7901 Long term (current) use of anticoagulants: Secondary | ICD-10-CM

## 2017-05-10 LAB — POCT INR
INR: 3.2
PT: 38.9

## 2017-05-10 NOTE — Telephone Encounter (Signed)
1. Pt called to get a status update on her prior authorization for LORazepam (ATIVAN) 2 MG tablet.   2. Pt also stated that she has an appt this afternoon @ 4 pm and wanted to see if it was too late to cancel that appt and reschedule for Friday. I advised pt that we require at least notice the day before to cancel or reschedule appt and that if she can't come in this afternoon it would be a "No Show". Pt stated that she is in a wheelchair and she would get soaked if she came in this afternoon because it is raining but,  that she would try to come in this afternoon to avoid a "No Show". Should I call back and reschedule the appt or leave appt at 4 pm today. Please advise. Thanks TNP

## 2017-05-10 NOTE — Telephone Encounter (Signed)
Appears patient was able to make it to her visit today.  We can discuss her Ativan, but she only called yesterday about PA and this does take some time to be processed.  Rx was given 1 month ago, so this process could have been started sooner to avoid delay.  Virginia Crews, MD, MPH [facility] 05/10/2017 4:15 PM

## 2017-05-10 NOTE — Progress Notes (Signed)
Patient: Stacey [name] Female    DOB: 05-21-1961   56 y.o.   MRN: [id number] Visit Date: 05/11/2017  Today's Provider: Lavon Paganini, MD   Chief Complaint  Patient presents with  . Hypertension  . Pulmonary Embolism   Subjective:    HPI      Hypertension, follow-up:  BP Readings from Last 3 Encounters:  05/10/17 122/80  04/05/17 116/84  03/16/17 (!) 153/62    She was last seen for hypertension 1 months ago.  BP at that visit was 116/84. Management since that visit includes discontinuing lisinopril due to normal BP without consistently taking medication. Pt was advised to monitor BP at home. She reports fair compliance with treatment. She is not checking BP, but did stop medications. She is not exercising. She is adherent to low salt diet.   Outside blood pressures are not being checked. She is experiencing dyspnea, fatigue and irregular heart beat. SOB for the last couple days; improved with albuterol inhaler. Patient denies chest pain, claudication, lower extremity edema, near-syncope, orthopnea, palpitations and syncope.   Cardiovascular risk factors include diabetes mellitus, dyslipidemia, hypertension, obesity (BMI >= 30 kg/m2) and sedentary lifestyle.    Weight trend: decreasing steadily Wt Readings from Last 3 Encounters:  05/10/17 (!) 392 lb (177.8 kg)  04/05/17 (!) 388 lb (176 kg)  03/16/17 (!) 408 lb (185.1 kg)    Current diet: in general, an "unhealthy" diet - states that she eats unhealthy because she can't eat "any" vegetables while taking coumadin  ------------------------------------------------------------------------  Follow up for PE  The patient was last seen for this 4 weeks ago. Changes made at last visit include decreasing Coumadin to 10 mg daily. Except 5 mg three times a week.  She reports poor compliance with treatment. Pt is still taking 10 mg Coumadin daily. States every doctor tells her take a different dose, and is unsure  of why this needs to change. Pt's INR is 3.2 today, which is unchanged from last check.  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 - she states that she Has seen her life with and without Ativan and she prefers that with Ativan - was seeing a psychiatrist in [place] who refused to prescribe benzos, even though all other psych meds were prescribed there - she is awaiting an appt with [facility]  R eye pain - States she has been having a feeling of pressure above and behind her right eye for 2 weeks - It is unchanged over that period of time - States that she has not yet taken her Coumadin today because she was afraid that her INR would be high and that would mean that this pressure is coming from a bleed in her brain - No acute vision changes, though patient has been blurry for many months, no eye pain with movement, no eye redness, no trauma or injury to the area - Does admit to some nasal congestion that has been present for about the same amount of time   Allergies  Allergen Reactions  . Hydrocodone Anaphylaxis  . Nitroglycerin Anxiety  . Tramadol Anaphylaxis and Swelling  .  Hydromorphone Other (See Comments)    Dilaudid. "hallucinations" has tolerated morphine, but "it doesn't work very well"  . Levaquin [Levofloxacin In D5w] Nausea And Vomiting  . Toradol [Ketorolac Tromethamine] Swelling  . Levofloxacin Other (See Comments)  . Sulfa Antibiotics Rash  . Tape Rash  . Trazodone And Nefazodone Other (See Comments)     Current Outpatient Prescriptions:  .  albuterol (PROVENTIL  HFA;VENTOLIN HFA) 108 (90 Base) MCG/ACT inhaler, Inhale 2 puffs into the lungs every 6 (six) hours as needed for wheezing or shortness of breath., Disp: 1 Inhaler, Rfl: 0 .  buPROPion (WELLBUTRIN XL) 300 MG 24 hr tablet, Take 300 mg by mouth daily. , Disp: , Rfl:  .  busPIRone (BUSPAR) 15 MG tablet, Take 2 tablets (30 mg total) by mouth 2 (two) times daily., Disp: 120 tablet, Rfl: 1 .  escitalopram (LEXAPRO) 20 MG tablet, Take 20 mg by mouth daily. , Disp: , Rfl:  .  warfarin (COUMADIN) 10 MG tablet, Take 10mg  daily x5days per week and 5mg  daily 2 days weekly, Disp: 30 tablet, Rfl: 3 .  fluticasone (FLONASE) 50 MCG/ACT nasal spray, Place 2 sprays into both nostrils daily. (Patient not taking: Reported on 05/10/2017), Disp: 16 g, Rfl: 6 .  LORazepam (ATIVAN) 2 MG tablet, Take 1 tablet (2 mg total) by mouth 3 (three) times daily as needed for anxiety. (Patient not taking: Reported on 05/10/2017), Disp: 90 tablet, Rfl: 0 .  nystatin cream (MYCOSTATIN), Apply 1 application topically 2 (two) times daily as needed for dry skin. , Disp: , Rfl: 3  Review of Systems  Constitutional: Positive for fatigue. Negative for activity change, appetite change, chills, diaphoresis, fever and unexpected weight change.  HENT: Positive for congestion. Negative for ear pain, facial swelling, postnasal drip, rhinorrhea and sneezing.   Eyes: Positive for pain. Negative for photophobia, discharge, redness, itching and visual disturbance.  Respiratory: Positive for shortness of breath. Negative for cough and choking.   Cardiovascular: Negative for chest pain, palpitations and leg swelling.  Gastrointestinal: Negative.   Endocrine: Positive for cold intolerance and polyuria.  Genitourinary: Negative.   Musculoskeletal: Positive for arthralgias, back pain and gait problem.  Neurological: Positive for headaches. Negative for dizziness, tremors, seizures, syncope, facial asymmetry, speech difficulty, weakness, light-headedness  and numbness.  Psychiatric/Behavioral: Negative for agitation, behavioral problems, confusion, hallucinations and suicidal ideas. The patient is nervous/anxious.     Social History  Substance Use Topics  . Smoking status: Former Smoker    Years: 2.00    Types: Cigarettes    Quit date: 2003  . Smokeless tobacco: Never Used     Comment: former light cigarette smoker  . Alcohol use No   Objective:   BP 122/80 (BP Location: Left Wrist, Patient Position: Sitting, Cuff Size: Large)   Pulse 60   Temp (!) 97.5 F (36.4 C) (Oral)   Resp 16   Wt (!) 392 lb (177.8 kg)   LMP 06/24/2014   BMI 63.27 kg/m  Vitals:   05/10/17 1617  BP: 122/80  Pulse: 60  Resp: 16  Temp: (!) 97.5 F (36.4 C)  TempSrc: Oral  Weight: (!) 392 lb (177.8 kg)     Physical Exam  Constitutional: She is oriented to person, place, and time. She appears well-developed and well-nourished. No distress.  HENT:  Head: Normocephalic and atraumatic.  Right Ear: Tympanic membrane, external ear and ear canal normal.  Left Ear: Tympanic membrane, external ear and ear canal normal.  Nose: Nose normal. Right sinus  exhibits no maxillary sinus tenderness and no frontal sinus tenderness. Left sinus exhibits no maxillary sinus tenderness and no frontal sinus tenderness.  Mouth/Throat: Uvula is midline, oropharynx is clear and moist and mucous membranes are normal.  Eyes: Pupils are equal, round, and reactive to light. Conjunctivae and EOM are normal. Right eye exhibits no discharge. Left eye exhibits no discharge. No scleral icterus.  Neck: Neck supple. No thyromegaly present.  Cardiovascular: Normal rate, regular rhythm, normal heart sounds and intact distal pulses.   No murmur heard. Pulmonary/Chest: Effort normal and breath sounds normal. No respiratory distress. She has no wheezes. She has no rales.  Abdominal: Soft. Bowel sounds are normal. She exhibits no distension. There is no tenderness. There is no rebound and no  guarding.  Musculoskeletal: She exhibits edema (1+ b/l ankles).  Lymphadenopathy:    She has no cervical adenopathy.  Neurological: She is alert and oriented to person, place, and time.  Skin: Skin is warm and dry. No rash noted.  Psychiatric: Her behavior is normal.  Vitals reviewed.   Results for orders placed or performed in visit on 05/10/17  POCT INR  Result Value Ref Range   INR 3.2    PT 38.9        Assessment & Plan:      Problem List Items Addressed This Visit      Cardiovascular and Mediastinum   HTN (hypertension) (Chronic)    Well-controlled without any medications Encourage any exercise as possible Encourage low-sodium diet Follow-up in 3 months      Pulmonary embolism - Primary    History of PE and DVT several years ago She was told that due to her sedentary lifestyle and high risk of recurrent PTE, she will be on lifelong Coumadin therapy INR supratherapeutic today Decrease Coumadin dose to 10 mg daily except for 5 mg on Wednesdays and Sundays We will follow-up in 2 weeks, recheck INR, and adjust Coumadin dose as indicated Long discussion with the patient regarding vitamin K containing foods - she states that she has been told she can eat no fruits or vegetables without affecting her Coumadin, so we discussed that that is not true and there are many fruits and vegetables that are low in vitamin K and as long as she eats a consistent amount, we can adjust her Coumadin accordingly      Relevant Orders   POCT INR     Other   Anxiety and depression    Another long discussion regarding risk of long-term benzodiazepine therapy Her psychiatrist has been hesitant to prescribe this medication for her Advised patient that I will not prescribe long-term benzos and plan to taper her off of them if she remains in my care for her anxiety She is seeking out care from another psychiatrist at this time She has not filled her 1 month supply of Ativan that was provided at  last visit and she is awaiting a prior authorization for this to be filled and covered by Medicaid Follow-up in one month      Eye pain    No red flags findings to suggest ophthalmologic emergency This could be related to sinus fullness and pressure related to weather changes and allergic rhinitis Could also be related to glaucoma, so we will refer to ophthalmology for formal exam      Relevant Orders   Ambulatory referral to Ophthalmology   Long term (current) use of anticoagulants    On lifelong Coumadin therapy See discussion regarding Coumadin  dosage changes and follow-up INR above         Return in about 2 weeks (around 05/24/2017) for diabetes, INR f/u.      The entirety of the information documented in the History of Present Illness, Review of Systems and Physical Exam were personally obtained by me. Portions of this information were initially documented by Raquel Sarna Ratchford, CMA and reviewed by me for thoroughness and accuracy.     Lavon Paganini, MD  [facility]

## 2017-05-10 NOTE — Patient Instructions (Addendum)
Take Coumadin 5mg  daily on Wednesday and Sundays.  Take 10mg  daily every other day of the week.   Vitamin K Foods and Warfarin Warfarin is a blood thinner (anticoagulant). Anticoagulant medicines help prevent the formation of blood clots. These medicines work by decreasing the activity of vitamin K, which promotes normal blood clotting. When you take warfarin, problems can occur from suddenly increasing or decreasing the amount of vitamin K that you eat from one day to the next. Problems may include:  Blood clots.  Bleeding.  What general guidelines do I need to follow? To avoid problems when taking warfarin:  Eat a balanced diet that includes: ? Fresh fruits and vegetables. ? Whole grains. ? Low-fat dairy products. ? Lean proteins, such as fish, eggs, and lean cuts of meat.  Keep your intake of vitamin K consistent from day to day. To do this: ? Avoid eating large amounts of vitamin K one day and low amounts of vitamin K the next day. ? If you take a multivitamin that contains vitamin K, be sure to take it every day. ? Know which foods contain vitamin K. Use the lists below to understand serving sizes and the amount of vitamin K in one serving.  Avoid major changes in your diet. If you are going to change your diet, talk with your health care provider before making changes.  Work with a Financial planner (dietitian) to develop a meal plan that works best for you.  High vitamin K foods Foods that are high in vitamin K contain more than 100 mcg (micrograms) per serving. These include:  Broccoli (cooked) -  cup has 110 mcg.  Brussels sprouts (cooked) -  cup has 109 mcg.  Greens, beet (cooked) -  cup has 350 mcg.  Greens, collard (cooked) -  cup has 418 mcg.  Greens, turnip (cooked) -  cup has 265 mcg.  Green onions or scallions -  cup has 105 mcg.  Kale (fresh or frozen) -  cup has 531 mcg.  Parsley (raw) - 10 sprigs has 164 mcg.  Spinach (cooked) -  cup has  444 mcg.  Swiss chard (cooked) -  cup has 287 mcg.  Moderate vitamin K foods Foods that have a moderate amount of vitamin K contain 25-100 mcg per serving. These include:  Asparagus (cooked) - 5 spears have 38 mcg.  Black-eyed peas (dried) -  cup has 32 mcg.  Cabbage (cooked) -  cup has 37 mcg.  Kiwi fruit - 1 medium has 31 mcg.  Lettuce - 1 cup has 57-63 mcg.  Okra (frozen) -  cup has 44 mcg.  Prunes (dried) - 5 prunes have 25 mcg.  Watercress (raw) - 1 cup has 85 mcg.  Low vitamin K foods Foods low in vitamin K contain less than 25 mcg per serving. These include:  Artichoke - 1 medium has 18 mcg.  Avocado - 1 oz. has 6 mcg.  Blueberries -  cup has 14 mcg.  Cabbage (raw) -  cup has 21 mcg.  Carrots (cooked) -  cup has 11 mcg.  Cauliflower (raw) -  cup has 11 mcg.  Cucumber with peel (raw) -  cup has 9 mcg.  Grapes -  cup has 12 mcg.  Mango - 1 medium has 9 mcg.  Nuts - 1 oz. has 15 mcg.  Pear - 1 medium has 8 mcg.  Peas (cooked) -  cup has 19 mcg.  Pickles - 1 spear has 14 mcg.  Pumpkin seeds -  1 oz. has 13 mcg.  Sauerkraut (canned) -  cup has 16 mcg.  Soybeans (cooked) -  cup has 16 mcg.  Tomato (raw) - 1 medium has 10 mcg.  Tomato sauce -  cup has 17 mcg.  Vitamin K-free foods If a food contain less than 5 mcg per serving, it is considered to have no vitamin K. These foods include:  Bread and cereal products.  Cheese.  Eggs.  Fish and shellfish.  Meat and poultry.  Milk and dairy products.  Sunflower seeds.  Actual amounts of vitamin K in foods may be different depending on processing. Talk with your dietitian about what foods you can eat and what foods you should avoid. This information is not intended to replace advice given to you by your health care provider. Make sure you discuss any questions you have with your health care provider. Document Released: 05/15/2009 Document Revised: 02/07/2016 Document Reviewed:  10/21/2015 Elsevier Interactive Patient Education  2017 [place].   Anticoagulation Warfarin Dose Instructions as of 05/10/2017      Dorene Grebe Tue Wed Thu Fri Sat   New Dose 2.5 mg 10 mg 10 mg 2.5 mg 10 mg 10 mg 10 mg    Description   Take 10 mg daily, except 5 mg on Wednesday and Sunday.

## 2017-05-11 ENCOUNTER — Telehealth: Payer: Self-pay | Admitting: Family Medicine

## 2017-05-11 DIAGNOSIS — Z7901 Long term (current) use of anticoagulants: Secondary | ICD-10-CM | POA: Insufficient documentation

## 2017-05-11 NOTE — Assessment & Plan Note (Signed)
Another long discussion regarding risk of long-term benzodiazepine therapy Her psychiatrist has been hesitant to prescribe this medication for her Advised patient that I will not prescribe long-term benzos and plan to taper her off of them if she remains in my care for her anxiety She is seeking out care from another psychiatrist at this time She has not filled her 1 month supply of Ativan that was provided at last visit and she is awaiting a prior authorization for this to be filled and covered by Medicaid Follow-up in one month

## 2017-05-11 NOTE — Assessment & Plan Note (Signed)
On lifelong Coumadin therapy See discussion regarding Coumadin dosage changes and follow-up INR above

## 2017-05-11 NOTE — Assessment & Plan Note (Signed)
History of PE and DVT several years ago She was told that due to her sedentary lifestyle and high risk of recurrent PTE, she will be on lifelong Coumadin therapy INR supratherapeutic today Decrease Coumadin dose to 10 mg daily except for 5 mg on Wednesdays and Sundays We will follow-up in 2 weeks, recheck INR, and adjust Coumadin dose as indicated Long discussion with the patient regarding vitamin K containing foods - she states that she has been told she can eat no fruits or vegetables without affecting her Coumadin, so we discussed that that is not true and there are many fruits and vegetables that are low in vitamin K and as long as she eats a consistent amount, we can adjust her Coumadin accordingly

## 2017-05-11 NOTE — Telephone Encounter (Signed)
I was aware of patient's high opiate use.  This is a reason that I am planning to taper and d/c the medication.  We cannot stop it cold [place], however given possibility of withdrawals.  There is a plan to taper dose and frequency every month while closely monitoring patient.  Patient is aware of risk.  Please ask pharmacy to go ahead and fill Rx.  Virginia Crews, MD, MPH [facility] 05/11/2017 11:41 AM

## 2017-05-11 NOTE — Telephone Encounter (Signed)
Pt states [place] states there is a drug interaction with the Rx LORazepam (ATIVAN) 2 MG tablet.  CM#[id number]/VW

## 2017-05-11 NOTE — Assessment & Plan Note (Signed)
No red flags findings to suggest ophthalmologic emergency This could be related to sinus fullness and pressure related to weather changes and allergic rhinitis Could also be related to glaucoma, so we will refer to ophthalmology for formal exam

## 2017-05-11 NOTE — Telephone Encounter (Signed)
Spoke with pharmacist, Gerald Stabs at [facility]. He informed me that the Lorazepam is contraindicating with Oxycodone 10 mg that is being prescribed by Chilton Greathouse, NP (pain management). Pt is prescribed #150 of Oxycodone 10 mg per month, and is taking up to 5 tablets per day. Please advise.

## 2017-05-11 NOTE — Telephone Encounter (Signed)
Called [facility], and they advised me that pharmacy can override this interaction. Gerald Stabs was advised that the Lorazepam is going to be tapered, and that per [facility], pharmacy should be able to override this. Gerald Stabs states he ask how to override this. Pt advised.

## 2017-05-11 NOTE — Assessment & Plan Note (Signed)
Well-controlled without any medications Encourage any exercise as possible Encourage low-sodium diet Follow-up in 3 months

## 2017-05-24 ENCOUNTER — Ambulatory Visit (INDEPENDENT_AMBULATORY_CARE_PROVIDER_SITE_OTHER): Payer: Medicaid Other | Admitting: Family Medicine

## 2017-05-24 ENCOUNTER — Encounter: Payer: Self-pay | Admitting: Family Medicine

## 2017-05-24 VITALS — BP 112/72 | HR 72 | Temp 98.0°F | Resp 20

## 2017-05-24 DIAGNOSIS — R809 Proteinuria, unspecified: Secondary | ICD-10-CM | POA: Diagnosis not present

## 2017-05-24 DIAGNOSIS — F32A Depression, unspecified: Secondary | ICD-10-CM

## 2017-05-24 DIAGNOSIS — F329 Major depressive disorder, single episode, unspecified: Secondary | ICD-10-CM

## 2017-05-24 DIAGNOSIS — E119 Type 2 diabetes mellitus without complications: Secondary | ICD-10-CM

## 2017-05-24 DIAGNOSIS — Z7901 Long term (current) use of anticoagulants: Secondary | ICD-10-CM

## 2017-05-24 DIAGNOSIS — I2699 Other pulmonary embolism without acute cor pulmonale: Secondary | ICD-10-CM

## 2017-05-24 DIAGNOSIS — F419 Anxiety disorder, unspecified: Secondary | ICD-10-CM

## 2017-05-24 LAB — POCT UA - MICROALBUMIN: MICROALBUMIN (UR) POC: 50 mg/L

## 2017-05-24 LAB — POCT INR
INR: 1.4
PT: 16.6

## 2017-05-24 MED ORDER — LISINOPRIL 5 MG PO TABS: 5.0000 mg | ORAL_TABLET | Freq: Every day | ORAL | 3 refills | Status: DC

## 2017-05-24 NOTE — Patient Instructions (Signed)

## 2017-05-24 NOTE — Progress Notes (Signed)
Patient: Stacey [name] Female    DOB: 11/02/1960   56 y.o.   MRN: [id number] Visit Date: 05/25/2017  Today's Provider: Lavon Paganini, MD   Chief Complaint  Patient presents with  . Anxiety  . Pulmonary Embolism   Subjective:    HPI     Follow up for Anxiety  The patient was last seen for this 2 weeks ago. Changes made at last visit include tapering off of high dose of Lorazepam.  She is currently taking Lorazepam 2 mg 1 tablet TID.  States she was eventually able to get this filled.  Had been held up by prior auth and pharmacist concerns She feels that condition is Unchanged. She is not having side effects.   ------------------------------------------------------------------------------------ Anticoagulation: Patient here for anticoagulation monitoring. 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 Medication Changes:  no Dietary Changes:  no Bacterial/Viral Infection:  no  Other Concerns:  no    T2DM - Checking BG at home: no - Medications: none - Compliance: n/a - Diet: states that she does not watch her carb intake - Exercise: none - eye exam: has it scheduled 11/2 - foot exam: needs - but refuses today - microalbumin: done today - denies symptoms of hypoglycemia, polyuria, polydipsia, numbness extremities, foot ulcers/trauma   Allergies  Allergen Reactions  . Hydrocodone Anaphylaxis  . Nitroglycerin Anxiety  . Tramadol Anaphylaxis and Swelling  . Hydromorphone Other (See Comments)    Dilaudid. "hallucinations" has tolerated morphine, but "it doesn't work very well"  . Levaquin [Levofloxacin In D5w] Nausea And Vomiting  . Toradol [Ketorolac Tromethamine] Swelling  . Levofloxacin Other (See Comments)  . Sulfa Antibiotics Rash  . Tape Rash  . Trazodone  And Nefazodone Other (See Comments)     Current Outpatient Prescriptions:  .  albuterol (PROVENTIL HFA;VENTOLIN HFA) 108 (90 Base) MCG/ACT inhaler, Inhale 2 puffs into the lungs every 6 (six) hours as needed for wheezing or shortness of breath., Disp: 1 Inhaler, Rfl: 0 .  buPROPion (WELLBUTRIN XL) 300 MG 24 hr tablet, Take 300 mg by mouth daily. , Disp: , Rfl:  .  busPIRone (BUSPAR) 15 MG tablet, Take 2 tablets (30 mg total) by mouth 2 (two) times daily., Disp: 120 tablet, Rfl: 1 .  escitalopram (LEXAPRO) 20 MG tablet, Take 20 mg by mouth daily. , Disp: , Rfl:  .  fluticasone (FLONASE) 50 MCG/ACT nasal spray, Place 2 sprays into both nostrils daily., Disp: 16 g, Rfl: 6 .  LORazepam (ATIVAN) 2 MG tablet, Take 1 tablet (2 mg total) by mouth 3 (three) times daily as needed for anxiety., Disp: 90 tablet, Rfl: 0 .  nystatin cream (MYCOSTATIN), Apply 1 application topically 2 (two) times daily as needed for dry skin. , Disp: , Rfl: 3 .  warfarin (COUMADIN) 10 MG tablet, Take 10mg  daily x5days per week and 5mg  daily 2 days weekly, Disp: 30 tablet, Rfl: 3 .  lisinopril (PRINIVIL,ZESTRIL) 5 MG tablet, Take 1 tablet (5 mg total) by mouth daily., Disp: 90 tablet, Rfl: 3  Review of Systems  Constitutional: Negative.   HENT: Negative.   Respiratory: Negative.   Cardiovascular: Negative.   Gastrointestinal: Negative.   Genitourinary: Negative.   Musculoskeletal: Positive for arthralgias, back pain, gait problem and myalgias. Negative for joint swelling.  Neurological: Negative for dizziness and speech difficulty.  Psychiatric/Behavioral: The patient is nervous/anxious.     Social History  Substance Use Topics  . Smoking status: Former Smoker    Years: 2.00    Types: Cigarettes    Quit date: 2003  . Smokeless tobacco: Never Used     Comment: former light cigarette smoker  . Alcohol use No   Objective:   BP 112/72 (BP Location: Left Wrist, Patient Position: Sitting, Cuff Size: Large)   Pulse  72   Temp 98 F (36.7 C) (Oral)   Resp 20   LMP 06/24/2014  Vitals:   05/24/17 1632  BP: 112/72  Pulse: 72  Resp: 20  Temp: 98 F (36.7 C)  TempSrc: Oral     Physical Exam  Constitutional: She is oriented to person, place, and time. She appears well-developed and well-nourished. No distress.  HENT:  Head: Normocephalic and atraumatic.  Right Ear: External ear normal.  Left Ear: External ear normal.  Mouth/Throat: Oropharynx is clear and moist.  Eyes: Conjunctivae are normal. No scleral icterus.  Neck: Neck supple. No thyromegaly present.  Cardiovascular: Normal rate, regular rhythm, normal heart sounds and intact distal pulses.   No murmur heard. Pulmonary/Chest: Effort normal and breath sounds normal. No respiratory distress. She has no wheezes. She has no rales.  Abdominal: Soft. She exhibits no distension. There is no tenderness. There is no rebound and no guarding.  Musculoskeletal: She exhibits no edema.  Lymphadenopathy:    She has no cervical adenopathy.  Neurological: She is alert and oriented to person, place, and time.  Skin: Skin is warm and dry. No rash noted.  Psychiatric:  Intermittently tearful, depressed mood, flat affect  Vitals reviewed.    Results for orders placed or performed in visit on 05/24/17  POCT INR  Result Value Ref Range   INR 1.4    PT 16.6   POCT UA - Microalbumin  Result Value Ref Range   Microalbumin Ur, POC 50 mg/L       Assessment & Plan:      Problem List Items Addressed This Visit      Cardiovascular and Mediastinum   Pulmonary embolism    History of PE and DVT several years ago She was told that due to her sedentary lifestyle and high risk of recurrent VT E where she will be on lifelong Coumadin therapy INR subtherapeutic today Advised patient again that she should not change her dose or skip doses on her own without physician recommendation We will increase her Coumadin dose to 10 mg daily, and adjust Coumadin dose  as indicated      Relevant Medications   lisinopril (PRINIVIL,ZESTRIL) 5 MG tablet     Endocrine   Diabetes mellitus type II, controlled ([place])    Diet controlled with recent A1c 6.0 Encourage diet and exercise Urine microalbumin completed today which shows microalbuminuria Advised patient to let her ophthalmologist know at her upcoming appointment that she has diabetes so that she will get a retinal exam Patient declines foot exam today, so we will do this at her next appointment We will also attempt to update vaccinations at next visit -- patient declines today      Relevant Medications   lisinopril (PRINIVIL,ZESTRIL) 5 MG tablet   Other Relevant Orders   POCT UA - Microalbumin (Completed)     Other   Anxiety and depression    Patient complains today about her hesitance to decrease her benzo therapy Both her psychiatrist  and pharmacist have been hesitant for her to take this higher dose, as I am as well  Follow-up in 2 weeks and start taper for Ativan       Long term (current) use of anticoagulants - Primary    On lifelong Coumadin therapy See discussion regarding Coumadin dose changes follow-up INR above Again reiterated to patient not to change her Coumadin dose without direction or skip doses      Microalbuminuria    Urine microalbumin of 50 Start lisinopril 5 mg daily given her normal blood pressure today Follow-up on blood pressure at next visit in 2 weeks          Return in about 2 weeks (around 06/07/2017) for INR, anxiety f/u.      The entirety of the information documented in the History of Present Illness, Review of Systems and Physical Exam were personally obtained by me. Portions of this information were initially documented by Raquel Sarna Ratchford, CMA and reviewed by me for thoroughness and accuracy.     Lavon Paganini, MD  [facility]

## 2017-05-25 NOTE — Assessment & Plan Note (Signed)
Diet controlled with recent A1c 6.0 Encourage diet and exercise Urine microalbumin completed today which shows microalbuminuria Advised patient to let her ophthalmologist know at her upcoming appointment that she has diabetes so that she will get a retinal exam Patient declines foot exam today, so we will do this at her next appointment We will also attempt to update vaccinations at next visit -- patient declines today

## 2017-05-25 NOTE — Assessment & Plan Note (Signed)
History of PE and DVT several years ago She was told that due to her sedentary lifestyle and high risk of recurrent VT E where she will be on lifelong Coumadin therapy INR subtherapeutic today Advised patient again that she should not change her dose or skip doses on her own without physician recommendation We will increase her Coumadin dose to 10 mg daily, and adjust Coumadin dose as indicated

## 2017-05-25 NOTE — Assessment & Plan Note (Signed)
Patient complains today about her hesitance to decrease her benzo therapy Both her psychiatrist and pharmacist have been hesitant for her to take this higher dose, as I am as well  Follow-up in 2 weeks and start taper for Ativan

## 2017-05-25 NOTE — Assessment & Plan Note (Signed)
On lifelong Coumadin therapy See discussion regarding Coumadin dose changes follow-up INR above Again reiterated to patient not to change her Coumadin dose without direction or skip doses

## 2017-05-25 NOTE — Assessment & Plan Note (Signed)
Urine microalbumin of 50 Start lisinopril 5 mg daily given her normal blood pressure today Follow-up on blood pressure at next visit in 2 weeks

## 2017-06-07 ENCOUNTER — Ambulatory Visit: Payer: Self-pay | Admitting: Family Medicine

## 2017-07-08 ENCOUNTER — Ambulatory Visit (INDEPENDENT_AMBULATORY_CARE_PROVIDER_SITE_OTHER): Payer: Medicaid Other | Admitting: Family Medicine

## 2017-07-08 ENCOUNTER — Encounter: Payer: Self-pay | Admitting: Family Medicine

## 2017-07-08 VITALS — BP 124/60 | HR 74 | Temp 98.1°F | Resp 18

## 2017-07-08 DIAGNOSIS — I2699 Other pulmonary embolism without acute cor pulmonale: Secondary | ICD-10-CM | POA: Diagnosis not present

## 2017-07-08 DIAGNOSIS — R42 Dizziness and giddiness: Secondary | ICD-10-CM

## 2017-07-08 DIAGNOSIS — E782 Mixed hyperlipidemia: Secondary | ICD-10-CM | POA: Insufficient documentation

## 2017-07-08 DIAGNOSIS — Z86718 Personal history of other venous thrombosis and embolism: Secondary | ICD-10-CM | POA: Insufficient documentation

## 2017-07-08 DIAGNOSIS — Z86711 Personal history of pulmonary embolism: Secondary | ICD-10-CM | POA: Insufficient documentation

## 2017-07-08 LAB — POCT INR
INR: 2.5
PT: 30.3

## 2017-07-08 NOTE — Patient Instructions (Signed)
Description   Continue 10 mg daily and follow up in 4 weeks

## 2017-07-08 NOTE — Progress Notes (Signed)
Patient: Stacey [name] Female    DOB: 26-Oct-1960   56 y.o.   MRN: [id number] Visit Date: 07/08/2017  Today's Provider: Lelon Huh, MD   Chief Complaint  Patient presents with  . Coumadin check and complains of lightheadedness   Subjective:    HPI Anticoagulation: Patient here for anticoagulation monitoring. She reports being dizzy and lightheaded. She is concerned her INR is out of range. Last INR check was on 05/24/17 and results were 1.4. Patient advised to increase Coumadin to 10 mg daily and follow up in 2 weeks. She reports being confused with dose change and taking medication incorrectly.   She states that she gets dizzy when her warfarin levels are off, but also when she has a lot of anxiety, which she has been lately. Denies chest pains, dyspnea, or orthostatic symptoms.   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 Hydrocodone Anaphylaxis  . Nitroglycerin Anxiety  . Tramadol Anaphylaxis and Swelling  . Hydromorphone Other (See Comments)    Dilaudid. "hallucinations" has tolerated morphine, but "it doesn't work very well"  . Levaquin [Levofloxacin In D5w] Nausea And Vomiting  . Toradol [Ketorolac Tromethamine] Swelling  . Levofloxacin Other (See Comments)  . Sulfa Antibiotics Rash  . Tape Rash  . Trazodone And Nefazodone Other (See Comments)     Current Outpatient Medications:  .  albuterol (PROVENTIL HFA;VENTOLIN HFA) 108 (90 Base) MCG/ACT inhaler, Inhale 2 puffs into the lungs every 6 (six) hours as needed for wheezing or shortness of breath., Disp: 1 Inhaler, Rfl: 0 .  buPROPion (WELLBUTRIN XL) 300 MG 24 hr tablet, Take 300 mg by mouth daily. , Disp: , Rfl:  .  busPIRone (BUSPAR) 15 MG tablet, Take 2  tablets (30 mg total) by mouth 2 (two) times daily., Disp: 120 tablet, Rfl: 1 .  escitalopram (LEXAPRO) 20 MG tablet, Take 20 mg by mouth daily. , Disp: , Rfl:  .  fluticasone (FLONASE) 50 MCG/ACT nasal spray, Place 2 sprays into both nostrils daily., Disp: 16 g, Rfl: 6 .  lisinopril (PRINIVIL,ZESTRIL) 5 MG tablet, Take 1 tablet (5 mg total) by mouth daily., Disp: 90 tablet, Rfl: 3 .  LORazepam (ATIVAN) 2 MG tablet, Take 1 tablet (2 mg total) by mouth 3 (three) times daily as needed for anxiety., Disp: 90 tablet, Rfl: 0 .  nystatin cream (MYCOSTATIN), Apply 1 application topically 2 (two) times daily as needed for dry skin. , Disp: , Rfl: 3 .  warfarin (COUMADIN) 10 MG tablet, Take 10mg  daily x5days per week and 5mg  daily 2 days weekly, Disp: 30 tablet, Rfl: 3  Review of Systems  Constitutional: Negative.   Respiratory: Negative.   Cardiovascular: Negative.   Neurological: Positive for dizziness and light-headedness.    Social History   Tobacco Use  . Smoking status: Former Smoker    Years: 2.00    Types: Cigarettes    Last attempt to quit: 2003    Years since quitting: 15.9  . Smokeless tobacco: Never Used  . Tobacco comment: former light cigarette smoker  Substance Use Topics  . Alcohol use: No   Objective:   BP 124/60 (BP Location: Left Arm, Patient Position: Sitting, Cuff Size: Large)   Pulse 74   Temp 98.1 F (36.7 C) (  Oral)   Resp 18   LMP 06/24/2014   SpO2 98%  Vitals:   07/08/17 1119  BP: 124/60  Pulse: 74  Resp: 18  Temp: 98.1 F (36.7 C)  TempSrc: Oral  SpO2: 98%     Physical Exam   General Appearance:    Alert, cooperative, no distress  Eyes:    PERRL, conjunctiva/corneas clear, EOM's intact       Lungs:     Clear to auscultation bilaterally, respirations unlabored  Heart:    Regular rate and rhythm  Neurologic:   Awake, alert, oriented x 3. No apparent focal neurological           defect.       Results for orders placed or performed in visit on  07/08/17  POCT INR  Result Value Ref Range   INR 2.5    PT 30.3        Assessment & Plan:     1. Other pulmonary embolism without acute cor pulmonale, unspecified chronicity (HCC)  - POCT INR  2. Dizziness She states she was only concerned about dizziness because she thought her PT/INR may be off. States it is not unusual for her and that she gets this way when she is anxious. She only wanted INR checked and declines any additional evaluation.        Lelon Huh, MD  [facility]

## 2017-07-11 ENCOUNTER — Ambulatory Visit (INDEPENDENT_AMBULATORY_CARE_PROVIDER_SITE_OTHER): Payer: Medicaid Other | Admitting: Family Medicine

## 2017-07-11 ENCOUNTER — Encounter: Payer: Self-pay | Admitting: Family Medicine

## 2017-07-11 VITALS — BP 142/88 | HR 79 | Temp 98.1°F | Resp 16

## 2017-07-11 DIAGNOSIS — S81832A Puncture wound without foreign body, left lower leg, initial encounter: Secondary | ICD-10-CM | POA: Diagnosis not present

## 2017-07-11 MED ORDER — CEPHALEXIN 500 MG PO CAPS: 500.0000 mg | ORAL_CAPSULE | Freq: Four times a day (QID) | ORAL | 0 refills | Status: DC

## 2017-07-11 NOTE — Progress Notes (Signed)
Patient: Stacey [name] Female    DOB: [birth year]   56 y.o.   MRN: [id number] Visit Date: 07/11/2017  Today's Provider: Vernie Murders, PA   Chief Complaint  Patient presents with  . Leg Pain   Subjective:    Leg Pain   Incident onset: Saturday. Injury mechanism: slammed leg in the car door. Pain location: right lower leg. The quality of the pain is described as aching and burning. The pain has been worsening since onset. Associated symptoms comments: Swelling and discoloration . Exacerbated by: heat in the car.   Past Medical History:  Diagnosis Date  . Anxiety   . Arthritis   . Asthma   . 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 C. difficile colitis 11/08/2011  . Cyst    pt has "cyst" to legs for years, areas drain at time  . Depression   . Diverticulitis 2016  . DVT (deep venous thrombosis) ([place]) 2014  . Hypertension   . Incisional hernia    in C-section incision  . Musculoskeletal pain 11/08/2011  . Neuropathic pain 11/10/2011  . Pancreatitis 1991  . Pulmonary embolism ([place]) 2014  . Sleep apnea with use of continuous positive airway pressure (CPAP)    uses CPAP at night  . Type 2 diabetes mellitus ([place])    Past Surgical History:  Procedure Laterality Date  . CESAREAN SECTION     x2  . CHOLECYSTECTOMY  1991   laproscopic  . NOSE SURGERY  1982  . SKIN BIOPSY    . TONSILLECTOMY  1980   Family History  Problem Relation Age of Onset  . Stroke Other   . Cancer Other   . Diabetes Other   . Heart disease Mother   . Diabetes Mother   . Cancer Father        lung  . Diabetes Sister   . Diabetes Brother   . Cancer Maternal Aunt   . Alcohol abuse Maternal Uncle   . Cancer Maternal Grandfather   . Diabetes Brother    Allergies  Allergen Reactions  . Hydrocodone Anaphylaxis  . Nitroglycerin Anxiety  . Tramadol Anaphylaxis and Swelling  . Hydromorphone Other (See Comments)    Dilaudid. "hallucinations" has tolerated morphine,  but "it doesn't work very well"  . Levaquin [Levofloxacin In D5w] Nausea And Vomiting  . Toradol [Ketorolac Tromethamine] Swelling  . Levofloxacin Other (See Comments)  . Sulfa Antibiotics Rash  . Tape Rash  . Trazodone And Nefazodone Other (See Comments)    Current Outpatient Medications:  .  albuterol (PROVENTIL HFA;VENTOLIN HFA) 108 (90 Base) MCG/ACT inhaler, Inhale 2 puffs into the lungs every 6 (six) hours as needed for wheezing or shortness of breath., Disp: 1 Inhaler, Rfl: 0 .  buPROPion (WELLBUTRIN XL) 300 MG 24 hr tablet, Take 300 mg by mouth daily. , Disp: , Rfl:  .  busPIRone (BUSPAR) 15 MG tablet, Take 2 tablets (30 mg total) by mouth 2 (two) times daily., Disp: 120 tablet, Rfl: 1 .  escitalopram (LEXAPRO) 20 MG tablet, Take 20 mg by mouth daily. , Disp: , Rfl:  .  fluticasone (FLONASE) 50 MCG/ACT nasal spray, Place 2 sprays into both nostrils daily., Disp: 16 g, Rfl: 6 .  lisinopril (PRINIVIL,ZESTRIL) 5 MG tablet, Take 1 tablet (5 mg total) by mouth daily., Disp: 90 tablet, Rfl: 3 .  LORazepam (ATIVAN) 2 MG tablet, Take 1 tablet (2 mg total) by mouth 3 (three) times daily as  needed for anxiety., Disp: 90 tablet, Rfl: 0 .  nystatin cream (MYCOSTATIN), Apply 1 application topically 2 (two) times daily as needed for dry skin. , Disp: , Rfl: 3 .  warfarin (COUMADIN) 10 MG tablet, Take 10mg  daily x5days per week and 5mg  daily 2 days weekly, Disp: 30 tablet, Rfl: 3  Review of Systems  Constitutional: Negative.   Respiratory: Negative.   Cardiovascular: Positive for leg swelling.   Social History   Tobacco Use  . Smoking status: Former Smoker    Years: 2.00    Types: Cigarettes    Last attempt to quit: 2003    Years since quitting: 15.9  . Smokeless tobacco: Never Used  . Tobacco comment: former light cigarette smoker  Substance Use Topics  . Alcohol use: No   Objective:   BP (!) 142/88 (BP Location: Left Arm, Patient Position: Sitting, Cuff Size: Large)   Pulse 79    Temp 98.1 F (36.7 C) (Oral)   Resp 16   LMP 06/24/2014   SpO2 99%    Physical Exam  Constitutional: She is oriented to person, place, and time. She appears well-developed and well-nourished. No distress.  Morbid obesity.  HENT:  Head: Normocephalic and atraumatic.  Right Ear: Hearing normal.  Left Ear: Hearing normal.  Nose: Nose normal.  Eyes: Conjunctivae and lids are normal. Right eye exhibits no discharge. Left eye exhibits no discharge. No scleral icterus.  Pulmonary/Chest: Effort normal. No respiratory distress.  Musculoskeletal: Normal range of motion.  Neurological: She is alert and oriented to person, place, and time.  Skin: Skin is intact. No lesion and no rash noted.  Erythema around puncture wound right lower leg anteriorly.  Psychiatric: She has a normal mood and affect. Her speech is normal and behavior is normal. Thought content normal.  Media Information   Document Information   Photos    07/11/2017 14:39  Attached To:  Office Visit on 07/11/17 with Chrismon, Vickki Muff, PA  Source Information  Media Information   Source Information   Chrismon, Vickki Muff, [place]  [facility]          Assessment & Plan:     1. Puncture wound of lower leg with complication, left, initial encounter Shut a car door on the left lower leg on 07-08-17. Surrounding hematoma and erythema at the puncture site from the corner of the door. Severe pain with throbbing when in a dependent position. Presently on Coumadin for DVT prevention. Apply ice packs prn discomfort. Allergic to all opiates except Percocet. May use Tylenol prn pain. Will get x-ray evaluation for bony injury and treat with antibiotic not likely to alter Coumadin. Should get Tdap at the pharmacy (prescription written) since we do not have any in house today. Recheck with Dr. Brita Romp in a week. - DG Tibia/Fibula Left - cephALEXin (KEFLEX) 500 MG capsule; Take 1 capsule (500 mg total) by mouth 4 (four) times  daily.  Dispense: 28 capsule; Refill: [place], PA  [facility]

## 2017-07-12 ENCOUNTER — Telehealth: Payer: Self-pay | Admitting: Family Medicine

## 2017-07-12 ENCOUNTER — Ambulatory Visit
Admission: RE | Admit: 2017-07-12 | Discharge: 2017-07-12 | Disposition: A | Discharge status: Home / Self Care | Payer: Medicaid Other | Source: Ambulatory Visit | Attending: Family Medicine | Admitting: Family Medicine

## 2017-07-12 ENCOUNTER — Ambulatory Visit: Admission: RE | Admit: 2017-07-12 | Payer: Medicaid Other | Source: Ambulatory Visit | Admitting: *Deleted

## 2017-07-12 DIAGNOSIS — X58XXXA Exposure to other specified factors, initial encounter: Secondary | ICD-10-CM | POA: Diagnosis not present

## 2017-07-12 DIAGNOSIS — S81832A Puncture wound without foreign body, left lower leg, initial encounter: Secondary | ICD-10-CM | POA: Diagnosis present

## 2017-07-12 NOTE — Telephone Encounter (Signed)
LMTCB  Thanks,  -Joseline 

## 2017-07-12 NOTE — Telephone Encounter (Signed)
Pt stated she saw Stacey [name] on 07/11/17 and was advised to get TDAP but our vaccines were at the hospital due to weather. Pt is requesting a call back to advise when she can come in the office to get the shot or where else she can go. Pt stated that she can not go to the health dept. Please advise. Thanks TNP

## 2017-07-13 ENCOUNTER — Other Ambulatory Visit (INDEPENDENT_AMBULATORY_CARE_PROVIDER_SITE_OTHER): Payer: Self-pay | Admitting: Vascular Surgery

## 2017-07-13 ENCOUNTER — Other Ambulatory Visit: Payer: Self-pay | Admitting: Family Medicine

## 2017-07-13 NOTE — Telephone Encounter (Signed)
As has been discussed with patient in the past, she will need a f/u appt to discuss benzos before refills given.  She has no showed/canceled a few appts for this already.  Her last Rx was for 30 day supply in 04/2017, so she has not been getting this regularly anyways from our office.  Virginia Crews, MD, MPH [facility] 07/13/2017 2:23 PM

## 2017-07-13 NOTE — Telephone Encounter (Signed)
Patient returned call.She is on the scheduled for tomorrow for tdap. She was advised that because of medicaid there's a possibilty that she will need to pay out of pocket $50 or get it for free at the health department. Per patient she was going to get one when she saw Simona Huh and why does it makes a different now.  Thanks,  -Joseline

## 2017-07-14 ENCOUNTER — Other Ambulatory Visit: Payer: Self-pay

## 2017-07-14 ENCOUNTER — Encounter: Payer: Self-pay | Admitting: Emergency Medicine

## 2017-07-14 ENCOUNTER — Telehealth: Payer: Self-pay | Admitting: Family Medicine

## 2017-07-14 ENCOUNTER — Ambulatory Visit: Payer: Self-pay | Admitting: Family Medicine

## 2017-07-14 ENCOUNTER — Emergency Department
Admission: EM | Admit: 2017-07-14 | Discharge: 2017-07-14 | Disposition: A | Discharge status: Home / Self Care | Payer: Medicaid Other | Attending: Emergency Medicine | Admitting: Emergency Medicine

## 2017-07-14 DIAGNOSIS — I10 Essential (primary) hypertension: Secondary | ICD-10-CM | POA: Insufficient documentation

## 2017-07-14 DIAGNOSIS — Z7901 Long term (current) use of anticoagulants: Secondary | ICD-10-CM | POA: Insufficient documentation

## 2017-07-14 DIAGNOSIS — Z87891 Personal history of nicotine dependence: Secondary | ICD-10-CM | POA: Diagnosis not present

## 2017-07-14 DIAGNOSIS — J45909 Unspecified asthma, uncomplicated: Secondary | ICD-10-CM | POA: Diagnosis not present

## 2017-07-14 DIAGNOSIS — R2241 Localized swelling, mass and lump, right lower limb: Secondary | ICD-10-CM | POA: Diagnosis present

## 2017-07-14 DIAGNOSIS — E119 Type 2 diabetes mellitus without complications: Secondary | ICD-10-CM | POA: Insufficient documentation

## 2017-07-14 DIAGNOSIS — Z79899 Other long term (current) drug therapy: Secondary | ICD-10-CM | POA: Diagnosis not present

## 2017-07-14 DIAGNOSIS — L03115 Cellulitis of right lower limb: Secondary | ICD-10-CM | POA: Diagnosis not present

## 2017-07-14 LAB — COMPREHENSIVE METABOLIC PANEL
ALT: 21 U/L (ref 14–54)
AST: 22 U/L (ref 15–41)
Albumin: 3.8 g/dL (ref 3.5–5.0)
Alkaline Phosphatase: 71 U/L (ref 38–126)
Anion gap: 7 (ref 5–15)
BILIRUBIN TOTAL: 0.6 mg/dL (ref 0.3–1.2)
BUN: 19 mg/dL (ref 6–20)
CHLORIDE: 106 mmol/L (ref 101–111)
CO2: 24 mmol/L (ref 22–32)
CREATININE: 0.83 mg/dL (ref 0.44–1.00)
Calcium: 9.4 mg/dL (ref 8.9–10.3)
GFR calc Af Amer: >60 mL/min (ref >60)
Glucose, Bld: 156 mg/dL — ABNORMAL HIGH (ref 65–99)
POTASSIUM: 3.9 mmol/L (ref 3.5–5.1)
Sodium: 137 mmol/L (ref 135–145)
Total Protein: 7.6 g/dL (ref 6.5–8.1)

## 2017-07-14 LAB — CBC
HEMATOCRIT: 38.4 % (ref 35.0–47.0)
Hemoglobin: 12.7 g/dL (ref 12.0–16.0)
MCH: 28.2 pg (ref 26.0–34.0)
MCHC: 33 g/dL (ref 32.0–36.0)
MCV: 85.4 fL (ref 80.0–100.0)
Platelets: 238 10*3/uL (ref 150–440)
RBC: 4.5 MIL/uL (ref 3.80–5.20)
RDW: 13.5 % (ref 11.5–14.5)
WBC: 8.3 10*3/uL (ref 3.6–11.0)

## 2017-07-14 MED ORDER — CLINDAMYCIN HCL 150 MG PO CAPS
300.0000 mg | ORAL_CAPSULE | Freq: Once | ORAL | Status: AC
  Administered 2017-07-14: 300 mg via ORAL
  Filled 2017-07-14: qty 2

## 2017-07-14 MED ORDER — CLINDAMYCIN HCL 150 MG PO CAPS: 300.0000 mg | ORAL_CAPSULE | Freq: Three times a day (TID) | ORAL | 0 refills | Status: DC

## 2017-07-14 NOTE — Telephone Encounter (Signed)
Pt went to ER last night because her leg was still red and swollen and they gave her an antibiotic.  She was in Tuesday and seen Simona Huh for the same leg injury and he gave her an antibiotic.  Pt wants to know if she is suppose to take both antibiotics.  Her call back is [phone]  Thanks teri

## 2017-07-14 NOTE — ED Triage Notes (Signed)
Pt to triage via w/c with no distress noted; reports injuring right lower leg on car door Saturday; was rx keflex but is concerned because of surrounding redness

## 2017-07-14 NOTE — Discharge Instructions (Signed)
Please take all of your antibiotics as prescribed and follow up with your PMD this coming Monday for a recheck.  Return to the ED for any concerns.  It was a pleasure to take care of you today, and thank you for coming to our emergency department.  If you have any questions or concerns before leaving please ask the nurse to grab me and I'm more than happy to go through your aftercare instructions again.  If you were prescribed any opioid pain medication today such as Norco, Vicodin, Percocet, morphine, hydrocodone, or oxycodone please make sure you do not drive when you are taking this medication as it can alter your ability to drive safely.  If you have any concerns once you are home that you are not improving or are in fact getting worse before you can make it to your follow-up appointment, please do not hesitate to call 911 and come back for further evaluation.  Darel Hong, MD  Results for orders placed or performed during the hospital encounter of 07/14/17  CBC  Result Value Ref Range   WBC 8.3 3.6 - 11.0 K/uL   RBC 4.50 3.80 - 5.20 MIL/uL   Hemoglobin 12.7 12.0 - 16.0 g/dL   HCT 38.4 35.0 - 47.0 %   MCV 85.4 80.0 - 100.0 fL   MCH 28.2 26.0 - 34.0 pg   MCHC 33.0 32.0 - 36.0 g/dL   RDW 13.5 11.5 - 14.5 %   Platelets 238 150 - 440 K/uL  Comprehensive metabolic panel  Result Value Ref Range   Sodium 137 135 - 145 mmol/L   Potassium 3.9 3.5 - 5.1 mmol/L   Chloride 106 101 - 111 mmol/L   CO2 24 22 - 32 mmol/L   Glucose, Bld 156 (H) 65 - 99 mg/dL   BUN 19 6 - 20 mg/dL   Creatinine, Ser 0.83 0.44 - 1.00 mg/dL   Calcium 9.4 8.9 - 10.3 mg/dL   Total Protein 7.6 6.5 - 8.1 g/dL   Albumin 3.8 3.5 - 5.0 g/dL   AST 22 15 - 41 U/L   ALT 21 14 - 54 U/L   Alkaline Phosphatase 71 38 - 126 U/L   Total Bilirubin 0.6 0.3 - 1.2 mg/dL   GFR calc non Af Amer >60 >60 mL/min   GFR calc Af Amer >60 >60 mL/min   Anion gap 7 5 - 15   Dg Tibia/fibula Right  Result Date: 07/12/2017 CLINICAL  DATA:  Right leg pain after injury 4 days ago. EXAM: RIGHT TIBIA AND FIBULA - 2 VIEW COMPARISON:  None. FINDINGS: There is no evidence of fracture or other focal bone lesions. Soft tissues are unremarkable. IMPRESSION: No acute abnormality seen in the right tibia or fibula. Electronically Signed   By: Marijo Conception, M.D.   On: 07/12/2017 11:57

## 2017-07-14 NOTE — Telephone Encounter (Signed)
Looks like she was given Clindamycin in the ER. Please advise.

## 2017-07-14 NOTE — Telephone Encounter (Signed)
Pt advised.

## 2017-07-14 NOTE — ED Provider Notes (Signed)
[facility] Emergency Department Provider Note  ____________________________________________   First MD Initiated Contact with Patient 07/14/17 0250     (approximate)  I have reviewed the triage vital signs and the nursing notes.   HISTORY  Chief Complaint Leg Injury   HPI Stacey [name] is a 56 y.o. female who self presents to the emergency department with several days of painful swelling in her right lower extremity.  It all began earlier in the week when she struck her right shin against a piece of furniture.  She followed up with her primary care physician who prescribed her cephalexin 500 mg 4 times a day for possible infection.  She has been taking the medications although she notes the pain is worsened and she feels like the redness is spreading.  She has some numbness and tingling in her toes.  She denies fevers or chills.  Pain is moderate severity worse with exertion and improved with rest.  Past Medical History:  Diagnosis Date  . Anxiety   . Arthritis   . Asthma   . 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 C. difficile colitis 11/08/2011  . Cyst    pt has "cyst" to legs for years, areas drain at time  . Depression   . Diverticulitis 2016  . DVT (deep venous thrombosis) ([place]) 2014  . Hypertension   . Incisional hernia    in C-section incision  . Musculoskeletal pain 11/08/2011  . Neuropathic pain 11/10/2011  . Pancreatitis 1991  . Pulmonary embolism ([place]) 2014  . Sleep apnea with use of continuous positive airway pressure (CPAP)    uses CPAP at night  . Type 2 diabetes mellitus [facility])     Patient Active Problem List   Diagnosis Date Noted  . History of DVT (deep vein thrombosis) 07/08/2017  . Hx pulmonary embolism 07/08/2017  . Mixed hyperlipidemia 07/08/2017  . Microalbuminuria 05/24/2017  . Long term (current) use of anticoagulants 05/11/2017  . Eye pain 05/10/2017  . Dysfunction of both eustachian tubes  04/05/2017  . TIA (transient ischemic attack) 11/15/2015  . LVH (left ventricular hypertrophy) due to hypertensive disease, without heart failure 06/20/2014  . Moderate mitral insufficiency 06/20/2014  . PSVT (paroxysmal supraventricular tachycardia) ([place]) 06/20/2014  . History of gastric ulcer 03/04/2014  . Morbid obesity with body mass index of 50.0-59.9 in adult ([place]) 03/04/2014  . Primary osteoarthritis of right hip 03/04/2014  . Pulmonary embolism 07/16/2013  . Cellulitis of right leg 02/17/2013  . Type 2 diabetes mellitus without complications ([place]) 53/61/4431  . 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 OSA (obstructive sleep apnea) 10/28/2012  . Neuropathic pain 11/10/2011  . PVC's (premature ventricular contractions) 11/07/2011  . Chest pain, atypical 11/07/2011  . Morbid obesity ([place]) 11/06/2011  . Anxiety state 11/06/2011  . HTN (hypertension) 11/06/2011  . Major depressive disorder, recurrent episode, moderate ([place]) 07/15/2010    Past Surgical History:  Procedure Laterality Date  . CESAREAN SECTION     x2  . CHOLECYSTECTOMY  1991   laproscopic  . NOSE SURGERY  1982  . SKIN BIOPSY    . TONSILLECTOMY  1980    Prior to Admission medications   Medication Sig Start Date End Date Taking? Authorizing Provider  albuterol (PROVENTIL HFA;VENTOLIN HFA) 108 (90 Base) MCG/ACT inhaler Inhale 2 puffs into the lungs every 6 (six) hours as needed for wheezing or shortness of breath. 06/24/16   Nance Pear, MD  buPROPion (WELLBUTRIN XL) 300 MG  24 hr tablet Take 300 mg by mouth daily.     [provider]  busPIRone (BUSPAR) 15 MG tablet Take 2 tablets (30 mg total) by mouth 2 (two) times daily. 08/15/13   Dixon Boos, MD  cephALEXin (KEFLEX) 500 MG capsule Take 1 capsule (500 mg total) by mouth 4 (four) times daily. 07/11/17   Chrismon, Vickki Muff, PA  clindamycin (CLEOCIN) 150 MG capsule Take 2 capsules (300 mg total) by mouth 3 (three) times daily for 7 days. 07/14/17  07/21/17  Darel Hong, MD  COUMADIN 10 MG tablet TAKE 1 TABLET BY MOUTH ONCE DAILY 07/13/17   Algernon Huxley, MD  escitalopram (LEXAPRO) 20 MG tablet Take 20 mg by mouth daily.     [provider]  fluticasone (FLONASE) 50 MCG/ACT nasal spray Place 2 sprays into both nostrils daily. 04/05/17   Virginia Crews, MD  lisinopril (PRINIVIL,ZESTRIL) 5 MG tablet Take 1 tablet (5 mg total) by mouth daily. 05/24/17   Bacigalupo, Dionne Bucy, MD  LORazepam (ATIVAN) 2 MG tablet Take 1 tablet (2 mg total) by mouth 3 (three) times daily as needed for anxiety. 04/05/17   Bacigalupo, Dionne Bucy, MD  nystatin cream (MYCOSTATIN) Apply 1 application topically 2 (two) times daily as needed for dry skin.  03/03/15   [provider]  warfarin (COUMADIN) 10 MG tablet Take 10mg  daily x5days per week and 5mg  daily 2 days weekly 04/05/17   Virginia Crews, MD    Allergies Hydrocodone; Nitroglycerin; Tramadol; Hydromorphone; Levaquin [levofloxacin in d5w]; Toradol [ketorolac tromethamine]; Levofloxacin; Sulfa antibiotics; Tape; and Trazodone and nefazodone  Family History  Problem Relation Age of Onset  . Stroke Other   . Cancer Other   . Diabetes Other   . Heart disease Mother   . Diabetes Mother   . Cancer Father        lung  . Diabetes Sister   . Diabetes Brother   . Cancer Maternal Aunt   . Alcohol abuse Maternal Uncle   . Cancer Maternal Grandfather   . Diabetes Brother     Social History Social History   Tobacco Use  . Smoking status: Former Smoker    Years: 2.00    Types: Cigarettes    Last attempt to quit: 2003    Years since quitting: 15.9  . Smokeless tobacco: Never Used  . Tobacco comment: former light cigarette smoker  Substance Use Topics  . Alcohol use: No  . Drug use: No    Review of Systems Constitutional: No fever/chills ENT: No sore throat. Cardiovascular: Denies chest pain. Respiratory: Denies shortness of breath. Gastrointestinal: No abdominal pain.  No  nausea, no vomiting.  No diarrhea.  No constipation. Musculoskeletal: Negative for back pain. Neurological: Negative for headaches   ____________________________________________   PHYSICAL EXAM:  VITAL SIGNS: ED Triage Vitals  Enc Vitals Group     BP 07/14/17 0012 (!) 142/125     Pulse Rate 07/14/17 0012 72     Resp 07/14/17 0012 18     Temp 07/14/17 0012 98.4 F (36.9 C)     Temp Source 07/14/17 0012 Oral     SpO2 07/14/17 0012 98 %     Weight 07/14/17 0012 (!) 402 lb (182.3 kg)     Height 07/14/17 0012 5\' 6"  (1.676 m)     Head Circumference --      Peak Flow --      Pain Score 07/14/17 0019 4     Pain Loc --  Pain Edu? --      Excl. in [place]? --     Constitutional: Alert and oriented x4 morbidly obese nontoxic no diaphoresis speaks in full clear sentences Head: Atraumatic. Nose: No congestion/rhinnorhea. Mouth/Throat: No trismus Neck: No stridor.   Cardiovascular: Regular rate and rhythm Respiratory: Normal respiratory effort.  No retractions. MSK: Bilateral lower extremities mild edema equal in size Neurologic:  Normal speech and language. No gross focal neurologic deficits are appreciated.  Skin: Faint erythema across anterior shin on the right with no obvious abscess    ____________________________________________  LABS (all labs ordered are listed, but only abnormal results are displayed)  Labs Reviewed  COMPREHENSIVE METABOLIC PANEL - Abnormal; Notable for the following components:      Result Value   Glucose, Bld 156 (*)    All other components within normal limits  CBC    Blood work reviewed by me with no acute disease __________________________________________  EKG   ____________________________________________  RADIOLOGY   ____________________________________________   DIFFERENTIAL includes but not limited to  Cellulitis, abscess, necrotizing soft tissue infection   PROCEDURES  Procedure(s) performed:  no  Procedures  Critical Care performed: no  Observation: no ____________________________________________   INITIAL IMPRESSION / ASSESSMENT AND PLAN / ED COURSE  Pertinent labs & imaging results that were available during my care of the patient were reviewed by me and considered in my medical decision making (see chart for details).  Patient is well-appearing hemodynamically stable and clearly not septic.  Her skin has no signs of necrotizing soft tissue infection.  There is no abscess to drain.  She is currently taking cephalexin which is appropriate for strep, however does not have MRSA coverage.  Unfortunately she is allergic to Bactrim so we will cover her with clindamycin for staph instead.  We will give her a 7-day course and follow-up with primary care this coming Monday.  Strict return precautions given and patient verbalized understanding and agreement the plan.      ____________________________________________   FINAL CLINICAL IMPRESSION(S) / ED DIAGNOSES  Final diagnoses:  Cellulitis of right lower extremity      NEW MEDICATIONS STARTED DURING THIS VISIT:  This SmartLink is deprecated. Use AVSMEDLIST instead to display the medication list for a patient.   Note:  This document was prepared using Dragon voice recognition software and may include unintentional dictation errors.      Darel Hong, MD 07/15/17 [phone]

## 2017-07-14 NOTE — ED Notes (Signed)
MD Rifenbark at bedside. 

## 2017-07-14 NOTE — Telephone Encounter (Signed)
Was given Clindamycin prescription in the ER and advised to schedule follow up appointment with Dr. Brita Romp on 07-17-17.

## 2017-07-17 ENCOUNTER — Telehealth: Payer: Self-pay | Admitting: Family Medicine

## 2017-07-17 NOTE — Telephone Encounter (Signed)
Pt called back for an update. Please advise. Thanks TNP

## 2017-07-17 NOTE — Telephone Encounter (Signed)
Pt reports she won't be able to tolerate the Clindamycin anymore.  She states she has "Burning in her throat", but "not heartburn".  She would like to try something else if possible.   Thanks,   -Mickel Baas

## 2017-07-17 NOTE — Telephone Encounter (Signed)
Only had to use the Clindamycin for 7 days. If not having any diarrhea, nausea or vomiting from the Clindamycin, would recommend she finish it. Should only have about 3 days left to finish the prescription. Someone needs to recheck leg this week.

## 2017-07-17 NOTE — Telephone Encounter (Signed)
Pt stated that she saw Simona Huh on 07/11/17 and started pt on cephALEXin (KEFLEX) 500 MG capsule. Pt went to the ED on 07/14/17 b/c her wound looked worse and was hurting more. Pt stated the ED provider started pt on clindamycin (CLEOCIN) 150 MG capsule. Pt stated that clindamycin (CLEOCIN) 150 MG capsule has been keeping her up at night and her system handled the cephALEXin (KEFLEX) 500 MG capsule better. Pt is asking if she can stop the clindamycin and go back to taking the cephALEXin. Pt stated that her leg does seem to be improving. Please advise. Thanks TNP

## 2017-07-17 NOTE — Telephone Encounter (Signed)
Appt scheduled for tomorrow morning at 9.

## 2017-07-17 NOTE — Telephone Encounter (Signed)
Need to see the leg tomorrow morning to decide which antibiotic to use.

## 2017-07-18 ENCOUNTER — Encounter: Payer: Self-pay | Admitting: Family Medicine

## 2017-07-18 ENCOUNTER — Ambulatory Visit (INDEPENDENT_AMBULATORY_CARE_PROVIDER_SITE_OTHER): Payer: Medicaid Other | Admitting: Family Medicine

## 2017-07-18 VITALS — BP 140/82 | HR 65 | Temp 98.4°F | Resp 16

## 2017-07-18 DIAGNOSIS — L03115 Cellulitis of right lower limb: Secondary | ICD-10-CM | POA: Diagnosis not present

## 2017-07-18 DIAGNOSIS — E119 Type 2 diabetes mellitus without complications: Secondary | ICD-10-CM

## 2017-07-18 DIAGNOSIS — I2699 Other pulmonary embolism without acute cor pulmonale: Secondary | ICD-10-CM | POA: Diagnosis not present

## 2017-07-18 DIAGNOSIS — I10 Essential (primary) hypertension: Secondary | ICD-10-CM | POA: Diagnosis not present

## 2017-07-18 LAB — POCT GLYCOSYLATED HEMOGLOBIN (HGB A1C): HEMOGLOBIN A1C: 6.2

## 2017-07-18 NOTE — Patient Instructions (Signed)

## 2017-07-18 NOTE — Progress Notes (Signed)
Patient: Stacey [name] Female    DOB: [birth year]   56 y.o.   MRN: [id number] Visit Date: 07/18/2017  Today's Provider: Vernie Murders, PA   Chief Complaint  Patient presents with  . leg wound follow up   Subjective:    HPI Left leg wound follow up: Patient presents for a follow up. Last OV on 07/11/17. She started cephALEXin (KEFLEX) 500 MG capsule. Patient reports going to the ED on 07/14/17 because wound looked worse and she was experiencing more pain. ED provider started her on clindamycin (CLEOCIN) 150 MG capsule for cellulitis. She states the medication side effects including insomnia, and burning sensation in her throat are intolerable. She was advised to stop medication and follow up today. Patient reports symptoms was gradually improving while taking the clindamycin.  Past Medical History:  Diagnosis Date  . Anxiety   . Arthritis   . Asthma   . 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 C. difficile colitis 11/08/2011  . Cyst    pt has "cyst" to legs for years, areas drain at time  . Depression   . Diverticulitis 2016  . DVT (deep venous thrombosis) ([place]) 2014  . Hypertension   . Incisional hernia    in C-section incision  . Musculoskeletal pain 11/08/2011  . Neuropathic pain 11/10/2011  . Pancreatitis 1991  . Pulmonary embolism ([place]) 2014  . Sleep apnea with use of continuous positive airway pressure (CPAP)    uses CPAP at night  . Type 2 diabetes mellitus ([place])    Past Surgical History:  Procedure Laterality Date  . CESAREAN SECTION     x2  . CHOLECYSTECTOMY  1991   laproscopic  . NOSE SURGERY  1982  . SKIN BIOPSY    . TONSILLECTOMY  1980   Family History  Problem Relation Age of Onset  . Stroke Other   . Cancer Other   . Diabetes Other   . Heart disease Mother   . Diabetes Mother   . Cancer Father        lung  . Diabetes Sister   . Diabetes Brother   . Cancer Maternal Aunt   . Alcohol abuse Maternal Uncle   .  Cancer Maternal Grandfather   . Diabetes Brother    Allergies  Allergen Reactions  . Hydrocodone Anaphylaxis  . Nitroglycerin Anxiety  . Tramadol Anaphylaxis and Swelling  . Hydromorphone Other (See Comments)    Dilaudid. "hallucinations" has tolerated morphine, but "it doesn't work very well"  . Levaquin [Levofloxacin In D5w] Nausea And Vomiting  . Toradol [Ketorolac Tromethamine] Swelling  . Levofloxacin Other (See Comments)  . Sulfa Antibiotics Rash  . Tape Rash  . Trazodone And Nefazodone Other (See Comments)    Current Outpatient Medications:  .  albuterol (PROVENTIL HFA;VENTOLIN HFA) 108 (90 Base) MCG/ACT inhaler, Inhale 2 puffs into the lungs every 6 (six) hours as needed for wheezing or shortness of breath., Disp: 1 Inhaler, Rfl: 0 .  buPROPion (WELLBUTRIN XL) 300 MG 24 hr tablet, Take 300 mg by mouth daily. , Disp: , Rfl:  .  busPIRone (BUSPAR) 15 MG tablet, Take 2 tablets (30 mg total) by mouth 2 (two) times daily., Disp: 120 tablet, Rfl: 1 .  COUMADIN 10 MG tablet, TAKE 1 TABLET BY MOUTH ONCE DAILY, Disp: 30 tablet, Rfl: 3 .  escitalopram (LEXAPRO) 20 MG tablet, Take 20 mg by mouth daily. , Disp: , Rfl:  .  fluticasone (FLONASE)  50 MCG/ACT nasal spray, Place 2 sprays into both nostrils daily., Disp: 16 g, Rfl: 6 .  lisinopril (PRINIVIL,ZESTRIL) 5 MG tablet, Take 1 tablet (5 mg total) by mouth daily., Disp: 90 tablet, Rfl: 3 .  LORazepam (ATIVAN) 2 MG tablet, Take 1 tablet (2 mg total) by mouth 3 (three) times daily as needed for anxiety., Disp: 90 tablet, Rfl: 0 .  nystatin cream (MYCOSTATIN), Apply 1 application topically 2 (two) times daily as needed for dry skin. , Disp: , Rfl: 3 .  warfarin (COUMADIN) 10 MG tablet, Take 10mg  daily x5days per week and 5mg  daily 2 days weekly, Disp: 30 tablet, Rfl: 3  Review of Systems  Constitutional: Negative.   Respiratory: Negative.   Cardiovascular: Negative.   Musculoskeletal:       Left leg pain   Skin:       Redness and  swelling    Social History   Tobacco Use  . Smoking status: Former Smoker    Years: 2.00    Types: Cigarettes    Last attempt to quit: 2003    Years since quitting: 15.9  . Smokeless tobacco: Never Used  . Tobacco comment: former light cigarette smoker  Substance Use Topics  . Alcohol use: No   Objective:   BP 140/82 (BP Location: Left Arm, Patient Position: Sitting, Cuff Size: Large)   Pulse 65   Temp 98.4 F (36.9 C) (Oral)   Resp 16   LMP 06/24/2014   SpO2 99%  Vitals:   07/18/17 0910  BP: 140/82  Pulse: 65  Resp: 16  Temp: 98.4 F (36.9 C)  TempSrc: Oral  SpO2: 99%   Physical Exam  Constitutional: She is oriented to person, place, and time. She appears well-developed and well-nourished.  HENT:  Head: Normocephalic.  Eyes: Conjunctivae are normal.  Neck: Neck supple.  Cardiovascular: Normal rate and regular rhythm.  Pulmonary/Chest: Effort normal and breath sounds normal.  Neurological: She is alert and oriented to person, place, and time.  Skin: There is erythema.  Media Information   Document Information   Photos    07/18/2017 09:51  Attached To:  Office Visit on 07/18/17 with Urijah Arko, Vickki Muff, PA  Source Information   Alliah Boulanger, Vickki Muff, [place]  [facility]       Assessment & Plan:     1. Cellulitis of right leg Still has some burning discomfort in the right lower leg with erythema. Seems essentially unchanged. No fever or drainage from puncture wound. Could not tolerate the Clindamycin due to burning in throat with chest tightness. Will continue Keflex and may need referral to wound care center if no better in a week.  2. Essential hypertension BP borderline. Continues Lisinopril 5 mg qd. Should recheck with Dr. Brita Romp early January 2019.  3. Type 2 diabetes mellitus without complication, without long-term current use of insulin (HCC) No longer on any diabetes medications. No hypoglycemia. Glucose was 156 when check in the ER on  07-14-17.  - POCT glycosylated hemoglobin (Hb A1C)  4. Other pulmonary embolism without acute cor pulmonale, unspecified chronicity (HCC) No dyspnea or chest pains. Continues Coumadin 10 mg qd 5 days a week and 5 mg 2 days a week with INR 2.5 on 07-08-17. Continue present dosage and recheck protime in early January 2019.       Vernie Murders, PA  [facility]

## 2017-08-09 ENCOUNTER — Ambulatory Visit: Payer: Medicaid Other

## 2017-08-16 ENCOUNTER — Other Ambulatory Visit: Payer: Self-pay | Admitting: Family Medicine

## 2017-08-16 NOTE — Telephone Encounter (Signed)
Please review

## 2017-08-16 NOTE — Telephone Encounter (Signed)
Called and someone pick up and hung up.  Thanks,  -Phyliss Hulick

## 2017-08-16 NOTE — Telephone Encounter (Signed)
No refills will be given.  She has not had refills in >3 months and should have been out of medications for a while.   Therefore, should be through withdrawal period.  No need to prescribe any more even for a taper.  Discussed with patient multiple times that we would be tapering and discontinuing this medicaiton.  Virginia Crews, MD, MPH [facility] 08/16/2017 10:49 AM

## 2017-08-18 ENCOUNTER — Other Ambulatory Visit: Payer: Self-pay | Admitting: Family Medicine

## 2017-08-18 NOTE — Telephone Encounter (Signed)
As per last refill request response, will not refill this medication.  We were tapering and patient has not followed up for many months.  Since she has not had this prescribed for several months, she will not withdrawal.  Brita Romp, Dionne Bucy, MD, MPH [facility] 08/18/2017 11:08 AM

## 2017-08-22 ENCOUNTER — Other Ambulatory Visit: Payer: Self-pay | Admitting: Family Medicine

## 2017-08-28 ENCOUNTER — Telehealth: Payer: Self-pay | Admitting: Family Medicine

## 2017-08-28 NOTE — Telephone Encounter (Signed)
Please call patient regarding monitoring of her INR (Coumadin).  She has no showed twice for INR clinic and twice with provider.  It is essential to have regular follow-up for INR and chronic disease.  We should schedule an INR appointment.  If she has 1 more no show, we will dismiss her from our practice as per policy.  This is a warning.  Thanks! Virginia Crews, MD, MPH [facility] 08/28/2017 11:58 AM

## 2017-08-28 NOTE — Telephone Encounter (Signed)
lmtcb

## 2017-08-31 NOTE — Telephone Encounter (Signed)
Pt has not attempted to return my in order to schedule appointment. Mailed letter to home to ask to schedule coumadin check.

## 2017-09-06 ENCOUNTER — Encounter: Payer: Self-pay | Admitting: Emergency Medicine

## 2017-09-06 ENCOUNTER — Emergency Department: Payer: Medicaid Other

## 2017-09-06 ENCOUNTER — Other Ambulatory Visit: Payer: Self-pay

## 2017-09-06 ENCOUNTER — Emergency Department
Admission: EM | Admit: 2017-09-06 | Discharge: 2017-09-06 | Disposition: A | Discharge status: Home / Self Care | Payer: Medicaid Other | Attending: Emergency Medicine | Admitting: Emergency Medicine

## 2017-09-06 DIAGNOSIS — R109 Unspecified abdominal pain: Secondary | ICD-10-CM | POA: Diagnosis not present

## 2017-09-06 DIAGNOSIS — Z79899 Other long term (current) drug therapy: Secondary | ICD-10-CM | POA: Insufficient documentation

## 2017-09-06 DIAGNOSIS — Z8673 Personal history of transient ischemic attack (TIA), and cerebral infarction without residual deficits: Secondary | ICD-10-CM | POA: Insufficient documentation

## 2017-09-06 DIAGNOSIS — E119 Type 2 diabetes mellitus without complications: Secondary | ICD-10-CM | POA: Diagnosis not present

## 2017-09-06 DIAGNOSIS — J45909 Unspecified asthma, uncomplicated: Secondary | ICD-10-CM | POA: Insufficient documentation

## 2017-09-06 DIAGNOSIS — R1084 Generalized abdominal pain: Secondary | ICD-10-CM

## 2017-09-06 DIAGNOSIS — I10 Essential (primary) hypertension: Secondary | ICD-10-CM | POA: Diagnosis not present

## 2017-09-06 DIAGNOSIS — Z86718 Personal history of other venous thrombosis and embolism: Secondary | ICD-10-CM | POA: Insufficient documentation

## 2017-09-06 DIAGNOSIS — Z7901 Long term (current) use of anticoagulants: Secondary | ICD-10-CM | POA: Insufficient documentation

## 2017-09-06 DIAGNOSIS — E782 Mixed hyperlipidemia: Secondary | ICD-10-CM | POA: Diagnosis not present

## 2017-09-06 DIAGNOSIS — Z87891 Personal history of nicotine dependence: Secondary | ICD-10-CM | POA: Insufficient documentation

## 2017-09-06 LAB — COMPREHENSIVE METABOLIC PANEL
ALK PHOS: 61 U/L (ref 38–126)
ALT: 15 U/L (ref 14–54)
ANION GAP: 10 (ref 5–15)
AST: 34 U/L (ref 15–41)
Albumin: 3.8 g/dL (ref 3.5–5.0)
BUN: 14 mg/dL (ref 6–20)
CALCIUM: 9.1 mg/dL (ref 8.9–10.3)
CO2: 21 mmol/L — AB (ref 22–32)
Chloride: 108 mmol/L (ref 101–111)
Creatinine, Ser: 0.94 mg/dL (ref 0.44–1.00)
GFR calc non Af Amer: >60 mL/min (ref >60)
GLUCOSE: 196 mg/dL — AB (ref 65–99)
POTASSIUM: 3.6 mmol/L (ref 3.5–5.1)
Sodium: 139 mmol/L (ref 135–145)
TOTAL PROTEIN: 7.1 g/dL (ref 6.5–8.1)
Total Bilirubin: 0.6 mg/dL (ref 0.3–1.2)

## 2017-09-06 LAB — CBC
HEMATOCRIT: 38.6 % (ref 35.0–47.0)
HEMOGLOBIN: 12.7 g/dL (ref 12.0–16.0)
MCH: 28.2 pg (ref 26.0–34.0)
MCHC: 32.9 g/dL (ref 32.0–36.0)
MCV: 85.6 fL (ref 80.0–100.0)
Platelets: 220 10*3/uL (ref 150–440)
RBC: 4.51 MIL/uL (ref 3.80–5.20)
RDW: 14.3 % (ref 11.5–14.5)
WBC: 6.7 10*3/uL (ref 3.6–11.0)

## 2017-09-06 LAB — URINALYSIS, COMPLETE (UACMP) WITH MICROSCOPIC
BACTERIA UA: NONE SEEN
BILIRUBIN URINE: NEGATIVE
Glucose, UA: NEGATIVE mg/dL
Hgb urine dipstick: NEGATIVE
KETONES UR: NEGATIVE mg/dL
Leukocytes, UA: NEGATIVE
NITRITE: NEGATIVE
PROTEIN: NEGATIVE mg/dL
Specific Gravity, Urine: 1.021 (ref 1.005–1.030)
pH: 5 (ref 5.0–8.0)

## 2017-09-06 LAB — PROTIME-INR
INR: 2.52
Prothrombin Time: 27 seconds — ABNORMAL HIGH (ref 11.4–15.2)

## 2017-09-06 LAB — LIPASE, BLOOD: Lipase: 34 U/L (ref 11–51)

## 2017-09-06 LAB — POCT PREGNANCY, URINE: PREG TEST UR: NEGATIVE

## 2017-09-06 MED ORDER — OXYCODONE-ACETAMINOPHEN 5-325 MG PO TABS
2.0000 | ORAL_TABLET | Freq: Once | ORAL | Status: AC
  Administered 2017-09-06: 2 via ORAL
  Filled 2017-09-06: qty 2

## 2017-09-06 NOTE — ED Triage Notes (Signed)
Patient complaining of abdominal pain starting 4-5 days ago, here today because it is getting worse.  Complaining of "I either have a UTI or C.Diff".  History of C. Diff X 3 per patient.  Patient requesting Coumadin level, states she is on Coumadin but does not have a PCP.

## 2017-09-06 NOTE — ED Notes (Signed)
Returned from xray

## 2017-09-06 NOTE — ED Notes (Signed)
Pt alert and oriented X4, active, cooperative, pt in NAD. RR even and unlabored, color WNL.  Pt informed to return if any life threatening symptoms occur.  Discharge and followup instructions reviewed.  

## 2017-09-06 NOTE — ED Provider Notes (Signed)
[facility] Emergency Department Provider Note       Time seen: ----------------------------------------- 9:53 AM on 09/06/2017 -----------------------------------------   I have reviewed the triage vital signs and the nursing notes.  HISTORY   Chief Complaint Abdominal Pain    HPI Stacey [name] is a 57 y.o. female with a history of anxiety, asthma, C. difficile colitis, depression, diverticulitis, DVT, neuropathic pain, pancreatitis and PE who presents to the ED for abdominal pain for the past 4-5 days.  Patient states she is here today because it is getting worse.  She is complaining of possible UTI or C. difficile colitis.  She has a history of C. difficile in the past.  She also requesting we check her Coumadin level.  Discomfort is 6 out of 10 in the abdomen.  She states she has chronic diarrhea but it is not unusual and does not smell like C. difficile.  Past Medical History:  Diagnosis Date  . Anxiety   . Arthritis   . Asthma   . 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 C. difficile colitis 11/08/2011  . Cyst    pt has "cyst" to legs for years, areas drain at time  . Depression   . Diverticulitis 2016  . DVT (deep venous thrombosis) ([place]) 2014  . Hypertension   . Incisional hernia    in C-section incision  . Musculoskeletal pain 11/08/2011  . Neuropathic pain 11/10/2011  . Pancreatitis 1991  . Pulmonary embolism ([place]) 2014  . Sleep apnea with use of continuous positive airway pressure (CPAP)    uses CPAP at night  . Type 2 diabetes mellitus [facility])     Patient Active Problem List   Diagnosis Date Noted  . History of DVT (deep vein thrombosis) 07/08/2017  . Hx pulmonary embolism 07/08/2017  . Mixed hyperlipidemia 07/08/2017  . Microalbuminuria 05/24/2017  . Long term (current) use of anticoagulants 05/11/2017  . Eye pain 05/10/2017  . Dysfunction of both eustachian tubes 04/05/2017  . TIA (transient ischemic  attack) 11/15/2015  . LVH (left ventricular hypertrophy) due to hypertensive disease, without heart failure 06/20/2014  . Moderate mitral insufficiency 06/20/2014  . PSVT (paroxysmal supraventricular tachycardia) ([place]) 06/20/2014  . History of gastric ulcer 03/04/2014  . Morbid obesity with body mass index of 50.0-59.9 in adult ([place]) 03/04/2014  . Primary osteoarthritis of right hip 03/04/2014  . Pulmonary embolism 07/16/2013  . Cellulitis of right leg 02/17/2013  . Type 2 diabetes mellitus without complications ([place]) 69/48/5462  . 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 OSA (obstructive sleep apnea) 10/28/2012  . Neuropathic pain 11/10/2011  . PVC's (premature ventricular contractions) 11/07/2011  . Chest pain, atypical 11/07/2011  . Morbid obesity ([place]) 11/06/2011  . Anxiety state 11/06/2011  . HTN (hypertension) 11/06/2011  . Major depressive disorder, recurrent episode, moderate ([place]) 07/15/2010    Past Surgical History:  Procedure Laterality Date  . CESAREAN SECTION     x2  . CHOLECYSTECTOMY  1991   laproscopic  . NOSE SURGERY  1982  . SKIN BIOPSY    . TONSILLECTOMY  1980    Allergies Hydrocodone; Nitroglycerin; Tramadol; Hydromorphone; Levaquin [levofloxacin in d5w]; Toradol [ketorolac tromethamine]; Levofloxacin; Sulfa antibiotics; Tape; and Trazodone and nefazodone  Social History Social History   Tobacco Use  . Smoking status: Former Smoker    Years: 2.00    Types: Cigarettes    Last attempt to quit: 2003    Years since quitting: 16.1  . Smokeless tobacco:  Never Used  . Tobacco comment: former light cigarette smoker  Substance Use Topics  . Alcohol use: No  . Drug use: No    Review of Systems Constitutional: Negative for fever. Cardiovascular: Negative for chest pain. Respiratory: Negative for shortness of breath. Gastrointestinal: Positive for abdominal pain Genitourinary: Negative for dysuria. Musculoskeletal: Negative for back pain. Skin: Negative for  rash. Neurological: Negative for headaches, focal weakness or numbness.  All systems negative/normal/unremarkable except as stated in the HPI  ____________________________________________   PHYSICAL EXAM:  VITAL SIGNS: ED Triage Vitals  Enc Vitals Group     BP 09/06/17 0852 (!) 139/52     Pulse Rate 09/06/17 0852 69     Resp 09/06/17 0852 18     Temp 09/06/17 0852 97.7 F (36.5 C)     Temp Source 09/06/17 0852 Oral     SpO2 09/06/17 0852 97 %     Weight 09/06/17 0907 (!) 400 lb (181.4 kg)     Height 09/06/17 0907 5\' 6"  (1.676 m)     Head Circumference --      Peak Flow --      Pain Score 09/06/17 0907 6     Pain Loc --      Pain Edu? --      Excl. in [place]? --     Constitutional: Alert and oriented. Well appearing and in no distress.  Morbid obesity Eyes: Conjunctivae are normal. Normal extraocular movements. ENT   Head: Normocephalic and atraumatic.   Nose: No congestion/rhinnorhea.   Mouth/Throat: Mucous membranes are moist.   Neck: No stridor. Cardiovascular: Normal rate, regular rhythm. No murmurs, rubs, or gallops. Respiratory: Normal respiratory effort without tachypnea nor retractions. Breath sounds are clear and equal bilaterally. No wheezes/rales/rhonchi. Gastrointestinal: Soft and nontender. Normal bowel sounds Musculoskeletal: Nontender with normal range of motion in extremities. No lower extremity tenderness nor edema. Neurologic:  Normal speech and language. No gross focal neurologic deficits are appreciated.  Skin:  Skin is warm, dry and intact. No rash noted. Psychiatric: Mood and affect are normal. Speech and behavior are normal.  ____________________________________________  ED COURSE:  As part of my medical decision making, I reviewed the following data within the [place] History obtained from family if available, nursing notes, old chart and ekg, as well as notes from prior ED visits. Patient presented for abdominal  pain, we will assess with labs and imaging as indicated at this time.   Procedures ____________________________________________   LABS (pertinent positives/negatives)  Labs Reviewed  COMPREHENSIVE METABOLIC PANEL - Abnormal; Notable for the following components:      Result Value   CO2 21 (*)    Glucose, Bld 196 (*)    All other components within normal limits  URINALYSIS, COMPLETE (UACMP) WITH MICROSCOPIC - Abnormal; Notable for the following components:   Color, Urine YELLOW (*)    APPearance HAZY (*)    Squamous Epithelial / LPF 0-5 (*)    All other components within normal limits  PROTIME-INR - Abnormal; Notable for the following components:   Prothrombin Time 27.0 (*)    All other components within normal limits  LIPASE, BLOOD  CBC  POC URINE PREG, ED  POCT PREGNANCY, URINE    RADIOLOGY Images were viewed by me  Abdomen 2 view Did not reveal any acute process ____________________________________________  DIFFERENTIAL DIAGNOSIS   Gastroneuritis, UTI, C. difficile colitis, intestinal cramping, IBS, renal colic, pyelonephritis, diverticulitis  FINAL ASSESSMENT AND PLAN  Abdominal pain   Plan:  Patient had presented for abdominal pain. Patient's labs are reassuring. Patient's imaging are also reassuring.  No clear etiology for abdominal pain is found.  She is stable for outpatient follow-up with her doctor.   Laurence Aly, MD   Note: This note was generated in part or whole with voice recognition software. Voice recognition is usually quite accurate but there are transcription errors that can and very often do occur. I apologize for any typographical errors that were not detected and corrected.     Earleen Newport, MD 09/06/17 [phone]

## 2017-09-06 NOTE — ED Notes (Signed)
Patient to restroom to collect urine specimen.  Labs sent.

## 2017-09-22 ENCOUNTER — Ambulatory Visit: Payer: Medicaid Other | Admitting: Family Medicine

## 2017-09-22 ENCOUNTER — Other Ambulatory Visit: Payer: Self-pay

## 2017-09-22 DIAGNOSIS — Z8619 Personal history of other infectious and parasitic diseases: Secondary | ICD-10-CM

## 2017-09-22 DIAGNOSIS — R140 Abdominal distension (gaseous): Secondary | ICD-10-CM

## 2017-09-22 DIAGNOSIS — R109 Unspecified abdominal pain: Secondary | ICD-10-CM

## 2017-09-22 NOTE — Progress Notes (Signed)
Patient picked up specimen container for stool test. She will bring the stool test back on Monday. She is requesting to be tested for C-diff. She is having abdominal pain and bloating. She reports PMH of C-diff 3 times.

## 2017-09-22 NOTE — Progress Notes (Signed)
Acknowledged and agree with plan.

## 2017-09-22 NOTE — Progress Notes (Deleted)
       Patient: Stacey [name] Female    DOB: [birth year]   57 y.o.   MRN: [id number] Visit Date: 09/22/2017  Today's Provider: Vernie Murders, PA   No chief complaint on file.  Subjective:    Abdominal Pain        Allergies  Allergen Reactions  . Hydrocodone Anaphylaxis  . Nitroglycerin Anxiety  . Tramadol Anaphylaxis and Swelling  . Hydromorphone Other (See Comments)    Dilaudid. "hallucinations" has tolerated morphine, but "it doesn't work very well"  . Levaquin [Levofloxacin In D5w] Nausea And Vomiting  . Toradol [Ketorolac Tromethamine] Swelling  . Levofloxacin Other (See Comments)  . Sulfa Antibiotics Rash  . Tape Rash  . Trazodone And Nefazodone Other (See Comments)     Current Outpatient Medications:  .  albuterol (PROVENTIL HFA;VENTOLIN HFA) 108 (90 Base) MCG/ACT inhaler, Inhale 2 puffs into the lungs every 6 (six) hours as needed for wheezing or shortness of breath., Disp: 1 Inhaler, Rfl: 0 .  buPROPion (WELLBUTRIN XL) 300 MG 24 hr tablet, Take 300 mg by mouth daily. , Disp: , Rfl:  .  busPIRone (BUSPAR) 15 MG tablet, Take 2 tablets (30 mg total) by mouth 2 (two) times daily., Disp: 120 tablet, Rfl: 1 .  COUMADIN 10 MG tablet, TAKE 1 TABLET BY MOUTH ONCE DAILY (Patient taking differently: TAKE 10MG  ON SUNDAY,MONDAY,TUESDAY, FRIDAY, AND SATURDAY/ TAKE 5MG  ON WEDNESDAY AND SUNDAY.), Disp: 30 tablet, Rfl: 3 .  escitalopram (LEXAPRO) 20 MG tablet, Take 20 mg by mouth daily. , Disp: , Rfl:  .  fluticasone (FLONASE) 50 MCG/ACT nasal spray, Place 2 sprays into both nostrils daily., Disp: 16 g, Rfl: 6 .  lisinopril (PRINIVIL,ZESTRIL) 5 MG tablet, Take 1 tablet (5 mg total) by mouth daily., Disp: 90 tablet, Rfl: 3 .  LORazepam (ATIVAN) 2 MG tablet, Take 1 tablet (2 mg total) by mouth 3 (three) times daily as needed for anxiety. (Patient taking differently: Take 1 mg by mouth 2 (two) times daily. ), Disp: 90 tablet, Rfl: 0 .  nystatin cream (MYCOSTATIN), Apply 1  application topically 2 (two) times daily as needed for dry skin. , Disp: , Rfl: 3 .  warfarin (COUMADIN) 10 MG tablet, Take 10mg  daily x5days per week and 5mg  daily 2 days weekly (Patient not taking: Reported on 09/06/2017), Disp: 30 tablet, Rfl: 3  Review of Systems  Constitutional: Negative.   Respiratory: Negative.   Cardiovascular: Negative.   Gastrointestinal: Positive for abdominal distention (bloating) and abdominal pain.    Social History   Tobacco Use  . Smoking status: Former Smoker    Years: 2.00    Types: Cigarettes    Last attempt to quit: 2003    Years since quitting: 16.1  . Smokeless tobacco: Never Used  . Tobacco comment: former light cigarette smoker  Substance Use Topics  . Alcohol use: No   Objective:   LMP 04/24/2017 Comment: Patient states she has a period when Coumadin level drops. There were no vitals filed for this visit.   Physical Exam      Assessment & Plan:           Vernie Murders, PA  [facility]

## 2017-09-25 ENCOUNTER — Telehealth: Payer: Self-pay | Admitting: Family Medicine

## 2017-09-25 MED ORDER — METRONIDAZOLE 500 MG PO TABS: 500.0000 mg | ORAL_TABLET | Freq: Two times a day (BID) | ORAL | 0 refills | Status: DC

## 2017-09-25 NOTE — Telephone Encounter (Signed)
Can try the Metronidazole 500 mg BID #14 while waiting on the stool test results.

## 2017-09-25 NOTE — Telephone Encounter (Signed)
Patient advised. RX called in at [facility]

## 2017-09-25 NOTE — Telephone Encounter (Signed)
Pt is requesting call back to discuss what she should do for her stomach pain, BM odor, & cooper taste in her mouth. Pt stated that she hasn't dropped off a stool sample yet and she feels to bad to sit in a waiting room. Please advise. Thanks TNP

## 2017-09-27 ENCOUNTER — Telehealth: Payer: Self-pay | Admitting: Family Medicine

## 2017-09-27 NOTE — Telephone Encounter (Signed)
Dr. Mirna Mires ([facility]) in [place] needs a note that Stacey [name] is seeing pt. for C-Diff.  They will not let the patient in their office with that.  Does not want patient using "their restroom for a urine test".   Dr. Earnie Larsson fax # is [phone].  This is the only way they will give Stacey [name] her pain meds by Stacey [name] sending this note to them. Per patient they need the note on 09/28/17.

## 2017-09-28 ENCOUNTER — Encounter: Payer: Self-pay | Admitting: Family Medicine

## 2017-09-28 NOTE — Telephone Encounter (Signed)
Left results on machine.

## 2017-09-28 NOTE — Telephone Encounter (Signed)
-----   Message from Margo Common, [place] sent at 09/28/2017 10:20 AM EST ----- So far, all tests negative for C.diff. Awaiting final cytotoxin result.

## 2017-09-28 NOTE — Telephone Encounter (Signed)
LMTCB

## 2017-09-28 NOTE — Telephone Encounter (Signed)
Pt returned call and request call back. Pt asked that if she isn't able to answer the call she request the results be left on her voicemail. Please advise. Thanks TNP

## 2017-10-02 ENCOUNTER — Telehealth: Payer: Self-pay

## 2017-10-02 DIAGNOSIS — R197 Diarrhea, unspecified: Secondary | ICD-10-CM

## 2017-10-02 LAB — C DIFFICILE, CYTOTOXIN B

## 2017-10-02 LAB — C DIFFICILE TOXINS A+B W/RFLX: C DIFFICILE TOXINS A+B, EIA: NEGATIVE

## 2017-10-02 NOTE — Telephone Encounter (Signed)
Patient advised. She states she is still having diarrhea and prefers to proceed with GI referral. Referral ordered.

## 2017-10-02 NOTE — Telephone Encounter (Signed)
-----   Message from Margo Common, [place] sent at 10/02/2017  8:11 AM EST ----- All C.difficile tests are negative. If still having diarrhea issues, will schedule referral to gastroenterologist.

## 2017-10-11 ENCOUNTER — Encounter: Payer: Self-pay | Admitting: Gastroenterology

## 2017-10-11 ENCOUNTER — Ambulatory Visit: Payer: Medicaid Other | Admitting: Gastroenterology

## 2017-10-16 ENCOUNTER — Other Ambulatory Visit (INDEPENDENT_AMBULATORY_CARE_PROVIDER_SITE_OTHER): Payer: Self-pay | Admitting: Vascular Surgery

## 2017-11-07 ENCOUNTER — Ambulatory Visit: Payer: Medicaid Other | Admitting: Family Medicine

## 2017-11-08 ENCOUNTER — Ambulatory Visit (INDEPENDENT_AMBULATORY_CARE_PROVIDER_SITE_OTHER): Payer: Medicaid Other | Admitting: Family Medicine

## 2017-11-08 ENCOUNTER — Encounter: Payer: Self-pay | Admitting: Family Medicine

## 2017-11-08 VITALS — BP 160/64 | HR 75 | Temp 98.2°F | Resp 16

## 2017-11-08 DIAGNOSIS — K219 Gastro-esophageal reflux disease without esophagitis: Secondary | ICD-10-CM

## 2017-11-08 DIAGNOSIS — I2699 Other pulmonary embolism without acute cor pulmonale: Secondary | ICD-10-CM | POA: Diagnosis not present

## 2017-11-08 DIAGNOSIS — H66002 Acute suppurative otitis media without spontaneous rupture of ear drum, left ear: Secondary | ICD-10-CM

## 2017-11-08 LAB — POCT INR
INR: 2.8
PT: 33.4

## 2017-11-08 MED ORDER — AMOXICILLIN-POT CLAVULANATE 875-125 MG PO TABS: 1.0000 | ORAL_TABLET | Freq: Two times a day (BID) | ORAL | 0 refills | Status: AC

## 2017-11-08 NOTE — Addendum Note (Signed)
Addended by: Brennan Bailey on: 11/08/2017 03:30 PM   Modules accepted: Orders

## 2017-11-08 NOTE — Assessment & Plan Note (Signed)
Exam c/w AOM Pain may also be related to very poor dentition Treat with augmentin x7d Return precautions discussed

## 2017-11-08 NOTE — Progress Notes (Signed)
Patient: Stacey [name] Female    DOB: [birth year]   57 y.o.   MRN: [id number] Visit Date: 11/08/2017  Today's Provider: Lavon Paganini, MD   I, Martha Clan, CMA, am acting as scribe for Lavon Paganini, MD.  Chief Complaint  Patient presents with  . Neck Pain   Subjective:    Neck Pain   This is a new problem. Episode onset: x 3 days. The problem occurs constantly. The problem has been unchanged. Associated with: no known injury. The pain is present in the left side. The quality of the pain is described as shooting. The pain is moderate (can be severe). Exacerbated by: daytime activities. The pain is worse during the night. Stiffness is present all day. Associated symptoms include photophobia (baseline) and weakness. Pertinent negatives include no chest pain, fever, headaches, numbness, syncope or visual change. Treatments tried: Percocet, Tylenol. The treatment provided no relief.  Pt is also c/o abdominal pain and nausea. She has tried Tums for this, without relief. No vomiting. Started after eating pizza at 3 am one night.     Allergies  Allergen Reactions  . Hydrocodone Anaphylaxis  . Nitroglycerin Anxiety  . Tramadol Anaphylaxis and Swelling  . Hydromorphone Other (See Comments)    Dilaudid. "hallucinations" has tolerated morphine, but "it doesn't work very well"  . Levaquin [Levofloxacin In D5w] Nausea And Vomiting  . Toradol [Ketorolac Tromethamine] Swelling  . Levofloxacin Other (See Comments)  . Sulfa Antibiotics Rash  . Tape Rash  . Trazodone And Nefazodone Other (See Comments)     Current Outpatient Medications:  .  albuterol (PROVENTIL HFA;VENTOLIN HFA) 108 (90 Base) MCG/ACT inhaler, Inhale 2 puffs into the lungs every 6 (six) hours as needed for wheezing or shortness of breath., Disp: 1 Inhaler, Rfl: 0 .  buPROPion (WELLBUTRIN XL) 300 MG 24 hr tablet, Take 300 mg by mouth daily. , Disp: , Rfl:  .  busPIRone (BUSPAR) 15 MG tablet, Take 2 tablets  (30 mg total) by mouth 2 (two) times daily., Disp: 120 tablet, Rfl: 1 .  COUMADIN 10 MG tablet, TAKE 1 TABLET BY MOUTH ONCE A DAY, Disp: 30 tablet, Rfl: 3 .  escitalopram (LEXAPRO) 20 MG tablet, Take 20 mg by mouth daily. , Disp: , Rfl:  .  lisinopril (PRINIVIL,ZESTRIL) 5 MG tablet, Take 1 tablet (5 mg total) by mouth daily., Disp: 90 tablet, Rfl: 3 .  LORazepam (ATIVAN) 2 MG tablet, Take 1 tablet (2 mg total) by mouth 3 (three) times daily as needed for anxiety. (Patient taking differently: Take 1 mg by mouth 2 (two) times daily. ), Disp: 90 tablet, Rfl: 0 .  warfarin (COUMADIN) 10 MG tablet, Take 10mg  daily x5days per week and 5mg  daily 2 days weekly, Disp: 30 tablet, Rfl: 3 .  fluticasone (FLONASE) 50 MCG/ACT nasal spray, Place 2 sprays into both nostrils daily. (Patient not taking: Reported on 11/08/2017), Disp: 16 g, Rfl: 6 .  nystatin cream (MYCOSTATIN), Apply 1 application topically 2 (two) times daily as needed for dry skin. , Disp: , Rfl: 3  Review of Systems  Constitutional: Negative for fever.  Eyes: Positive for photophobia (baseline).  Respiratory: Positive for shortness of breath.   Cardiovascular: Negative for chest pain and syncope.  Gastrointestinal: Positive for abdominal pain and nausea.  Musculoskeletal: Positive for neck pain.  Neurological: Positive for weakness. Negative for numbness and headaches.    Social History   Tobacco Use  . Smoking status: Former Smoker  Years: 2.00    Types: Cigarettes    Last attempt to quit: 2003    Years since quitting: 16.2  . Smokeless tobacco: Never Used  . Tobacco comment: former light cigarette smoker  Substance Use Topics  . Alcohol use: No   Objective:   BP (!) 160/64 (BP Location: Right Wrist, Patient Position: Sitting, Cuff Size: Large)   Pulse 75   Temp 98.2 F (36.8 C) (Oral)   Resp 16   LMP 04/24/2017 Comment: Patient states she has a period when Coumadin level drops.  SpO2 99%  Vitals:   11/08/17 1346  BP:  (!) 160/64  Pulse: 75  Resp: 16  Temp: 98.2 F (36.8 C)  TempSrc: Oral  SpO2: 99%     Physical Exam  Constitutional: She is oriented to person, place, and time. She appears well-developed and well-nourished. No distress.  Sitting in wheelchair, morbidly obese  HENT:  Head: Normocephalic and atraumatic.  Right Ear: Tympanic membrane, external ear and ear canal normal.  Left Ear: External ear and ear canal normal. Tympanic membrane is erythematous and bulging.  Nose: Nose normal. Right sinus exhibits no maxillary sinus tenderness and no frontal sinus tenderness. Left sinus exhibits no maxillary sinus tenderness and no frontal sinus tenderness.  Mouth/Throat: Oropharynx is clear and moist and mucous membranes are normal. No oral lesions. Abnormal dentition. Dental caries present. No oropharyngeal exudate.  Eyes: Pupils are equal, round, and reactive to light. Conjunctivae are normal. Right eye exhibits no discharge. Left eye exhibits no discharge. No scleral icterus.  Neck: Normal range of motion. Neck supple. No thyromegaly present.  Cardiovascular: Normal rate, regular rhythm and normal heart sounds.  No murmur heard. Pulmonary/Chest: Effort normal and breath sounds normal. No respiratory distress. She has no wheezes.  Abdominal: Soft. She exhibits no distension. There is no tenderness.  Musculoskeletal: She exhibits edema.  Lymphadenopathy:    She has no cervical adenopathy.  Neurological: She is alert and oriented to person, place, and time.  Skin: Skin is warm. Capillary refill takes less than 2 seconds. No rash noted.  Psychiatric: She has a normal mood and affect. Her behavior is normal.  Vitals reviewed.       Assessment & Plan:   Problem List Items Addressed This Visit      Cardiovascular and Mediastinum   Pulmonary embolism    H/o PE and DVT several years ago She was told that due to her sedentary lifestyle and high risk of recurrent VTE she will be on lifelong  Coumadin therapy INR therapeutic Continue current regimen of 10mg  daily except 5mg  twice weekly Advised she will need to f/u on INR in 1 month and will need f/u if needs to have tooth extraction to make plan for coumadin        Digestive   GERD (gastroesophageal reflux disease)    Symptoms c/w GERD Advised on diet Trial of OTC H2 blokcer        Nervous and Auditory   Non-recurrent acute suppurative otitis media of left ear without spontaneous rupture of tympanic membrane - Primary    Exam c/w AOM Pain may also be related to very poor dentition Treat with augmentin x7d Return precautions discussed      Relevant Medications   amoxicillin-clavulanate (AUGMENTIN) 875-125 MG tablet       Return if symptoms worsen or fail to improve. F/u in 4 wks for INR check   The entirety of the information documented in the History of Present Illness,  Review of Systems and Physical Exam were personally obtained by me. Portions of this information were initially documented by Raquel Sarna Ratchford, CMA and reviewed by me for thoroughness and accuracy.    Virginia Crews, MD, MPH [facility] 11/08/2017 2:20 PM

## 2017-11-08 NOTE — Assessment & Plan Note (Signed)
Symptoms c/w GERD Advised on diet Trial of OTC H2 blokcer

## 2017-11-08 NOTE — Patient Instructions (Signed)

## 2017-11-08 NOTE — Assessment & Plan Note (Signed)
H/o PE and DVT several years ago She was told that due to her sedentary lifestyle and high risk of recurrent VTE she will be on lifelong Coumadin therapy INR therapeutic Continue current regimen of 10mg  daily except 5mg  twice weekly Advised she will need to f/u on INR in 1 month and will need f/u if needs to have tooth extraction to make plan for coumadin

## 2017-11-21 ENCOUNTER — Telehealth: Payer: Self-pay | Admitting: Family Medicine

## 2017-11-21 NOTE — Telephone Encounter (Signed)
Pt is requesting Rx for nystatin cream (MYCOSTATIN) to be sent to [facility]. Pt was advised that we don't treat over the phone and we hadn't prescribed the medication in the past. Pt stated that she has a Hx of getting yeast infections under her breast and she knows that is what it is and needs the medication to treat it. I tried to explain that she would need an OV and pt requested that I just send the message back first. Pt was advised that Dr. B had already left for the day and messages can take 24 to 48 hours to get a response. Please advise. Thanks TNP

## 2017-11-22 MED ORDER — NYSTATIN 100000 UNIT/GM EX CREA: 1.0000 "application " | TOPICAL_CREAM | Freq: Two times a day (BID) | CUTANEOUS | 3 refills | Status: DC | PRN

## 2017-11-22 NOTE — Telephone Encounter (Signed)
Refill the patient's nystatin cream was sent to her pharmacy.  If her rash does not improve with this treatment, she will need to be seen for an office visit to further evaluate.  Virginia Crews, MD, MPH [facility] 11/22/2017 8:42 AM

## 2017-11-22 NOTE — Telephone Encounter (Signed)
Please review

## 2017-12-07 ENCOUNTER — Emergency Department: Payer: Medicaid Other

## 2017-12-07 ENCOUNTER — Emergency Department
Admission: EM | Admit: 2017-12-07 | Discharge: 2017-12-07 | Disposition: A | Discharge status: Home / Self Care | Payer: Medicaid Other | Attending: Emergency Medicine | Admitting: Emergency Medicine

## 2017-12-07 ENCOUNTER — Encounter: Payer: Self-pay | Admitting: Emergency Medicine

## 2017-12-07 ENCOUNTER — Other Ambulatory Visit: Payer: Self-pay

## 2017-12-07 DIAGNOSIS — I10 Essential (primary) hypertension: Secondary | ICD-10-CM | POA: Insufficient documentation

## 2017-12-07 DIAGNOSIS — Z87891 Personal history of nicotine dependence: Secondary | ICD-10-CM | POA: Diagnosis not present

## 2017-12-07 DIAGNOSIS — Z79899 Other long term (current) drug therapy: Secondary | ICD-10-CM | POA: Insufficient documentation

## 2017-12-07 DIAGNOSIS — H532 Diplopia: Secondary | ICD-10-CM | POA: Insufficient documentation

## 2017-12-07 DIAGNOSIS — J45909 Unspecified asthma, uncomplicated: Secondary | ICD-10-CM | POA: Insufficient documentation

## 2017-12-07 DIAGNOSIS — E119 Type 2 diabetes mellitus without complications: Secondary | ICD-10-CM | POA: Insufficient documentation

## 2017-12-07 DIAGNOSIS — Z7901 Long term (current) use of anticoagulants: Secondary | ICD-10-CM | POA: Insufficient documentation

## 2017-12-07 NOTE — ED Notes (Signed)
Pt to CT via stretcher accomp by CT tech 

## 2017-12-07 NOTE — ED Notes (Signed)
ED Provider at bedside. 

## 2017-12-07 NOTE — ED Triage Notes (Addendum)
Pt to room 11 via stretcher with no distress noted, brought in by EMS; pt A&Ox3, MAEW, PERRL; st onset double vision 63min PTA; st occurs only with distant vision; pt denies any pain or other accomp symptoms; denies hx of same; resp even/unlab, lungs clear, apical audible & regular; card monitor in place

## 2017-12-07 NOTE — Discharge Instructions (Signed)
It was a pleasure to take care of you today, and thank you for coming to our emergency department.  If you have any questions or concerns before leaving please ask the nurse to grab me and I'm more than happy to go through your aftercare instructions again.  If you were prescribed any opioid pain medication today such as Norco, Vicodin, Percocet, morphine, hydrocodone, or oxycodone please make sure you do not drive when you are taking this medication as it can alter your ability to drive safely.  If you have any concerns once you are home that you are not improving or are in fact getting worse before you can make it to your follow-up appointment, please do not hesitate to call 911 and come back for further evaluation.  Darel Hong, MD  Results for orders placed or performed in visit on 11/08/17  POCT INR  Result Value Ref Range   INR 2.8    PT 33.4    Ct Head Wo Contrast  Result Date: 12/07/2017 CLINICAL DATA:  57 year old female with diplopia. EXAM: CT HEAD WITHOUT CONTRAST TECHNIQUE: Contiguous axial images were obtained from the base of the skull through the vertex without intravenous contrast. COMPARISON:  Head CT dated 11/16/2015 FINDINGS: Brain: No evidence of acute infarction, hemorrhage, hydrocephalus, extra-axial collection or mass lesion/mass effect. Vascular: No hyperdense vessel or unexpected calcification. Skull: Normal. Negative for fracture or focal lesion. Sinuses/Orbits: Small left maxillary sinus retention cyst or polyp. The remainder of the visualized paranasal sinuses and mastoid air cells are clear. Other: None. IMPRESSION: Unremarkable noncontrast CT of the brain. No acute intracranial pathology. Electronically Signed   By: Anner Crete M.D.   On: 12/07/2017 02:46

## 2017-12-07 NOTE — ED Provider Notes (Signed)
[facility] Emergency Department Provider Note  ____________________________________________   First MD Initiated Contact with Patient 12/07/17 [phone]     (approximate)  I have reviewed the triage vital signs and the nursing notes.   HISTORY  Chief Complaint Eye Problem   HPI Stacey [name] is a 57 y.o. female who comes to the emergency department via EMS with double vision that began today.  The double vision occurs when she looks with both eyes or when she closes one eye or the other.  She is no headache.  No nausea or vomiting.  She does not wear glasses.  She has not seen an optometrist or an ophthalmologist in "years".  She said no trauma.  No fevers or chills.  The symptoms are primarily worse when attempting to look at objects far away.  She denies neck pain.  Her symptoms came on gradually and are now constant.  Past Medical History:  Diagnosis Date  . Anxiety   . Arthritis   . Asthma   . 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 C. difficile colitis 11/08/2011  . Cyst    pt has "cyst" to legs for years, areas drain at time  . Depression   . Diverticulitis 2016  . DVT (deep venous thrombosis) ([place]) 2014  . Hypertension   . Incisional hernia    in C-section incision  . Musculoskeletal pain 11/08/2011  . Neuropathic pain 11/10/2011  . Pancreatitis 1991  . Pulmonary embolism ([place]) 2014  . Sleep apnea with use of continuous positive airway pressure (CPAP)    uses CPAP at night  . Type 2 diabetes mellitus [facility])     Patient Active Problem List   Diagnosis Date Noted  . Diplopia 12/08/2017  . Carpal tunnel syndrome 12/08/2017  . Lower extremity edema 12/08/2017  . GERD (gastroesophageal reflux disease) 11/08/2017  . History of DVT (deep vein thrombosis) 07/08/2017  . Mixed hyperlipidemia 07/08/2017  . Microalbuminuria 05/24/2017  . Long term (current) use of anticoagulants 05/11/2017  . Eye pain 05/10/2017  . TIA  (transient ischemic attack) 11/15/2015  . LVH (left ventricular hypertrophy) due to hypertensive disease, without heart failure 06/20/2014  . Moderate mitral insufficiency 06/20/2014  . PSVT (paroxysmal supraventricular tachycardia) ([place]) 06/20/2014  . History of gastric ulcer 03/04/2014  . Primary osteoarthritis of right hip 03/04/2014  . Pulmonary embolism 07/16/2013  . Type 2 diabetes mellitus without complications ([place]) 47/04/6282  . 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 OSA (obstructive sleep apnea) 10/28/2012  . Neuropathic pain 11/10/2011  . PVC's (premature ventricular contractions) 11/07/2011  . Morbid obesity ([place]) 11/06/2011  . Anxiety state 11/06/2011  . HTN (hypertension) 11/06/2011  . Major depressive disorder, recurrent episode, moderate ([place]) 07/15/2010    Past Surgical History:  Procedure Laterality Date  . CESAREAN SECTION     x2  . CHOLECYSTECTOMY  1991   laproscopic  . NOSE SURGERY  1982  . SKIN BIOPSY    . TONSILLECTOMY  1980    Prior to Admission medications   Medication Sig Start Date End Date Taking? Authorizing Provider  albuterol (PROVENTIL HFA;VENTOLIN HFA) 108 (90 Base) MCG/ACT inhaler Inhale 2 puffs into the lungs every 6 (six) hours as needed for wheezing or shortness of breath. 06/24/16   Nance Pear, MD  buPROPion (WELLBUTRIN XL) 300 MG 24 hr tablet Take 300 mg by mouth daily.     [provider]  busPIRone (BUSPAR) 15 MG tablet Take 2 tablets (30  mg total) by mouth 2 (two) times daily. 08/15/13   Dixon Boos, MD  COUMADIN 10 MG tablet TAKE 1 TABLET BY MOUTH ONCE A DAY 10/16/17   Algernon Huxley, MD  escitalopram (LEXAPRO) 20 MG tablet Take 20 mg by mouth daily.     [provider]  fluticasone (FLONASE) 50 MCG/ACT nasal spray Place 2 sprays into both nostrils daily. Patient not taking: Reported on 11/08/2017 04/05/17   Virginia Crews, MD  lisinopril (PRINIVIL,ZESTRIL) 5 MG tablet Take 1 tablet (5 mg total) by mouth daily.  05/24/17   Bacigalupo, Dionne Bucy, MD  LORazepam (ATIVAN) 2 MG tablet Take 1 tablet (2 mg total) by mouth 3 (three) times daily as needed for anxiety. Patient taking differently: Take 1 mg by mouth 2 (two) times daily.  04/05/17   Bacigalupo, Dionne Bucy, MD  nystatin cream (MYCOSTATIN) Apply 1 application topically 2 (two) times daily as needed (candidal rash). 12/08/17   Virginia Crews, MD  warfarin (COUMADIN) 10 MG tablet Take 10mg  daily x5days per week and 5mg  daily 2 days weekly 04/05/17   Virginia Crews, MD    Allergies Hydrocodone; Nitroglycerin; Tramadol; Hydromorphone; Levaquin [levofloxacin in d5w]; Toradol [ketorolac tromethamine]; Levofloxacin; Sulfa antibiotics; Tape; and Trazodone and nefazodone  Family History  Problem Relation Age of Onset  . Stroke Other   . Cancer Other   . Diabetes Other   . Heart disease Mother   . Diabetes Mother   . Cancer Father        lung  . Diabetes Sister   . Diabetes Brother   . Cancer Maternal Aunt   . Alcohol abuse Maternal Uncle   . Cancer Maternal Grandfather   . Diabetes Brother     Social History Social History   Tobacco Use  . Smoking status: Former Smoker    Years: 2.00    Types: Cigarettes    Last attempt to quit: 2003    Years since quitting: 16.3  . Smokeless tobacco: Never Used  . Tobacco comment: former light cigarette smoker  Substance Use Topics  . Alcohol use: No  . Drug use: No    Review of Systems Constitutional: No fever/chills Eyes: Positive for visual changes. ENT: No sore throat. Cardiovascular: Denies chest pain. Respiratory: Denies shortness of breath. Gastrointestinal: No abdominal pain.  No nausea, no vomiting.  No diarrhea.  No constipation. Genitourinary: Negative for dysuria. Musculoskeletal: Negative for back pain. Skin: Negative for rash. Neurological: Negative for headaches, focal weakness or numbness.   ____________________________________________   PHYSICAL EXAM:  VITAL  SIGNS: ED Triage Vitals  Enc Vitals Group     BP      Pulse      Resp      Temp      Temp src      SpO2      Weight      Height      Head Circumference      Peak Flow      Pain Score      Pain Loc      Pain Edu?      Excl. in [place]?     Constitutional: Alert and oriented x4 pleasant cooperative speaks full clear sentences no diaphoresis Eyes: PERRL EOMI. pupils are midrange and brisk normal visual acuity bilaterally Head: Atraumatic. Nose: No congestion/rhinnorhea. Mouth/Throat: No trismus Neck: No stridor.  No meningismus Cardiovascular: Normal rate, regular rhythm. Grossly normal heart sounds.  Good peripheral circulation. Respiratory: Normal respiratory effort.  No retractions. Lungs CTAB and moving good air Gastrointestinal: Morbidly obese soft nontender Musculoskeletal: No lower extremity edema   Neurologic:  Normal speech and language. No gross focal neurologic deficits are appreciated. Skin:  Skin is warm, dry and intact. No rash noted. Psychiatric: Mood and affect are normal. Speech and behavior are normal.    ____________________________________________   DIFFERENTIAL includes but not limited to  Glaucoma, lens dislocation, farsightedness, pseudotumor cerebri ____________________________________________   LABS (all labs ordered are listed, but only abnormal results are displayed)  Labs Reviewed - No data to display   __________________________________________  EKG   ____________________________________________  RADIOLOGY  Head CT reviewed by me with no acute disease ____________________________________________   PROCEDURES  Procedure(s) performed: no  Procedures  Critical Care performed: no  Observation: no ____________________________________________   INITIAL IMPRESSION / ASSESSMENT AND PLAN / ED COURSE  Pertinent labs & imaging results that were available during my care of the patient were reviewed by me and considered in my medical  decision making (see chart for details).  The patient arrives with monocular diplopia of both eyes.  Head CT obtained given the new symptoms and is fortunately reassuring.  At this point with a normal neuro exam and monocular diplopia I have encouraged her to follow-up with optometry for possible glasses.  She verbalized understanding agreement plan with strict return precautions.      ____________________________________________   FINAL CLINICAL IMPRESSION(S) / ED DIAGNOSES  Final diagnoses:  Monocular diplopia of both eyes      NEW MEDICATIONS STARTED DURING THIS VISIT:  Discharge Medication List as of 12/07/2017  3:10 AM       Note:  This document was prepared using Dragon voice recognition software and may include unintentional dictation errors.     Darel Hong, MD 12/10/17 [phone]

## 2017-12-08 ENCOUNTER — Encounter: Payer: Self-pay | Admitting: Family Medicine

## 2017-12-08 ENCOUNTER — Telehealth: Payer: Self-pay | Admitting: Family Medicine

## 2017-12-08 ENCOUNTER — Ambulatory Visit (INDEPENDENT_AMBULATORY_CARE_PROVIDER_SITE_OTHER): Payer: Medicaid Other | Admitting: Family Medicine

## 2017-12-08 VITALS — BP 130/72 | HR 66 | Temp 97.7°F | Resp 16

## 2017-12-08 DIAGNOSIS — R600 Localized edema: Secondary | ICD-10-CM

## 2017-12-08 DIAGNOSIS — G4733 Obstructive sleep apnea (adult) (pediatric): Secondary | ICD-10-CM

## 2017-12-08 DIAGNOSIS — Z993 Dependence on wheelchair: Secondary | ICD-10-CM

## 2017-12-08 DIAGNOSIS — H532 Diplopia: Secondary | ICD-10-CM | POA: Diagnosis not present

## 2017-12-08 DIAGNOSIS — G5601 Carpal tunnel syndrome, right upper limb: Secondary | ICD-10-CM

## 2017-12-08 DIAGNOSIS — Z7901 Long term (current) use of anticoagulants: Secondary | ICD-10-CM | POA: Diagnosis not present

## 2017-12-08 DIAGNOSIS — G5600 Carpal tunnel syndrome, unspecified upper limb: Secondary | ICD-10-CM | POA: Insufficient documentation

## 2017-12-08 DIAGNOSIS — I2699 Other pulmonary embolism without acute cor pulmonale: Secondary | ICD-10-CM | POA: Diagnosis not present

## 2017-12-08 LAB — POCT INR
INR: 2.9
PT: 34.6

## 2017-12-08 MED ORDER — NYSTATIN 100000 UNIT/GM EX CREA: 1.0000 "application " | TOPICAL_CREAM | Freq: Two times a day (BID) | CUTANEOUS | 3 refills | Status: DC | PRN

## 2017-12-08 NOTE — Telephone Encounter (Signed)
Referral to phsyical therapy for wheelchair eval and completion of required testing placed.  This will help determine her exact needs.   It is likely that after PT eval completed, she will need a face-to-face visit just for ordering wheelchair.  Virginia Crews, MD, MPH [facility] 12/08/2017 4:06 PM

## 2017-12-08 NOTE — Patient Instructions (Addendum)
Try compression stockings daily (at least 3mm) - place in the mornings and take off at bedtime  Leg elevation is important  Someone will call about an appointment with the Ophthalmologist  Try cock-up wrist splints while sleeping  Diplopia Diplopia is the condition of having double vision or seeing two of a single object. There are many causes of diplopia. Some are not dangerous and can be easily corrected. Diplopia may also be a symptom of a serious medical problem. There are two types of diplopia.  Monocular diplopia. This is double vision that affects only one eye. Monocular diplopia is often caused by a clouding of the lens in your eye (cataract) or by disruptions in the way that your eye focuses light.  Binocular diplopia. This is double vision that affects both eyes. However, when you shut one eye, the double vision will go away. Binocular diplopia may be more serious. It can be caused by: ? Problems with the nerves or muscles that are responsible for eye movement. ? Neurologic diseases. ? Thyroid problems. ? Tumors. ? An infection near your eyes. ? A stroke.  You may need to see a health care provider who specializes in eye conditions (ophthalmologist) or a nerve specialist (neurologist) to find the cause. Follow these instructions at home:  Tell your health care provider about any changes in your vision.  Do not drive or operate heavy machinery if diplopia interferes with your vision.  Keep all follow-up visits as directed by your health care provider. This is important. Contact a health care provider if:  Your diplopia gets worse.  You develop any other symptoms along with your diplopia, such as: ? Weakness. ? Numbness. ? Headache. ? Eye pain. ? Clumsiness. ? Nausea. ? Drooping eyelids. ? Abnormal movement of one of your eyes. Get help right away if:  You have sudden vision loss.  You suddenly get a very bad headache.  You have sudden weakness or  numbness.  You suddenly lose the ability to speak, understand speech, or both. This information is not intended to replace advice given to you by your health care provider. Make sure you discuss any questions you have with your health care provider. Document Released: 05/19/2004 Document Revised: 12/24/2015 Document Reviewed: 06/11/2014 Elsevier Interactive Patient Education  [employer].

## 2017-12-08 NOTE — Telephone Encounter (Signed)
Pt would like to know if provider could put in a order or prescription for a new wheelchair. Pt states she has to use duck tape on her wheelchair to hold it together in places. Please call patient @ [phone]  Thanks CC

## 2017-12-08 NOTE — Telephone Encounter (Signed)
Please review

## 2017-12-08 NOTE — Assessment & Plan Note (Signed)
New problem Exam benign CT head in ED 2 days ago with CVA Symptoms are improving Referral to Optho - also needs DM eye exam

## 2017-12-08 NOTE — Assessment & Plan Note (Signed)
On lifelong coumadin See plan above for coumadin dosing

## 2017-12-08 NOTE — Assessment & Plan Note (Signed)
New problem Likely 2/2 morbid obesity and sitting with legs hanging down all day Discussed compression socks and elevation

## 2017-12-08 NOTE — Progress Notes (Signed)
Patient: Stacey [name] Female    DOB: [birth year]   57 y.o.   MRN: [id number] Visit Date: 12/08/2017  Kathleen Lime Ratchford, CMA, am acting as scribe for Lavon Paganini, MD.  [phone] Provider: Lavon Paganini, MD   Chief Complaint  Patient presents with  . ER Follow Up  . Anticoagulation  . Fatigue   Subjective:    HPI     Follow up ER visit  Patient was seen in ER for diplopia on 12/07/2017. She was treated for diplopia. Treatment for this included ordering imaging, which was normal. She reports this condition is Unchanged.  Problem started suddenly that night while driving.  Occurred with either eye.  Was seeing identical signs side by side.  Hasn't driven since that night.  Now with remaining blurry vision but no diplopia.  Now thinking back on it this happened once before about 1-2 weeks. ------------------------------------------------------------------------------------  Pt also needs PT/INR rechecked.  Currently taking Coumadin 10mg  daily, except 5 mg on Tuesdays and Fridays.  She states she stays "abnormally fatigued", and would like her iron checked. She states she has a H/O anemia.  Reports that she just started using CPAP again.  Had not been using for a few years prior to this.  Pt needs a refill of Nystatin cream. She uses this for heat rash.  Bilateral hand and thumb pain R>L.  Worse with holding objects.  Ongoing for 1 year. Getting worse.  Used to go away after shaking it out.  Allergies  Allergen Reactions  . Hydrocodone Anaphylaxis  . Nitroglycerin Anxiety  . Tramadol Anaphylaxis and Swelling  . Hydromorphone Other (See Comments)    Dilaudid. "hallucinations" has tolerated morphine, but "it doesn't work very well"  . Levaquin [Levofloxacin In D5w] Nausea And Vomiting  . Toradol [Ketorolac Tromethamine] Swelling  . Levofloxacin Other (See Comments)  . Sulfa Antibiotics Rash  . Tape Rash  . Trazodone And Nefazodone Other (See Comments)      Current Outpatient Medications:  .  albuterol (PROVENTIL HFA;VENTOLIN HFA) 108 (90 Base) MCG/ACT inhaler, Inhale 2 puffs into the lungs every 6 (six) hours as needed for wheezing or shortness of breath., Disp: 1 Inhaler, Rfl: 0 .  buPROPion (WELLBUTRIN XL) 300 MG 24 hr tablet, Take 300 mg by mouth daily. , Disp: , Rfl:  .  busPIRone (BUSPAR) 15 MG tablet, Take 2 tablets (30 mg total) by mouth 2 (two) times daily., Disp: 120 tablet, Rfl: 1 .  COUMADIN 10 MG tablet, TAKE 1 TABLET BY MOUTH ONCE A DAY, Disp: 30 tablet, Rfl: 3 .  escitalopram (LEXAPRO) 20 MG tablet, Take 20 mg by mouth daily. , Disp: , Rfl:  .  lisinopril (PRINIVIL,ZESTRIL) 5 MG tablet, Take 1 tablet (5 mg total) by mouth daily., Disp: 90 tablet, Rfl: 3 .  LORazepam (ATIVAN) 2 MG tablet, Take 1 tablet (2 mg total) by mouth 3 (three) times daily as needed for anxiety. (Patient taking differently: Take 1 mg by mouth 2 (two) times daily. ), Disp: 90 tablet, Rfl: 0 .  nystatin cream (MYCOSTATIN), Apply 1 application topically 2 (two) times daily as needed (candidal rash)., Disp: 30 g, Rfl: 3 .  warfarin (COUMADIN) 10 MG tablet, Take 10mg  daily x5days per week and 5mg  daily 2 days weekly, Disp: 30 tablet, Rfl: 3 .  fluticasone (FLONASE) 50 MCG/ACT nasal spray, Place 2 sprays into both nostrils daily. (Patient not taking: Reported on 11/08/2017), Disp: 16 g, Rfl: 6  Review of  Systems  Constitutional: Positive for appetite change (increased) and fatigue. Negative for activity change, chills, diaphoresis, fever and unexpected weight change.  Eyes: Positive for visual disturbance.  Respiratory: Positive for shortness of breath (pt uses albuterol inhaler for this).   Cardiovascular: Positive for leg swelling. Negative for chest pain and palpitations.    Social History   Tobacco Use  . Smoking status: Former Smoker    Years: 2.00    Types: Cigarettes    Last attempt to quit: 2003    Years since quitting: 16.3  . Smokeless tobacco:  Never Used  . Tobacco comment: former light cigarette smoker  Substance Use Topics  . Alcohol use: No   Objective:   BP 130/72 (BP Location: Left Wrist, Patient Position: Sitting, Cuff Size: Large)   Pulse 66   Temp 97.7 F (36.5 C) (Oral)   Resp 16   LMP 04/24/2017 Comment: Patient states she has a period when Coumadin level drops.  SpO2 99%  Vitals:   12/08/17 1332  BP: 130/72  Pulse: 66  Resp: 16  Temp: 97.7 F (36.5 C)  TempSrc: Oral  SpO2: 99%     Physical Exam  Constitutional: She is oriented to person, place, and time. She appears well-developed and well-nourished. No distress.  Sitting in wheelchair  HENT:  Head: Normocephalic and atraumatic.  Right Ear: External ear normal.  Left Ear: External ear normal.  Mouth/Throat: Oropharynx is clear and moist.  Eyes: Pupils are equal, round, and reactive to light. Conjunctivae and EOM are normal. Right eye exhibits no discharge. Left eye exhibits no discharge. No scleral icterus.  Neck: Neck supple. No thyromegaly present.  Cardiovascular: Normal rate, regular rhythm, normal heart sounds and intact distal pulses.  No murmur heard. Pulmonary/Chest: Effort normal and breath sounds normal. No respiratory distress. She has no wheezes. She has no rales.  Abdominal: Soft. She exhibits no distension. There is no tenderness.  Musculoskeletal: She exhibits edema (1+ bilaterally to knees).  R wrist: +Tinels, +Phalens. ROM intact, no point TTP. L wrist: Neg Tinels and Phalens. ROM intact. No TTP.  Lymphadenopathy:    She has no cervical adenopathy.  Neurological: She is alert and oriented to person, place, and time.  Skin: Skin is warm and dry. Capillary refill takes less than 2 seconds. No rash noted.  Psychiatric: She has a normal mood and affect. Her behavior is normal.  Vitals reviewed.      Assessment & Plan:   Problem List Items Addressed This Visit      Cardiovascular and Mediastinum   Pulmonary embolism    H/o PE  and DVT several years ago She was told that due to her sedentary lifestyle and high risk of recurrent VTE she will be on lifelong Coumadin therapy INR therapeutic Continue current regimen of 10mg  daily except 5mg  on Tue/Fri F/u INR in 1 month        Respiratory   OSA (obstructive sleep apnea) (Chronic)    Worsening fatigue that is liekyl multifactorial Reassured patient about normal CBC in 09/2017 She has not been using CPAP for many years, so discussed that it may take a while for this to help her fatigue Could consider refitting mask in the future if this is not helping        Nervous and Auditory   Carpal tunnel syndrome    New diagnosis Patient with symptoms c/w carpal tunnel and with Positive testing Trial of wrist splinting at night May need referral to Ortho in the  future        Other   Long term (current) use of anticoagulants    On lifelong coumadin See plan above for coumadin dosing      Diplopia - Primary    New problem Exam benign CT head in ED 2 days ago with CVA Symptoms are improving Referral to Optho - also needs DM eye exam      Relevant Orders   Ambulatory referral to Ophthalmology   Lower extremity edema    New problem Likely 2/2 morbid obesity and sitting with legs hanging down all day Discussed compression socks and elevation          Return in about 1 month (around 01/05/2018) for Diabetes, INR check. Should also do HTN f/u at that time  The entirety of the information documented in the History of Present Illness, Review of Systems and Physical Exam were personally obtained by me. Portions of this information were initially documented by Raquel Sarna Ratchford, CMA and reviewed by me for thoroughness and accuracy.    Virginia Crews, MD, MPH [facility] 12/08/2017 3:16 PM

## 2017-12-08 NOTE — Assessment & Plan Note (Signed)
Worsening fatigue that is liekyl multifactorial Reassured patient about normal CBC in 09/2017 She has not been using CPAP for many years, so discussed that it may take a while for this to help her fatigue Could consider refitting mask in the future if this is not helping

## 2017-12-08 NOTE — Assessment & Plan Note (Signed)
New diagnosis Patient with symptoms c/w carpal tunnel and with Positive testing Trial of wrist splinting at night May need referral to Ortho in the future

## 2017-12-08 NOTE — Assessment & Plan Note (Signed)
H/o PE and DVT several years ago She was told that due to her sedentary lifestyle and high risk of recurrent VTE she will be on lifelong Coumadin therapy INR therapeutic Continue current regimen of 10mg  daily except 5mg  on Tue/Fri F/u INR in 1 month

## 2017-12-13 ENCOUNTER — Other Ambulatory Visit: Payer: Self-pay | Admitting: Family Medicine

## 2017-12-13 DIAGNOSIS — Z4689 Encounter for fitting and adjustment of other specified devices: Secondary | ICD-10-CM

## 2017-12-14 ENCOUNTER — Telehealth: Payer: Self-pay | Admitting: Family Medicine

## 2017-12-14 NOTE — Telephone Encounter (Signed)
pt came in last week and seen Dr, B.  She said Dr. B wants to send her to Physical therapy but pt is reluctant to go.  She says she just needs an order for a new wheel chair that hers is falling apart.  She ask if Simona Huh or Dr, B could please give her an order for anew chair or make arrangements for her to get one.  Pt's call back is [phone]

## 2017-12-15 NOTE — Telephone Encounter (Signed)
Please review

## 2017-12-18 NOTE — Telephone Encounter (Signed)
That is what the PT is for.  She is not going to get therapy from this referral.  They are just going to measure and fit her for a wheelchair.  It is not as simple as ordering a wheelchair.  There are several different types and we need to know what size fits her best.  Virginia Crews, MD, MPH [facility] 12/18/2017 11:02 AM

## 2017-12-18 NOTE — Telephone Encounter (Signed)
Left message advising pt as below.

## 2017-12-19 ENCOUNTER — Telehealth: Payer: Self-pay | Admitting: Family Medicine

## 2017-12-19 NOTE — Telephone Encounter (Signed)
Pt stated that she contacted [facility] about her wheelchair eval and they aren't going to be able to get pt in until June or July. Pt is requesting to be referred to somewhere that can see her ASAP because her wheelchair is falling apart. Please advise. Thanks TNP

## 2017-12-19 NOTE — Telephone Encounter (Signed)
Please review

## 2017-12-20 NOTE — Telephone Encounter (Signed)
She needs to see Rehab for the wheelchair eval.  June is only a few weeks away.  She is welcome to call other rehab places and see if she could be seen any sooner.  Virginia Crews, MD, MPH [facility] 12/20/2017 12:19 PM

## 2017-12-21 NOTE — Telephone Encounter (Signed)
Left message advising pt. 

## 2018-01-04 LAB — HM DIABETES EYE EXAM

## 2018-01-05 ENCOUNTER — Ambulatory Visit: Payer: Self-pay | Admitting: Family Medicine

## 2018-01-06 ENCOUNTER — Encounter: Payer: Self-pay | Admitting: Family Medicine

## 2018-01-08 ENCOUNTER — Ambulatory Visit: Payer: Medicaid Other | Attending: Family Medicine | Admitting: Physical Therapy

## 2018-01-08 DIAGNOSIS — M6281 Muscle weakness (generalized): Secondary | ICD-10-CM | POA: Diagnosis present

## 2018-01-08 NOTE — Therapy (Signed)
[place] MAIN [facility] SERVICES [address] [place], [place], [address] Phone: [phone]   Fax:  [phone]  Physical Therapy Evaluation  Patient Details  Name: Stacey [name] MRN: [id number] Date of Birth: June 11, 1961 No data recorded  Encounter Date: 01/08/2018    Past Medical History:  Diagnosis Date  . Anxiety   . Arthritis   . Asthma   . 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 C. difficile colitis 11/08/2011  . Cyst    pt has "cyst" to legs for years, areas drain at time  . Depression   . Diverticulitis 2016  . DVT (deep venous thrombosis) ([place]) 2014  . Hypertension   . Incisional hernia    in C-section incision  . Musculoskeletal pain 11/08/2011  . Neuropathic pain 11/10/2011  . Pancreatitis 1991  . Pulmonary embolism ([place]) 2014  . Sleep apnea with use of continuous positive airway pressure (CPAP)    uses CPAP at night  . Type 2 diabetes mellitus ([place])     Past Surgical History:  Procedure Laterality Date  . CESAREAN SECTION     x2  . CHOLECYSTECTOMY  1991   laproscopic  . NOSE SURGERY  1982  . SKIN BIOPSY    . TONSILLECTOMY  1980    There were no vitals filed for this visit.   Subjective Assessment - 01/08/18 0809    Subjective  Patient is wanting a new wc and she has a manual wc now.    Pertinent History  Patient can pivot from wc to bedside commode. She is unable to walk and cant stand up straight. She weighs 415 lbs.    Limitations  Standing;Walking       PATIENT INFORMATION: This Evaluation form will serve as the LMN for the following suppliers:  Supplier: [employer] Contact Person: Deberah Pelton  Patient/caregiver Goals: to get around her house Patient was seen for face-to-face evaluation for new manual wheelchair.  Also present was   Deberah Pelton                    to discuss recommendations and wheelchair options.  Further paperwork was completed and sent to vendor.  Patient  appears to qualify for manual mobility device at this time per objective findings.   MEDICAL HISTORY: Diagnosis: DDD , arthritis Primary Diagnosis Onset:2009 _0 Progressive Disease Relevant Past and Future Surgeries: Patient has had 2 c -sections, surgery R knee, Height: 5 foot 6 inches Weight: 415 lbs  Explain and recent changes or trends in weight:  No changes Relevant History including falls: no falls     HOME ENVIRONMENT: _1 House  _2 Condo/town home  _3 Apartment  _4 Assisted Living    _5 Lives Alone _6  Lives with Others                                                    Hours with caregiver: 4 hours / day 7 days / week  _7 Home is accessible to patient            Stairs  _8 Yes _9  No     Ramp _10 Yes _11 No Comments:     COMMUNITY ADL: TRANSPORTATION: _12 Car    _13 Van    <HQPRFFMBWGYKZLDJ>_5<\/TSVXBLTJQZESPQZR>_00 Public Transportation    _15 Adapted w/c Lift   _16 Ambulance   _17 Other:       _18   Sits in wheelchair during transport  Employment/School:     Specific requirements pertaining to mobility                                                     Other:                                       FUNCTIONAL/SENSORY PROCESSING SKILLS:  Handedness:   _0 Right     _1 Left    _2 NA  Comments:                                 Functional Processing Skills for Wheeled Mobility _3 Processing Skills are adequate for safe wheelchair operation  Areas of concern than may interfere with safe operation of wheelchair Description of problem   _4  Attention to environment     _5 Judgment     _6  Hearing  _7  Vision or visual processing    _8 Motor Planning  _9  Fluctuations in Behavior                                                   VERBAL COMMUNICATION: _10 WFL receptive _11  WFL expressive _12 Understandable  _13 Difficult to understand  _14 non-communicative _15  Uses an augmented communication device    CURRENT SEATING / MOBILITY: Current Mobility Base:   _16 None  _17 Dependent  _18 Manual  _19 Scooter  _20 Power   Type of Control:                        Manufacturer:     Medline K 4                     Size:    24 x 20                    Age:        57 years old                   Current Condition of Mobility Base:    Not weighted for over 350  Lbs, seat is worn out, ripped                                                                                                               Current Wheelchair components:  Describe posture in present seating system:                                                                            SENSATION and SKIN ISSUES: Sensation _0 Intact _1 Impaired _2 Absent   Level of sensation:  Numbness and tingling in B feet                         Pressure Relief: Able to perform effective pressure relief :   _3 Yes  _4  No Method:     Stands up briefly                                                                         If not, Why?:                                                                          Skin Issues/Skin Integrity Current Skin Issues   _5 Yes _6 No  _7 Intact _8  Red area _9  Open Area  _10 Scar Tissue _11 At risk from prolonged sitting  Where                              History of Skin Issues   _12 Yes _13 No  Where   bottom                                      When      All the time                                         Hx of skin flap surgeries _14 Yes _15 No  Where                  When                                                  Limited sitting tolerance _16 Yes _17 No Hours spent sitting in wheelchair daily:                           10-12 hours                              Complaint of Pain:  Please describe:                                                                            Chronic back pain, right hip pain , B knee pain constant                                 Swelling/Edema:                                                                                                                    B    feet                           ADL STATUS (in reference to wheelchair use):  Indep Assist Unable Indep with Equip Not assessed Comments  Dressing              x                                                           Eating       x                                                                                                                       Toileting               x  Bathing                x                                                                                                                      Grooming/ Hygiene              x                                                                                                                Meal Prep                 x                                                                                                         IADLS                 x                                                                                                 Bowel Management: _0 Continent  _1 Incontinent  _2 Accidents Comments:       .Depends on how fast she can get to the  Bedside commode                                          Bladder Management: _3 Continent  _4 Incontinent  _5 Accidents Comments:         Depends on how fast she can make it to the bedside commode  WHEELCHAIR SKILLS: Manual w/c Propulsion: _0 UE or LE strength and endurance sufficient to participate in ADLs using manual wheelchair Arm :  _1 left _2 right  _3 Both                                   Foot:   _4 left _5 right  _6 Both  Distance:   Operate Scooter: _7  Strength, hand grip, balance and transfer appropriate for use _8 Living environment is accessible for use of scooter  Operate Power w/c:  _9  Std. Joystick   _10  Alternative Controls Indep _11  Assist _12   Dependent/ Unable _13  N/A _14  _15 Safe          _16  Functional      Distance:                Bed confined without wheelchair _17  Yes _18  No   STRENGTH/RANGE OF MOTION:  Range of Motion Strength  Shoulder        RUE WFL  LUE 90 degs flex and abd                                              RUE 3/5 LUE -3/5                                                        Elbow    WFL                                                    WFL                                                           Wrist/Hand            WFL                                                                                                                 WFL                  Hip       Limited Bilaterally due to patients large girth  poor  Knee          WFL                                                           3/5                                                         Ankle WFL                                                             3/5          MOBILITY/BALANCE:  _0  Patient is totally dependent for mobility                                                                                               Balance Transfers Ambulation  Sitting Balance: Standing Balance: _1  Independent _2  Independent/Modified Independent  _3  WFL     _4  WFL _5  Supervision _6  Supervision  _7  Uses UE for balance  _8  Supervision _9  Min Assist _10  Ambulates with Assist                           _11  Min Assist _12  Min assist _13  Mod Assist _14  Ambulates with Device:  _15  RW   _16  StW   _17  Cane   _18                 _19  Mod Assist _20  Mod assist _21  Max assist   _22  Max Assist _23  Max assist _24  Dependent _25  Indep. Short Distance Only  _26  Unable _27  Unable _28  Lift / Sling Required Distance (in feet)                             _29  Sliding board _30  Unable to Ambulate: (Explain:unable to stand up straight , unable to take any steps  Cardio  Status:  _31 Intact  _32  Impaired   _33  NA                              Respiratory Status:  _34 Intact   _35 Impaired   _36 NA                                     Orthotics/Prosthetics:  NA  Comments (Address manual vs power w/c vs scooter):                                                              Anterior / Posterior Obliquity Rotation-Pelvis  PELVIS    _0 Neutral  _1  Posterior  _2  Anterior     _3 WFL  _4 Right Elevated  _5 Left Elevated   _6 WFL  _7 Right Anterior _8   Left Anterior    _9  Fixed _10  Partly Flexible _11  Flexible  _12  Other  _13  Fixed  _14  Partly Flexible  _15  Flexible _16  Other  _17  Fixed  _18  Partly Flexible  _19  Flexible _20  Other  TRUNK _21 WFL _22 Thoracic Kyphosis _23 Lumbar Lordosis   _24  WFL _25 Convex Right _26 Convex Left   _27 c-curve _28 s-curve _29 multiple  _30  Neutral _31  Left-anterior _32  Right-anterior    _33  Fixed _34  Flexible _35  Partly Flexible       Other  _36  Fixed _37  Flexible _38  Partly Flexible _39  Other  _40  Fixed           _41  Flexible _42  Partly Flexible _43  Other   Position Windswept   HIPS  _44  Neutral _45  Abduct _46  ADduct _47  Neutral _48  Right _49  Left       _50  Fixed  _51  Partly Flexible             _52  Dislocated _53  Flexible _54  Subluxed    _55  Fixed _56  Partly Flexible  _57  Flexible _58  Other              Foot Positioning Knee Positioning   Knees and  Feet  _59  WFL _60 Left _61 Right _62  WFL _63 Left _64 Right   KNEES ROM concerns: ROM concerns:   & Dorsi-Flexed                    _65 Lt _66 Rt                                  FEET Plantar Flexed                  _67 Lt _68 Rt     Inversion                    _69 Lt _70 Rt     Eversion                    _71 Lt _72 Rt    HEAD _73  Functional _74  Good Head Control   & _75  Flexed         _76  Extended _77  Adequate Head Control   NECK _78  Rotated  Lt  _79  Lat Flexed Lt _80  Rotated  Rt _81  Lat Flexed Rt _82  Limited Head Control     _83  Cervical Hyperextension _84  Absent  Head Control    SHOULDERS ELBOWS WRIST& HAND         Left     Right    Left     Right  U/E _85 Functional  Left            _86 Functional  Right                                 _87 Fisting             _88   Fisting     _0 elevated Left _1 depressed  Left _2 elevated Right _3 depressed  Right      _4 protracted Left _5 retracted Left _6 protracted Right _7 retracted Right _8 subluxed  Left              _9 subluxed  Right         Goals for Wheelchair Mobility  _10  Independence with mobility in the home with motor related ADLs (MRADLs)  _11  Independence with MRADLs in the community _12  Provide dependent mobility  _13  Provide recline     _14 Provide tilt   Goals for Seating system _15  Optimize pressure distribution _16  Provide support needed to facilitate function or safety _17  Provide corrective forces to assist with maintaining or improving posture _18  Accommodate client's posture: current seated postures and positions are not flexible or will not tolerate corrective forces _19  Client to be independent with relieving pressure in the wheelchair _20 Enhance physiological function such as breathing, swallowing, digestion  Simulation ideas/Equipment trials:                                                                                                State why other equipment was unsuccessful:                                                                                 MOBILITY BASE RECOMMENDATIONS and JUSTIFICATION: MOBILITY COMPONENT JUSTIFICATION  Manufacturer:           Model:     sunrise        M 6  Size: Width    24       Seat Depth     20        _21 provide transport from point A to B _22 promote Indep mobility  _23 is not a safe, functional ambulator _24 walker or cane inadequate _25 non-standard width/depth necessary to accommodate anatomical measurement _26                             _27 Manual Mobility Base _28 non-functional ambulator    _29 Scooter/POV   _30 can safely operate  _31 can safely transfer   _32 has adequate trunk stability  _33 cannot functionally propel manual w/c  _34 Power Mobility Base  _35 non-ambulatory  _36 cannot functionally propel manual wheelchair  _37  cannot functionally and safely operate scooter/POV _38 can safely operate and willing to  _39 Stroller Base _40 infant/child  _41 unable to propel manual wheelchair _42 allows for growth _43 non-functional ambulator _44 non-functional UE _45 Indep mobility is not a goal at this time  _46 Tilt  _47 Forward                   _48 Backward                  _49 Powered tilt              _50   Manual tilt  _0 change position against gravitational force on head and shoulders  _1 change position for pressure relief/cannot weight shift _2 transfers  _3 management of tone _4 rest periods _5 control edema _6 facilitate postural control  _7                                       _8 Recline  _9 Power recline on power base _10 Manual recline on manual base  _11 accommodate femur to back angle  _12 bring to full recline for ADL care  _13 change position for pressure relief/cannot weight shift _14 rest periods _15 repositioning for transfers or clothing/diaper /catheter changes _16 head positioning  _17 Lighter weight required _18 self- propulsion  _19 lifting _20                                                 _21 Heavy Duty required _22 user weight greater than 250# _23 extreme tone/ over active movement _24 broken frame on previous chair _25                                     _26  Back  _27  Angle Adjustable _28  Custom molded                           _29 postural control _30 control of tone/spasticity _31 accommodation of range of motion _32 UE functional control _33 accommodation for seating system _34                                          _35 provide lateral trunk support _36 accommodate deformity _37 provide posterior trunk support _38 provide lumbar/sacral support _39 support trunk in midline _40 Pressure relief over spinal processes  _41  Seat Cushion      J2  plus 2420                 _42 impaired sensation  _43 decubitus ulcers present _44 history of pressure ulceration _45 prevent pelvic extension _46 low maintenance Hx of skin issues, decreased standing tolerance: seat height hemi height due to patients need to use her feet for mobility _47 stabilize pelvis  _48 accommodate obliquity _49 accommodate multiple deformity _50 neutralize lower extremity position _51 increase pressure distribution _52                                           _53  Pelvic/thigh support  _54  Lateral thigh guide _55  Distal medial pad  _56  Distal lateral pad _57  pelvis in neutral _58 accommodate pelvis _59  position upper legs _60  alignment _61  accommodate ROM _62  decrease adduction _63 accommodate tone _64 removable for transfers _65 decrease abduction  _66  Lateral trunk Supports _67  Lt     _68  Rt _69 decrease lateral trunk leaning _70 control tone _71 contour for increased contact _72 safety  _73 accommodate asymmetry _74                                                 _75  Mounting hardware  _76 lateral trunk supports  _77 back   _78 seat _79 headrest      _80  thigh  support _0 fixed   _1 swing away _2 attach seat platform/cushion to w/c frame _3 attach back cushion to w/c frame _4 mount postural supports _5 mount headrest  _6 swing medial thigh support away _7 swing lateral supports away for transfers  _8                                                     Armrests  _9 fixed _10 adjustable height _11 removable   _12 swing away  _13 flip back   _14 reclining _15 full length pads _16 desk    _17 pads tubular  _18 provide support with elbow at 90   _19 provide support for w/c tray _20 change of height/angles for variable activities _21 remove for transfers _22 allow to come closer to table top _23 remove for access to tables _24                                               Hangers/ Leg rests  _25 60 _26 70 _27 90 _28 elevating _29 heavy duty  _30 articulating _31 fixed _32 lift off _33 swing away     _34 power _35 provide LE support  _36 accommodate to  hamstring tightness _37 elevate legs during recline   _38 provide change in position for Legs _39 Maintain placement of feet on footplate _40 durability _41 enable transfers _42 decrease edema _43 Accommodate lower leg length _44                                         Foot support Footplate    <TOIZTIWPYKDXIPJA>_2<\/NKNLZJQBHALPFXTK>_24 Lt  _46  Rt  _47  Center mount _48 flip up                            _49 depth/angle adjustable _50 Amputee adapter    _51  Lt     _52  Rt _53 provide foot support _54 accommodate to ankle ROM _55 transfers _56 Provide support for residual extremity _57  allow foot to go under wheelchair base _58  decrease tone  _59                                                 _60  Ankle strap/heel loops _61 support foot on foot support _62 decrease extraneous movement _63 provide input to heel  _64 protect foot  Tires: _65 pneumatic  _66 flat free inserts  _67 solid  _68 decrease maintenance  _69 prevent frequent flats _70 increase shock absorbency Due to low maintainence _71 decrease pain from road shock _72 decrease spasms from road shock _73                                              _74  Headrest  _75 provide posterior head support _76 provide posterior neck support _77 provide lateral head support _78 provide anterior head support _79 support during tilt and recline _80 improve feeding   _81 improve respiration _82 placement of switches _83 safety  _84 accommodate ROM  _85 accommodate tone _86 improve visual orientation  _87  Anterior chest strap _88  Vest _89  Shoulder retractors  _90 decrease forward movement of shoulder _91 accommodation of TLSO _92 decrease forward movement of trunk _93 decrease shoulder elevation _94 added abdominal support _95 alignment _96 assistance with shoulder control  _97   Pelvic Positioner _0 Belt _1 SubASIS bar _2 Dual Pull _3 stabilize tone _4 decrease falling out of chair/ **will not Decrease potential for sliding due to pelvic tilting _5 prevent excessive rotation _6 pad for protection over boney  prominence _7 prominence comfort _8 special pull angle to control rotation _9                                                  Upper ExtremitySupport  _10 L   _11  R _12 Arm trough   _13 hand support _14  tray       _15 full tray _16 swivel mount _17 decrease edema      _18 decrease subluxation   _19 control tone   _20 placement for AAC/Computer/EADL _21 decrease gravitational pull on shoulders _22 provide midline positioning _23 provide support to increase UE function _24 provide hand support in natural position _25 provide work surface   POWER WHEELCHAIR CONTROLS  _26 Proportional  _27 Non-Proportional Type                                      _28 Left  _29 Right _30 provides access for controlling wheelchair   _31 lacks motor control to operate proportional drive control <PZWCHENIDPOEUMPN>_3<\/IRWERXVQMGQQPYPP>_50 unable to understand proportional controls  Actuator Control Module  _33 Single  _34 Multiple   _35 Allow the client to operate the power seat function(s) through the joystick control   _36 Safety Reset Switches _37 Used to change modes and stop the wheelchair when driving in latch mode    _38 [employer]   _39 programming for accurate control _40 progressive Disease/changing condition _41 non-proportional drive control needed _42 Needed in order to operate power seat functions through joystick control   _43 Display box _44 Allows user to see in which mode and drive the wheelchair is set  _45 necessary for alternate controls    _46 Digital interface electronics _47 Allows w/c to operate when using alternative drive controls  <DTOIZTIWPYKDXIPJ>_8<\/SNKNLZJQBHALPFXT>_02 ASL Head Array _49 Allows client to operate wheelchair  through switches placed in tri-panel headrest  _50 Sip and puff with tubing kit _51 needed to operate sip and puff drive controls  <IOXBDZHGDJMEQAST>_4<\/HDQQIWLNLGXQJJHE>_17 Upgraded tracking electronics _53 increase safety when driving <EYCXKGYJEHUDJSHF>_0<\/YOVZCHYIFOYDXAJO>_87 correct tracking when on uneven surfaces  _55 [facility] for switches or joystick _56 Attaches switches to w/c  _57 Swing away for access or transfers _58 midline for optimal placement _59 provides for consistent access   _60 Attendant controlled joystick plus mount _61 safety _62 long distance driving <OMVEHMCNOBSJGGEZ>_6<\/OQHUTMLYYTKPTWSF>_68 operation of seat functions _64 compliance with transportation regulations _65                                             Rear wheel placement/Axle adjustability _66 None _67 semi adjustable _68 fully adjustable  _69 improved UE access to wheels _70 improved stability _71 changing angle in space for improvement of postural stability _72 1-arm drive access <LEXNTZGYFVCBSWHQ>_7<\/RFFMBWGYKZLDJTTS>_17 amputee pad placement _74                                Wheel rims/ hand rims  _75 metal   _76 plastic coated _77 oblique projections           _78 vertical projections _79 Provide ability to propel manual wheelchair  _80  Increase self-propulsion with hand weakness/decreased grasp  Push handles _81 extended   _82 angle adjustable              _83 standard _84 caregiver access _85 caregiver assist _86 allows "hooking" to enable increased  ability to perform ADLs or maintain balance  One armed device   _0 Lt   _1 Rt _2 enable propulsion of manual wheelchair with one arm   _3                                            Brake/wheel lock extension _4  Lt   _5  Rt _6 increase indep in applying wheel locks   _7 Side guards _8 prevent clothing getting caught in wheel or becoming soiled _9  prevent skin tears/abrasions  Battery:                                            _10 to power wheelchair                                                         Other:                                                                                                                        The above equipment has a life- long use expectancy. Growth and changes in medical and/or functional conditions would be the exceptions. This is to certify that the therapist has no financial relationship with durable medical provider or manufacturer. The therapist will not receive remuneration of any kind for the equipment recommended in this evaluation.   Patient has mobility limitation that significantly impairs safe, timely participation in  one or more mobility related ADL's. (bathing, toileting, feeding, dressing, grooming, moving from room to room)  _11  Yes _12  No  Will mobility device sufficiently improve ability to participate and/or be aided in participation of MRADL's?      _13  Yes _14  No  Can limitation be compensated for with use of a cane or walker?                                    _15  Yes _16  No  Does patient or caregiver demonstrate ability/potential ability & willingness to safely use the mobility device?    _17  Yes _18  No  Does patient's home environment support use of recommended mobility device?            _19  Yes _20  No  Does patient have sufficient upper extremity function necessary to functionally propel a manual wheelchair?     _21  Yes _22  No  Does patient have sufficient strength and trunk stability to safely operate a POV (scooter)?                                  _23   Yes _0  No  Does patient need additional features/benefits provided by a power wheelchair for MRADL's in the home?        _1  Yes _2  No  Does the patient demonstrate the ability to safely use a power wheelchair?                   _3  Yes _4  No     Physician's Name Printed:    Virginia Crews                                                    Physician's Signature:  Date:     This is to certify that I, the above signed therapist have the following affiliations: _5  This DME provider _6  Manufacturer of recommended equipment _7  Patient's long term care facility _8  None of the above  Therapist Name/Signature:       Arelia Sneddon PT DPT                                     Date: 01/08/18               Objective measurements completed on examination: See above findings.                             Patient will benefit from skilled therapeutic intervention in order to improve the following deficits and impairments:     Visit Diagnosis: Muscle weakness (generalized)     Problem  List Patient Active Problem List   Diagnosis Date Noted  . Diplopia 12/08/2017  . Carpal tunnel syndrome 12/08/2017  . Lower extremity edema 12/08/2017  . GERD (gastroesophageal reflux disease) 11/08/2017  . History of DVT (deep vein thrombosis) 07/08/2017  . Mixed hyperlipidemia 07/08/2017  . Microalbuminuria 05/24/2017  . Long term (current) use of anticoagulants 05/11/2017  . Eye pain 05/10/2017  . TIA (transient ischemic attack) 11/15/2015  . LVH (left ventricular hypertrophy) due to hypertensive disease, without heart failure 06/20/2014  . Moderate mitral insufficiency 06/20/2014  . PSVT (paroxysmal supraventricular tachycardia) ([place]) 06/20/2014  . History of gastric ulcer 03/04/2014  . Primary osteoarthritis of right hip 03/04/2014  . Pulmonary embolism 07/16/2013  . Type 2 diabetes mellitus without complications ([place]) 24/46/2863  . 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 OSA (obstructive sleep apnea) 10/28/2012  . Neuropathic pain 11/10/2011  . PVC's (premature ventricular contractions) 11/07/2011  . Morbid obesity ([place]) 11/06/2011  . Anxiety state 11/06/2011  . HTN (hypertension) 11/06/2011  . Major depressive disorder, recurrent episode, moderate ([place]) 07/15/2010    Alanson Puls 01/08/2018, 8:12 AM  [place] [facility] SERVICES [address] [place], [place], [address] Phone: [phone]   Fax:  [phone]  Name: Stacey [name] MRN: [id number] Date of Birth: 1961/03/19

## 2018-01-09 ENCOUNTER — Ambulatory Visit: Payer: Self-pay | Admitting: Family Medicine

## 2018-01-24 ENCOUNTER — Ambulatory Visit: Payer: Medicaid Other | Admitting: Physical Therapy

## 2018-03-05 ENCOUNTER — Telehealth: Payer: Self-pay

## 2018-03-05 NOTE — Telephone Encounter (Signed)
Patient reports that she still has not received a wheel chair and hers is falling apart. She wanted [place] to see if we can get her a new one. Please

## 2018-03-06 ENCOUNTER — Other Ambulatory Visit: Payer: Self-pay | Admitting: Family Medicine

## 2018-03-08 ENCOUNTER — Ambulatory Visit: Payer: Self-pay | Admitting: Family Medicine

## 2018-03-16 ENCOUNTER — Encounter: Payer: Self-pay | Admitting: Family Medicine

## 2018-03-16 ENCOUNTER — Ambulatory Visit (INDEPENDENT_AMBULATORY_CARE_PROVIDER_SITE_OTHER): Payer: Medicaid Other | Admitting: Physician Assistant

## 2018-03-16 ENCOUNTER — Ambulatory Visit: Payer: Self-pay | Admitting: Family Medicine

## 2018-03-16 DIAGNOSIS — I2699 Other pulmonary embolism without acute cor pulmonale: Secondary | ICD-10-CM

## 2018-03-16 LAB — POCT INR
INR: 3.6 — AB (ref 2.0–3.0)
PT: 43

## 2018-03-16 NOTE — Patient Instructions (Addendum)
Description   Take 1/2 of dose tomorrow and Continue 10 mg daily, except for 5 mg on Wednesdays and Fridays. Follow up in 2 weeks

## 2018-03-16 NOTE — Progress Notes (Signed)
Patient was given an official discharge letter today in the office by Dr. Brita Romp with [facility], [place] witnessing. She is being discharged for noncompliance and multiple no show appointments. A copy of the letter was also made and will be mailed via certified mail as well. She will have 30 days from today to find another PCP. She will be able to be seen for acute visits and INR checks.

## 2018-03-30 ENCOUNTER — Ambulatory Visit (INDEPENDENT_AMBULATORY_CARE_PROVIDER_SITE_OTHER): Payer: Medicaid Other

## 2018-03-30 ENCOUNTER — Telehealth: Payer: Self-pay

## 2018-03-30 DIAGNOSIS — I2699 Other pulmonary embolism without acute cor pulmonale: Secondary | ICD-10-CM | POA: Diagnosis not present

## 2018-03-30 LAB — POCT INR
INR: 1.9 — AB (ref 2.0–3.0)
PT: 22.9

## 2018-03-30 NOTE — Patient Instructions (Addendum)
Description   10 mg daily except 5 mg on Wednesday. Follow-up INR with new PCP in 2 weeks

## 2018-03-30 NOTE — Telephone Encounter (Signed)
Noted. Agree.  She has been dismissed from the practice and is only eligible for same day appointments.  Virginia Crews, MD, MPH [facility] 03/30/2018 4:27 PM

## 2018-03-30 NOTE — Telephone Encounter (Signed)
Patient walk in c/o blood clot. Advised patient providers at this moment done for the morning and headed to lunch but I can check to see what the next available appointment was and schedule her at that time but Dr.B advised patient to go to the ED. Patient reported she will call on Monday because she doesn't want to go to the ED stated "because they don't do anything' because she is on "blood thinner". Told patient we are closed on Monday. Per patient she is going to call Tuesday. Advised patient that because of possible blood clot even thou she is on blood thinner to still go to the ED if worsen.

## 2018-04-08 ENCOUNTER — Encounter: Payer: Self-pay | Admitting: Emergency Medicine

## 2018-04-08 ENCOUNTER — Emergency Department
Admission: EM | Admit: 2018-04-08 | Discharge: 2018-04-08 | Disposition: A | Discharge status: Home / Self Care | Payer: Medicaid Other | Attending: Emergency Medicine | Admitting: Emergency Medicine

## 2018-04-08 ENCOUNTER — Emergency Department: Payer: Medicaid Other

## 2018-04-08 DIAGNOSIS — J45909 Unspecified asthma, uncomplicated: Secondary | ICD-10-CM | POA: Insufficient documentation

## 2018-04-08 DIAGNOSIS — M79662 Pain in left lower leg: Secondary | ICD-10-CM | POA: Diagnosis present

## 2018-04-08 DIAGNOSIS — Z79899 Other long term (current) drug therapy: Secondary | ICD-10-CM | POA: Insufficient documentation

## 2018-04-08 DIAGNOSIS — Z87891 Personal history of nicotine dependence: Secondary | ICD-10-CM | POA: Diagnosis not present

## 2018-04-08 DIAGNOSIS — Z8673 Personal history of transient ischemic attack (TIA), and cerebral infarction without residual deficits: Secondary | ICD-10-CM | POA: Insufficient documentation

## 2018-04-08 DIAGNOSIS — E119 Type 2 diabetes mellitus without complications: Secondary | ICD-10-CM | POA: Diagnosis not present

## 2018-04-08 DIAGNOSIS — Z7901 Long term (current) use of anticoagulants: Secondary | ICD-10-CM | POA: Diagnosis not present

## 2018-04-08 DIAGNOSIS — Z86718 Personal history of other venous thrombosis and embolism: Secondary | ICD-10-CM | POA: Insufficient documentation

## 2018-04-08 DIAGNOSIS — I10 Essential (primary) hypertension: Secondary | ICD-10-CM | POA: Diagnosis not present

## 2018-04-08 NOTE — ED Triage Notes (Signed)
Patient presents to the ED with left leg pain x 2 weeks.  Patient reports history of blood clots.  Patient states leg seems slightly swollen and is becoming difficult to lift.  Patient is in no obvious distress at this time.

## 2018-04-08 NOTE — ED Provider Notes (Signed)
[facility] Emergency Department Provider Note  ____________________________________________   First MD Initiated Contact with Patient 04/08/18 1023     (approximate)  I have reviewed the triage vital signs and the nursing notes.   HISTORY  Chief Complaint Leg Pain    HPI Stacey [name] is a 57 y.o. female history of previous DVT on Coumadin, pulmonary embolism and type 2 diabetes with morbid obesity  Patient reports for about 2 to 3 weeks now she been experiencing pain in the back of the left knee that radiates in the left calf region.  Is worsened with attempts to move.  She has not noticed any swelling, but reports because of her history of previous blood clots she wants to make sure she does not have a blood clot doing the left leg.  Patient reports she does not currently wish for any pain medications, she does take oxycodone at home.  Reports the pain at present is mild, mostly wants to make sure it is not a blood clot in the leg.  Also does get a little bit of an occasional twinge of discomfort in her left lower back for the last couple weeks as well.  Denies significant left lower back pain.  No fevers or chills.  No chest pain or trouble breathing.  On Coumadin and has her levels checked regularly.  Past Medical History:  Diagnosis Date  . Anxiety   . Arthritis   . Asthma   . 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 C. difficile colitis 11/08/2011  . Cyst    pt has "cyst" to legs for years, areas drain at time  . Depression   . Diverticulitis 2016  . DVT (deep venous thrombosis) ([place]) 2014  . Hypertension   . Incisional hernia    in C-section incision  . Musculoskeletal pain 11/08/2011  . Neuropathic pain 11/10/2011  . Pancreatitis 1991  . Pulmonary embolism ([place]) 2014  . Sleep apnea with use of continuous positive airway pressure (CPAP)    uses CPAP at night  . Type 2 diabetes mellitus [facility])     Patient Active  Problem List   Diagnosis Date Noted  . Diplopia 12/08/2017  . Carpal tunnel syndrome 12/08/2017  . Lower extremity edema 12/08/2017  . GERD (gastroesophageal reflux disease) 11/08/2017  . History of DVT (deep vein thrombosis) 07/08/2017  . Mixed hyperlipidemia 07/08/2017  . Microalbuminuria 05/24/2017  . Long term (current) use of anticoagulants 05/11/2017  . Eye pain 05/10/2017  . TIA (transient ischemic attack) 11/15/2015  . LVH (left ventricular hypertrophy) due to hypertensive disease, without heart failure 06/20/2014  . Moderate mitral insufficiency 06/20/2014  . PSVT (paroxysmal supraventricular tachycardia) ([place]) 06/20/2014  . History of gastric ulcer 03/04/2014  . Primary osteoarthritis of right hip 03/04/2014  . Pulmonary embolism 07/16/2013  . Type 2 diabetes mellitus without complications ([place]) 51/09/5850  . 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 OSA (obstructive sleep apnea) 10/28/2012  . Neuropathic pain 11/10/2011  . PVC's (premature ventricular contractions) 11/07/2011  . Morbid obesity ([place]) 11/06/2011  . Anxiety state 11/06/2011  . HTN (hypertension) 11/06/2011  . Major depressive disorder, recurrent episode, moderate ([place]) 07/15/2010    Past Surgical History:  Procedure Laterality Date  . CESAREAN SECTION     x2  . CHOLECYSTECTOMY  1991   laproscopic  . NOSE SURGERY  1982  . SKIN BIOPSY    . TONSILLECTOMY  1980    Prior to Admission medications  Medication Sig Start Date End Date Taking? Authorizing Provider  buPROPion (WELLBUTRIN XL) 300 MG 24 hr tablet Take 300 mg by mouth daily.     [provider]  busPIRone (BUSPAR) 15 MG tablet Take 2 tablets (30 mg total) by mouth 2 (two) times daily. 08/15/13   Dixon Boos, MD  COUMADIN 10 MG tablet TAKE 1 TABLET BY MOUTH ONCE A DAY 10/16/17   Algernon Huxley, MD  escitalopram (LEXAPRO) 20 MG tablet Take 20 mg by mouth daily.     [provider]  fluticasone (FLONASE) 50 MCG/ACT nasal spray Place  2 sprays into both nostrils daily. Patient not taking: Reported on 11/08/2017 04/05/17   Virginia Crews, MD  lisinopril (PRINIVIL,ZESTRIL) 5 MG tablet Take 1 tablet (5 mg total) by mouth daily. 05/24/17   Bacigalupo, Dionne Bucy, MD  LORazepam (ATIVAN) 2 MG tablet Take 1 tablet (2 mg total) by mouth 3 (three) times daily as needed for anxiety. Patient taking differently: Take 1 mg by mouth 2 (two) times daily.  04/05/17   Bacigalupo, Dionne Bucy, MD  nystatin cream (MYCOSTATIN) Apply 1 application topically 2 (two) times daily as needed (candidal rash). 12/08/17   Virginia Crews, MD  PROAIR HFA 108 [phone] Base) MCG/ACT inhaler INHALE TWO PUFFS BY MOUTH EVERY FOUR HOURS AS NEEDED FOR SHORTNESS OFBREATH OR WHEEZING 03/07/18   Virginia Crews, MD  warfarin (COUMADIN) 10 MG tablet Take 10mg  daily x5days per week and 5mg  daily 2 days weekly 04/05/17   Virginia Crews, MD    Allergies Hydrocodone; Nitroglycerin; Tramadol; Hydromorphone; Levaquin [levofloxacin in d5w]; Toradol [ketorolac tromethamine]; Levofloxacin; Sulfa antibiotics; Tape; and Trazodone and nefazodone  Family History  Problem Relation Age of Onset  . Stroke Other   . Cancer Other   . Diabetes Other   . Heart disease Mother   . Diabetes Mother   . Cancer Father        lung  . Diabetes Sister   . Diabetes Brother   . Cancer Maternal Aunt   . Alcohol abuse Maternal Uncle   . Cancer Maternal Grandfather   . Diabetes Brother     Social History Social History   Tobacco Use  . Smoking status: Former Smoker    Years: 2.00    Types: Cigarettes    Last attempt to quit: 2003    Years since quitting: 16.6  . Smokeless tobacco: Never Used  . Tobacco comment: former light cigarette smoker  Substance Use Topics  . Alcohol use: No  . Drug use: No    Review of Systems Constitutional: No fever/chills Eyes: No visual changes. ENT: No sore throat. Cardiovascular: Denies chest pain. Respiratory: Denies shortness of  breath. Gastrointestinal: No abdominal pain.  No nausea, no vomiting.  No diarrhea.  No constipation. Genitourinary: Negative for dysuria. Musculoskeletal: Her weakness in the left leg.  No cold or blue left foot.  No numbness or tingling in the left foot.  No problems involving the right leg.  She reports she generally has to use a wheelchair at home, this is not new for her. Skin: Negative for rash. Neurological: Negative for headaches, focal weakness or numbness.    ____________________________________________   PHYSICAL EXAM:  VITAL SIGNS: ED Triage Vitals  Enc Vitals Group     BP 04/08/18 0848 (!) 142/65     Pulse Rate 04/08/18 0848 66     Resp 04/08/18 0848 18     Temp 04/08/18 0848 99 F (37.2 C)  Temp Source 04/08/18 0848 Oral     SpO2 04/08/18 0848 98 %     Weight 04/08/18 0849 (!) 440 lb (199.6 kg)     Height 04/08/18 0849 5\' 6"  (1.676 m)     Head Circumference --      Peak Flow --      Pain Score 04/08/18 0849 9     Pain Loc --      Pain Edu? --      Excl. in [place]? --     Constitutional: Alert and oriented. Well appearing and in no acute distress.  Patient and her family at the bedside both very pleasant. Eyes: Conjunctivae are normal. Head: Atraumatic. Nose: No congestion/rhinnorhea. Mouth/Throat: Mucous membranes are moist. Neck: No stridor.   Cardiovascular: Normal rate, regular rhythm. Grossly normal heart sounds.  Good peripheral circulation. Respiratory: Normal respiratory effort.  No retractions. Lungs CTAB. Gastrointestinal: Soft and nontender. No distention.  Morbidly obese. Musculoskeletal: No lower extremity tenderness nor edema except some slight discomfort on the left superior calf to palpation.  Normal flexion plantar and dorsi of the left foot.  Strong palpable dorsalis pedis and posterior tibial pulses with normal cap refill involving the left foot.  She is able to range the left leg well, reports slight discomfort through range of motion of the  left knee.  Denies any trauma or falls occurred.  No evidence of traumatic injury.  Patella nontender.  No overlying redness swelling or erythema involving the left lower extremity left knee or hip. Neurologic:  Normal speech and language. No gross focal neurologic deficits are appreciated.  Skin:  Skin is warm, dry and intact. No rash noted. Psychiatric: Mood and affect are normal. Speech and behavior are normal.  ____________________________________________   LABS (all labs ordered are listed, but only abnormal results are displayed)  Labs Reviewed - No data to display ____________________________________________  EKG   ____________________________________________  RADIOLOGY  Dg Lumbar Spine 2-3 Views  Result Date: 04/08/2018 CLINICAL DATA:  57 year old female with a history of leg pain for 2 weeks EXAM: LUMBAR SPINE - 2-3 VIEW COMPARISON:  None. FINDINGS: Limited plain film by underpenetration secondary to the patient's soft tissues of the abdomen. Degenerative changes disc disease are present. The study is nondiagnostic for exclusion of a nondisplaced fracture. IMPRESSION: Limited study demonstrates degenerative changes, with the patient's soft tissues precluding plain film ability to assess for a fracture Electronically Signed   By: Corrie Mckusick D.O.   On: 04/08/2018 12:04   US Venous Img Lower Unilateral Left  Result Date: 04/08/2018 CLINICAL DATA:  Left lower leg pain and history of DVT. EXAM: Left LOWER EXTREMITY VENOUS DOPPLER ULTRASOUND TECHNIQUE: Gray-scale sonography with graded compression, as well as color Doppler and duplex ultrasound were performed to evaluate the lower extremity deep venous systems from the level of the common femoral vein and including the common femoral, femoral, profunda femoral, popliteal and calf veins including the posterior tibial, peroneal and gastrocnemius veins when visible. The superficial great saphenous vein was also interrogated. Spectral  Doppler was utilized to evaluate flow at rest and with distal augmentation maneuvers in the common femoral, femoral and popliteal veins. COMPARISON:  None. FINDINGS: Contralateral Common Femoral Vein: Respiratory phasicity is normal and symmetric with the symptomatic side. No evidence of thrombus. Normal compressibility. Common Femoral Vein: No evidence of thrombus. Normal compressibility, respiratory phasicity and response to augmentation. Saphenofemoral Junction: No evidence of thrombus. Normal compressibility and flow on color Doppler imaging. Profunda Femoral Vein: No evidence  of thrombus. Normal compressibility and flow on color Doppler imaging. Femoral Vein: No evidence of thrombus. Normal compressibility, respiratory phasicity and response to augmentation. Popliteal Vein: No evidence of thrombus. Normal compressibility, respiratory phasicity and response to augmentation. Calf Veins: No evidence of thrombus. Normal compressibility and flow on color Doppler imaging. Superficial Great Saphenous Vein: No evidence of thrombus. Normal compressibility. Venous Reflux:  None. Other Findings: Mildly reduced sensitivity is specially in the calf due to body habitus. IMPRESSION: No evidence of deep venous thrombosis. Electronically Signed   By: Van Clines M.D.   On: 04/08/2018 11:51      negative for DVT. ____________________________________________   PROCEDURES  Procedure(s) performed: None  Procedures  Critical Care performed: No  ____________________________________________   INITIAL IMPRESSION / ASSESSMENT AND PLAN / ED COURSE  Pertinent labs & imaging results that were available during my care of the patient were reviewed by me and considered in my medical decision making (see chart for details).  Left leg pain.  Exclude DVT given her clinical history, currently anticoagulated on Coumadin following with her Coumadin prescriber regularly.  No pulmonary or cardiac symptoms.  Some slight  left lower back discomfort and given the patient's morbid obesity I will obtain an x-ray to evaluate for possible compression fracture, though my overall suspicion is low.  Suspect likely musculoskeletal slight strain possible Baker's cyst, somewhat hard to know by clinical exam alone given the morbid obesity.      ----------------------------------------- 12:27 PM on 04/08/2018 -----------------------------------------  Imaging studies negative for etiology.  Suspect likely musculoskeletal, no evidence of acute DVT.  No evidence of acute infectious neurologic or vascular etiology.  Discussed with patient will discharge home, follow-up with primary care.  Return precautions and treatment recommendations and follow-up discussed with the patient who is agreeable with the plan.    ____________________________________________   FINAL CLINICAL IMPRESSION(S) / ED DIAGNOSES  Final diagnoses:  Pain of left calf      NEW MEDICATIONS STARTED DURING THIS VISIT:  New Prescriptions   No medications on file     Note:  This document was prepared using Dragon voice recognition software and may include unintentional dictation errors.     Delman Kitten, MD 04/08/18 1549

## 2018-04-08 NOTE — ED Notes (Signed)
Patient to RM 16, Musician aware of room placement.

## 2018-04-08 NOTE — ED Notes (Signed)
First Nurse Note: Patient drove POV to ED, helped into [facility] and transported to ED desk.  States MD told her to come in for possible blood clots.  Alert and oriented, NAD.

## 2018-05-11 ENCOUNTER — Other Ambulatory Visit (INDEPENDENT_AMBULATORY_CARE_PROVIDER_SITE_OTHER): Payer: Self-pay | Admitting: Vascular Surgery

## 2018-05-13 ENCOUNTER — Encounter (HOSPITAL_COMMUNITY): Payer: Self-pay | Admitting: Emergency Medicine

## 2018-05-13 ENCOUNTER — Emergency Department (HOSPITAL_COMMUNITY): Payer: Medicaid Other

## 2018-05-13 ENCOUNTER — Emergency Department (HOSPITAL_COMMUNITY)
Admission: EM | Admit: 2018-05-13 | Discharge: 2018-05-13 | Disposition: A | Discharge status: Home / Self Care | Payer: Medicaid Other | Attending: Emergency Medicine | Admitting: Emergency Medicine

## 2018-05-13 ENCOUNTER — Other Ambulatory Visit: Payer: Self-pay

## 2018-05-13 DIAGNOSIS — E119 Type 2 diabetes mellitus without complications: Secondary | ICD-10-CM | POA: Insufficient documentation

## 2018-05-13 DIAGNOSIS — R197 Diarrhea, unspecified: Secondary | ICD-10-CM | POA: Diagnosis not present

## 2018-05-13 DIAGNOSIS — R1084 Generalized abdominal pain: Secondary | ICD-10-CM | POA: Diagnosis not present

## 2018-05-13 DIAGNOSIS — J45909 Unspecified asthma, uncomplicated: Secondary | ICD-10-CM | POA: Diagnosis not present

## 2018-05-13 DIAGNOSIS — Z86718 Personal history of other venous thrombosis and embolism: Secondary | ICD-10-CM | POA: Insufficient documentation

## 2018-05-13 DIAGNOSIS — Z7901 Long term (current) use of anticoagulants: Secondary | ICD-10-CM | POA: Diagnosis not present

## 2018-05-13 DIAGNOSIS — N390 Urinary tract infection, site not specified: Secondary | ICD-10-CM | POA: Insufficient documentation

## 2018-05-13 DIAGNOSIS — I10 Essential (primary) hypertension: Secondary | ICD-10-CM | POA: Insufficient documentation

## 2018-05-13 DIAGNOSIS — Z87891 Personal history of nicotine dependence: Secondary | ICD-10-CM | POA: Insufficient documentation

## 2018-05-13 DIAGNOSIS — Z79899 Other long term (current) drug therapy: Secondary | ICD-10-CM | POA: Insufficient documentation

## 2018-05-13 DIAGNOSIS — R109 Unspecified abdominal pain: Secondary | ICD-10-CM

## 2018-05-13 DIAGNOSIS — Z8673 Personal history of transient ischemic attack (TIA), and cerebral infarction without residual deficits: Secondary | ICD-10-CM | POA: Insufficient documentation

## 2018-05-13 DIAGNOSIS — E782 Mixed hyperlipidemia: Secondary | ICD-10-CM | POA: Insufficient documentation

## 2018-05-13 LAB — CBC WITH DIFFERENTIAL/PLATELET
Abs Immature Granulocytes: 0.02 10*3/uL (ref 0.00–0.07)
Basophils Absolute: 0 10*3/uL (ref 0.0–0.1)
Basophils Relative: 1 %
EOS PCT: 2 %
Eosinophils Absolute: 0.2 10*3/uL (ref 0.0–0.5)
HEMATOCRIT: 38.6 % (ref 36.0–46.0)
HEMOGLOBIN: 11.8 g/dL — AB (ref 12.0–15.0)
Immature Granulocytes: 0 %
LYMPHS ABS: 1.6 10*3/uL (ref 0.7–4.0)
Lymphocytes Relative: 23 %
MCH: 27.4 pg (ref 26.0–34.0)
MCHC: 30.6 g/dL (ref 30.0–36.0)
MCV: 89.6 fL (ref 80.0–100.0)
MONO ABS: 0.6 10*3/uL (ref 0.1–1.0)
MONOS PCT: 9 %
Neutro Abs: 4.4 10*3/uL (ref 1.7–7.7)
Neutrophils Relative %: 65 %
Platelets: 232 10*3/uL (ref 150–400)
RBC: 4.31 MIL/uL (ref 3.87–5.11)
RDW: 13.3 % (ref 11.5–15.5)
WBC: 6.7 10*3/uL (ref 4.0–10.5)
nRBC: 0 % (ref 0.0–0.2)

## 2018-05-13 LAB — COMPREHENSIVE METABOLIC PANEL
ALT: 23 U/L (ref 0–44)
AST: 25 U/L (ref 15–41)
Albumin: 3.6 g/dL (ref 3.5–5.0)
Alkaline Phosphatase: 67 U/L (ref 38–126)
Anion gap: 6 (ref 5–15)
BILIRUBIN TOTAL: 0.5 mg/dL (ref 0.3–1.2)
BUN: 14 mg/dL (ref 6–20)
CHLORIDE: 106 mmol/L (ref 98–111)
CO2: 25 mmol/L (ref 22–32)
CREATININE: 0.76 mg/dL (ref 0.44–1.00)
Calcium: 9.1 mg/dL (ref 8.9–10.3)
Glucose, Bld: 154 mg/dL — ABNORMAL HIGH (ref 70–99)
Potassium: 3.8 mmol/L (ref 3.5–5.1)
Sodium: 137 mmol/L (ref 135–145)
TOTAL PROTEIN: 7.2 g/dL (ref 6.5–8.1)

## 2018-05-13 LAB — URINALYSIS, ROUTINE W REFLEX MICROSCOPIC
Bilirubin Urine: NEGATIVE
GLUCOSE, UA: NEGATIVE mg/dL
Ketones, ur: NEGATIVE mg/dL
Nitrite: POSITIVE — AB
PH: 5 (ref 5.0–8.0)
Protein, ur: NEGATIVE mg/dL
SPECIFIC GRAVITY, URINE: 1.023 (ref 1.005–1.030)

## 2018-05-13 LAB — LIPASE, BLOOD: LIPASE: 35 U/L (ref 11–51)

## 2018-05-13 LAB — PROTIME-INR
INR: 2.77
Prothrombin Time: 29 seconds — ABNORMAL HIGH (ref 11.4–15.2)

## 2018-05-13 MED ORDER — SODIUM CHLORIDE 0.9 % IV BOLUS: 1000.0000 mL | Freq: Once | INTRAVENOUS | Status: DC

## 2018-05-13 MED ORDER — NITROFURANTOIN MONOHYD MACRO 100 MG PO CAPS: 100.0000 mg | ORAL_CAPSULE | Freq: Two times a day (BID) | ORAL | 0 refills | Status: DC

## 2018-05-13 MED ORDER — FENTANYL CITRATE (PF) 100 MCG/2ML IJ SOLN
50.0000 ug | Freq: Once | INTRAMUSCULAR | Status: DC
  Filled 2018-05-13: qty 2

## 2018-05-13 MED ORDER — NITROFURANTOIN MONOHYD MACRO 100 MG PO CAPS: 100.0000 mg | ORAL_CAPSULE | Freq: Two times a day (BID) | ORAL | Status: DC

## 2018-05-13 MED ORDER — ONDANSETRON HCL 4 MG/2ML IJ SOLN
4.0000 mg | Freq: Once | INTRAMUSCULAR | Status: DC
  Filled 2018-05-13: qty 2

## 2018-05-13 MED ORDER — CEPHALEXIN 500 MG PO CAPS
1000.0000 mg | ORAL_CAPSULE | Freq: Once | ORAL | Status: AC
  Administered 2018-05-13: 1000 mg via ORAL
  Filled 2018-05-13: qty 2

## 2018-05-13 MED ORDER — DICYCLOMINE HCL 20 MG PO TABS: 20.0000 mg | ORAL_TABLET | Freq: Two times a day (BID) | ORAL | 0 refills | Status: DC

## 2018-05-13 NOTE — Discharge Instructions (Addendum)
CT scan of your abdomen showed no serious condition.  You do have a urinary tract infection.  Increase fluids.  Prescription for antibiotic.  Need to get a primary care doctor.

## 2018-05-13 NOTE — ED Triage Notes (Signed)
Pt reports abd pain generalized, and nausea with diarrhea for couple of weeks per pt. Pt also states she wants her coumadin level checked.

## 2018-05-13 NOTE — ED Notes (Signed)
Pt reports she gets very sick to her stomach with IV contrast. Pt also mentions to RN that she was told by a MD or RN in the past that she might possibly be allergic to shellfish. Pt unsure. Dr. Lacinda Axon made aware and said to change CT scan to PO contrast versus IV contrast. CT tech notified of change.

## 2018-05-13 NOTE — ED Notes (Signed)
Patient transported to CT 

## 2018-05-13 NOTE — ED Provider Notes (Signed)
[facility] EMERGENCY DEPARTMENT Provider Note   CSN: [id number] Arrival date & time: 05/13/18  4098     History   Chief Complaint Chief Complaint  Patient presents with  . Abdominal Pain    HPI Stacey [name] is a 57 y.o. female.  Patient reports generalized abdominal pain with associated diarrhea for 2 weeks.  She reports weakness, malodorous diarrhea, nausea.  Past medical history includes morbid obesity, DVT, PE, hypertension, depression, pancreatitis, type 2 diabetes, many others.  She is eating.  No fever, chills, chest pain, dyspnea, dysuria.  Severity of symptoms is mild to moderate.  Nothing makes symptoms better or worse     Past Medical History:  Diagnosis Date  . Anxiety   . Arthritis   . Asthma   . 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 C. difficile colitis 11/08/2011  . Cyst    pt has "cyst" to legs for years, areas drain at time  . Depression   . Diverticulitis 2016  . DVT (deep venous thrombosis) ([place]) 2014  . Hypertension   . Incisional hernia    in C-section incision  . Musculoskeletal pain 11/08/2011  . Neuropathic pain 11/10/2011  . Pancreatitis 1991  . Pulmonary embolism ([place]) 2014  . Sleep apnea with use of continuous positive airway pressure (CPAP)    uses CPAP at night  . Type 2 diabetes mellitus [facility])     Patient Active Problem List   Diagnosis Date Noted  . Diplopia 12/08/2017  . Carpal tunnel syndrome 12/08/2017  . Lower extremity edema 12/08/2017  . GERD (gastroesophageal reflux disease) 11/08/2017  . History of DVT (deep vein thrombosis) 07/08/2017  . Mixed hyperlipidemia 07/08/2017  . Microalbuminuria 05/24/2017  . Long term (current) use of anticoagulants 05/11/2017  . Eye pain 05/10/2017  . TIA (transient ischemic attack) 11/15/2015  . LVH (left ventricular hypertrophy) due to hypertensive disease, without heart failure 06/20/2014  . Moderate mitral insufficiency 06/20/2014  . PSVT (paroxysmal  supraventricular tachycardia) ([place]) 06/20/2014  . History of gastric ulcer 03/04/2014  . Primary osteoarthritis of right hip 03/04/2014  . Pulmonary embolism 07/16/2013  . Type 2 diabetes mellitus without complications ([place]) 11/91/4782  . 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 OSA (obstructive sleep apnea) 10/28/2012  . Neuropathic pain 11/10/2011  . PVC's (premature ventricular contractions) 11/07/2011  . Morbid obesity ([place]) 11/06/2011  . Anxiety state 11/06/2011  . HTN (hypertension) 11/06/2011  . Major depressive disorder, recurrent episode, moderate ([place]) 07/15/2010    Past Surgical History:  Procedure Laterality Date  . CESAREAN SECTION     x2  . CHOLECYSTECTOMY  1991   laproscopic  . NOSE SURGERY  1982  . SKIN BIOPSY    . TONSILLECTOMY  1980     OB History    Gravida  3   Para  2   Term  2   Preterm      AB  1   Living  2     SAB  1   TAB      Ectopic      Multiple      Live Births               Home Medications    Prior to Admission medications   Medication Sig Start Date End Date Taking? Authorizing Provider  buPROPion (WELLBUTRIN XL) 300 MG 24 hr tablet Take 300 mg by mouth daily.     [provider]  busPIRone (BUSPAR) 15 MG  tablet Take 2 tablets (30 mg total) by mouth 2 (two) times daily. 08/15/13   Dixon Boos, MD  COUMADIN 10 MG tablet TAKE 1 TABLET BY MOUTH ONCE A DAY 10/16/17   Algernon Huxley, MD  dicyclomine (BENTYL) 20 MG tablet Take 1 tablet (20 mg total) by mouth 2 (two) times daily. 05/13/18   Nat Christen, MD  escitalopram (LEXAPRO) 20 MG tablet Take 20 mg by mouth daily.     [provider]  fluticasone (FLONASE) 50 MCG/ACT nasal spray Place 2 sprays into both nostrils daily. Patient not taking: Reported on 11/08/2017 04/05/17   Virginia Crews, MD  lisinopril (PRINIVIL,ZESTRIL) 5 MG tablet Take 1 tablet (5 mg total) by mouth daily. 05/24/17   Bacigalupo, Dionne Bucy, MD  LORazepam (ATIVAN) 2 MG tablet Take 1  tablet (2 mg total) by mouth 3 (three) times daily as needed for anxiety. Patient taking differently: Take 1 mg by mouth 2 (two) times daily.  04/05/17   Virginia Crews, MD  nitrofurantoin, macrocrystal-monohydrate, (MACROBID) 100 MG capsule Take 1 capsule (100 mg total) by mouth 2 (two) times daily. 05/13/18   Nat Christen, MD  nystatin cream (MYCOSTATIN) Apply 1 application topically 2 (two) times daily as needed (candidal rash). 12/08/17   Virginia Crews, MD  PROAIR HFA 108 [phone] Base) MCG/ACT inhaler INHALE TWO PUFFS BY MOUTH EVERY FOUR HOURS AS NEEDED FOR SHORTNESS OFBREATH OR WHEEZING 03/07/18   Virginia Crews, MD  warfarin (COUMADIN) 10 MG tablet Take 10mg  daily x5days per week and 5mg  daily 2 days weekly 04/05/17   Virginia Crews, MD    Family History Family History  Problem Relation Age of Onset  . Stroke Other   . Cancer Other   . Diabetes Other   . Heart disease Mother   . Diabetes Mother   . Cancer Father        lung  . Diabetes Sister   . Diabetes Brother   . Cancer Maternal Aunt   . Alcohol abuse Maternal Uncle   . Cancer Maternal Grandfather   . Diabetes Brother     Social History Social History   Tobacco Use  . Smoking status: Former Smoker    Years: 2.00    Types: Cigarettes    Last attempt to quit: 2003    Years since quitting: 16.7  . Smokeless tobacco: Never Used  . Tobacco comment: former light cigarette smoker  Substance Use Topics  . Alcohol use: No  . Drug use: No     Allergies   Hydrocodone; Nitroglycerin; Tramadol; Zofran [ondansetron hcl]; Hydromorphone; Levaquin [levofloxacin in d5w]; Toradol [ketorolac tromethamine]; Shellfish allergy; Levofloxacin; Sulfa antibiotics; Tape; and Trazodone and nefazodone   Review of Systems Review of Systems  All other systems reviewed and are negative.    Physical Exam Updated Vital Signs BP (!) 143/77   Pulse 72   Temp 98.7 F (37.1 C) (Oral)   Resp 20   Ht 5\' 6"  (1.676 m)   Wt  (!) 203 kg   SpO2 99%   BMI 72.24 kg/m   Physical Exam  Constitutional: She is oriented to person, place, and time.  Morbidly obese, no acute distress  HENT:  Head: Normocephalic and atraumatic.  Eyes: Conjunctivae are normal.  Neck: Neck supple.  Cardiovascular: Normal rate and regular rhythm.  Pulmonary/Chest: Effort normal and breath sounds normal.  Abdominal: Soft. Bowel sounds are normal.  Minimal generalized tenderness.  Musculoskeletal: Normal range of motion.  Neurological:  She is alert and oriented to person, place, and time.  Skin: Skin is warm and dry.  Psychiatric: She has a normal mood and affect. Her behavior is normal.  Nursing note and vitals reviewed.    ED Treatments / Results  Labs (all labs ordered are listed, but only abnormal results are displayed) Labs Reviewed  CBC WITH DIFFERENTIAL/PLATELET - Abnormal; Notable for the following components:      Result Value   Hemoglobin 11.8 (*)    All other components within normal limits  COMPREHENSIVE METABOLIC PANEL - Abnormal; Notable for the following components:   Glucose, Bld 154 (*)    All other components within normal limits  URINALYSIS, ROUTINE W REFLEX MICROSCOPIC - Abnormal; Notable for the following components:   APPearance HAZY (*)    Hgb urine dipstick MODERATE (*)    Nitrite POSITIVE (*)    Leukocytes, UA TRACE (*)    Bacteria, UA MANY (*)    All other components within normal limits  PROTIME-INR - Abnormal; Notable for the following components:   Prothrombin Time 29.0 (*)    All other components within normal limits  C DIFFICILE QUICK SCREEN W PCR REFLEX  URINE CULTURE  LIPASE, BLOOD    EKG None  Radiology Ct Abdomen Pelvis Wo Contrast  Result Date: 05/13/2018 CLINICAL DATA:  Generalized abdominal pain with nausea and diarrhea for the past few weeks. EXAM: CT ABDOMEN AND PELVIS WITHOUT CONTRAST TECHNIQUE: Multidetector CT imaging of the abdomen and pelvis was performed following  the standard protocol without IV contrast. COMPARISON:  CT abdomen pelvis dated April 23, 2015. FINDINGS: Lower chest: No acute abnormality. Hepatobiliary: Diffuse hepatic steatosis. No focal liver abnormality. Status post cholecystectomy. No biliary dilatation. Pancreas: Unremarkable. No pancreatic ductal dilatation or surrounding inflammatory changes. Spleen: Normal in size without focal abnormality. Adrenals/Urinary Tract: Adrenal glands are unremarkable. Mild bilateral perinephric fat stranding. No renal calculi, focal lesion, or hydronephrosis. Bladder is decompressed. Stomach/Bowel: Stomach is within normal limits. Appendix appears normal. No evidence of bowel wall thickening, distention, or inflammatory changes. Vascular/Lymphatic: No significant vascular findings are present. No enlarged abdominal or pelvic lymph nodes. Reproductive: Uterus and bilateral adnexa are unremarkable. Other: No free fluid or pneumoperitoneum. Unchanged moderate fat containing periumbilical hernia. Musculoskeletal: No acute or significant osseous findings. Severe right and moderate left hip osteoarthritis. IMPRESSION: 1.  No acute intra-abdominal process. 2. Hepatic steatosis. Electronically Signed   By: Titus Dubin M.D.   On: 05/13/2018 14:33    Procedures Procedures (including critical care time)  Medications Ordered in ED Medications  sodium chloride 0.9 % bolus 1,000 mL (1,000 mLs Intravenous Refused 05/13/18 1055)  ondansetron (ZOFRAN) injection 4 mg (4 mg Intravenous Refused 05/13/18 1039)  fentaNYL (SUBLIMAZE) injection 50 mcg (50 mcg Intravenous Refused 05/13/18 1039)  cephALEXin (KEFLEX) capsule 1,000 mg (1,000 mg Oral Given 05/13/18 1342)     Initial Impression / Assessment and Plan / ED Course  I have reviewed the triage vital signs and the nursing notes.  Pertinent labs & imaging results that were available during my care of the patient were reviewed by me and considered in my medical decision  making (see chart for details).     Patient reports generalized abdominal pain with diarrhea.  White count normal.  CT scan shows no acute pathology.  Urinalysis reveals infection.  INR within acceptable range.  No evidence of pyelonephritis.  Will start oral antibiotics.  Urine culture.  Discharge medications Macrobid and Bentyl 20 mg  Final Clinical Impressions(s) /  ED Diagnoses   Final diagnoses:  Abdominal pain, unspecified abdominal location  Urinary tract infection without hematuria, site unspecified  Diarrhea, unspecified type    ED Discharge Orders         Ordered    nitrofurantoin, macrocrystal-monohydrate, (MACROBID) 100 MG capsule  2 times daily     05/13/18 1443    dicyclomine (BENTYL) 20 MG tablet  2 times daily     05/13/18 1443           Nat Christen, MD 05/13/18 1552

## 2018-05-15 LAB — URINE CULTURE: Culture: >=100000 — AB

## 2018-05-16 ENCOUNTER — Telehealth: Payer: Self-pay | Admitting: *Deleted

## 2018-05-16 NOTE — Telephone Encounter (Signed)
Post ED Visit - Positive Culture Follow-up  Culture report reviewed by antimicrobial stewardship pharmacist:  []  Elenor Quinones, Pharm.D. []  Heide Guile, Pharm.D., BCPS AQ-ID []  Parks Neptune, Pharm.D., BCPS []  Alycia Rossetti, Pharm.D., BCPS []  [place], Pharm.D., BCPS, AAHIVP []  Legrand Como, Pharm.D., BCPS, AAHIVP []  Salome Arnt, PharmD, BCPS []  Johnnette Gourd, PharmD, BCPS []  Hughes Better, PharmD, BCPS []  Leeroy Cha, PharmD Elicia Lamp, PharmD  Positive urine culture Treated with Nitrofurantoin Monohyd Macro, organism sensitive to the same and no further patient follow-up is required at this time.  Harlon Flor [place] 05/16/2018, 11:00 AM

## 2018-05-18 ENCOUNTER — Emergency Department (HOSPITAL_COMMUNITY)
Admission: EM | Admit: 2018-05-18 | Discharge: 2018-05-18 | Disposition: A | Discharge status: Home / Self Care | Payer: Medicaid Other | Attending: Emergency Medicine | Admitting: Emergency Medicine

## 2018-05-18 ENCOUNTER — Encounter (HOSPITAL_COMMUNITY): Payer: Self-pay | Admitting: Emergency Medicine

## 2018-05-18 ENCOUNTER — Other Ambulatory Visit: Payer: Self-pay

## 2018-05-18 DIAGNOSIS — E119 Type 2 diabetes mellitus without complications: Secondary | ICD-10-CM | POA: Insufficient documentation

## 2018-05-18 DIAGNOSIS — Z87891 Personal history of nicotine dependence: Secondary | ICD-10-CM | POA: Insufficient documentation

## 2018-05-18 DIAGNOSIS — R1033 Periumbilical pain: Secondary | ICD-10-CM | POA: Diagnosis not present

## 2018-05-18 DIAGNOSIS — R110 Nausea: Secondary | ICD-10-CM | POA: Diagnosis not present

## 2018-05-18 DIAGNOSIS — D1723 Benign lipomatous neoplasm of skin and subcutaneous tissue of right leg: Secondary | ICD-10-CM | POA: Diagnosis not present

## 2018-05-18 DIAGNOSIS — I10 Essential (primary) hypertension: Secondary | ICD-10-CM | POA: Diagnosis not present

## 2018-05-18 DIAGNOSIS — R197 Diarrhea, unspecified: Secondary | ICD-10-CM

## 2018-05-18 DIAGNOSIS — N63 Unspecified lump in unspecified breast: Secondary | ICD-10-CM

## 2018-05-18 DIAGNOSIS — Z79899 Other long term (current) drug therapy: Secondary | ICD-10-CM | POA: Insufficient documentation

## 2018-05-18 DIAGNOSIS — Z7901 Long term (current) use of anticoagulants: Secondary | ICD-10-CM | POA: Insufficient documentation

## 2018-05-18 DIAGNOSIS — N6312 Unspecified lump in the right breast, upper inner quadrant: Secondary | ICD-10-CM | POA: Insufficient documentation

## 2018-05-18 DIAGNOSIS — J45909 Unspecified asthma, uncomplicated: Secondary | ICD-10-CM | POA: Diagnosis not present

## 2018-05-18 LAB — URINALYSIS, ROUTINE W REFLEX MICROSCOPIC
Bilirubin Urine: NEGATIVE
GLUCOSE, UA: NEGATIVE mg/dL
Hgb urine dipstick: NEGATIVE
Ketones, ur: NEGATIVE mg/dL
Leukocytes, UA: NEGATIVE
Nitrite: NEGATIVE
Protein, ur: NEGATIVE mg/dL
SPECIFIC GRAVITY, URINE: 1.024 (ref 1.005–1.030)
pH: 5 (ref 5.0–8.0)

## 2018-05-18 LAB — CBC
HEMATOCRIT: 40.6 % (ref 36.0–46.0)
HEMOGLOBIN: 12.5 g/dL (ref 12.0–15.0)
MCH: 27.7 pg (ref 26.0–34.0)
MCHC: 30.8 g/dL (ref 30.0–36.0)
MCV: 89.8 fL (ref 80.0–100.0)
Platelets: 243 10*3/uL (ref 150–400)
RBC: 4.52 MIL/uL (ref 3.87–5.11)
RDW: 13.5 % (ref 11.5–15.5)
WBC: 6.2 10*3/uL (ref 4.0–10.5)
nRBC: 0 % (ref 0.0–0.2)

## 2018-05-18 LAB — COMPREHENSIVE METABOLIC PANEL
ALBUMIN: 3.8 g/dL (ref 3.5–5.0)
ALK PHOS: 70 U/L (ref 38–126)
ALT: 28 U/L (ref 0–44)
ANION GAP: 8 (ref 5–15)
AST: 30 U/L (ref 15–41)
BUN: 13 mg/dL (ref 6–20)
CO2: 26 mmol/L (ref 22–32)
Calcium: 9 mg/dL (ref 8.9–10.3)
Chloride: 104 mmol/L (ref 98–111)
Creatinine, Ser: 0.85 mg/dL (ref 0.44–1.00)
GFR calc Af Amer: >60 mL/min (ref >60)
GFR calc non Af Amer: >60 mL/min (ref >60)
GLUCOSE: 217 mg/dL — AB (ref 70–99)
POTASSIUM: 4 mmol/L (ref 3.5–5.1)
SODIUM: 138 mmol/L (ref 135–145)
Total Bilirubin: 0.3 mg/dL (ref 0.3–1.2)
Total Protein: 7.3 g/dL (ref 6.5–8.1)

## 2018-05-18 LAB — LIPASE, BLOOD: Lipase: 42 U/L (ref 11–51)

## 2018-05-18 MED ORDER — PROMETHAZINE HCL 12.5 MG PO TABS
25.0000 mg | ORAL_TABLET | Freq: Once | ORAL | Status: DC
  Filled 2018-05-18: qty 2

## 2018-05-18 MED ORDER — PROMETHAZINE HCL 25 MG PO TABS: 25.0000 mg | ORAL_TABLET | Freq: Four times a day (QID) | ORAL | 0 refills | Status: DC | PRN

## 2018-05-18 NOTE — Discharge Instructions (Signed)
Return your stool sample to our lab as soon as possible so we can know for sure if you symptoms are from C difficile infection.  Your labs are stable today.  Continue with hydrating as you are doing a good job with this according to your labs.  Finish the antibiotic you were prescribed for your urinary infection at your last visit.  Also, the nodule at the site of your bruising on your right breast appears to be in the superficial skin layer and does not appear to be of deeper breast tissue origin.  However, an outpatient mammogram and ultrsaound study has been ordered for you to have this further evaluated to make sure your breasts are healthy.  You should receive a call from this hospital to set up an appointment for this study.

## 2018-05-18 NOTE — ED Provider Notes (Signed)
[facility] EMERGENCY DEPARTMENT Provider Note   CSN: [id number] Arrival date & time: 05/18/18  4098     History   Chief Complaint Chief Complaint  Patient presents with  . Abdominal Pain    HPI Stacey [name] is a 57 y.o. female with past medical history as outlined below returning due to persistent diarrhea. She was seen here on 10/13 for similar symptoms at which time her lab tests and CT scan was negative for acute intraabdominal process but she did have a uti for which she is currently taking macrobid.  She denies fevers, chills, back pain and weakness, no vomiting but has had nausea.  She describes a burning periumbilical pain which worsens prior to bm's and reports 3-4 bms daily. She is able to maintain po intake and hydration.  She reports having c difficile in the past and todays sx are similar.  She denies bloody stools.  She was prescribed bentyl at her last visit but did not fill as she does not think she is having abdominal spasm.  She also notes finding a nodule in her right upper breast 1 week ago and endorses having a bruise inferior to this site, denies any injury. Nodule is mildly tender.  No nipple drainage.  She is not current with her screening mammograms.  The history is provided by the patient.    Past Medical History:  Diagnosis Date  . Anxiety   . Arthritis   . Asthma   . 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 C. difficile colitis 11/08/2011  . Cyst    pt has "cyst" to legs for years, areas drain at time  . Depression   . Diverticulitis 2016  . DVT (deep venous thrombosis) ([place]) 2014  . Hypertension   . Incisional hernia    in C-section incision  . Musculoskeletal pain 11/08/2011  . Neuropathic pain 11/10/2011  . Pancreatitis 1991  . Pulmonary embolism ([place]) 2014  . Sleep apnea with use of continuous positive airway pressure (CPAP)    uses CPAP at night  . Type 2 diabetes mellitus [facility])     Patient Active Problem List    Diagnosis Date Noted  . Diplopia 12/08/2017  . Carpal tunnel syndrome 12/08/2017  . Lower extremity edema 12/08/2017  . GERD (gastroesophageal reflux disease) 11/08/2017  . History of DVT (deep vein thrombosis) 07/08/2017  . Mixed hyperlipidemia 07/08/2017  . Microalbuminuria 05/24/2017  . Long term (current) use of anticoagulants 05/11/2017  . Eye pain 05/10/2017  . TIA (transient ischemic attack) 11/15/2015  . LVH (left ventricular hypertrophy) due to hypertensive disease, without heart failure 06/20/2014  . Moderate mitral insufficiency 06/20/2014  . PSVT (paroxysmal supraventricular tachycardia) ([place]) 06/20/2014  . History of gastric ulcer 03/04/2014  . Primary osteoarthritis of right hip 03/04/2014  . Pulmonary embolism 07/16/2013  . Type 2 diabetes mellitus without complications ([place]) 11/91/4782  . 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 OSA (obstructive sleep apnea) 10/28/2012  . Neuropathic pain 11/10/2011  . PVC's (premature ventricular contractions) 11/07/2011  . Morbid obesity ([place]) 11/06/2011  . Anxiety state 11/06/2011  . HTN (hypertension) 11/06/2011  . Major depressive disorder, recurrent episode, moderate ([place]) 07/15/2010    Past Surgical History:  Procedure Laterality Date  . CESAREAN SECTION     x2  . CHOLECYSTECTOMY  1991   laproscopic  . NOSE SURGERY  1982  . SKIN BIOPSY    . TONSILLECTOMY  1980     OB History  Gravida  3   Para  2   Term  2   Preterm      AB  1   Living  2     SAB  1   TAB      Ectopic      Multiple      Live Births               Home Medications    Prior to Admission medications   Medication Sig Start Date End Date Taking? Authorizing Provider  buPROPion (WELLBUTRIN XL) 300 MG 24 hr tablet Take 300 mg by mouth daily.    Yes [provider]  busPIRone (BUSPAR) 15 MG tablet Take 2 tablets (30 mg total) by mouth 2 (two) times daily. 08/15/13  Yes Dixon Boos, MD  COUMADIN 10 MG tablet TAKE 1  TABLET BY MOUTH ONCE A DAY 10/16/17  Yes Dew, Erskine Squibb, MD  escitalopram (LEXAPRO) 20 MG tablet Take 20 mg by mouth daily.    Yes [provider]  lisinopril (PRINIVIL,ZESTRIL) 5 MG tablet Take 1 tablet (5 mg total) by mouth daily. 05/24/17  Yes Bacigalupo, Dionne Bucy, MD  LORazepam (ATIVAN) 2 MG tablet Take 1 tablet (2 mg total) by mouth 3 (three) times daily as needed for anxiety. Patient taking differently: Take 1 mg by mouth 2 (two) times daily.  04/05/17  Yes Bacigalupo, Dionne Bucy, MD  nitrofurantoin, macrocrystal-monohydrate, (MACROBID) 100 MG capsule Take 1 capsule (100 mg total) by mouth 2 (two) times daily. 05/13/18  Yes Nat Christen, MD  nystatin cream (MYCOSTATIN) Apply 1 application topically 2 (two) times daily as needed (candidal rash). 12/08/17  Yes Bacigalupo, Dionne Bucy, MD  PROAIR HFA 108 [phone] Base) MCG/ACT inhaler INHALE TWO PUFFS BY MOUTH EVERY FOUR HOURS AS NEEDED FOR SHORTNESS OFBREATH OR WHEEZING 03/07/18  Yes Bacigalupo, Dionne Bucy, MD  promethazine (PHENERGAN) 25 MG tablet Take 1 tablet (25 mg total) by mouth every 6 (six) hours as needed for nausea or vomiting. 05/18/18   Evalee Jefferson, PA-C    Family History Family History  Problem Relation Age of Onset  . Stroke Other   . Cancer Other   . Diabetes Other   . Heart disease Mother   . Diabetes Mother   . Cancer Father        lung  . Diabetes Sister   . Diabetes Brother   . Cancer Maternal Aunt   . Alcohol abuse Maternal Uncle   . Cancer Maternal Grandfather   . Diabetes Brother     Social History Social History   Tobacco Use  . Smoking status: Former Smoker    Years: 2.00    Types: Cigarettes    Last attempt to quit: 2003    Years since quitting: 16.8  . Smokeless tobacco: Never Used  . Tobacco comment: former light cigarette smoker  Substance Use Topics  . Alcohol use: No  . Drug use: No     Allergies   Hydrocodone; Nitroglycerin; Tramadol; Zofran [ondansetron hcl]; Hydromorphone; Levaquin [levofloxacin  in d5w]; Toradol [ketorolac tromethamine]; Shellfish allergy; Levofloxacin; Sulfa antibiotics; Tape; and Trazodone and nefazodone   Review of Systems Review of Systems  Constitutional: Negative for chills and fever.  HENT: Negative for congestion and sore throat.   Eyes: Negative.   Respiratory: Negative for chest tightness and shortness of breath.   Cardiovascular: Negative for chest pain.  Gastrointestinal: Positive for abdominal pain, diarrhea and nausea. Negative for blood in stool and vomiting.  Genitourinary: Negative.   Musculoskeletal: Negative for arthralgias, joint swelling and neck pain.  Skin: Negative.  Negative for rash and wound.  Neurological: Negative for dizziness, weakness, light-headedness, numbness and headaches.  Psychiatric/Behavioral: Negative.      Physical Exam Updated Vital Signs BP (!) 156/70 (BP Location: Right Arm)   Pulse 72   Temp 97.7 F (36.5 C) (Oral)   Resp 18   Ht '5\' 6"'  (1.676 m)   Wt (!) 203 kg   SpO2 98%   BMI 72.24 kg/m   Physical Exam  Constitutional: She appears well-developed and well-nourished. No distress.  Morbidly obese.  HENT:  Head: Normocephalic and atraumatic.  Eyes: Conjunctivae are normal.  Neck: Normal range of motion.  Cardiovascular: Normal rate, regular rhythm, normal heart sounds and intact distal pulses.    Large old appearing bruise right breast. There is a 2 cm freely mobile nodule at the superior edge of the bruised field.  No erythema.  No nipple discharge.   Pulmonary/Chest: Effort normal and breath sounds normal. She has no wheezes.  Abdominal: Soft. Bowel sounds are normal. She exhibits no abdominal bruit. There is tenderness. There is no rigidity, no rebound and no guarding.  Morbidly obese. Difficult abdominal exam due to body habitus.  No guarding.   Musculoskeletal: Normal range of motion.  Neurological: She is alert.  Skin: Skin is warm and dry.  Psychiatric: She has a normal mood and affect.    Nursing note and vitals reviewed.    ED Treatments / Results  Labs (all labs ordered are listed, but only abnormal results are displayed) Labs Reviewed  COMPREHENSIVE METABOLIC PANEL - Abnormal; Notable for the following components:      Result Value   Glucose, Bld 217 (*)    All other components within normal limits  URINALYSIS, ROUTINE W REFLEX MICROSCOPIC - Abnormal; Notable for the following components:   APPearance HAZY (*)    All other components within normal limits  LIPASE, BLOOD  CBC    EKG None  Radiology No results found.  Procedures Procedures (including critical care time)  Medications Ordered in ED Medications - No data to display   Initial Impression / Assessment and Plan / ED Course  I have reviewed the triage vital signs and the nursing notes.  Pertinent labs & imaging results that were available during my care of the patient were reviewed by me and considered in my medical decision making (see chart for details).     Labs reviewed and discussed including labs from visit 5 days ago.  INR was 2.77, therapeutic, not repeated today. She was again unable to provide a stool specimen to test for C dif.  Outpatient orders placed and stool kit given for pt to provide stool sample to our lab.  She was given phenergan for nausea, may add an anti diarrheal once we know her C dif is negative.  Pt understands and agrees with plan.  OP diag mammo and [place] also ordered for screening and diag purposes of the right breast nodule. Exam favoring subcutaneous hematoma/cyst, but she will need definitive exam to r/o breast ca.    Final Clinical Impressions(s) / ED Diagnoses   Final diagnoses:  Diarrhea, unspecified type  Nausea  Subcutaneous nodule of breast    ED Discharge Orders         Ordered    promethazine (PHENERGAN) 25 MG tablet  Every 6 hours PRN     05/18/18 1342    Clostridium Difficile by  PCR([facility])  Status:  Canceled     05/18/18 1350    MM  Digital Diagnostic Unilat R  Status:  Canceled     05/18/18 1357    MM [place]     05/18/18 1405    US BREAST LTD UNI RIGHT INC AXILLA     05/18/18 1405           Evalee Jefferson, PA-C 05/18/18 1446    Orlie Dakin, MD 05/18/18 1615

## 2018-05-18 NOTE — ED Triage Notes (Signed)
All over abd pain and bloating x 3 weeks.  States last time she was here it was c diff.  3 BM with foul odor in the last 24 hours.  Pt also has large bruise with knot underneath on rt breast x 1 week.  Pt denies any injury to breast.

## 2018-05-21 NOTE — ED Notes (Signed)
Pt called requesting order for stool for cdiff.  Pt says she got the sample but lab won't accept it without the order.  Notified Dr. Roderic Palau because ordering provider isn't here.  OK to order and have results sent to J. Idol PA.  Downtime form with order filled out.

## 2018-05-22 ENCOUNTER — Telehealth (INDEPENDENT_AMBULATORY_CARE_PROVIDER_SITE_OTHER): Payer: Self-pay | Admitting: Vascular Surgery

## 2018-05-22 NOTE — Telephone Encounter (Signed)
Patient was being seen at [facility] family practice and she would like to reestablish with Dew to monitor her inr levels.I spoke with Dew and he advise for the patient to be seen in the office and she has appointment for 05/25/18

## 2018-05-23 NOTE — ED Notes (Signed)
PT called to day to the ED asking for her C-diff stool sample results from 05/18/18. No C-diff results from that date available under [facility] on 05/23/18. PT very upset over the telephone and cursing at this nurse. I gave the pt patient experience phone number and informed the patient that if she was having continued symptoms that she she should seek medical examination.

## 2018-05-24 NOTE — ED Notes (Signed)
Pt took specimen to [place]. No specimen located . Spoke to [employer]. Script written for Cdiff to be collected and specimen collection container left at front desk. Pt is aware. Also spoke to scheduler in xray regarding mammogram and  Pt will be notified about appt

## 2018-05-25 ENCOUNTER — Ambulatory Visit (INDEPENDENT_AMBULATORY_CARE_PROVIDER_SITE_OTHER): Payer: Medicaid Other | Admitting: Nurse Practitioner

## 2018-05-28 ENCOUNTER — Ambulatory Visit (INDEPENDENT_AMBULATORY_CARE_PROVIDER_SITE_OTHER): Payer: Medicaid Other | Admitting: Nurse Practitioner

## 2018-05-28 ENCOUNTER — Encounter (INDEPENDENT_AMBULATORY_CARE_PROVIDER_SITE_OTHER): Payer: Self-pay | Admitting: Nurse Practitioner

## 2018-05-28 VITALS — BP 174/71 | HR 67 | Resp 17 | Ht 66.0 in | Wt >= 6400 oz

## 2018-05-28 DIAGNOSIS — Z87891 Personal history of nicotine dependence: Secondary | ICD-10-CM | POA: Diagnosis not present

## 2018-05-28 DIAGNOSIS — N939 Abnormal uterine and vaginal bleeding, unspecified: Secondary | ICD-10-CM

## 2018-05-28 DIAGNOSIS — K219 Gastro-esophageal reflux disease without esophagitis: Secondary | ICD-10-CM

## 2018-05-28 DIAGNOSIS — Z86718 Personal history of other venous thrombosis and embolism: Secondary | ICD-10-CM | POA: Diagnosis not present

## 2018-05-28 MED ORDER — COUMADIN 10 MG PO TABS: 10.0000 mg | ORAL_TABLET | Freq: Every day | ORAL | 3 refills | Status: DC

## 2018-05-28 NOTE — Progress Notes (Signed)
Subjective:    Patient ID: Stacey [name], female    DOB: 13-Oct-1960, 57 y.o.   MRN: [id number] Chief Complaint  Patient presents with  . Follow-up    HPI  Stacey [name] is a 57 y.o. female that is a previous patient that is reestablishing care with [place].  She has a history of DVT and PE.  After which she was placed on Coumadin with long-term anticoagulation.  She states that she was on Xarelto at one point time however there was concern that her weight may be an issue with Xarelto.  She states that she has never had a hypercoagulable panel done.  She also states that she is essentially immobile with short movements from her wheelchair to her bed.  The patient states that her current medication regimen is one 10 mg tablet taken 5 days/week, and a 5 mg tablet taking 2 days/week.  Her last INR was on 05/13/2018 and it was 2.77.  The patient denies any major issues with her Coumadin therapy, however she does endorse that she has significant vaginal bleeding with high INR levels.  She is unsure if she is perimenopausal or postmenopausal.  She has not visited an OB/GYN in over 15 years.  She feels that the bleeding is correlated with the Coumadin levels.  She denies any claudication or chest pain.  She denies any excessive bleeding.  She denies any issues with fever, chills, nausea, vomiting, diarrhea.  Past Medical History:  Diagnosis Date  . Anxiety   . Arthritis   . Asthma   . 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 C. difficile colitis 11/08/2011  . Cyst    pt has "cyst" to legs for years, areas drain at time  . Depression   . Diverticulitis 2016  . DVT (deep venous thrombosis) ([place]) 2014  . Hypertension   . Incisional hernia    in C-section incision  . Musculoskeletal pain 11/08/2011  . Neuropathic pain 11/10/2011  . Pancreatitis 1991  . Pulmonary embolism ([place]) 2014  . Sleep apnea with use of continuous positive airway pressure (CPAP)    uses CPAP at  night  . Type 2 diabetes mellitus ([place])     Past Surgical History:  Procedure Laterality Date  . CESAREAN SECTION     x2  . CHOLECYSTECTOMY  1991   laproscopic  . NOSE SURGERY  1982  . SKIN BIOPSY    . TONSILLECTOMY  1980    Social History   Socioeconomic History  . Marital status: Divorced    Spouse name: Not on file  . Number of children: 2  . Years of education: HS  . Highest education level: Not on file  Occupational History  . Occupation: disability  Social Needs  . Financial resource strain: Not on file  . Food insecurity:    Worry: Not on file    Inability: Not on file  . Transportation needs:    Medical: Not on file    Non-medical: Not on file  Tobacco Use  . Smoking status: Former Smoker    Years: 2.00    Types: Cigarettes    Last attempt to quit: 2003    Years since quitting: 16.8  . Smokeless tobacco: Never Used  . Tobacco comment: former light cigarette smoker  Substance and Sexual Activity  . Alcohol use: No  . Drug use: No  . Sexual activity: Not Currently  Lifestyle  . Physical activity:    Days  per week: Not on file    Minutes per session: Not on file  . Stress: Not on file  Relationships  . Social connections:    Talks on phone: Not on file    Gets together: Not on file    Attends religious service: Not on file    Active member of club or organization: Not on file    Attends meetings of clubs or organizations: Not on file    Relationship status: Not on file  . Intimate partner violence:    Fear of current or ex partner: Not on file    Emotionally abused: Not on file    Physically abused: Not on file    Forced sexual activity: Not on file  Other Topics Concern  . Not on file  Social History Narrative  . Not on file    Family History  Problem Relation Age of Onset  . Stroke Other   . Cancer Other   . Diabetes Other   . Heart disease Mother   . Diabetes Mother   . Cancer Father        lung  . Diabetes Sister   . Diabetes  Brother   . Cancer Maternal Aunt   . Alcohol abuse Maternal Uncle   . Cancer Maternal Grandfather   . Diabetes Brother     Allergies  Allergen Reactions  . Hydrocodone Anaphylaxis  . Nitroglycerin Anxiety  . Tramadol Anaphylaxis and Swelling  . Zofran [Ondansetron Hcl] Anaphylaxis    Panic Attacks  . Hydromorphone Other (See Comments)    Dilaudid. "hallucinations" has tolerated morphine, but "it doesn't work very well"  . Levaquin [Levofloxacin In D5w] Nausea And Vomiting  . Toradol [Ketorolac Tromethamine] Swelling  . Shellfish Allergy     Pt reports she was told by a MD/RN in a previous encounter during a CT scan with contrast that she was possibly allergic to shellfish. Pt reports she gets very sick to her stomach with IV contrast.  . Levofloxacin Other (See Comments)  . Sulfa Antibiotics Rash  . Tape Rash  . Trazodone And Nefazodone Other (See Comments)     Review of Systems   Review of Systems: Negative Unless Checked Constitutional: [] Weight loss  [] Fever  [] Chills Cardiac: [] Chest pain   []  Atrial Fibrillation  [x] Palpitations   [] Shortness of breath when laying flat   [] Shortness of breath with exertion. Vascular:  [] Pain in legs with walking   [] Pain in legs with standing  [] History of DVT   [] Phlebitis   [] Swelling in legs   [] Varicose veins   [] Non-healing ulcers Pulmonary:   [] Uses home oxygen   [] Productive cough   [] Hemoptysis   [] Wheeze  [] COPD   [] Asthma Neurologic:  [] Dizziness   [] Seizures   [] History of stroke   [x] History of TIA  [] Aphasia   [] Vissual changes   [] Weakness or numbness in arm   [] Weakness or numbness in leg Musculoskeletal:   [] Joint swelling   [] Joint pain   [] Low back pain  []  History of Knee Replacement Hematologic:  [] Easy bruising  [] Easy bleeding   [] Hypercoagulable state   [] Anemic Gastrointestinal:  [] Diarrhea   [] Vomiting  [x] Gastroesophageal reflux/heartburn   [] Difficulty swallowing. Genitourinary:  [] Chronic kidney disease    [] Difficult urination  [] Anuric   [] Blood in urine Skin:  [] Rashes   [] Ulcers  Psychological:  [x] History of anxiety   [x]  History of major depression  []  Memory Difficulties     Objective:   Physical Exam  BP Marland Kitchen)  174/71 (BP Location: Right Arm)   Pulse 67   Resp 17   Ht 5\' 6"  (1.676 m)   Wt (!) 446 lb (202.3 kg)   BMI 71.99 kg/m   Gen: WD/WN, NAD, very large body habitus  Head: [place]/AT, No temporalis wasting.  Ear/Nose/Throat: Hearing grossly intact, nares w/o erythema or drainage Eyes: PER, EOMI, sclera nonicteric.  Neck: Supple, no masses.  No JVD.  Pulmonary:  Good air movement, no use of accessory muscles.  Cardiac: RRR Vascular:   Vessel Right Left  Radial Palpable Palpable   Gastrointestinal: soft, non-distended. No guarding/no peritoneal signs.  Musculoskeletal: M/S 5/5 throughout.  No deformity or atrophy.  Neurologic: Pain and light touch intact in extremities.  Symmetrical.  Speech is fluent. Motor exam as listed above. Psychiatric: Judgment intact, Mood & affect appropriate for pt's clinical situation. Dermatologic: No Venous rashes. No Ulcers Noted.  No changes consistent with cellulitis. Lymph : No Cervical lymphadenopathy, no lichenification or skin changes of chronic lymphedema.      Assessment & Plan:   1. History of DVT (deep vein thrombosis) The patient has had a history of DVT and PE.  The patient would ultimately like to be managed via the Coumadin clinic and will try to facilitate that transition.  In the meantime we will check her INR this week as well as refill her Coumadin medication.  Have advised her to continue on her current medication regimen of 10 mg 5 days a week and 5 mg 2 days a week.  Her previous INR was 2.77 on 05/13/2018.  We will follow-up the patient after blood draw.   - COUMADIN 10 MG tablet; Take 1 tablet (10 mg total) by mouth daily.  Dispense: 30 tablet; Refill: 3 - Protime-INR; Future  2. Vaginal bleeding, abnormal The patient  states that she has stopped having menstrual cycles, but states that she has large amounts of bleeding when her INR levels are high. She is unsure if she is peri or post menopausal.  She states that she has not seen an OB/GYN in over 15 years.   I have placed a referral for follow up with OB/GYN.   - Ambulatory referral to Obstetrics / Gynecology  3. Gastroesophageal reflux disease, esophagitis presence not specified Continue PPI as already ordered, this medication has been reviewed and there are no changes at this time.  Avoidence of caffeine and alcohol  Moderate elevation of the head of the bed    Current Outpatient Medications on File Prior to Visit  Medication Sig Dispense Refill  . buPROPion (WELLBUTRIN XL) 300 MG 24 hr tablet Take 300 mg by mouth daily.     . busPIRone (BUSPAR) 15 MG tablet Take 2 tablets (30 mg total) by mouth 2 (two) times daily. 120 tablet 1  . escitalopram (LEXAPRO) 20 MG tablet Take 20 mg by mouth daily.     Marland Kitchen lisinopril (PRINIVIL,ZESTRIL) 5 MG tablet Take 1 tablet (5 mg total) by mouth daily. 90 tablet 3  . LORazepam (ATIVAN) 2 MG tablet Take 1 tablet (2 mg total) by mouth 3 (three) times daily as needed for anxiety. (Patient taking differently: Take 1 mg by mouth 2 (two) times daily. ) 90 tablet 0  . nitrofurantoin, macrocrystal-monohydrate, (MACROBID) 100 MG capsule Take 1 capsule (100 mg total) by mouth 2 (two) times daily. 14 capsule 0  . nystatin cream (MYCOSTATIN) Apply 1 application topically 2 (two) times daily as needed (candidal rash). 60 g 3  . PROAIR HFA 108 (  90 Base) MCG/ACT inhaler INHALE TWO PUFFS BY MOUTH EVERY FOUR HOURS AS NEEDED FOR SHORTNESS OFBREATH OR WHEEZING 8.5 g 5  . promethazine (PHENERGAN) 25 MG tablet Take 1 tablet (25 mg total) by mouth every 6 (six) hours as needed for nausea or vomiting. 30 tablet 0   No current facility-administered medications on file prior to visit.     There are no Patient Instructions on file for this  visit. No follow-ups on file.   Kris Hartmann, NP  This note was completed with Sales executive.  Any errors are purely unintentional.

## 2018-06-06 ENCOUNTER — Telehealth (INDEPENDENT_AMBULATORY_CARE_PROVIDER_SITE_OTHER): Payer: Self-pay | Admitting: Nurse Practitioner

## 2018-06-06 NOTE — Telephone Encounter (Signed)
I left a message informing the patient that the inr order is placed and she can go to the hospital lab to get her inr check

## 2018-06-12 ENCOUNTER — Encounter (HOSPITAL_COMMUNITY): Payer: Medicaid Other

## 2018-06-12 ENCOUNTER — Ambulatory Visit (HOSPITAL_COMMUNITY): Payer: Medicaid Other

## 2018-06-18 ENCOUNTER — Other Ambulatory Visit (INDEPENDENT_AMBULATORY_CARE_PROVIDER_SITE_OTHER): Payer: Self-pay | Admitting: Nurse Practitioner

## 2018-06-18 ENCOUNTER — Other Ambulatory Visit
Admission: RE | Admit: 2018-06-18 | Discharge: 2018-06-18 | Disposition: A | Discharge status: Home / Self Care | Payer: Medicaid Other | Source: Ambulatory Visit | Attending: Nurse Practitioner | Admitting: Nurse Practitioner

## 2018-06-18 DIAGNOSIS — Z86718 Personal history of other venous thrombosis and embolism: Secondary | ICD-10-CM

## 2018-06-18 LAB — PROTIME-INR
INR: 2.2
Prothrombin Time: 24.1 seconds — ABNORMAL HIGH (ref 11.4–15.2)

## 2018-06-18 NOTE — Progress Notes (Signed)
Please inform patient that her INR is 2.20.  She should continue on her current regimen.  We will recheck INR in 2 weeks.

## 2018-06-19 ENCOUNTER — Other Ambulatory Visit: Payer: Self-pay | Admitting: Family Medicine

## 2018-06-19 DIAGNOSIS — N6310 Unspecified lump in the right breast, unspecified quadrant: Secondary | ICD-10-CM

## 2018-07-02 ENCOUNTER — Other Ambulatory Visit: Payer: Medicaid Other

## 2018-07-13 ENCOUNTER — Other Ambulatory Visit: Payer: Medicaid Other

## 2018-07-16 ENCOUNTER — Ambulatory Visit: Payer: Medicaid Other

## 2018-07-16 ENCOUNTER — Other Ambulatory Visit: Payer: Medicaid Other

## 2018-07-19 ENCOUNTER — Other Ambulatory Visit (INDEPENDENT_AMBULATORY_CARE_PROVIDER_SITE_OTHER): Payer: Self-pay | Admitting: Nurse Practitioner

## 2018-07-19 ENCOUNTER — Other Ambulatory Visit
Admission: RE | Admit: 2018-07-19 | Discharge: 2018-07-19 | Disposition: A | Discharge status: Home / Self Care | Payer: Medicaid Other | Source: Ambulatory Visit | Attending: Nurse Practitioner | Admitting: Nurse Practitioner

## 2018-07-19 DIAGNOSIS — Z86718 Personal history of other venous thrombosis and embolism: Secondary | ICD-10-CM | POA: Insufficient documentation

## 2018-07-19 LAB — PROTIME-INR
INR: 2.04
Prothrombin Time: 22.8 seconds — ABNORMAL HIGH (ref 11.4–15.2)

## 2018-07-19 NOTE — Progress Notes (Signed)
Could you give Ms. Seufert a call and let her know that her INR is within range.  She can continue taking her current dose.  She can re-check in one month.

## 2018-08-09 ENCOUNTER — Other Ambulatory Visit
Admission: RE | Admit: 2018-08-09 | Discharge: 2018-08-09 | Disposition: A | Discharge status: Home / Self Care | Payer: Medicaid Other | Source: Ambulatory Visit | Attending: Nurse Practitioner | Admitting: Nurse Practitioner

## 2018-08-09 ENCOUNTER — Other Ambulatory Visit (INDEPENDENT_AMBULATORY_CARE_PROVIDER_SITE_OTHER): Payer: Self-pay | Admitting: Nurse Practitioner

## 2018-08-09 DIAGNOSIS — Z86718 Personal history of other venous thrombosis and embolism: Secondary | ICD-10-CM

## 2018-08-09 LAB — PROTIME-INR
INR: 1.73
PROTHROMBIN TIME: 20 s — AB (ref 11.4–15.2)

## 2018-08-09 NOTE — Progress Notes (Signed)
Your INR was low this time. Take one additional half tablet (5 mg) with next dose and then continue with normal dose daily.  We will have you check in 2 weeks to ensure that we are again therapeutic.

## 2018-08-24 ENCOUNTER — Telehealth (INDEPENDENT_AMBULATORY_CARE_PROVIDER_SITE_OTHER): Payer: Self-pay

## 2018-08-24 NOTE — Telephone Encounter (Signed)
Called and spoke with pt and she states she is sure these were old vms and I informed her that these were from when she tried to reach her in regards to her lab results but they have already spoken about it. She understood and stated she will try to go and get her blood drawn today. She wanted to know if we would be able to call her back today with the results, informed pt that this might not be possible depending on lab and plus that Arna Medici is currently seeing pt. She understood and had no additional questions at this time.

## 2018-08-24 NOTE — Telephone Encounter (Signed)
-----   Message from Carlena Bjornstad sent at 08/24/2018 11:05 AM EST ----- Regarding: phone call This patient called returning a phone call from [place]. She said Loma Sousa called her 3 times yesterday. She stated if you call back and she doesn't answer you can leave a message as to why you are calling. [phone].  Thanks!

## 2018-09-04 ENCOUNTER — Telehealth (INDEPENDENT_AMBULATORY_CARE_PROVIDER_SITE_OTHER): Payer: Self-pay | Admitting: Nurse Practitioner

## 2018-09-04 ENCOUNTER — Other Ambulatory Visit
Admission: RE | Admit: 2018-09-04 | Discharge: 2018-09-04 | Disposition: A | Discharge status: Home / Self Care | Payer: Medicaid Other | Source: Ambulatory Visit | Attending: Nurse Practitioner | Admitting: Nurse Practitioner

## 2018-09-04 DIAGNOSIS — Z86718 Personal history of other venous thrombosis and embolism: Secondary | ICD-10-CM | POA: Insufficient documentation

## 2018-09-04 LAB — PROTIME-INR
INR: 1.87
PROTHROMBIN TIME: 21.3 s — AB (ref 11.4–15.2)

## 2018-09-04 NOTE — Telephone Encounter (Signed)
Spoke with pt and informed her of Fallon's message. Pt understood and had no additional questions at this time.

## 2018-09-04 NOTE — Telephone Encounter (Signed)
Jasmine already spoke with the patient at 3: 15

## 2018-09-04 NOTE — Telephone Encounter (Signed)
Attempted to reach pt, phone rang a fast busy, will need to try to call back

## 2018-09-27 ENCOUNTER — Other Ambulatory Visit
Admission: RE | Admit: 2018-09-27 | Discharge: 2018-09-27 | Disposition: A | Discharge status: Home / Self Care | Payer: Medicaid Other | Attending: Nurse Practitioner | Admitting: Nurse Practitioner

## 2018-09-27 ENCOUNTER — Telehealth (INDEPENDENT_AMBULATORY_CARE_PROVIDER_SITE_OTHER): Payer: Self-pay | Admitting: Nurse Practitioner

## 2018-09-27 DIAGNOSIS — Z86718 Personal history of other venous thrombosis and embolism: Secondary | ICD-10-CM | POA: Diagnosis not present

## 2018-09-27 LAB — PROTIME-INR
INR: 1.7 — ABNORMAL HIGH (ref 0.8–1.2)
PROTHROMBIN TIME: 19.5 s — AB (ref 11.4–15.2)

## 2018-09-27 NOTE — Progress Notes (Signed)
INR is 1.7, so even lower than last week.  Please reconfirm with patient that she is taking 10mg  daily (1 full tab) with an additional 5 mg (1/2 tab) on Wednesdays.    If she is, please have her to begin taking an additional 5 mg (1/2 tab) Tuesdays as well.    If she is not, have her start taking the additional 5 mg on Wednesdays.  Recheck in 2 weeks.

## 2018-09-27 NOTE — Telephone Encounter (Signed)
Patient can stay on Keto diet and for shortness of breathe patient should contact pcp or she can go to er or urgent if it continues

## 2018-09-27 NOTE — Progress Notes (Signed)
Patient had stated she is taking 10mg  for five days and 5mg  for two days of coumadin.I inform her to start taking 10mg  everyday and additional 5mg  on Wednesdays and recheck in 2 weeks

## 2018-10-03 ENCOUNTER — Telehealth (INDEPENDENT_AMBULATORY_CARE_PROVIDER_SITE_OTHER): Payer: Self-pay | Admitting: Nurse Practitioner

## 2018-10-03 NOTE — Telephone Encounter (Deleted)
.  PAT

## 2018-10-03 NOTE — Telephone Encounter (Signed)
Since we are seeing her for management of her chronic anticoagulation, we don't necessarily need regular office visits as long as there are no major changes.  We can see her 6 months from her last visit, which would be in late April or early may.

## 2018-10-03 NOTE — Telephone Encounter (Signed)
Patient would like to know if she needs to come in anytime to be evaluated as she has not been seen recently. Please advise.

## 2018-10-03 NOTE — Telephone Encounter (Signed)
Sania is aware of the information below. She request that someone call her tomorrow to help her schedule an apt because she is leaving her home for another apt.   Can you please call [facility] on 10/04/18 to schedule a follow up visit. Thank you. AS, CMA

## 2018-10-03 NOTE — Telephone Encounter (Signed)
Please advise. AS, CMA 

## 2018-10-09 ENCOUNTER — Other Ambulatory Visit (INDEPENDENT_AMBULATORY_CARE_PROVIDER_SITE_OTHER): Payer: Self-pay | Admitting: Nurse Practitioner

## 2018-10-09 DIAGNOSIS — Z86718 Personal history of other venous thrombosis and embolism: Secondary | ICD-10-CM

## 2018-10-12 ENCOUNTER — Other Ambulatory Visit (INDEPENDENT_AMBULATORY_CARE_PROVIDER_SITE_OTHER): Payer: Self-pay | Admitting: Nurse Practitioner

## 2018-10-12 DIAGNOSIS — Z86718 Personal history of other venous thrombosis and embolism: Secondary | ICD-10-CM

## 2018-10-15 ENCOUNTER — Other Ambulatory Visit
Admission: RE | Admit: 2018-10-15 | Discharge: 2018-10-15 | Disposition: A | Discharge status: Home / Self Care | Payer: Medicaid Other | Source: Ambulatory Visit | Attending: Nurse Practitioner | Admitting: Nurse Practitioner

## 2018-10-15 DIAGNOSIS — Z86718 Personal history of other venous thrombosis and embolism: Secondary | ICD-10-CM | POA: Diagnosis not present

## 2018-10-15 LAB — PROTIME-INR
INR: 1.8 — AB (ref 0.8–1.2)
Prothrombin Time: 20.8 seconds — ABNORMAL HIGH (ref 11.4–15.2)

## 2018-10-15 NOTE — Progress Notes (Signed)
Please get the patient to detail exactly what she is taking daily.  Here is what she should be taking.   One tablet daily (10mg ) and an ADDITIONAL 1/2 tablet (5mg  ) on Wednesdays.   If she is taking her medicine as described, please have her take an additional 5mg  (1/2 tab on Tuesday).  She should continue to do this on Wednesday as well.    If she is not taking it as she should, please let me know and will adjust based on what she is taking.

## 2018-10-16 NOTE — Progress Notes (Signed)
Patient stated that she is taking 10mg  6 out the 7days.I informed her Arna Medici instructions to take 10mg  everyday and additional 5mg  on Tuesdays and recheck in 2weeks

## 2018-11-01 ENCOUNTER — Telehealth (INDEPENDENT_AMBULATORY_CARE_PROVIDER_SITE_OTHER): Payer: Self-pay

## 2018-11-01 NOTE — Telephone Encounter (Signed)
Patient left a message inquiring about getting her inr check. I spoke with Arna Medici and she advise some options such as she can go to hospital to the lab,wait to see if insurance will approve self home inr because it was denied and we resubmit to insurance,referral to anticoagulant office,or she can stop taking coumadin because she has not had a dvt in 5 years.The patient stated that she does not want to stop taking coumadin because she is concern with getting a dvt because she is in a wheelchair.

## 2018-11-14 ENCOUNTER — Ambulatory Visit: Payer: Self-pay | Admitting: *Deleted

## 2018-11-14 NOTE — Telephone Encounter (Signed)
Patient has talked to [facility] and PCP and she was told to call for testing. Patient is closest to [facility].  Patient is having trouble to her breathing when she moves at all. She has cough and she had cold chills. Patient states she has been advised to go to the hospital- but she is not going if they do not have a drive up tent.  Call to ED to see if they can possibly convince patient to come in to get checked. ED took call to try to convince her to come.   Additional Information . Commented on: ED call to PCP    Call to ED to try to get patient to come to ED- she is fearful and does not want to go- patient has already been advised to go.  Answer Assessment - Initial Assessment Questions 1. REASON FOR CALL or QUESTION: "What is your reason for calling today?" or "How can I best help you?" or "What question do you have that I can help answer?"     Patient has been advised by [facility] and PCP to go to ED- she does not want to go if they do not have tent. 2. CALLER: Document the source of call. (e.g., laboratory, patient).     patient  Protocols used: PCP CALL - NO TRIAGE-A-AH

## 2018-11-19 ENCOUNTER — Telehealth (INDEPENDENT_AMBULATORY_CARE_PROVIDER_SITE_OTHER): Payer: Self-pay | Admitting: Vascular Surgery

## 2018-11-20 NOTE — Telephone Encounter (Signed)
I reached back out to the company and they state that Medicaid does not cover home INR checks so it is not eligible for appeal based possible on the pandemic.

## 2018-11-20 NOTE — Telephone Encounter (Signed)
Currently we have not heard back from the company about re-applying for the in home INR.  Otherwise, she will need to go to the hospital.

## 2018-11-20 NOTE — Telephone Encounter (Signed)
I called the patient and inform her with Arna Medici NP medical advice and she stated that she is not going to hospital and will check to see if her pcp will check her inr

## 2018-11-26 ENCOUNTER — Ambulatory Visit (INDEPENDENT_AMBULATORY_CARE_PROVIDER_SITE_OTHER): Payer: Medicaid Other | Admitting: Nurse Practitioner

## 2018-12-27 ENCOUNTER — Ambulatory Visit (INDEPENDENT_AMBULATORY_CARE_PROVIDER_SITE_OTHER): Payer: Medicaid Other | Admitting: Nurse Practitioner

## 2019-03-07 ENCOUNTER — Other Ambulatory Visit (INDEPENDENT_AMBULATORY_CARE_PROVIDER_SITE_OTHER): Payer: Self-pay | Admitting: Nurse Practitioner

## 2019-03-07 DIAGNOSIS — Z86718 Personal history of other venous thrombosis and embolism: Secondary | ICD-10-CM

## 2019-06-15 ENCOUNTER — Ambulatory Visit
Admission: EM | Admit: 2019-06-15 | Discharge: 2019-06-15 | Disposition: A | Discharge status: Home / Self Care | Payer: Medicaid Other | Attending: Physician Assistant | Admitting: Physician Assistant

## 2019-06-15 ENCOUNTER — Encounter: Payer: Self-pay | Admitting: Emergency Medicine

## 2019-06-15 ENCOUNTER — Other Ambulatory Visit: Payer: Self-pay

## 2019-06-15 DIAGNOSIS — R21 Rash and other nonspecific skin eruption: Secondary | ICD-10-CM

## 2019-06-15 DIAGNOSIS — H1131 Conjunctival hemorrhage, right eye: Secondary | ICD-10-CM

## 2019-06-15 MED ORDER — TRIAMCINOLONE ACETONIDE 0.1 % EX CREA: 1.0000 "application " | TOPICAL_CREAM | Freq: Two times a day (BID) | CUTANEOUS | 0 refills | Status: DC

## 2019-06-15 NOTE — Discharge Instructions (Signed)
No alarming signs on exam.  Your vision was 20/20 in both eyes without glasses. Continue to monitor, and avoid straining.  Patient resolved in 1 to 2 weeks.  If noticed spreading to the iris, blurry vision, eye pain, sudden loss of vision, please go to the ED/eye doctor immediately for further evaluation needed.   Start triamcinolone as directed for your rash.  Avoid scratching.  Make sure to put on lotion for dry skin.  Follow-up with PCP for further evaluation if symptoms not improving.

## 2019-06-15 NOTE — ED Provider Notes (Addendum)
[name]    CSN: [id number] Arrival date & time: 06/15/19  1539      History   Chief Complaint Chief Complaint  Patient presents with  . Conjunctivitis    HPI Stacey [name] is a 58 y.o. female.   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  Patient states she drove to [facility] to visit her son, and son noticed redness to the right eye.  Denies pain, irritation, vision changes, photophobia, eye drainage.  Given currently on Coumadin, came in for evaluation.  Patient denies any eye injury/trauma.  Has had some straining getting up and down from chair.  Last INR 3 weeks ago, within goal. Denies other bleeding such as melena, hematochezia, hematuria. Denies gum bleeding.   Patient also complains of left forearm itching/rash for the past few weeks.  Has not tried anything for the symptoms.  Feels that the rash is spreading.  Denies any obvious aggravating or alleviating factor.  Denies changes in hygiene products.  Denies erythema, warmth, pain.  Denies fever, chills, night sweats.  Has not tried anything for the symptoms.     Past Medical History:  Diagnosis Date  . Anxiety   . Arthritis   . Asthma   . 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 C. difficile colitis 11/08/2011  . Cyst    pt has "cyst" to legs for years, areas drain at time  . Depression   . Diverticulitis 2016  . DVT (deep venous thrombosis) ([place]) 2014  . Hypertension   . Incisional hernia    in C-section incision  . Musculoskeletal pain 11/08/2011  . Neuropathic pain 11/10/2011  . Pancreatitis 1991  . Pulmonary embolism ([place]) 2014  . Sleep apnea with use of continuous positive airway pressure (CPAP)    uses CPAP at night  . Type 2 diabetes mellitus [facility])     Patient Active Problem List   Diagnosis Date Noted  . Vaginal bleeding, abnormal 05/28/2018  . Diplopia 12/08/2017  . Carpal tunnel syndrome  12/08/2017  . Lower extremity edema 12/08/2017  . GERD (gastroesophageal reflux disease) 11/08/2017  . History of DVT (deep vein thrombosis) 07/08/2017  . Mixed hyperlipidemia 07/08/2017  . Microalbuminuria 05/24/2017  . Long term (current) use of anticoagulants 05/11/2017  . Eye pain 05/10/2017  . TIA (transient ischemic attack) 11/15/2015  . LVH (left ventricular hypertrophy) due to hypertensive disease, without heart failure 06/20/2014  . Moderate mitral insufficiency 06/20/2014  . PSVT (paroxysmal supraventricular tachycardia) ([place]) 06/20/2014  . History of gastric ulcer 03/04/2014  . Primary osteoarthritis of right hip 03/04/2014  . Pulmonary embolism 07/16/2013  . Type 2 diabetes mellitus without complications ([place]) 0000000  . 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 OSA (obstructive sleep apnea) 10/28/2012  . Neuropathic pain 11/10/2011  . PVC's (premature ventricular contractions) 11/07/2011  . Morbid obesity ([place]) 11/06/2011  . Anxiety state 11/06/2011  . HTN (hypertension) 11/06/2011  . Major depressive disorder, recurrent episode, moderate ([place]) 07/15/2010    Past Surgical History:  Procedure Laterality Date  . CESAREAN SECTION     x2  . CHOLECYSTECTOMY  1991   laproscopic  . NOSE SURGERY  1982  . SKIN BIOPSY    . TONSILLECTOMY  1980    OB History    Gravida  3   Para  2   Term  2   Preterm      AB  1   Living  2     SAB  1   TAB      Ectopic      Multiple      Live Births               Home Medications    Prior to Admission medications   Medication Sig Start Date End Date Taking? Authorizing Provider  PROAIR HFA 108 (90 Base) MCG/ACT inhaler INHALE TWO PUFFS BY MOUTH EVERY FOUR HOURS AS NEEDED FOR SHORTNESS OFBREATH OR WHEEZING 03/07/18  Yes Bacigalupo, Dionne Bucy, MD  buPROPion (WELLBUTRIN XL) 300 MG 24 hr tablet Take 300 mg by mouth daily.     [provider]  busPIRone (BUSPAR) 15 MG tablet Take 2 tablets (30 mg total) by mouth 2  (two) times daily. 08/15/13   Dixon Boos, MD  escitalopram (LEXAPRO) 20 MG tablet Take 20 mg by mouth daily.     [provider]  LORazepam (ATIVAN) 2 MG tablet Take 1 tablet (2 mg total) by mouth 3 (three) times daily as needed for anxiety. Patient taking differently: Take 1 mg by mouth 2 (two) times daily.  04/05/17   Bacigalupo, Dionne Bucy, MD  nystatin cream (MYCOSTATIN) Apply 1 application topically 2 (two) times daily as needed (candidal rash). 12/08/17   Virginia Crews, MD  triamcinolone cream (KENALOG) 0.1 % Apply 1 application topically 2 (two) times daily. 06/15/19   Tasia Catchings, Aspen Deterding V, PA-C  warfarin (COUMADIN) 10 MG tablet TAKE 1 TABLET BY MOUTH ONCE DAILY 10/17/18   Kris Hartmann, NP  lisinopril (PRINIVIL,ZESTRIL) 5 MG tablet Take 1 tablet (5 mg total) by mouth daily. 05/24/17 06/15/19  Virginia Crews, MD  promethazine (PHENERGAN) 25 MG tablet Take 1 tablet (25 mg total) by mouth every 6 (six) hours as needed for nausea or vomiting. 05/18/18 06/15/19  Evalee Jefferson, PA-C    Family History Family History  Problem Relation Age of Onset  . Stroke Other   . Cancer Other   . Diabetes Other   . Heart disease Mother   . Diabetes Mother   . Cancer Father        lung  . Diabetes Sister   . Diabetes Brother   . Cancer Maternal Aunt   . Alcohol abuse Maternal Uncle   . Cancer Maternal Grandfather   . Diabetes Brother     Social History Social History   Tobacco Use  . Smoking status: Former Smoker    Years: 2.00    Types: Cigarettes    Quit date: 2003    Years since quitting: 17.8  . Smokeless tobacco: Never Used  . Tobacco comment: former light cigarette smoker  Substance Use Topics  . Alcohol use: No  . Drug use: No     Allergies   Hydrocodone, Nitroglycerin, Tramadol, Zofran [ondansetron hcl], Hydromorphone, Levaquin [levofloxacin in d5w], Toradol [ketorolac tromethamine], Shellfish allergy, Levofloxacin, Sulfa antibiotics, Tape, and Trazodone and  nefazodone   Review of Systems Review of Systems  Reason unable to perform ROS: See HPI as above.     Physical Exam Triage Vital Signs ED Triage Vitals [06/15/19 1549]  Enc Vitals Group     BP 129/82     Pulse Rate 64     Resp 18     Temp 98.1 F (36.7 C)     Temp Source Temporal     SpO2 97 %     Weight      Height      Head Circumference  Peak Flow      Pain Score 4     Pain Loc      Pain Edu?      Excl. in [place]?    No data found.  Updated Vital Signs BP 129/82 (BP Location: Left Arm)   Pulse 64   Temp 98.1 F (36.7 C) (Temporal)   Resp 18   SpO2 97%   Visual Acuity Right Eye Distance:   Left Eye Distance:   Bilateral Distance:    Right Eye Near: R Near: 20/20 Left Eye Near:  L Near: 20/20 Bilateral Near:  20/20  Physical Exam Constitutional:      General: She is not in acute distress.    Appearance: She is well-developed. She is obese. She is not diaphoretic.  HENT:     Head: Normocephalic and atraumatic.     Mouth/Throat:     Mouth: Mucous membranes are moist.     Pharynx: Oropharynx is clear. Uvula midline.     Comments: No gum bleeding.  Eyes:     General: Lids are normal.     Pupils: Pupils are equal, round, and reactive to light.     Comments: Subconjunctival hemorrhage to the nasal aspect of right eye.  Hemorrhage does not extend to eye rest.  No hyphema noted on exam.  No conjunctival/scleral injection.  No photophobia on exam.  Full EOM.  PERRLA.  Cardiovascular:     Rate and Rhythm: Normal rate and regular rhythm.     Heart sounds: No murmur. No friction rub. No gallop.   Pulmonary:     Effort: Pulmonary effort is normal. No respiratory distress.     Comments: Speaking in full sentences without difficulty.  Lungs clear to auscultation bilaterally without adventitious lung sounds. Skin:    Comments: Left forearm with dry skin/flaking.  Few maculopapular rash to the area.  No erythema, warmth.  No tenderness to palpation.   Neurological:     Mental Status: She is alert and oriented to person, place, and time.      UC Treatments / Results  Labs (all labs ordered are listed, but only abnormal results are displayed) Labs Reviewed - No data to display  EKG   Radiology No results found.  Procedures Procedures (including critical care time)  Medications Ordered in UC Medications - No data to display  Initial Impression / Assessment and Plan / UC Course  I have reviewed the triage vital signs and the nursing notes.  Pertinent labs & imaging results that were available during my care of the patient were reviewed by me and considered in my medical decision making (see chart for details).    History and exam consistent with subconjunctival hemorrhage.  No signs of hyphema, conjunctival injection.  No photophobia, vision changes.  At this time patient to continue to monitor, avoid straining.  Continue to monitor.  Return precautions given.  Will provide triamcinolone cream for rash, and patient to avoid new hygiene product changes.  Return precautions given.  Patient expresses understanding and agrees to plan.  Final Clinical Impressions(s) / UC Diagnoses   Final diagnoses:  Subconjunctival hemorrhage of right eye  Rash    ED Prescriptions    Medication Sig Dispense Auth. Provider   triamcinolone cream (KENALOG) 0.1 % Apply 1 application topically 2 (two) times daily. 30 g Ok Edwards, PA-C     PDMP not reviewed this encounter.   Ok Edwards, PA-C 06/15/19 1927    Ok Edwards, PA-C  06/16/19 0800  

## 2019-06-15 NOTE — ED Notes (Signed)
Patient able to ambulate independently  

## 2019-06-15 NOTE — ED Triage Notes (Addendum)
Pt presents to [facility] for assessment of inner right eye redness starting 30 minutes ago.  Pt is on Warfarin

## 2019-06-26 ENCOUNTER — Other Ambulatory Visit: Payer: Self-pay | Admitting: Acute Care

## 2019-06-26 DIAGNOSIS — M4722 Other spondylosis with radiculopathy, cervical region: Secondary | ICD-10-CM

## 2019-07-10 ENCOUNTER — Ambulatory Visit: Admission: RE | Admit: 2019-07-10 | Payer: Medicaid Other | Source: Ambulatory Visit

## 2019-11-15 ENCOUNTER — Other Ambulatory Visit: Payer: Self-pay | Admitting: Family Medicine

## 2019-11-15 DIAGNOSIS — M79662 Pain in left lower leg: Secondary | ICD-10-CM

## 2019-11-15 DIAGNOSIS — M79605 Pain in left leg: Secondary | ICD-10-CM

## 2019-11-19 ENCOUNTER — Other Ambulatory Visit: Payer: Self-pay

## 2019-11-19 ENCOUNTER — Ambulatory Visit
Admission: RE | Admit: 2019-11-19 | Discharge: 2019-11-19 | Disposition: A | Discharge status: Home / Self Care | Payer: Medicaid Other | Source: Ambulatory Visit | Attending: Family Medicine | Admitting: Family Medicine

## 2019-11-19 DIAGNOSIS — M79662 Pain in left lower leg: Secondary | ICD-10-CM | POA: Diagnosis present

## 2019-11-19 DIAGNOSIS — M79605 Pain in left leg: Secondary | ICD-10-CM | POA: Insufficient documentation

## 2020-05-19 ENCOUNTER — Inpatient Hospital Stay: Payer: Medicaid Other

## 2020-05-19 ENCOUNTER — Inpatient Hospital Stay: Payer: Medicaid Other | Admitting: Internal Medicine

## 2020-06-03 ENCOUNTER — Encounter: Payer: Self-pay | Admitting: *Deleted

## 2020-06-04 ENCOUNTER — Inpatient Hospital Stay: Payer: Medicaid Other

## 2020-06-04 ENCOUNTER — Inpatient Hospital Stay: Payer: Medicaid Other | Attending: Internal Medicine | Admitting: Internal Medicine

## 2020-11-26 ENCOUNTER — Telehealth: Payer: Self-pay

## 2020-11-26 NOTE — Telephone Encounter (Signed)
Patients would like to have a EDG due to present throat issue. Does not prefer to have a colon screening without having the upper procedure. Referral only specifies colon. Tried to make an office appt for patient to consullt about her throat issues. Patient refused due to appt being in June.

## 2020-12-30 ENCOUNTER — Telehealth: Payer: Self-pay | Admitting: Radiology

## 2020-12-30 ENCOUNTER — Encounter: Payer: Self-pay | Admitting: Radiology

## 2020-12-30 NOTE — Telephone Encounter (Signed)
Left message for patient to call [facility] to schedule New Patient appointment from referral from [facility] for pap and pelvic exam, medical records scanned.

## 2021-01-05 ENCOUNTER — Telehealth: Payer: Self-pay | Admitting: Hematology and Oncology

## 2021-01-05 NOTE — Telephone Encounter (Signed)
Scheduled appt per 5/31 referral. Pt aware.

## 2021-01-14 ENCOUNTER — Inpatient Hospital Stay: Payer: Medicaid Other

## 2021-01-14 ENCOUNTER — Inpatient Hospital Stay: Payer: Medicaid Other | Attending: Hematology and Oncology | Admitting: Hematology and Oncology

## 2021-01-14 ENCOUNTER — Encounter: Payer: Self-pay | Admitting: Hematology and Oncology

## 2021-01-14 ENCOUNTER — Other Ambulatory Visit: Payer: Self-pay

## 2021-01-14 VITALS — BP 145/67 | HR 49 | Resp 18 | Ht 66.0 in | Wt 212.1 lb

## 2021-01-14 DIAGNOSIS — Z86711 Personal history of pulmonary embolism: Secondary | ICD-10-CM | POA: Diagnosis not present

## 2021-01-14 DIAGNOSIS — E119 Type 2 diabetes mellitus without complications: Secondary | ICD-10-CM | POA: Diagnosis not present

## 2021-01-14 DIAGNOSIS — G4730 Sleep apnea, unspecified: Secondary | ICD-10-CM

## 2021-01-14 DIAGNOSIS — I10 Essential (primary) hypertension: Secondary | ICD-10-CM | POA: Diagnosis not present

## 2021-01-14 DIAGNOSIS — Z86718 Personal history of other venous thrombosis and embolism: Secondary | ICD-10-CM

## 2021-01-14 DIAGNOSIS — Z7901 Long term (current) use of anticoagulants: Secondary | ICD-10-CM | POA: Insufficient documentation

## 2021-01-14 DIAGNOSIS — Z79899 Other long term (current) drug therapy: Secondary | ICD-10-CM | POA: Insufficient documentation

## 2021-01-14 DIAGNOSIS — E669 Obesity, unspecified: Secondary | ICD-10-CM | POA: Diagnosis not present

## 2021-01-14 LAB — ANTITHROMBIN III: AntiThromb III Func: 112 % (ref 75–120)

## 2021-01-14 NOTE — Progress Notes (Signed)
[place] CONSULT NOTE  Patient Care Team: [name], NP as PCP - General (Nurse Practitioner)  CHIEF COMPLAINTS/PURPOSE OF CONSULTATION:  History of PE, anticoagulation recommendations.  ASSESSMENT & PLAN:   This is a very pleasant 60 year old female patient with past medical history significant for COPD, obesity, history of PE referred to hematology for anticoagulation recommendations.  Back in 2014, she was found to have right lower extremity DVT as well as a questionable PE and since then she has been on anticoagulation with Coumadin.  She denies any new health complaints, she has managed to lose about 200 pounds of weight on her own and is hoping for some skin surgery to help the flabby skin.  She did not have any bleeding complications with Coumadin but was hoping to discontinue anticoagulation if her risk of clotting is not considerably high.  She had other surgeries, had 2 pregnancies and did not have any antepartum or postpartum DVT/PE.  No significant family history of DVT/PE. I have discussed that since this is the only episode of unprovoked DVT with questionable PE back at that time, it is not necessary to continue indefinite anticoagulation in this case.  She is interested in proceeding with hypercoagulable work-up which has been ordered.  Have recommended that she continue anticoagulation until her follow-up visit.  We have discussed about symptoms and signs of DVT/PE, risk factors for DVT/PE and need to go to the nearest hospital with any symptoms concerning for recurrence. She expressed understanding of the recommendations, she is agreeable for hypercoagulable work-up and video visit. Thank you for consulting [place] in the care of this patient.  Please do not hesitate to contact us with any additional questions or concerns.  HISTORY OF PRESENTING ILLNESS:   Stacey [name] 60 y.o. female is here because of History of PE  Stacey [name] is a 60 yr old female  patient with PMH significant for COPD, pancreatitis, obesity and history of PE referred to hematology for evaluation of anticoagulation recommendations.  Stacey [name] remembers history of having a DVT and PE back in 2014 or 2015 and since then has been on anticoagulation with warfarin.  She does not remember any further episodes of DVT/PE.  In July 12 2013, patient presented with erythema and swelling involving the right thigh which showed near occlusive thrombus consistent with acute DVT in the central portion of the femoral vein. CT with PE protocol showed single questionable pulmonary embolus in right lower lobe pulmonary artery. She was started on anticoagulation back then.  No bleeding complications.  She says she chose Coumadin because of easy reversal with any bleeding complications.  She otherwise had 2 kids, no antepartum or postpartum DVT/PE.  She had other surgeries with no perioperative thromboembolic complications.  She does remember her grandmother being on warfarin.  She also remembers her maternal aunt who had some issues with varicosities but no major blood clots. Rest of the pertinent 10 point ROS reviewed and negative.   MEDICAL HISTORY:  Past Medical History:  Diagnosis Date   Acid reflux    Anxiety    Arthritis    Asthma    Benign lipomatous neoplasm of skin, subcu of right leg 02/17/2013   Bilateral knee pain    C. difficile colitis 11/08/2011   Cervicalgia    Chronic hip pain    Chronic thoracic spine pain    Chronic, continuous use of opioids    COPD (chronic obstructive pulmonary disease) (HCC)    Cyst  pt has "cyst" to legs for years, areas drain at time   Depression    Diverticulitis 2016   DVT (deep venous thrombosis) ([place]) 2014   Hair loss    Hypertension    Incisional hernia    in C-section incision   Lumbar spine pain    Musculoskeletal pain 11/08/2011   Neuropathic pain 11/10/2011   Pancreatitis 1991   Pulmonary embolism ([place]) 2014   Sleep apnea  with use of continuous positive airway pressure (CPAP)    uses CPAP at night   Type 2 diabetes mellitus (HCC)    Vitamin D deficiency    Warfarin anticoagulation     SURGICAL HISTORY: Past Surgical History:  Procedure Laterality Date   CESAREAN SECTION     x2   CHOLECYSTECTOMY  1991   laproscopic   NOSE SURGERY  1982   SKIN BIOPSY     TONSILLECTOMY  1980    SOCIAL HISTORY: Social History   Socioeconomic History   Marital status: Divorced    Spouse name: Not on file   Number of children: 2   Years of education: HS   Highest education level: Not on file  Occupational History   Occupation: disability  Tobacco Use   Smoking status: Former    Years: 2.00    Pack years: 0.00    Types: Cigarettes    Quit date: 2003    Years since quitting: 19.4   Smokeless tobacco: Never   Tobacco comments:    former light cigarette smoker  Scientific laboratory technician Use: Never used  Substance and Sexual Activity   Alcohol use: No   Drug use: No   Sexual activity: Not Currently  Other Topics Concern   Not on file  Social History Narrative   Not on file   Social Determinants of Health   Financial Resource Strain: Not on file  Food Insecurity: Not on file  Transportation Needs: Not on file  Physical Activity: Not on file  Stress: Not on file  Social Connections: Not on file  Intimate Partner Violence: Not on file    FAMILY HISTORY: Family History  Problem Relation Age of Onset   Stroke Other    Cancer Other    Diabetes Other    Heart disease Mother    Diabetes Mother    Cancer Father        lung   Diabetes Sister    Diabetes Brother    Cancer Maternal Aunt    Alcohol abuse Maternal Uncle    Cancer Maternal Grandfather    Diabetes Brother     ALLERGIES:  is allergic to hydrocodone, nitroglycerin, tramadol, zofran [ondansetron hcl], hydromorphone, levaquin [levofloxacin in d5w], toradol [ketorolac tromethamine], shellfish allergy, levofloxacin, sulfa antibiotics, tape,  and trazodone and nefazodone.  MEDICATIONS:  Current Outpatient Medications  Medication Sig Dispense Refill   buPROPion (WELLBUTRIN XL) 300 MG 24 hr tablet Take 300 mg by mouth daily.      busPIRone (BUSPAR) 15 MG tablet Take 2 tablets (30 mg total) by mouth 2 (two) times daily. 120 tablet 1   escitalopram (LEXAPRO) 20 MG tablet Take 20 mg by mouth daily.      LORazepam (ATIVAN) 2 MG tablet Take 1 tablet (2 mg total) by mouth 3 (three) times daily as needed for anxiety. (Patient taking differently: Take 1 mg by mouth 2 (two) times daily.) 90 tablet 0   nystatin cream (MYCOSTATIN) Apply 1 application topically 2 (two) times daily as needed (candidal rash).  60 g 3   PROAIR HFA 108 (90 Base) MCG/ACT inhaler INHALE TWO PUFFS BY MOUTH EVERY FOUR HOURS AS NEEDED FOR SHORTNESS OFBREATH OR WHEEZING 8.5 g 5   triamcinolone cream (KENALOG) 0.1 % Apply 1 application topically 2 (two) times daily. 30 g 0   warfarin (COUMADIN) 10 MG tablet TAKE 1 TABLET BY MOUTH ONCE DAILY 30 tablet 3   No current facility-administered medications for this visit.   PHYSICAL EXAMINATION:  ECOG PERFORMANCE STATUS: 0 - Asymptomatic  Vitals:   01/14/21 1117  BP: (!) 145/67  Pulse: (!) 49  Resp: 18  SpO2: 99%   Filed Weights   01/14/21 1117  Weight: 212 lb 1.6 oz (96.2 kg)    GENERAL:alert, no distress and comfortable, obese, arrived in a wheelchair today. SKIN: skin color, texture, turgor are normal, no rashes or significant lesions EYES: normal, conjunctiva are pink and non-injected, sclera clear OROPHARYNX:no exudate, no erythema and lips, buccal mucosa, and tongue normal  NECK: supple, thyroid normal size, non-tender, without nodularity LYMPH:  no palpable lymphadenopathy in the cervical, axillary or inguinal LUNGS: clear to auscultation and percussion with normal breathing effort. HEART: regular rate & rhythm and no murmurs and no lower extremity edema Musculoskeletal:no cyanosis of digits and no clubbing   PSYCH: alert & oriented x 3 with fluent speech NEURO: no focal motor/sensory deficits  LABORATORY DATA:  I have reviewed the data as listed Lab Results  Component Value Date   WBC 6.2 05/18/2018   HGB 12.5 05/18/2018   HCT 40.6 05/18/2018   MCV 89.8 05/18/2018   PLT 243 05/18/2018     Chemistry      Component Value Date/Time   NA 138 05/18/2018 1007   NA 138 10/24/2014 0818   K 4.0 05/18/2018 1007   K 3.9 10/24/2014 0818   CL 104 05/18/2018 1007   CL 105 10/24/2014 0818   CO2 26 05/18/2018 1007   CO2 25 10/24/2014 0818   BUN 13 05/18/2018 1007   BUN 18 10/24/2014 0818   CREATININE 0.85 05/18/2018 1007   CREATININE 0.77 10/24/2014 0818      Component Value Date/Time   CALCIUM 9.0 05/18/2018 1007   CALCIUM 9.5 10/24/2014 0818   ALKPHOS 70 05/18/2018 1007   ALKPHOS 69 10/24/2014 0818   AST 30 05/18/2018 1007   AST 20 10/24/2014 0818   ALT 28 05/18/2018 1007   ALT 17 10/24/2014 0818   BILITOT 0.3 05/18/2018 1007   BILITOT 0.6 10/24/2014 0818       RADIOGRAPHIC STUDIES: I have personally reviewed the radiological images as listed and agreed with the findings in the report. No results found.  All questions were answered. The patient knows to call the clinic with any problems, questions or concerns. I spent 45 minutes in the care of this patient including H and P, review of records, counseling and coordination of care. I have reviewed her medical records for the past several years, reviewed imaging report since 2014.  We discussed anticoagulation recommendations, risks and benefits of anticoagulation, risks of recurrence and follow-up recommendations.    Benay Pike, MD 01/14/2021 11:41 AM

## 2021-01-15 LAB — LUPUS ANTICOAGULANT PANEL
DRVVT: 54.1 s — ABNORMAL HIGH (ref 0.0–47.0)
PTT Lupus Anticoagulant: 48.7 s (ref 0.0–51.9)

## 2021-01-15 LAB — BETA-2-GLYCOPROTEIN I ABS, IGG/M/A
Beta-2 Glyco I IgG: <9 GPI IgG units (ref 0–20)
Beta-2-Glycoprotein I IgA: <9 GPI IgA units (ref 0–25)
Beta-2-Glycoprotein I IgM: <9 GPI IgM units (ref 0–32)

## 2021-01-15 LAB — CARDIOLIPIN ANTIBODIES, IGG, IGM, IGA
Anticardiolipin IgA: <9 APL U/mL (ref 0–11)
Anticardiolipin IgG: <9 GPL U/mL (ref 0–14)
Anticardiolipin IgM: 17 MPL U/mL — ABNORMAL HIGH (ref 0–12)

## 2021-01-15 LAB — DRVVT MIX: dRVVT Mix: 40 s (ref 0.0–40.4)

## 2021-01-19 LAB — FACTOR 5 LEIDEN

## 2021-01-20 LAB — PROTHROMBIN GENE MUTATION

## 2021-02-04 ENCOUNTER — Inpatient Hospital Stay: Payer: Medicaid Other | Attending: Hematology and Oncology | Admitting: Hematology and Oncology

## 2021-02-04 ENCOUNTER — Encounter: Payer: Self-pay | Admitting: Hematology and Oncology

## 2021-02-04 DIAGNOSIS — Z86718 Personal history of other venous thrombosis and embolism: Secondary | ICD-10-CM

## 2021-02-04 NOTE — Progress Notes (Signed)
[name] CONSULT NOTE  Patient Care Team: [name], NP as PCP - General (Nurse Practitioner)  CHIEF COMPLAINTS/PURPOSE OF CONSULTATION:  History of PE, anticoagulation recommendations.  ASSESSMENT & PLAN:   This is a very pleasant 60 year old female patient with past medical history significant for COPD, obesity, history of PE referred to hematology for anticoagulation recommendations.    Patient couldn't access my chart, hence changed to telephone visit. Since her last visit, she has been doing well except for some skin rash issues.  She has been continuing on Coumadin for now.  We have reviewed her hypercoagulable work-up which was without any major concerning findings.  Protein C and protein S activity were not drawn because of concomitant Coumadin use. I have recommended that given the only episode of DVT/PE being in 2014, if she wishes to discontinue Coumadin, this is allowed.  We have discussed her risks of having another episode of PE/DVT.  We have discussed that she should go to the nearest ER with any symptoms or signs concerning for DVT/PE.  We have discussed about risk factors including immobilization, dehydration, surgery, trauma, estrogen use.  She was advised to avoid supplementation. She is comfortable discontinuing Coumadin, can consider taking baby aspirin. She can return to clinic as needed. With regards to the rash, since this is a phone visit, I cannot assess the rash any better, she was advised to call her PCP for further management of this rash. She will come back in a few weeks to get the protein C&S activity drawn.  Lab appointment will be made, message sent to the scheduling team.  At this time if the protein C&S activity levels are normal, she can return to clinic as needed.   HISTORY OF PRESENTING ILLNESS:   Stacey [name] 60 y.o. female is here because of History of PE  [name] is a 60 yr old female patient with PMH significant  for COPD, pancreatitis, obesity and history of PE referred to hematology for evaluation of anticoagulation recommendations.  She is here for telephone visit, cannot access my chart.  She feels well except for a skin rash that she noted on her feet and her left elbow, with some itching and she describes it as a maculopapular rash.  Besides the rash, she has been doing well.  She has called her primary care physician and has an appointment next week.  No other concerns for me today   MEDICAL HISTORY:  Past Medical History:  Diagnosis Date   Acid reflux    Anxiety    Arthritis    Asthma    Benign lipomatous neoplasm of skin, subcu of right leg 02/17/2013   Bilateral knee pain    C. difficile colitis 11/08/2011   Cervicalgia    Chronic hip pain    Chronic thoracic spine pain    Chronic, continuous use of opioids    COPD (chronic obstructive pulmonary disease) ([place])    Cyst    pt has "cyst" to legs for years, areas drain at time   Depression    Diverticulitis 2016   DVT (deep venous thrombosis) ([place]) 2014   Hair loss    Hypertension    Incisional hernia    in C-section incision   Lumbar spine pain    Musculoskeletal pain 11/08/2011   Neuropathic pain 11/10/2011   Pancreatitis 1991   Pulmonary embolism ([place]) 2014   Sleep apnea with use of continuous positive airway pressure (CPAP)    uses CPAP  at night   Type 2 diabetes mellitus (HCC)    Vitamin D deficiency    Warfarin anticoagulation     SURGICAL HISTORY: Past Surgical History:  Procedure Laterality Date   CESAREAN SECTION     x2   CHOLECYSTECTOMY  1991   laproscopic   NOSE SURGERY  1982   SKIN BIOPSY     TONSILLECTOMY  1980    SOCIAL HISTORY: Social History   Socioeconomic History   Marital status: Divorced    Spouse name: Not on file   Number of children: 2   Years of education: HS   Highest education level: Not on file  Occupational History   Occupation: disability  Tobacco Use   Smoking status: Former     Years: 2.00    Pack years: 0.00    Types: Cigarettes    Quit date: 2003    Years since quitting: 19.5   Smokeless tobacco: Never   Tobacco comments:    former light cigarette smoker  Media planner   Vaping Use: Never used  Substance and Sexual Activity   Alcohol use: No   Drug use: No   Sexual activity: Not Currently  Other Topics Concern   Not on file  Social History Narrative   Not on file   Social Determinants of Health   Financial Resource Strain: Not on file  Food Insecurity: Not on file  Transportation Needs: Not on file  Physical Activity: Not on file  Stress: Not on file  Social Connections: Not on file  Intimate Partner Violence: Not on file    FAMILY HISTORY: Family History  Problem Relation Age of Onset   Stroke Other    Cancer Other    Diabetes Other    Heart disease Mother    Diabetes Mother    Cancer Father        lung   Diabetes Sister    Diabetes Brother    Cancer Maternal Aunt    Alcohol abuse Maternal Uncle    Cancer Maternal Grandfather    Diabetes Brother     ALLERGIES:  is allergic to hydrocodone, nitroglycerin, tramadol, zofran [ondansetron hcl], hydromorphone, levaquin [levofloxacin in d5w], toradol [ketorolac tromethamine], shellfish allergy, levofloxacin, sulfa antibiotics, tape, and trazodone and nefazodone.  MEDICATIONS:  Current Outpatient Medications  Medication Sig Dispense Refill   buPROPion (WELLBUTRIN XL) 300 MG 24 hr tablet Take 300 mg by mouth daily.      busPIRone (BUSPAR) 15 MG tablet Take 2 tablets (30 mg total) by mouth 2 (two) times daily. 120 tablet 1   escitalopram (LEXAPRO) 20 MG tablet Take 20 mg by mouth daily.      LORazepam (ATIVAN) 2 MG tablet Take 1 tablet (2 mg total) by mouth 3 (three) times daily as needed for anxiety. (Patient taking differently: Take 1 mg by mouth 2 (two) times daily.) 90 tablet 0   nystatin cream (MYCOSTATIN) Apply 1 application topically 2 (two) times daily as needed (candidal rash). 60 g 3    PROAIR HFA 108 (90 Base) MCG/ACT inhaler INHALE TWO PUFFS BY MOUTH EVERY FOUR HOURS AS NEEDED FOR SHORTNESS OFBREATH OR WHEEZING 8.5 g 5   triamcinolone cream (KENALOG) 0.1 % Apply 1 application topically 2 (two) times daily. 30 g 0   warfarin (COUMADIN) 10 MG tablet TAKE 1 TABLET BY MOUTH ONCE DAILY 30 tablet 3   No current facility-administered medications for this visit.   PHYSICAL EXAMINATION:  ECOG PERFORMANCE STATUS: 0 - Asymptomatic  V/S and PE  not done, telephone visit.  LABORATORY DATA:  I have reviewed the data as listed Lab Results  Component Value Date   WBC 6.2 05/18/2018   HGB 12.5 05/18/2018   HCT 40.6 05/18/2018   MCV 89.8 05/18/2018   PLT 243 05/18/2018     Chemistry      Component Value Date/Time   NA 138 05/18/2018 1007   NA 138 10/24/2014 0818   K 4.0 05/18/2018 1007   K 3.9 10/24/2014 0818   CL 104 05/18/2018 1007   CL 105 10/24/2014 0818   CO2 26 05/18/2018 1007   CO2 25 10/24/2014 0818   BUN 13 05/18/2018 1007   BUN 18 10/24/2014 0818   CREATININE 0.85 05/18/2018 1007   CREATININE 0.77 10/24/2014 0818      Component Value Date/Time   CALCIUM 9.0 05/18/2018 1007   CALCIUM 9.5 10/24/2014 0818   ALKPHOS 70 05/18/2018 1007   ALKPHOS 69 10/24/2014 0818   AST 30 05/18/2018 1007   AST 20 10/24/2014 0818   ALT 28 05/18/2018 1007   ALT 17 10/24/2014 0818   BILITOT 0.3 05/18/2018 1007   BILITOT 0.6 10/24/2014 0818       RADIOGRAPHIC STUDIES: I have personally reviewed the radiological images as listed and agreed with the findings in the report. No results found.  All questions were answered. The patient knows to call the clinic with any problems, questions or concerns.  I connected with  Stacey [name] on 02/04/21 by a video enabled telemedicine application and verified that I am speaking with the correct person using two identifiers.   I discussed the limitations of evaluation and management by telephone. The patient expressed  understanding and agreed to proceed.   I spent 20 minutes in the care of this patient including H and P, review of records, counseling and coordination of care. We have reviewed hypercoagulable work-up, symptoms and signs of DVT/PE, risk factors for DVT/PE recurrence in the future.    Benay Pike, MD 02/04/2021 6:03 PM

## 2021-02-25 ENCOUNTER — Other Ambulatory Visit: Payer: Medicaid Other

## 2021-03-10 ENCOUNTER — Emergency Department (HOSPITAL_COMMUNITY)
Admission: EM | Admit: 2021-03-10 | Discharge: 2021-03-11 | Disposition: A | Discharge status: Home / Self Care | Payer: Medicaid Other | Attending: Emergency Medicine | Admitting: Emergency Medicine

## 2021-03-10 ENCOUNTER — Other Ambulatory Visit: Payer: Self-pay

## 2021-03-10 DIAGNOSIS — Z5321 Procedure and treatment not carried out due to patient leaving prior to being seen by health care provider: Secondary | ICD-10-CM | POA: Diagnosis not present

## 2021-03-10 DIAGNOSIS — R42 Dizziness and giddiness: Secondary | ICD-10-CM | POA: Diagnosis not present

## 2021-03-10 DIAGNOSIS — R001 Bradycardia, unspecified: Secondary | ICD-10-CM | POA: Diagnosis not present

## 2021-03-10 DIAGNOSIS — R110 Nausea: Secondary | ICD-10-CM | POA: Insufficient documentation

## 2021-03-10 DIAGNOSIS — R5381 Other malaise: Secondary | ICD-10-CM | POA: Insufficient documentation

## 2021-03-10 NOTE — ED Triage Notes (Signed)
Brought in by [facility] from home - c/o steady decline of heart rate over past year - was 70s now 40s - dizziness and nausea.  EKG showed 1st degree block. Pt hasnt been eating and drinking well.   BP160/100. Received 500cc saline but no change in heart rate. Pt not on BP meds and is taking inhaler more than the usual.

## 2021-03-10 NOTE — ED Provider Notes (Signed)
Emergency Medicine Provider Triage Evaluation Note  Stacey [name] , a 60 y.o. female  was evaluated in triage.  Pt complains of malaise and lightheadedness. Symptoms tonight have been profound, constant. They are aggravated by position change. Associated with mild nausea. Sometimes she will see a "black cloud" across her vision; this is fleeting. Has been being evaluated for bradycardia; HR trending from 70's 3 months ago down to the 40's. Has had a heart rate in the 40's for the past 1-2 months. Reportedly saw cardiology with reassuring echocardiogram. Has had 260 pound intentional weight loss over the past 2 years. Denies recent fever or viral illness. No chest pain, syncope, vomiting, ETOH or drug use, use of weight loss supplements. No use of beta blockers.  Review of Systems  Positive: Lightheadedness, malaise, nausea Negative: Syncope, fever, vomiting  Physical Exam  BP (!) 155/63 (BP Location: Left Arm)   Pulse (!) 46   Temp 97.8 F (36.6 C) (Oral)   Resp 18   Ht '5\' 6"'$  (1.676 m)   Wt 96 kg   SpO2 100%   BMI 34.16 kg/m  Gen:   Awake, no distress   Resp:  Normal effort  MSK:   Moves extremities without difficulty  Other:  Heart rate bradycardic, 46bpm. Obese, pleasant female.  Medical Decision Making  Medically screening exam initiated at 10:40 PM.  Appropriate orders placed.  [name] was informed that the remainder of the evaluation will be completed by another provider, this initial triage assessment does not replace that evaluation, and the importance of remaining in the ED until their evaluation is complete.  Symptomatic bradycardia.   Antonietta Breach, PA-C 03/10/21 [place], [place] A, DO 03/11/21 0028

## 2021-03-10 NOTE — ED Notes (Signed)
Patient stated she by ems and could not wait anymore she had already call her son to pick her up.ad she will call the doctor in the morning.

## 2021-03-11 ENCOUNTER — Emergency Department: Payer: Medicaid Other

## 2021-03-11 ENCOUNTER — Observation Stay
Admission: EM | Admit: 2021-03-11 | Discharge: 2021-03-11 | Disposition: A | Discharge status: Home / Self Care | Payer: Medicaid Other | Source: Home / Self Care | Attending: Internal Medicine | Admitting: Internal Medicine

## 2021-03-11 ENCOUNTER — Other Ambulatory Visit: Payer: Self-pay

## 2021-03-11 ENCOUNTER — Encounter: Payer: Self-pay | Admitting: Internal Medicine

## 2021-03-11 ENCOUNTER — Inpatient Hospital Stay
Admission: EM | Admit: 2021-03-11 | Discharge: 2021-03-13 | DRG: 309 | Disposition: A | Discharge status: Home / Self Care | Payer: Medicaid Other | Attending: Internal Medicine | Admitting: Internal Medicine

## 2021-03-11 DIAGNOSIS — G4733 Obstructive sleep apnea (adult) (pediatric): Secondary | ICD-10-CM | POA: Diagnosis present

## 2021-03-11 DIAGNOSIS — Z882 Allergy status to sulfonamides status: Secondary | ICD-10-CM

## 2021-03-11 DIAGNOSIS — Z9119 Patient's noncompliance with other medical treatment and regimen: Secondary | ICD-10-CM

## 2021-03-11 DIAGNOSIS — I480 Paroxysmal atrial fibrillation: Secondary | ICD-10-CM | POA: Diagnosis present

## 2021-03-11 DIAGNOSIS — Z8673 Personal history of transient ischemic attack (TIA), and cerebral infarction without residual deficits: Secondary | ICD-10-CM

## 2021-03-11 DIAGNOSIS — E785 Hyperlipidemia, unspecified: Secondary | ICD-10-CM | POA: Diagnosis present

## 2021-03-11 DIAGNOSIS — R001 Bradycardia, unspecified: Principal | ICD-10-CM | POA: Diagnosis present

## 2021-03-11 DIAGNOSIS — Z91048 Other nonmedicinal substance allergy status: Secondary | ICD-10-CM

## 2021-03-11 DIAGNOSIS — Z888 Allergy status to other drugs, medicaments and biological substances status: Secondary | ICD-10-CM

## 2021-03-11 DIAGNOSIS — Z7901 Long term (current) use of anticoagulants: Secondary | ICD-10-CM

## 2021-03-11 DIAGNOSIS — K219 Gastro-esophageal reflux disease without esophagitis: Secondary | ICD-10-CM | POA: Diagnosis present

## 2021-03-11 DIAGNOSIS — E782 Mixed hyperlipidemia: Secondary | ICD-10-CM | POA: Diagnosis present

## 2021-03-11 DIAGNOSIS — I10 Essential (primary) hypertension: Secondary | ICD-10-CM | POA: Diagnosis present

## 2021-03-11 DIAGNOSIS — Z91013 Allergy to seafood: Secondary | ICD-10-CM

## 2021-03-11 DIAGNOSIS — Z20822 Contact with and (suspected) exposure to covid-19: Secondary | ICD-10-CM | POA: Diagnosis present

## 2021-03-11 DIAGNOSIS — Z8249 Family history of ischemic heart disease and other diseases of the circulatory system: Secondary | ICD-10-CM

## 2021-03-11 DIAGNOSIS — I340 Nonrheumatic mitral (valve) insufficiency: Secondary | ICD-10-CM | POA: Diagnosis present

## 2021-03-11 DIAGNOSIS — N390 Urinary tract infection, site not specified: Secondary | ICD-10-CM | POA: Diagnosis present

## 2021-03-11 DIAGNOSIS — Z9049 Acquired absence of other specified parts of digestive tract: Secondary | ICD-10-CM

## 2021-03-11 DIAGNOSIS — J449 Chronic obstructive pulmonary disease, unspecified: Secondary | ICD-10-CM | POA: Diagnosis present

## 2021-03-11 DIAGNOSIS — G8929 Other chronic pain: Secondary | ICD-10-CM | POA: Diagnosis present

## 2021-03-11 DIAGNOSIS — F339 Major depressive disorder, recurrent, unspecified: Secondary | ICD-10-CM | POA: Diagnosis present

## 2021-03-11 DIAGNOSIS — Z87891 Personal history of nicotine dependence: Secondary | ICD-10-CM

## 2021-03-11 DIAGNOSIS — I440 Atrioventricular block, first degree: Secondary | ICD-10-CM | POA: Diagnosis present

## 2021-03-11 DIAGNOSIS — E669 Obesity, unspecified: Secondary | ICD-10-CM | POA: Diagnosis present

## 2021-03-11 DIAGNOSIS — M1990 Unspecified osteoarthritis, unspecified site: Secondary | ICD-10-CM | POA: Diagnosis present

## 2021-03-11 DIAGNOSIS — Z6837 Body mass index (BMI) 37.0-37.9, adult: Secondary | ICD-10-CM

## 2021-03-11 DIAGNOSIS — M542 Cervicalgia: Secondary | ICD-10-CM | POA: Diagnosis present

## 2021-03-11 DIAGNOSIS — Z86711 Personal history of pulmonary embolism: Secondary | ICD-10-CM

## 2021-03-11 DIAGNOSIS — Z833 Family history of diabetes mellitus: Secondary | ICD-10-CM

## 2021-03-11 DIAGNOSIS — I951 Orthostatic hypotension: Secondary | ICD-10-CM

## 2021-03-11 DIAGNOSIS — Z86718 Personal history of other venous thrombosis and embolism: Secondary | ICD-10-CM

## 2021-03-11 DIAGNOSIS — D696 Thrombocytopenia, unspecified: Secondary | ICD-10-CM | POA: Diagnosis present

## 2021-03-11 DIAGNOSIS — Z79899 Other long term (current) drug therapy: Secondary | ICD-10-CM

## 2021-03-11 DIAGNOSIS — Z885 Allergy status to narcotic agent status: Secondary | ICD-10-CM

## 2021-03-11 DIAGNOSIS — Z881 Allergy status to other antibiotic agents status: Secondary | ICD-10-CM

## 2021-03-11 DIAGNOSIS — R42 Dizziness and giddiness: Secondary | ICD-10-CM | POA: Diagnosis present

## 2021-03-11 DIAGNOSIS — F419 Anxiety disorder, unspecified: Secondary | ICD-10-CM | POA: Diagnosis present

## 2021-03-11 DIAGNOSIS — Z713 Dietary counseling and surveillance: Secondary | ICD-10-CM

## 2021-03-11 LAB — CBC WITH DIFFERENTIAL/PLATELET
Abs Immature Granulocytes: 0.01 10*3/uL (ref 0.00–0.07)
Abs Immature Granulocytes: 0.01 10*3/uL (ref 0.00–0.07)
Basophils Absolute: 0 10*3/uL (ref 0.0–0.1)
Basophils Absolute: 0 10*3/uL (ref 0.0–0.1)
Basophils Relative: 1 %
Basophils Relative: 1 %
Eosinophils Absolute: 0.1 10*3/uL (ref 0.0–0.5)
Eosinophils Absolute: 0.2 10*3/uL (ref 0.0–0.5)
Eosinophils Relative: 2 %
Eosinophils Relative: 3 %
HCT: 34.7 % — ABNORMAL LOW (ref 36.0–46.0)
HCT: 34.1 % — ABNORMAL LOW (ref 36.0–46.0)
Hemoglobin: 11.4 g/dL — ABNORMAL LOW (ref 12.0–15.0)
Hemoglobin: 11.4 g/dL — ABNORMAL LOW (ref 12.0–15.0)
Immature Granulocytes: 0 %
Immature Granulocytes: 0 %
Lymphocytes Relative: 48 %
Lymphocytes Relative: 48 %
Lymphs Abs: 2.6 10*3/uL (ref 0.7–4.0)
Lymphs Abs: 2.4 10*3/uL (ref 0.7–4.0)
MCH: 29.6 pg (ref 26.0–34.0)
MCH: 29.9 pg (ref 26.0–34.0)
MCHC: 32.9 g/dL (ref 30.0–36.0)
MCHC: 33.4 g/dL (ref 30.0–36.0)
MCV: 90.1 fL (ref 80.0–100.0)
MCV: 89.5 fL (ref 80.0–100.0)
Monocytes Absolute: 0.4 10*3/uL (ref 0.1–1.0)
Monocytes Absolute: 0.4 10*3/uL (ref 0.1–1.0)
Monocytes Relative: 7 %
Monocytes Relative: 8 %
Neutro Abs: 2.2 10*3/uL (ref 1.7–7.7)
Neutro Abs: 2 10*3/uL (ref 1.7–7.7)
Neutrophils Relative %: 42 %
Neutrophils Relative %: 40 %
Platelets: 161 10*3/uL (ref 150–400)
Platelets: 143 10*3/uL — ABNORMAL LOW (ref 150–400)
RBC: 3.85 MIL/uL — ABNORMAL LOW (ref 3.87–5.11)
RBC: 3.81 MIL/uL — ABNORMAL LOW (ref 3.87–5.11)
RDW: 12.9 % (ref 11.5–15.5)
RDW: 12.8 % (ref 11.5–15.5)
WBC: 5.4 10*3/uL (ref 4.0–10.5)
WBC: 4.9 10*3/uL (ref 4.0–10.5)
nRBC: 0 % (ref 0.0–0.2)
nRBC: 0 % (ref 0.0–0.2)

## 2021-03-11 LAB — COMPREHENSIVE METABOLIC PANEL
ALT: 22 U/L (ref 0–44)
ALT: 20 U/L (ref 0–44)
AST: 22 U/L (ref 15–41)
AST: 21 U/L (ref 15–41)
Albumin: 3.7 g/dL (ref 3.5–5.0)
Albumin: 3.9 g/dL (ref 3.5–5.0)
Alkaline Phosphatase: 54 U/L (ref 38–126)
Alkaline Phosphatase: 51 U/L (ref 38–126)
Anion gap: 4 — ABNORMAL LOW (ref 5–15)
Anion gap: 6 (ref 5–15)
BUN: 21 mg/dL — ABNORMAL HIGH (ref 6–20)
BUN: 26 mg/dL — ABNORMAL HIGH (ref 6–20)
CO2: 24 mmol/L (ref 22–32)
CO2: 24 mmol/L (ref 22–32)
Calcium: 9.2 mg/dL (ref 8.9–10.3)
Calcium: 9.4 mg/dL (ref 8.9–10.3)
Chloride: 112 mmol/L — ABNORMAL HIGH (ref 98–111)
Chloride: 109 mmol/L (ref 98–111)
Creatinine, Ser: 0.81 mg/dL (ref 0.44–1.00)
Creatinine, Ser: 0.86 mg/dL (ref 0.44–1.00)
GFR, Estimated: >60 mL/min (ref >60)
GFR, Estimated: >60 mL/min (ref >60)
Glucose, Bld: 96 mg/dL (ref 70–99)
Glucose, Bld: 87 mg/dL (ref 70–99)
Potassium: 3.5 mmol/L (ref 3.5–5.1)
Potassium: 3.5 mmol/L (ref 3.5–5.1)
Sodium: 140 mmol/L (ref 135–145)
Sodium: 139 mmol/L (ref 135–145)
Total Bilirubin: 0.6 mg/dL (ref 0.3–1.2)
Total Bilirubin: 0.7 mg/dL (ref 0.3–1.2)
Total Protein: 6.1 g/dL — ABNORMAL LOW (ref 6.5–8.1)
Total Protein: 6.3 g/dL — ABNORMAL LOW (ref 6.5–8.1)

## 2021-03-11 LAB — URINALYSIS, COMPLETE (UACMP) WITH MICROSCOPIC
Bilirubin Urine: NEGATIVE
Glucose, UA: NEGATIVE mg/dL
Hgb urine dipstick: NEGATIVE
Ketones, ur: 5 mg/dL — AB
Nitrite: NEGATIVE
Protein, ur: NEGATIVE mg/dL
Specific Gravity, Urine: 1.03 (ref 1.005–1.030)
pH: 5 (ref 5.0–8.0)

## 2021-03-11 LAB — CBC
HCT: 34.4 % — ABNORMAL LOW (ref 36.0–46.0)
Hemoglobin: 11.5 g/dL — ABNORMAL LOW (ref 12.0–15.0)
MCH: 30.4 pg (ref 26.0–34.0)
MCHC: 33.4 g/dL (ref 30.0–36.0)
MCV: 91 fL (ref 80.0–100.0)
Platelets: 150 10*3/uL (ref 150–400)
RBC: 3.78 MIL/uL — ABNORMAL LOW (ref 3.87–5.11)
RDW: 12.9 % (ref 11.5–15.5)
WBC: 3.8 10*3/uL — ABNORMAL LOW (ref 4.0–10.5)
nRBC: 0 % (ref 0.0–0.2)

## 2021-03-11 LAB — URINALYSIS, ROUTINE W REFLEX MICROSCOPIC
Bacteria, UA: NONE SEEN
Bilirubin Urine: NEGATIVE
Glucose, UA: NEGATIVE mg/dL
Hgb urine dipstick: NEGATIVE
Ketones, ur: NEGATIVE mg/dL
Nitrite: NEGATIVE
Protein, ur: NEGATIVE mg/dL
Specific Gravity, Urine: 1.024 (ref 1.005–1.030)
WBC, UA: >50 WBC/hpf — ABNORMAL HIGH (ref 0–5)
pH: 8 (ref 5.0–8.0)

## 2021-03-11 LAB — ECHOCARDIOGRAM COMPLETE
AR max vel: 2.55 cm2
AV Area VTI: 2.79 cm2
AV Area mean vel: 2.58 cm2
AV Mean grad: 3 mmHg
AV Peak grad: 5.4 mmHg
Ao pk vel: 1.16 m/s
Area-P 1/2: 4.46 cm2
S' Lateral: 3.3 cm

## 2021-03-11 LAB — TROPONIN I (HIGH SENSITIVITY)
Troponin I (High Sensitivity): 6 ng/L (ref <18)
Troponin I (High Sensitivity): 6 ng/L (ref <18)

## 2021-03-11 LAB — RESP PANEL BY RT-PCR (FLU A&B, COVID) ARPGX2
Influenza A by PCR: NEGATIVE
Influenza B by PCR: NEGATIVE
SARS Coronavirus 2 by RT PCR: NEGATIVE

## 2021-03-11 LAB — PROTIME-INR
INR: 1.2 (ref 0.8–1.2)
Prothrombin Time: 14.7 seconds (ref 11.4–15.2)

## 2021-03-11 LAB — T4, FREE: Free T4: 0.82 ng/dL (ref 0.61–1.12)

## 2021-03-11 LAB — HEMOGLOBIN A1C
Hgb A1c MFr Bld: 4.9 % (ref 4.8–5.6)
Mean Plasma Glucose: 93.93 mg/dL

## 2021-03-11 LAB — CREATININE, SERUM
Creatinine, Ser: 0.76 mg/dL (ref 0.44–1.00)
GFR, Estimated: >60 mL/min (ref >60)

## 2021-03-11 LAB — PHOSPHORUS: Phosphorus: 2.4 mg/dL — ABNORMAL LOW (ref 2.5–4.6)

## 2021-03-11 LAB — HIV ANTIBODY (ROUTINE TESTING W REFLEX): HIV Screen 4th Generation wRfx: NONREACTIVE

## 2021-03-11 LAB — MAGNESIUM: Magnesium: 2 mg/dL (ref 1.7–2.4)

## 2021-03-11 LAB — TSH: TSH: 1.99 u[IU]/mL (ref 0.350–4.500)

## 2021-03-11 MED ORDER — ENOXAPARIN SODIUM 40 MG/0.4ML IJ SOSY: 40.0000 mg | PREFILLED_SYRINGE | INTRAMUSCULAR | Status: DC

## 2021-03-11 MED ORDER — SODIUM CHLORIDE 0.9 % IV SOLN: INTRAVENOUS | Status: DC

## 2021-03-11 MED ORDER — SODIUM CHLORIDE 0.9% FLUSH
3.0000 mL | Freq: Two times a day (BID) | INTRAVENOUS | Status: DC
  Administered 2021-03-11 – 2021-03-12 (×3): 3 mL via INTRAVENOUS

## 2021-03-11 MED ORDER — SODIUM CHLORIDE 0.9 % IV SOLN
1.0000 g | INTRAVENOUS | Status: DC
  Administered 2021-03-12 – 2021-03-13 (×2): 1 g via INTRAVENOUS
  Filled 2021-03-11: qty 10
  Filled 2021-03-11: qty 1

## 2021-03-11 MED ORDER — LORAZEPAM 0.5 MG PO TABS: 0.5000 mg | ORAL_TABLET | Freq: Three times a day (TID) | ORAL | Status: DC | PRN

## 2021-03-11 MED ORDER — ALBUTEROL SULFATE (2.5 MG/3ML) 0.083% IN NEBU
3.0000 mL | INHALATION_SOLUTION | RESPIRATORY_TRACT | Status: DC | PRN
  Administered 2021-03-11: 3 mL via RESPIRATORY_TRACT
  Filled 2021-03-11: qty 3

## 2021-03-11 MED ORDER — ATROPINE SULFATE 1 MG/ML IJ SOLN
0.5000 mg | INTRAMUSCULAR | Status: DC | PRN
  Filled 2021-03-11: qty 0.5

## 2021-03-11 MED ORDER — SODIUM CHLORIDE 0.9 % IV BOLUS
1000.0000 mL | Freq: Once | INTRAVENOUS | Status: AC
  Administered 2021-03-11: 1000 mL via INTRAVENOUS

## 2021-03-11 MED ORDER — SODIUM CHLORIDE 0.9 % IV SOLN
1.0000 g | Freq: Once | INTRAVENOUS | Status: AC
  Administered 2021-03-11: 1 g via INTRAVENOUS
  Filled 2021-03-11: qty 10

## 2021-03-11 MED ORDER — ACETAMINOPHEN 650 MG RE SUPP: 650.0000 mg | Freq: Four times a day (QID) | RECTAL | Status: DC | PRN

## 2021-03-11 MED ORDER — SIMVASTATIN 20 MG PO TABS
10.0000 mg | ORAL_TABLET | Freq: Every day | ORAL | Status: DC
  Administered 2021-03-11 – 2021-03-12 (×2): 10 mg via ORAL
  Filled 2021-03-11 (×2): qty 1

## 2021-03-11 MED ORDER — ATROPINE SULFATE 1 MG/ML IJ SOLN
0.5000 mg | Freq: Once | INTRAMUSCULAR | Status: AC
  Administered 2021-03-11: 0.5 mg via INTRAVENOUS
  Filled 2021-03-11: qty 0.5

## 2021-03-11 MED ORDER — BUSPIRONE HCL 15 MG PO TABS
30.0000 mg | ORAL_TABLET | Freq: Two times a day (BID) | ORAL | Status: DC
  Administered 2021-03-11 – 2021-03-12 (×2): 30 mg via ORAL
  Administered 2021-03-12: 15 mg via ORAL
  Filled 2021-03-11 (×3): qty 2

## 2021-03-11 MED ORDER — BUPROPION HCL ER (XL) 150 MG PO TB24
300.0000 mg | ORAL_TABLET | Freq: Every day | ORAL | Status: DC
  Administered 2021-03-12 – 2021-03-13 (×2): 300 mg via ORAL
  Filled 2021-03-11 (×2): qty 2

## 2021-03-11 MED ORDER — ESCITALOPRAM OXALATE 20 MG PO TABS
20.0000 mg | ORAL_TABLET | Freq: Every day | ORAL | Status: DC
  Administered 2021-03-12 – 2021-03-13 (×2): 20 mg via ORAL
  Filled 2021-03-11 (×2): qty 1

## 2021-03-11 MED ORDER — ACETAMINOPHEN 325 MG PO TABS
650.0000 mg | ORAL_TABLET | Freq: Four times a day (QID) | ORAL | Status: DC | PRN
  Administered 2021-03-12 (×2): 650 mg via ORAL
  Filled 2021-03-11 (×2): qty 2

## 2021-03-11 MED ORDER — ENOXAPARIN SODIUM 60 MG/0.6ML IJ SOSY
0.5000 mg/kg | PREFILLED_SYRINGE | INTRAMUSCULAR | Status: DC
Start: 2021-03-11 — End: 2021-03-13
  Administered 2021-03-11 – 2021-03-13 (×3): 47.5 mg via SUBCUTANEOUS
  Filled 2021-03-11 (×2): qty 0.47
  Filled 2021-03-11: qty 0.6

## 2021-03-11 NOTE — ED Notes (Signed)
Echo at bedside

## 2021-03-11 NOTE — ED Notes (Signed)
Pt states that she is cold and was given warm blankets

## 2021-03-11 NOTE — ED Notes (Signed)
Patient given meal tray.

## 2021-03-11 NOTE — Progress Notes (Signed)
*  PRELIMINARY RESULTS* Echocardiogram 2D Echocardiogram has been performed.  Stacey [name] 03/11/2021, 2:34 PM

## 2021-03-11 NOTE — ED Notes (Addendum)
Patient reports labs were drawn at [facility] around 2300.

## 2021-03-11 NOTE — H&P (Signed)
History and Physical    Stacey [name] [id number] DOB: [birth year] DOA: 03/11/2021  PCP: [name], NP  Patient coming from: home  I have personally briefly reviewed patient's old medical records in [place]  Chief Complaint:  low hr lightheadedness and nausea   HPI: Stacey [name] is a 60 y.o. female with medical history significant of  COPD,Asthma, Anxiety, GERD, hx of DVT /PE 2014 s/p tx, HTN, OSA on CPAP, DMII who presents to with complaints of lightheadedness nausea and low heart rate. Per patient her symptoms have been progressive over the last year. She states she has been evaluated by her primary who then sent her to cardiologist at [facility] (Dr Brigitte Pulse) close to two months ago. AT that time per patient there was no noted abnormality on her evaluation , she states she had echo which was normal and per patient did not require further cardiac evaluation at that time. Of note patient has had bradycardia for may years she was evaluated in 2015 with holter and  was noted to have short lived episode of afib with average heart rate of baseline normal sinus rhythm and sinus bradycardia with average heart rate of 66 bpm and brief episodes of atrial fibrillation.  Patient currently denies any chest pain, HA, no prolonged nausea , no diaphoresis , + sob , + generalized weakness and lack of energy.  No cough  or sorethroat , uri sxs.  ED Course: Labs: afeb,hr 59-43, BP 158/55 sat 99%  Wbc4.9, hgb 11.4 at base,plt 143 downward trend Inr 1.2 CE6 Tsh:1.99 , t40.82 Urine culture pending +orthostasis UA: rare bacteria, large LE,+ wbc (21-50) [id number] brady 38 no st-twave changes Borderline to mild cardiomegaly. No acute cardiopulmonary abnormality. Covid:negative Tx ctx, atrophine 0.5 Review of Systems: As per HPI otherwise 10 point review of systems negative.   Past Medical History:  Diagnosis Date   Acid reflux    Anxiety    Arthritis    Asthma    Benign  lipomatous neoplasm of skin, subcu of right leg 02/17/2013   Bilateral knee pain    C. difficile colitis 11/08/2011   Cervicalgia    Chronic hip pain    Chronic thoracic spine pain    Chronic, continuous use of opioids    COPD (chronic obstructive pulmonary disease) ([place])    Cyst    pt has "cyst" to legs for years, areas drain at time   Depression    Diverticulitis 2016   DVT (deep venous thrombosis) ([place]) 2014   Hair loss    Hypertension    Incisional hernia    in C-section incision   Lumbar spine pain    Musculoskeletal pain 11/08/2011   Neuropathic pain 11/10/2011   Pancreatitis 1991   Pulmonary embolism ([place]) 2014   Sleep apnea with use of continuous positive airway pressure (CPAP)    uses CPAP at night   Type 2 diabetes mellitus ([place])    Vitamin D deficiency    Warfarin anticoagulation     Past Surgical History:  Procedure Laterality Date   CESAREAN SECTION     x2   CHOLECYSTECTOMY  1991   laproscopic   [place]     reports that she quit smoking about 19 years ago. Her smoking use included cigarettes. She has never used smokeless tobacco. She reports that she does not drink alcohol and does not use drugs.  Allergies  Allergen Reactions   Hydrocodone Anaphylaxis   Nitroglycerin Anxiety   Tramadol Anaphylaxis and Swelling   Zofran [Ondansetron Hcl] Anaphylaxis    Panic Attacks   Hydromorphone Other (See Comments)    Dilaudid. "hallucinations" has tolerated morphine, but "it doesn't work very well"   Levaquin [Levofloxacin In D5w] Nausea And Vomiting   Toradol [Ketorolac Tromethamine] Swelling   Shellfish Allergy     Pt reports she was told by a MD/RN in a previous encounter during a CT scan with contrast that she was possibly allergic to shellfish. Pt reports she gets very sick to her stomach with IV contrast.   Levofloxacin Other (See Comments)   Sulfa Antibiotics Rash   Tape Rash   Trazodone And Nefazodone Other  (See Comments)    Family History  Problem Relation Age of Onset   Stroke Other    Cancer Other    Diabetes Other    Heart disease Mother    Diabetes Mother    Cancer Father        lung   Diabetes Sister    Diabetes Brother    Cancer Maternal Aunt    Alcohol abuse Maternal Uncle    Cancer Maternal Grandfather    Diabetes Brother    Prior to Admission medications   Medication Sig Start Date End Date Taking? Authorizing Provider  buPROPion (WELLBUTRIN XL) 300 MG 24 hr tablet Take 300 mg by mouth daily.     [provider]  busPIRone (BUSPAR) 15 MG tablet Take 2 tablets (30 mg total) by mouth 2 (two) times daily. 08/15/13   Dixon Boos, MD  escitalopram (LEXAPRO) 20 MG tablet Take 20 mg by mouth daily.     [provider]  LORazepam (ATIVAN) 2 MG tablet Take 1 tablet (2 mg total) by mouth 3 (three) times daily as needed for anxiety. Patient taking differently: Take 1 mg by mouth 2 (two) times daily. 04/05/17   Bacigalupo, Dionne Bucy, MD  nystatin cream (MYCOSTATIN) Apply 1 application topically 2 (two) times daily as needed (candidal rash). 12/08/17   Bacigalupo, Dionne Bucy, MD  PROAIR HFA 108 [phone] Base) MCG/ACT inhaler INHALE TWO PUFFS BY MOUTH EVERY FOUR HOURS AS NEEDED FOR SHORTNESS OFBREATH OR WHEEZING 03/07/18   Bacigalupo, Dionne Bucy, MD  triamcinolone cream (KENALOG) 0.1 % Apply 1 application topically 2 (two) times daily. 06/15/19   Tasia Catchings, Amy V, PA-C  warfarin (COUMADIN) 10 MG tablet TAKE 1 TABLET BY MOUTH ONCE DAILY 10/17/18   Kris Hartmann, NP  lisinopril (PRINIVIL,ZESTRIL) 5 MG tablet Take 1 tablet (5 mg total) by mouth daily. 05/24/17 06/15/19  Virginia Crews, MD  promethazine (PHENERGAN) 25 MG tablet Take 1 tablet (25 mg total) by mouth every 6 (six) hours as needed for nausea or vomiting. 05/18/18 06/15/19  Evalee Jefferson, PA-C    Physical Exam: Vitals:   03/11/21 0630 03/11/21 0645 03/11/21 0700 03/11/21 0715  BP: (!) 114/49 (!) 130/50 (!) 137/59 (!)  125/56  Pulse: (!) 48 (!) 47 (!) 59 (!) 55  Resp: '12 12 12 12  '$ Temp:      TempSrc:      SpO2: 99% 100% 100% 100%     Vitals:   03/11/21 0630 03/11/21 0645 03/11/21 0700 03/11/21 0715  BP: (!) 114/49 (!) 130/50 (!) 137/59 (!) 125/56  Pulse: (!) 48 (!) 47 (!) 59 (!) 55  Resp: '12 12 12 12  '$ Temp:      TempSrc:      SpO2:  99% 100% 100% 100%  Constitutional: NAD, calm, comfortable Eyes: PERRL, lids and conjunctivae normal ENMT: Mucous membranes are moist. Posterior pharynx clear of any exudate or lesions.Normal dentition.  Neck: normal, supple, no masses, no thyromegaly Respiratory: clear to auscultation bilaterally, no wheezing, no crackles. Normal respiratory effort. No accessory muscle use.  Cardiovascular: Regular rate and rhythm, no murmurs / rubs / gallops. No extremity edema. 2+ pedal pulses. No carotid bruits.  Abdomen: no tenderness, no masses palpated. No hepatosplenomegaly. Bowel sounds positive.  Musculoskeletal: no clubbing / cyanosis. No joint deformity upper and lower extremities. Good ROM, no contractures. Normal muscle tone.  Skin: no rashes, lesions, ulcers. No induration Neurologic: CN 2-12 grossly intact. Sensation intact, DTR normal. Strength 5/5 in all 4.  Psychiatric: Normal judgment and insight. Alert and oriented x 3. Normal mood.    Labs on Admission: I have personally reviewed following labs and imaging studies  CBC: Recent Labs  Lab 03/10/21 2307 03/11/21 0530  WBC 5.4 4.9  NEUTROABS 2.2 2.0  HGB 11.4* 11.4*  HCT 34.7* 34.1*  MCV 90.1 89.5  PLT 161 [id number]*   Basic Metabolic Panel: Recent Labs  Lab 03/10/21 2307 03/11/21 0530  NA 140 139  K 3.5 3.5  CL 112* 109  CO2 24 24  GLUCOSE 96 87  BUN 21* 26*  CREATININE 0.81 0.86  CALCIUM 9.2 9.4  MG 2.0  --   PHOS 2.4*  --    GFR: Estimated Creatinine Clearance: 84.3 mL/min (by C-G formula based on SCr of 0.86 mg/dL). Liver Function Tests: Recent Labs  Lab 03/10/21 2307 03/11/21 0530  AST  22 21  ALT 22 20  ALKPHOS 54 51  BILITOT 0.6 0.7  PROT 6.1* 6.3*  ALBUMIN 3.7 3.9   No results for input(s): LIPASE, AMYLASE in the last 168 hours. No results for input(s): AMMONIA in the last 168 hours. Coagulation Profile: Recent Labs  Lab 03/11/21 0530  INR 1.2   Cardiac Enzymes: No results for input(s): CKTOTAL, CKMB, CKMBINDEX, TROPONINI in the last 168 hours. BNP (last 3 results) No results for input(s): PROBNP in the last 8760 hours. HbA1C: No results for input(s): HGBA1C in the last 72 hours. CBG: No results for input(s): GLUCAP in the last 168 hours. Lipid Profile: No results for input(s): CHOL, HDL, LDLCALC, TRIG, CHOLHDL, LDLDIRECT in the last 72 hours. Thyroid Function Tests: Recent Labs    03/11/21 0530  TSH 1.990  FREET4 0.82   Anemia Panel: No results for input(s): VITAMINB12, FOLATE, FERRITIN, TIBC, IRON, RETICCTPCT in the last 72 hours. Urine analysis:    Component Value Date/Time   COLORURINE YELLOW (A) 03/11/2021 0530   APPEARANCEUR HAZY (A) 03/11/2021 0530   APPEARANCEUR Clear 10/24/2014 0818   LABSPEC 1.030 03/11/2021 0530   LABSPEC 1.023 10/24/2014 0818   PHURINE 5.0 03/11/2021 0530   GLUCOSEU NEGATIVE 03/11/2021 0530   GLUCOSEU Negative 10/24/2014 0818   HGBUR NEGATIVE 03/11/2021 0530   BILIRUBINUR NEGATIVE 03/11/2021 0530   BILIRUBINUR Negative 10/24/2014 0818   KETONESUR 5 (A) 03/11/2021 0530   PROTEINUR NEGATIVE 03/11/2021 0530   UROBILINOGEN 0.2 03/25/2015 1203   NITRITE NEGATIVE 03/11/2021 0530   LEUKOCYTESUR LARGE (A) 03/11/2021 0530   LEUKOCYTESUR Negative 10/24/2014 0818    Radiological Exams on Admission: DG Chest Port 1 View  Result Date: 03/11/2021 CLINICAL DATA:  60 year old female with bradycardia. Dizziness and nausea since yesterday. EXAM: PORTABLE CHEST 1 VIEW COMPARISON:  Chest radiographs 06/28/2016 and earlier. FINDINGS: Portable AP upright view at 0451  hours. Cardiac size is at the upper limits of normal to mildly  enlarged. Other mediastinal contours are within normal limits. Visualized tracheal air column is within normal limits. Allowing for portable technique the lungs are clear. No pneumothorax or pleural effusion. No acute osseous abnormality identified. Acromioclavicular degeneration greater on the right. IMPRESSION: Borderline to mild cardiomegaly. No acute cardiopulmonary abnormality. Electronically Signed   By: Genevie Ann M.D.   On: 03/11/2021 05:19    EKG: Independently reviewed  Assessment/Plan   Symptomatic bradycardia -associated orthostasis -ekg sinus brady no high grade blocks  -avoid nodal blocking agents - ? Sss/ plan for pacemaker  placement  -repeat echo  -f/u further cardiology recs  Afib -rate controlled -avoiding all nodal blocking agents due to SSS -further discuss by cardiology re anticoag  UTI -continue CTX -f/u culture  COPD Asthma -continue  albuterol inhaler   Anxiety -continue on ssri   GERD -ppi   hx of DVT /PE 2014  -s/p tx with warfarin -patient requested d/c of warfarin since she had been on med  since 2014 -heme currently completing anticoagulation before  discontinuing , patient off warfarin for these test   HTN -hold bp medications currently due to + orthostasis    OSA  -need f/u as out patient to adjust settings.  Mild thrombocytopenia -monitor labs base was in mid 200's now mid 100's  Prior hx of DMII  DVT prophylaxis: scd Code Status:  full Family Communication:  no family at beside  Disposition Plan: patient  expected to be admitted less than 2 midnights  Consults called: cardiology  Admission status: obs  Clance Boll MD [facility]  If 7PM-7AM, please contact night-coverage [email or web address] Password [email or web address]  03/11/2021, 7:31 AM

## 2021-03-11 NOTE — Progress Notes (Signed)
PHARMACIST - PHYSICIAN COMMUNICATION  CONCERNING:  Enoxaparin (Lovenox) for DVT Prophylaxis    RECOMMENDATION: Patient was prescribed enoxaprin '40mg'$  q24 hours for VTE prophylaxis.   There were no vitals filed for this visit.  There is no height or weight on file to calculate BMI.  Estimated Creatinine Clearance: 84.3 mL/min (by C-G formula based on SCr of 0.86 mg/dL).   Based on [place] patient is candidate for enoxaparin 0.'5mg'$ /kg TBW SQ every 24 hours based on BMI being >30.  DESCRIPTION: Pharmacy has adjusted enoxaparin dose per [facility] policy.  Patient is now receiving enoxaparin 47.5 mg every 24 hours    Berta Minor, PharmD Clinical Pharmacist  03/11/2021 8:34 AM

## 2021-03-11 NOTE — ED Provider Notes (Signed)
[facility] Emergency Department Provider Note   ____________________________________________   Event Date/Time   First MD Initiated Contact with Patient 03/11/21 [id number]     (approximate)  I have reviewed the triage vital signs and the nursing notes.   HISTORY  Chief Complaint Bradycardia    HPI Stacey [name] is a 60 y.o. female who presents to the ED from home with a chief complaint of nausea, dizziness and low heart rate.  Patient with a history of COPD, hypertension, DVT/PE taken off Coumadin 3 weeks ago by her oncologist who has noted her heart rate to be low for the past 2 weeks.  States her baseline heart rate is in the 70s.  She is not on a beta-blocker.  Denies fever, cough, chest pain, shortness of breath, abdominal pain, vomiting or diarrhea.     Past Medical History:  Diagnosis Date   Acid reflux    Anxiety    Arthritis    Asthma    Benign lipomatous neoplasm of skin, subcu of right leg 02/17/2013   Bilateral knee pain    C. difficile colitis 11/08/2011   Cervicalgia    Chronic hip pain    Chronic thoracic spine pain    Chronic, continuous use of opioids    COPD (chronic obstructive pulmonary disease) ([place])    Cyst    pt has "cyst" to legs for years, areas drain at time   Depression    Diverticulitis 2016   DVT (deep venous thrombosis) ([place]) 2014   Hair loss    Hypertension    Incisional hernia    in C-section incision   Lumbar spine pain    Musculoskeletal pain 11/08/2011   Neuropathic pain 11/10/2011   Pancreatitis 1991   Pulmonary embolism ([place]) 2014   Sleep apnea with use of continuous positive airway pressure (CPAP)    uses CPAP at night   Type 2 diabetes mellitus ([place])    Vitamin D deficiency    Warfarin anticoagulation     Patient Active Problem List   Diagnosis Date Noted   Vaginal bleeding, abnormal 05/28/2018   Diplopia 12/08/2017   Carpal tunnel syndrome 12/08/2017   Lower extremity edema 12/08/2017   GERD  (gastroesophageal reflux disease) 11/08/2017   History of DVT (deep vein thrombosis) 07/08/2017   Mixed hyperlipidemia 07/08/2017   Microalbuminuria 05/24/2017   Long term (current) use of anticoagulants 05/11/2017   Eye pain 05/10/2017   TIA (transient ischemic attack) 11/15/2015   LVH (left ventricular hypertrophy) due to hypertensive disease, without heart failure 06/20/2014   Moderate mitral insufficiency 06/20/2014   PSVT (paroxysmal supraventricular tachycardia) ([place]) 06/20/2014   History of gastric ulcer 03/04/2014   Primary osteoarthritis of right hip 03/04/2014   Pulmonary embolism 07/16/2013   Type 2 diabetes mellitus without complications ([place]) 0000000   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 Authorizing Provider  buPROPion (WELLBUTRIN XL) 300  MG 24 hr tablet Take 300 mg by mouth daily.     [provider]  busPIRone (BUSPAR) 15 MG tablet Take 2 tablets (30 mg total) by mouth 2 (two) times daily. 08/15/13   Dixon Boos, MD  escitalopram (LEXAPRO) 20 MG tablet Take 20 mg by mouth daily.     [provider]  LORazepam (ATIVAN) 2 MG tablet Take 1 tablet (2 mg total) by mouth 3 (three) times daily as needed for anxiety. Patient taking differently: Take 1 mg by mouth 2 (two) times daily. 04/05/17   Bacigalupo, Dionne Bucy, MD  nystatin cream (MYCOSTATIN) Apply 1 application topically 2 (two) times daily as needed  (candidal rash). 12/08/17   Bacigalupo, Dionne Bucy, MD  PROAIR HFA 108 [phone] Base) MCG/ACT inhaler INHALE TWO PUFFS BY MOUTH EVERY FOUR HOURS AS NEEDED FOR SHORTNESS OFBREATH OR WHEEZING 03/07/18   Bacigalupo, Dionne Bucy, MD  triamcinolone cream (KENALOG) 0.1 % Apply 1 application topically 2 (two) times daily. 06/15/19   Tasia Catchings, Amy V, PA-C  warfarin (COUMADIN) 10 MG tablet TAKE 1 TABLET BY MOUTH ONCE DAILY 10/17/18   Kris Hartmann, NP  lisinopril (PRINIVIL,ZESTRIL) 5 MG tablet Take 1 tablet (5 mg total) by mouth daily. 05/24/17 06/15/19  Virginia Crews, MD  promethazine (PHENERGAN) 25 MG tablet Take 1 tablet (25 mg total) by mouth every 6 (six) hours as needed for nausea or vomiting. 05/18/18 06/15/19  Evalee Jefferson, PA-C    Allergies Hydrocodone, Nitroglycerin, Tramadol, Zofran [ondansetron hcl], Hydromorphone, Levaquin [levofloxacin in d5w], Toradol [ketorolac tromethamine], Shellfish allergy, Levofloxacin, Sulfa antibiotics, Tape, and Trazodone and nefazodone  Family History  Problem Relation Age of Onset   Stroke Other    Cancer Other    Diabetes Other    Heart disease Mother    Diabetes Mother    Cancer Father        lung   Diabetes Sister    Diabetes Brother    Cancer Maternal Aunt    Alcohol abuse Maternal Uncle    Cancer Maternal Grandfather    Diabetes Brother     Social History Social History   Tobacco Use   Smoking status: Former    Years: 2.00    Types: Cigarettes    Quit date: 2003    Years since quitting: 19.6   Smokeless tobacco: Never   Tobacco comments:    former light cigarette smoker  Vaping Use   Vaping Use: Never used  Substance Use Topics   Alcohol use: No   Drug use: No    Review of Systems  Constitutional: No fever/chills Eyes: No visual changes. ENT: No sore throat. Cardiovascular: Positive for low heart rate.  Denies chest pain. Respiratory: Denies shortness of breath. Gastrointestinal: No abdominal pain.  Positive for nausea, no vomiting.  No  diarrhea.  No constipation. Genitourinary: Negative for dysuria. Musculoskeletal: Negative for back pain. Skin: Negative for rash. Neurological: Positive for dizziness.  Negative for headaches, focal weakness or numbness.   ____________________________________________   PHYSICAL EXAM:  VITAL SIGNS: ED Triage Vitals  Enc Vitals Group     BP 03/11/21 0301 (!) 151/56     Pulse Rate 03/11/21 0257 (!) 58     Resp 03/11/21 0301 16     Temp 03/11/21 0301 98 F (36.7 C)     Temp Source 03/11/21 0301 Oral     SpO2 03/11/21 0257 100 %     Weight --      Height --  Head Circumference --      Peak Flow --      Pain Score 03/11/21 0301 0     Pain Loc --      Pain Edu? --      Excl. in [place]? --     Constitutional: Alert and oriented. Well appearing and in mild acute distress. Eyes: Conjunctivae are normal. PERRL. EOMI. Head: Atraumatic. Nose: No congestion/rhinnorhea. Mouth/Throat: Mucous membranes are mildly dry. Neck: No stridor.  No thyromegaly. Cardiovascular: Bradycardic rate, regular rhythm. Grossly normal heart sounds.  Good peripheral circulation. Respiratory: Normal respiratory effort.  No retractions. Lungs CTAB. Gastrointestinal: Soft and nontender to light or deep palpation. No distention. No abdominal bruits. No CVA tenderness. Musculoskeletal: No lower extremity tenderness nor edema.  No joint effusions. Neurologic:  Normal speech and language. No gross focal neurologic deficits are appreciated. No gait instability. Skin:  Skin is warm, dry and intact. No rash noted. Psychiatric: Mood and affect are normal. Speech and behavior are normal.  ____________________________________________   LABS (all labs ordered are listed, but only abnormal results are displayed)  Labs Reviewed  CBC WITH DIFFERENTIAL/PLATELET - Abnormal; Notable for the following components:      Result Value   RBC 3.81 (*)    Hemoglobin 11.4 (*)    HCT 34.1 (*)    Platelets 143 (*)    All  other components within normal limits  COMPREHENSIVE METABOLIC PANEL - Abnormal; Notable for the following components:   BUN 26 (*)    Total Protein 6.3 (*)    All other components within normal limits  URINALYSIS, COMPLETE (UACMP) WITH MICROSCOPIC - Abnormal; Notable for the following components:   Color, Urine YELLOW (*)    APPearance HAZY (*)    Ketones, ur 5 (*)    Leukocytes,Ua LARGE (*)    Bacteria, UA RARE (*)    All other components within normal limits  RESP PANEL BY RT-PCR (FLU A&B, COVID) ARPGX2  URINE CULTURE  PROTIME-INR  TSH  T4, FREE  TROPONIN I (HIGH SENSITIVITY)  TROPONIN I (HIGH SENSITIVITY)   ____________________________________________  EKG  ED ECG REPORT I, Luisdavid Hamblin J, the attending physician, personally viewed and interpreted this ECG.   Date: 03/11/2021  EKG Time: 0118  Rate: 47  Rhythm: sinus bradycardia  Axis: Normal  Intervals:none  ST&T Change: Nonspecific   ED ECG REPORT I, Jameka Ivie J, the attending physician, personally viewed and interpreted this ECG.   Date: 03/11/2021  EKG Time: 0541  Rate: 38  Rhythm: sinus bradycardia  Axis: Normal  Intervals:none  ST&T Change: Nonspecific   ____________________________________________  RADIOLOGY I, Adal Sereno J, personally viewed and evaluated these images (plain radiographs) as part of my medical decision making, as well as reviewing the written report by the radiologist.  ED MD interpretation: Mild cardiomegaly  Official radiology report(s): DG Chest Port 1 View  Result Date: 03/11/2021 CLINICAL DATA:  59 year old female with bradycardia. Dizziness and nausea since yesterday. EXAM: PORTABLE CHEST 1 VIEW COMPARISON:  Chest radiographs 06/28/2016 and earlier. FINDINGS: Portable AP upright view at 0451 hours. Cardiac size is at the upper limits of normal to mildly enlarged. Other mediastinal contours are within normal limits. Visualized tracheal air column is within normal limits. Allowing  for portable technique the lungs are clear. No pneumothorax or pleural effusion. No acute osseous abnormality identified. Acromioclavicular degeneration greater on the right. IMPRESSION: Borderline to mild cardiomegaly. No acute cardiopulmonary abnormality. Electronically Signed   By: Genevie Ann M.D.   On: 03/11/2021  05:19    ____________________________________________   PROCEDURES  Procedure(s) performed (including Critical Care):  .1-3 Lead EKG Interpretation  Date/Time: 03/11/2021 5:00 AM Performed by: Paulette Blanch, MD Authorized by: Paulette Blanch, MD     Interpretation: abnormal     ECG rate:  43   ECG rate assessment: bradycardic     Rhythm: sinus bradycardia     Ectopy: none     Conduction: normal   Comments:     Patient placed on cardiac monitor to evaluate for arrhythmias  CRITICAL CARE Performed by: Paulette Blanch   Total critical care time: 30 minutes  Critical care time was exclusive of separately billable procedures and treating other patients.  Critical care was necessary to treat or prevent imminent or life-threatening deterioration.  Critical care was time spent personally by me on the following activities: development of treatment plan with patient and/or surrogate as well as nursing, discussions with consultants, evaluation of patient's response to treatment, examination of patient, obtaining history from patient or surrogate, ordering and performing treatments and interventions, ordering and review of laboratory studies, ordering and review of radiographic studies, pulse oximetry and re-evaluation of patient's condition.  ____________________________________________   INITIAL IMPRESSION / ASSESSMENT AND PLAN / ED COURSE  As part of my medical decision making, I reviewed the following data within the [place] notes reviewed and incorporated, Labs reviewed, EKG interpreted, Old chart reviewed, Radiograph reviewed, and Notes from prior ED  visits     60 year old female presenting with nausea, dizziness and low heart rate. Differential diagnosis includes, but is not limited to, ACS, aortic dissection, pulmonary embolism, cardiac tamponade, pneumothorax, pneumonia, pericarditis, myocarditis, GI-related causes including esophagitis/gastritis, and musculoskeletal chest wall pain.     Patient initially went to [facility] but did not stay.  I personally reviewed the lab work she had done there.  Lab work did not include troponin, INR or thyroid panel.  Will check these, orthostatics, 3 page rhythm strip.  Initiate IV fluid hydration.  Anticipate hospitalization.  Clinical Course as of 03/11/21 0627  Thu Mar 11, 2021  0546 Orthostatics + [JS]  [id number] 3 page rhythm strip noted which demonstrates sinus bradycardia at a rate of 38.  Pacer pads placed. [JS]  [id number] Large leukocyte positive UTI; will start IV Rocephin.  Will discuss with hospitalist services for admission. [JS]    Clinical Course User Index [JS] Paulette Blanch, MD     ____________________________________________   FINAL CLINICAL IMPRESSION(S) / ED DIAGNOSES  Final diagnoses:  Symptomatic bradycardia  Dizziness  Orthostasis  Urinary tract infection without hematuria, site unspecified     ED Discharge Orders     None        Note:  This document was prepared using Dragon voice recognition software and may include unintentional dictation errors.    Paulette Blanch, MD 03/11/21 [phone]

## 2021-03-11 NOTE — Consult Note (Signed)
CARDIOLOGY CONSULT NOTE               Patient ID: Stacey [name] MRN: [id number] DOB/AGE: [birth year] 60 y.o.  Admit date: 03/11/2021 Referring Physician: Clance Boll, MD Primary Physician: Barnabas Lister, MD  Primary Cardiologist: Flossie Dibble, MD  Reason for Consultation: symptomatic bradycardia  HPI: Stacey [name] is a 60 year old female with PMH significant for paroxysmal atrial fibrillation (recently taken off of coumadin by Oncology), HTN, HLD, h/o TIA, h/o DVT/PE, COPD, obesity and sleep apnea who presents to the ED with c/o dizziness and bradycardia.   The patient states that over the few days she has been experiencing ongoing dizziness.  The patient denies having any syncope, LOC or falls related to the dizziness.  The patient does report having an episode of weakness on last night but states that the weakness has resolved at this time.  The patient reports that over the past year she has been noticing that her heart rate has been "low" and that her oncologist recently noticed that hit was down to the high 30's to low 40's bpm. The patient denies any beta blocker use or use of any heart rate lowering medications.  The patient denies having any chest pain, dyspnea, peripheral edema or palpitations at this time.  She does report having sleep apnea and states that she has not been compliant with her CPAP machine recently since she has lost a significant amount of weight recently.    ED course: The patient arrived to the ED via EMS with complaints of bradycardia, dizziness and nauseousness.  ECG reveals sinus bradycardia.  High-sensitivity troponin was unremarkable at 6 >>6. CXR revealed borderline cardiomegaly but no acute cardiopulmonary disease.  COVID-19 panel was negative and the patient had minor electrolyte abnormalities.  The patient was given 1 dose of atropine 0.5 mg IV and started on IV fluids.  Urinalysis was remarkable for leukocytosis.  The patient was  started on empiric antibiotic therapy.  Review of systems complete and found to be negative unless listed above    Past Medical History:  Diagnosis Date   Acid reflux    Anxiety    Arthritis    Asthma    Benign lipomatous neoplasm of skin, subcu of right leg 02/17/2013   Bilateral knee pain    C. difficile colitis 11/08/2011   Cervicalgia    Chronic hip pain    Chronic thoracic spine pain    Chronic, continuous use of opioids    COPD (chronic obstructive pulmonary disease) ([place])    Cyst    pt has "cyst" to legs for years, areas drain at time   Depression    Diverticulitis 2016   DVT (deep venous thrombosis) ([place]) 2014   Hair loss    Hypertension    Incisional hernia    in C-section incision   Lumbar spine pain    Musculoskeletal pain 11/08/2011   Neuropathic pain 11/10/2011   Pancreatitis 1991   Pulmonary embolism ([place]) 2014   Sleep apnea with use of continuous positive airway pressure (CPAP)    uses CPAP at night   Type 2 diabetes mellitus ([place])    Vitamin D deficiency    Warfarin anticoagulation     Past Surgical History:  Procedure Laterality Date   CESAREAN SECTION     x2   CHOLECYSTECTOMY  1991   laproscopic   NOSE SURGERY  1982   SKIN BIOPSY     TONSILLECTOMY  1980    (Not  in a hospital admission)  Social History   Socioeconomic History   Marital status: Divorced    Spouse name: Not on file   Number of children: 2   Years of education: HS   Highest education level: Not on file  Occupational History   Occupation: disability  Tobacco Use   Smoking status: Former    Years: 2.00    Types: Cigarettes    Quit date: 2003    Years since quitting: 19.6   Smokeless tobacco: Never   Tobacco comments:    former light cigarette smoker  Vaping Use   Vaping Use: Never used  Substance and Sexual Activity   Alcohol use: No   Drug use: No   Sexual activity: Not Currently  Other Topics Concern   Not on file  Social History Narrative   Not on file   Social  Determinants of Health   Financial Resource Strain: Not on file  Food Insecurity: Not on file  Transportation Needs: Not on file  Physical Activity: Not on file  Stress: Not on file  Social Connections: Not on file  Intimate Partner Violence: Not on file    Family History  Problem Relation Age of Onset   Stroke Other    Cancer Other    Diabetes Other    Heart disease Mother    Diabetes Mother    Cancer Father        lung   Diabetes Sister    Diabetes Brother    Cancer Maternal Aunt    Alcohol abuse Maternal Uncle    Cancer Maternal Grandfather    Diabetes Brother       Review of systems complete and found to be negative unless listed above      PHYSICAL EXAM  General: Well developed, well nourished, in no acute distress HEENT:  Normocephalic and atraumatic. PERRL Neck:  No JVD.  Lungs: Clear bilaterally to auscultation.  Chest expansion symmetrical, no wheezes rales or rhonchi Heart: Heart rhythm regular. Normal S1 and S2 without gallops or murmurs.  Abdomen: Bowel sounds are positive, abdomen soft and non-tender, obese Msk:  Normal strength and tone for age. Extremities: No clubbing, cyanosis or edema.   Neuro: Alert and oriented X 3. Psych:  Good affect, responds appropriately  Labs:   Lab Results  Component Value Date   WBC 3.8 (L) 03/11/2021   HGB 11.5 (L) 03/11/2021   HCT 34.4 (L) 03/11/2021   MCV 91.0 03/11/2021   PLT 150 03/11/2021    Recent Labs  Lab 03/11/21 0530 03/11/21 0843  NA 139  --   K 3.5  --   CL 109  --   CO2 24  --   BUN 26*  --   CREATININE 0.86 0.76  CALCIUM 9.4  --   PROT 6.3*  --   BILITOT 0.7  --   ALKPHOS 51  --   ALT 20  --   AST 21  --   GLUCOSE 87  --    Lab Results  Component Value Date   CKTOTAL 38 05/23/2014   CKMB < 0.5 (L) 05/23/2014   TROPONINI <0.03 06/24/2016    Lab Results  Component Value Date   CHOL 260 (H) 11/15/2015   CHOL 245 (H) 08/14/2013   Lab Results  Component Value Date   HDL 44  11/15/2015   HDL 46 08/14/2013   Lab Results  Component Value Date   LDLCALC 163 (H) 11/15/2015   LDLCALC 173 (H) 08/14/2013  Lab Results  Component Value Date   TRIG 263 (H) 11/15/2015   TRIG 132 08/14/2013   Lab Results  Component Value Date   CHOLHDL 5.9 11/15/2015   CHOLHDL 5.3 08/14/2013   No results found for: LDLDIRECT    Radiology: [facility] Chest Port 1 View  Result Date: 03/11/2021 CLINICAL DATA:  60 year old female with bradycardia. Dizziness and nausea since yesterday. EXAM: PORTABLE CHEST 1 VIEW COMPARISON:  Chest radiographs 06/28/2016 and earlier. FINDINGS: Portable AP upright view at 0451 hours. Cardiac size is at the upper limits of normal to mildly enlarged. Other mediastinal contours are within normal limits. Visualized tracheal air column is within normal limits. Allowing for portable technique the lungs are clear. No pneumothorax or pleural effusion. No acute osseous abnormality identified. Acromioclavicular degeneration greater on the right. IMPRESSION: Borderline to mild cardiomegaly. No acute cardiopulmonary abnormality. Electronically Signed   By: Genevie Ann M.D.   On: 03/11/2021 05:19    EKG: Sinus Bradycardia with 1st degree AV block  ASSESSMENT AND PLAN:  Mrs. Satterlee is a 60 year old female with PMH significant for paroxysmal atrial fibrillation (recently taken off of coumadin by Oncology), HTN, HLD, h/o TIA, h/o DVT/PE, COPD, obesity and sleep apnea who presents to the ED with c/o dizziness and bradycardia confirmed by ECG.  ECG reveals normal sinus rhythm with first-degree AV block.  The patient's high-sensitivity troponin was unremarkable and CXR was also unremarkable.  The patient has not had any recent noninvasive cardiac diagnostic testing and this echocardiogram is recommended at this time.  Pacemaker placement should be considered in this patient; however, patient is hesitant about pacemaker placement at this time; therefore, we will observe the patient  overnight to see if symptoms and heart rate improve. However, if the dizziness continues and the patient continues to be bradycardic, then we would need to strongly consider permanent pacemaker placement or Micra leadless pacemaker placement during inpatient stay.  Symptomatic bradycardia with complaints of dizziness, fairly stable at this time Agree with admit to cardiac telemetry unit. Agree with echocardiogram for further evaluation of LV function and EF. Do not give any AV nodal blocking agents or any medications that can lower the heart rate. Continuous telemetry monitoring until discharge. Ambulate in room to as tolerated with assistance. We will consider permanent pacemaker placement or Micra leadless pacemaker placement after overnight observation.  Paroxysmal atrial fibrillation, reasonably controlled, patient's in sinus bradycardia at this time confirmed by ECG, patient was recently taken off of Coumadin therapy for unknown reason by her oncologist Agree with DVT prophylaxis per pharmacy recommendation. We will continue management as stated above.  Hypertension, reasonably controlled by diet, patient is normotensive at this time Recommend following the heart healthy, low-sodium diet. Continue to monitor blood pressure per protocol.  Hyperlipidemia, reasonably controlled by diet Recommend resuming at home doses of simvastatin 10 mg once daily.  History of DVT PE, fairly stable Agree with DVT prophylaxis with Lovenox per pharmacy recommendations.  COPD, not exacerbated Continue current management.   Sleep apnea, chronic, stable Recommend RT evaluation and CPAP therapy as indicated.  Signed: Gladstone Pih MD, PHD, [facility] 03/11/2021, 3:23 PM

## 2021-03-11 NOTE — Progress Notes (Signed)
   03/11/21 1700  Clinical Encounter Type  Visited With Patient  Visit Type Initial;Spiritual support;Social support  Referral From Nurse;Patient  Consult/Referral To Chaplain  Spiritual Encounters  Spiritual Needs Prayer;Emotional;Sacred text  Stress Factors  Patient Stress Factors Health changes;Loss of control   Chaplain responded to a request from PT that she would like to pray for her anxiety. PT was told that she was going to need a pacemaker. PT struggled with the potential outcome of this procedure. Chaplain offered emotional and spiritual support, and prayer.

## 2021-03-11 NOTE — Progress Notes (Addendum)
Pt is anxious right now and states takes ativan at home. Pt HR at 40' at this time MD Mansy made awre. Will continue to monitor.  Update 2103: MD Mansy placed order. Will continue to monitor.  Update 2200: per pt report takes albuterol at home but not order on MAR. MD Mansy made aware. Will continue to monitor.  Update 2215: MD Mansy made aware. Will continue to monitor.

## 2021-03-11 NOTE — ED Notes (Signed)
Lindsay RN aware of assigned bed 

## 2021-03-11 NOTE — ED Triage Notes (Signed)
Patient arrives to the ED via POV. Patient reports she was seen at [facility], but did not want to wait to be seen there, so she drove herself here. The patient reports dizziness, and nausea yesterday, and reports the fire department by her home told her she was HTN and had a low heart rate, and needed to be seen. The patient reports hx of bradycardia, but reports lows in the 40s are new for her. Patient denies SOB or chest pain at this time.   Patient triaged during downtime. Late documentation reflective of downtime.

## 2021-03-12 ENCOUNTER — Observation Stay: Payer: Medicaid Other

## 2021-03-12 DIAGNOSIS — Z9049 Acquired absence of other specified parts of digestive tract: Secondary | ICD-10-CM | POA: Diagnosis not present

## 2021-03-12 DIAGNOSIS — E782 Mixed hyperlipidemia: Secondary | ICD-10-CM | POA: Diagnosis present

## 2021-03-12 DIAGNOSIS — G4733 Obstructive sleep apnea (adult) (pediatric): Secondary | ICD-10-CM | POA: Diagnosis present

## 2021-03-12 DIAGNOSIS — M542 Cervicalgia: Secondary | ICD-10-CM | POA: Diagnosis present

## 2021-03-12 DIAGNOSIS — F419 Anxiety disorder, unspecified: Secondary | ICD-10-CM | POA: Diagnosis present

## 2021-03-12 DIAGNOSIS — M1990 Unspecified osteoarthritis, unspecified site: Secondary | ICD-10-CM | POA: Diagnosis present

## 2021-03-12 DIAGNOSIS — R001 Bradycardia, unspecified: Secondary | ICD-10-CM | POA: Diagnosis present

## 2021-03-12 DIAGNOSIS — D696 Thrombocytopenia, unspecified: Secondary | ICD-10-CM | POA: Diagnosis present

## 2021-03-12 DIAGNOSIS — E669 Obesity, unspecified: Secondary | ICD-10-CM | POA: Diagnosis present

## 2021-03-12 DIAGNOSIS — Z86718 Personal history of other venous thrombosis and embolism: Secondary | ICD-10-CM | POA: Diagnosis not present

## 2021-03-12 DIAGNOSIS — J449 Chronic obstructive pulmonary disease, unspecified: Secondary | ICD-10-CM | POA: Diagnosis present

## 2021-03-12 DIAGNOSIS — R42 Dizziness and giddiness: Secondary | ICD-10-CM | POA: Diagnosis present

## 2021-03-12 DIAGNOSIS — G8929 Other chronic pain: Secondary | ICD-10-CM | POA: Diagnosis present

## 2021-03-12 DIAGNOSIS — Z20822 Contact with and (suspected) exposure to covid-19: Secondary | ICD-10-CM | POA: Diagnosis present

## 2021-03-12 DIAGNOSIS — Z7901 Long term (current) use of anticoagulants: Secondary | ICD-10-CM | POA: Diagnosis not present

## 2021-03-12 DIAGNOSIS — I10 Essential (primary) hypertension: Secondary | ICD-10-CM | POA: Diagnosis present

## 2021-03-12 DIAGNOSIS — E785 Hyperlipidemia, unspecified: Secondary | ICD-10-CM | POA: Diagnosis present

## 2021-03-12 DIAGNOSIS — F339 Major depressive disorder, recurrent, unspecified: Secondary | ICD-10-CM | POA: Diagnosis present

## 2021-03-12 DIAGNOSIS — I440 Atrioventricular block, first degree: Secondary | ICD-10-CM | POA: Diagnosis present

## 2021-03-12 DIAGNOSIS — Z6837 Body mass index (BMI) 37.0-37.9, adult: Secondary | ICD-10-CM | POA: Diagnosis not present

## 2021-03-12 DIAGNOSIS — I340 Nonrheumatic mitral (valve) insufficiency: Secondary | ICD-10-CM | POA: Diagnosis present

## 2021-03-12 DIAGNOSIS — N390 Urinary tract infection, site not specified: Secondary | ICD-10-CM | POA: Diagnosis present

## 2021-03-12 DIAGNOSIS — I480 Paroxysmal atrial fibrillation: Secondary | ICD-10-CM | POA: Diagnosis present

## 2021-03-12 DIAGNOSIS — K219 Gastro-esophageal reflux disease without esophagitis: Secondary | ICD-10-CM | POA: Diagnosis present

## 2021-03-12 LAB — COMPREHENSIVE METABOLIC PANEL
ALT: 17 U/L (ref 0–44)
AST: 18 U/L (ref 15–41)
Albumin: 3.5 g/dL (ref 3.5–5.0)
Alkaline Phosphatase: 46 U/L (ref 38–126)
Anion gap: 2 — ABNORMAL LOW (ref 5–15)
BUN: 15 mg/dL (ref 6–20)
CO2: 26 mmol/L (ref 22–32)
Calcium: 9.1 mg/dL (ref 8.9–10.3)
Chloride: 116 mmol/L — ABNORMAL HIGH (ref 98–111)
Creatinine, Ser: 0.6 mg/dL (ref 0.44–1.00)
GFR, Estimated: >60 mL/min (ref >60)
Glucose, Bld: 80 mg/dL (ref 70–99)
Potassium: 3.4 mmol/L — ABNORMAL LOW (ref 3.5–5.1)
Sodium: 144 mmol/L (ref 135–145)
Total Bilirubin: 0.7 mg/dL (ref 0.3–1.2)
Total Protein: 5.7 g/dL — ABNORMAL LOW (ref 6.5–8.1)

## 2021-03-12 LAB — CBC
HCT: 32.9 % — ABNORMAL LOW (ref 36.0–46.0)
Hemoglobin: 11.2 g/dL — ABNORMAL LOW (ref 12.0–15.0)
MCH: 30.2 pg (ref 26.0–34.0)
MCHC: 34 g/dL (ref 30.0–36.0)
MCV: 88.7 fL (ref 80.0–100.0)
Platelets: 147 10*3/uL — ABNORMAL LOW (ref 150–400)
RBC: 3.71 MIL/uL — ABNORMAL LOW (ref 3.87–5.11)
RDW: 12.8 % (ref 11.5–15.5)
WBC: 4.4 10*3/uL (ref 4.0–10.5)
nRBC: 0 % (ref 0.0–0.2)

## 2021-03-12 LAB — URINE CULTURE: Culture: <10000 — AB

## 2021-03-12 MED ORDER — BUSPIRONE HCL 15 MG PO TABS
15.0000 mg | ORAL_TABLET | Freq: Two times a day (BID) | ORAL | Status: DC
  Administered 2021-03-13: 15 mg via ORAL
  Filled 2021-03-12 (×2): qty 1

## 2021-03-12 MED ORDER — MECLIZINE HCL 25 MG PO TABS
25.0000 mg | ORAL_TABLET | Freq: Three times a day (TID) | ORAL | Status: DC | PRN
  Administered 2021-03-12 – 2021-03-13 (×2): 25 mg via ORAL
  Filled 2021-03-12 (×3): qty 1

## 2021-03-12 NOTE — Plan of Care (Signed)
°  Problem: Education: °Goal: Knowledge of General Education information will improve °Description: Including pain rating scale, medication(s)/side effects and non-pharmacologic comfort measures °Outcome: Progressing °  °Problem: Clinical Measurements: °Goal: Cardiovascular complication will be avoided °Outcome: Progressing °  °Problem: Activity: °Goal: Risk for activity intolerance will decrease °Outcome: Progressing °  °

## 2021-03-12 NOTE — Progress Notes (Signed)
Patient ambulated SBA with FWW 160 feet. Heart rate at rest prior to ambulation 56, with ambulation is 84, returns to 54 at rest.   Samella Parr, RN

## 2021-03-12 NOTE — Progress Notes (Signed)
[facility] Cardiology  Patient Description:  Stacey [name] is a 60 year old female with PMH significant for paroxysmal atrial fibrillation (recently taken off of coumadin by Oncology), HTN, HLD, h/o TIA, h/o DVT/PE, COPD, obesity and sleep apnea who presents to the ED with c/o dizziness and bradycardia, thus cardiology was consulted.   SUBJECTIVE: The patient reports that she continues to have dizziness and is now experiencing some mild dyspnea upon exertion. She denies having any worsening in the dizziness, but states that overall she feels slightly better than she did on yesterday. The patient denies having any LOC or syncope. She states that she has been able to ambulate around the room, but the dizziness occurs both at rest and upon exertion. The patient also expresses concerns of left-sided neck and esophageal discomfort. She states that sometimes she has associated dysphagia and has recently underwent an endoscopy/colonoscopy via Gastroenterology.   OBJECTIVE: The patient appears to be better on today and her heart rate confirmed by at the bedside telemetry monitor is now in the lower 50s beats per minute.  The patient has been able to ambulate around the unit and her heart rate shows good chronotropic response per documentation during ambulation the patient's heart rate was around 84 bpm and returned back to 54 beats per minute at rest at the conclusion of ambulation.  The patient has not had any syncopal episodes or complaints of fatigue.  The patient's echocardiogram reveals normal LV systolic function with estimated EF of 60 to 65%.  Per consulting with electrophysiology the patient symptoms of dizziness is likely not due to her bradycardia but possibly due to vertigo.  EP recommendations are to defer pacer at this time and place a 30-day cardiac event monitor for further evaluation as an outpatient.  Vitals:   03/12/21 0041 03/12/21 0324 03/12/21 0803 03/12/21 1130  BP: (!) 116/48 (!) 145/52 (!) 144/58  (!) 138/57  Pulse:  (!) 42 (!) 45 (!) 47  Resp: '18 15 17 16  '$ Temp: 98.4 F (36.9 C) 97.8 F (36.6 C) 97.8 F (36.6 C) 98.1 F (36.7 C)  TempSrc: Oral     SpO2: 100% 99% 99% 100%  Weight:      Height:         Intake/Output Summary (Last 24 hours) at 03/12/2021 1159 Last data filed at 03/12/2021 0600 Gross per 24 hour  Intake 1841.46 ml  Output 3200 ml  Net -1358.54 ml      PHYSICAL EXAM  General: Well developed, well nourished, in no acute distress HEENT:  Normocephalic and atraumatic. PERRL Neck:   No JVD.  Lungs: Clear bilaterally to auscultation.  Chest expansion symmetrical, no wheezes rales or rhonchi Heart: Heart rhythm regular. Normal S1 and S2 without gallops or murmurs.  Abdomen: Bowel sounds are positive, abdomen soft and non-tender, obese Msk:  Normal strength and tone for age. Extremities: No clubbing, cyanosis or edema.   Neuro: Alert and oriented X 3. Psych:  Good affect, responds appropriately   LABS: Basic Metabolic Panel: Recent Labs    03/10/21 2307 03/11/21 0530 03/11/21 0843 03/12/21 0527  NA 140 139  --  144  K 3.5 3.5  --  3.4*  CL 112* 109  --  116*  CO2 24 24  --  26  GLUCOSE 96 87  --  80  BUN 21* 26*  --  15  CREATININE 0.81 0.86 0.76 0.60  CALCIUM 9.2 9.4  --  9.1  MG 2.0  --   --   --  PHOS 2.4*  --   --   --    Liver Function Tests: Recent Labs    03/11/21 0530 03/12/21 0527  AST 21 18  ALT 20 17  ALKPHOS 51 46  BILITOT 0.7 0.7  PROT 6.3* 5.7*  ALBUMIN 3.9 3.5   No results for input(s): LIPASE, AMYLASE in the last 72 hours. CBC: Recent Labs    03/10/21 2307 03/11/21 0530 03/11/21 0843 03/12/21 0527  WBC 5.4 4.9 3.8* 4.4  NEUTROABS 2.2 2.0  --   --   HGB 11.4* 11.4* 11.5* 11.2*  HCT 34.7* 34.1* 34.4* 32.9*  MCV 90.1 89.5 91.0 88.7  PLT 161 143* 150 147*   Cardiac Enzymes: No results for input(s): CKTOTAL, CKMB, CKMBINDEX, TROPONINI in the last 72 hours. BNP: Invalid input(s): POCBNP D-Dimer: No results  for input(s): DDIMER in the last 72 hours. Hemoglobin A1C: Recent Labs    03/11/21 0843  HGBA1C 4.9   Fasting Lipid Panel: No results for input(s): CHOL, HDL, LDLCALC, TRIG, CHOLHDL, LDLDIRECT in the last 72 hours. Thyroid Function Tests: Recent Labs    03/11/21 0530  TSH 1.990   Anemia Panel: No results for input(s): VITAMINB12, FOLATE, FERRITIN, TIBC, IRON, RETICCTPCT in the last 72 hours.  DG Chest Port 1 View  Result Date: 03/11/2021 CLINICAL DATA:  60 year old female with bradycardia. Dizziness and nausea since yesterday. EXAM: PORTABLE CHEST 1 VIEW COMPARISON:  Chest radiographs 06/28/2016 and earlier. FINDINGS: Portable AP upright view at 0451 hours. Cardiac size is at the upper limits of normal to mildly enlarged. Other mediastinal contours are within normal limits. Visualized tracheal air column is within normal limits. Allowing for portable technique the lungs are clear. No pneumothorax or pleural effusion. No acute osseous abnormality identified. Acromioclavicular degeneration greater on the right. IMPRESSION: Borderline to mild cardiomegaly. No acute cardiopulmonary abnormality. Electronically Signed   By: Genevie Ann M.D.   On: 03/11/2021 05:19   ECHOCARDIOGRAM COMPLETE  Result Date: 03/11/2021    ECHOCARDIOGRAM REPORT   Patient Name:   Stacey [name] Date of Exam: 03/11/2021 Medical Rec #:  [id number]        Height:       66.0 in Accession #:    [id number]       Weight:       211.6 lb Date of Birth:  Dec 09, 1960       BSA:          2.048 m Patient Age:    72 years         BP:           117/56 mmHg Patient Gender: F                HR:           43 bpm. Exam Location:  [facility] Procedure: 2D Echo, Cardiac Doppler and Color Doppler Indications:     Other abnormalities of the heart R00.8  History:         Patient has prior history of Echocardiogram examinations, most                  recent 11/17/2015. COPD; Risk Factors:Diabetes. DVT.  Sonographer:     Sherrie Sport RDCS (AE) Referring Phys:   [id number] Victoriano Lain A THOMAS Diagnosing Phys: Yolonda Kida MD IMPRESSIONS  1. Left ventricular ejection fraction, by estimation, is 60 to 65%. The left ventricle has normal function. The left ventricle has no regional wall motion abnormalities. There is mild left ventricular  hypertrophy. Left ventricular diastolic parameters were normal.  2. Right ventricular systolic function is normal. The right ventricular size is normal.  3. The mitral valve is normal in structure. No evidence of mitral valve regurgitation.  4. The aortic valve is normal in structure. Aortic valve regurgitation is not visualized. FINDINGS  Left Ventricle: Left ventricular ejection fraction, by estimation, is 60 to 65%. The left ventricle has normal function. The left ventricle has no regional wall motion abnormalities. The left ventricular internal cavity size was normal in size. There is  mild left ventricular hypertrophy. Left ventricular diastolic parameters were normal. Right Ventricle: The right ventricular size is normal. No increase in right ventricular wall thickness. Right ventricular systolic function is normal. Left Atrium: Left atrial size was normal in size. Right Atrium: Right atrial size was normal in size. Pericardium: There is no evidence of pericardial effusion. Mitral Valve: The mitral valve is normal in structure. No evidence of mitral valve regurgitation. Tricuspid Valve: The tricuspid valve is normal in structure. Tricuspid valve regurgitation is trivial. Aortic Valve: The aortic valve is normal in structure. Aortic valve regurgitation is not visualized. Aortic valve mean gradient measures 3.0 mmHg. Aortic valve peak gradient measures 5.4 mmHg. Aortic valve area, by VTI measures 2.79 cm. Pulmonic Valve: The pulmonic valve was normal in structure. Pulmonic valve regurgitation is not visualized. Aorta: The ascending aorta was not well visualized. IAS/Shunts: No atrial level shunt detected by color flow Doppler.  LEFT  VENTRICLE PLAX 2D LVIDd:         5.10 cm  Diastology LVIDs:         3.30 cm  LV e' medial:    5.22 cm/s LV PW:         1.70 cm  LV E/e' medial:  19.2 LV IVS:        1.20 cm  LV e' lateral:   11.60 cm/s LVOT diam:     2.00 cm  LV E/e' lateral: 8.6 LV SV:         73 LV SV Index:   36 LVOT Area:     3.14 cm  LEFT ATRIUM            Index       RIGHT ATRIUM           Index LA diam:      3.30 cm  1.61 cm/m  RA Area:     18.70 cm LA Vol (A2C): 115.0 ml 56.14 ml/m RA Volume:   48.80 ml  23.82 ml/m LA Vol (A4C): 32.7 ml  15.96 ml/m  AORTIC VALVE                   PULMONIC VALVE AV Area (Vmax):    2.55 cm    PV Vmax:        0.81 m/s AV Area (Vmean):   2.58 cm    PV Peak grad:   2.6 mmHg AV Area (VTI):     2.79 cm    RVOT Peak grad: 5 mmHg AV Vmax:           116.00 cm/s AV Vmean:          77.900 cm/s AV VTI:            0.261 m AV Peak Grad:      5.4 mmHg AV Mean Grad:      3.0 mmHg LVOT Vmax:         94.00 cm/s LVOT Vmean:  64.000 cm/s LVOT VTI:          0.232 m LVOT/AV VTI ratio: 0.89  AORTA Ao Root diam: 2.70 cm MITRAL VALVE                TRICUSPID VALVE MV Area (PHT): 4.46 cm     TR Peak grad:   10.8 mmHg MV Decel Time: 170 msec     TR Vmax:        164.00 cm/s MV E velocity: 100.00 cm/s MV A velocity: 76.70 cm/s   SHUNTS MV E/A ratio:  1.30         Systemic VTI:  0.23 m                             Systemic Diam: 2.00 cm Dwayne Prince Rome MD Electronically signed by Yolonda Kida MD Signature Date/Time: 03/11/2021/5:04:16 PM    Final      Echo: Normal LV systolic function with estimated EF of 60 to 65%.  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 Per EP recommendations a 30-day cardiac event monitor will be placed on today for further evaluation.   Permanent pacemaker placement not recommended at this  time. Echocardiogram was unremarkable. Do not give any AV nodal blocking agents or any medications that can lower the heart rate. Ambulation around the room and unit reveals positive chronotropic response. Patient is cleared from a cardiac perspective for discharge but should follow up with Cardiology within 1 week of discharge as an outpatient.   2. Dizziness, possibly due to undiagnosed orthostasis versus vertigo, fairly stable  A.  Recommend meclizine therapy 3 times daily as needed.  B.  Recommend monitoring blood pressure daily and consider midodrine therapy as needed.   C.  Agree with carotid ultrasound bilateral.  D.  Recommend staying adequately hydrated with water electrolytes daily, eat at least 3 small meals daily with snacks in between to avoid the risk of hypoglycemia changing positions slowly to avoid postural dizziness.   3.  Paroxysmal atrial fibrillation, reasonably controlled, patient's in sinus bradycardia at this time confirmed by ECG, patient was recently taken off of Coumadin therapy for unknown reason by her oncologist Agree with DVT prophylaxis per pharmacy recommendation. Should follow up with hematology/oncology as soon as possible so that anticoagulation can be restarted for stroke risk reduction. We will continue management as stated above.   4. Hypertension with positive orthostasis noted on orthostatic vital signs yesterday; however, orthostatic vital signs on today revealed normal blood pressure response, reasonably controlled by diet, patient is normotensive at this time Recommend following the heart healthy, low-sodium diet. Continue to monitor blood pressure per protocol.   5. Hyperlipidemia, reasonably controlled by diet Recommend resuming at home doses of simvastatin 10 mg once daily.   6. History of DVT PE, fairly stable Agree with DVT prophylaxis with Lovenox per pharmacy recommendations.   7. COPD, not exacerbated Continue current management.    8.  Sleep apnea, chronic, stable Recommend RT evaluation and CPAP therapy as indicated.  9.   Left-sided neck pain and esophageal discomfort, fairly stable  A.  Recommend considering swallow evaluation and following up with gastroenterology as an outpatient.   Deajah Erkkila, ACNPC-AG  03/12/2021 11:59 AM

## 2021-03-12 NOTE — Progress Notes (Signed)
PROGRESS NOTE  Stacey [name]  DOB: 1961-04-12  PCP: [name], NP WN:[id number]  DOA: 03/11/2021  LOS: 1 day  Hospital Day: 2   Chief Complaint  Patient presents with   Bradycardia    Brief narrative: Stacey [name] is a 60 y.o. female with PMH significant for obesity, DM2, HTN, OSA on CPAP, COPD, Asthma, Anxiety, GERD, hx of DVT /PE 2014, paroxysmal A. fib recently taken off of Coumadin by oncology. Patient presented to the ED on 8/11 with complaint of lightheadedness, nausea and low heart rate.  She has low heart rate for many years, she was last evaluated by cardiologist Dr. Brigitte Pulse at [facility] about 2 months ago.  Reportedly her echocardiogram was normal and hence no further cardiac evaluation was required. 8/10, she called EMS for persistent nausea, dizziness and low heart rate.  She was taken [facility].  She had to wait for more than an hour to be seen and hence she left AMA to come to ED at [facility].    In the ED, heart rate in 40s and 50s, blood pressure 158/55 EKG showed sinus bradycardia Troponin negative. Chest x-ray unremarkable Patient was given 1 dose of atropine 0.5 mg IV and started on IV fluid Urinalysis showed WBCs. Admitted to hospitalist service Cardiology was consulted  Subjective: Patient was seen and examined this morning.  He is a middle-aged Caucasian female.  Not in distress.  Cardiology NP was at bedside at the time of my evaluation.  Noted a plan not to pursue for pacemaker placement at this time. Patient also complained to me about 'clogged artery' feeling on the left side of the neck.  She states he had EGD done as an outpatient which was unremarkable. Overnight, heart rate in 40s and 50s with blood pressure in 140s Labs with potassium at 3.4  Assessment/Plan: Symptomatic bradycardia Dizziness -Reports low heart rate for many years, now with worsening dizziness, lightheadedness  -Reportedly she had orthostatic blood  pressure drop in the ED, but I do not find the documentation of that -EKG with sinus bradycardia, 1 dose of atropine IV was given in the ED -Currently not on AV nodal blocking agents. -Cardiology consult appreciated.  Per cardiology, she has good chronotropic response to exertion.  No need of pacemaker placement at this time.  Meclizine as needed was started.  Feeling of tightness in the left side of the neck -Reports'clogged artery' feeling on the left side of the neck.  She states he had EGD done as an outpatient which was unremarkable.  In setting of worsening dizziness, may need to rule out carotid artery stenosis.  Obtain ultrasound duplex.  History of paroxysmal A. Fib -Apparently recently taken off anticoagulation by oncologist.  hx of DVT /PE 2014  -s/p tx with warfarin, recently DC'd by oncologist.  Essential hypertension -Not on blood pressure meds at home.  Hyperlipidemia -Simvastatin 10 mg once daily  Suspected UTI -?  Symptoms.  Urine with leukocytes.  Currently on ceftriaxone.   -f/u culture  Obesity  -Body mass index is 37.5 kg/m. Patient has been advised to make an attempt to improve diet and exercise patterns to aid in weight loss. -Reports intentional weight loss of 260 pounds in last few months  COPD/Asthma -continue  albuterol inhaler    Anxiety -continue Wellbutrin, BuSpar, Lexapro, as needed Ativan   GERD -ppi   OSA  -need f/u as out patient to adjust settings.   Mild thrombocytopenia -monitor labs base was  in mid 200's now mid 100's Recent Labs  Lab 03/10/21 2307 03/11/21 0530 03/11/21 0843 03/12/21 0527  PLT 161 143* 150 147*   Mobility: Encourage ambulation Code Status:   Code Status: Full Code  Nutritional status: Body mass index is 37.5 kg/m.     Diet:  Diet Order             Diet heart healthy/carb modified Room service appropriate? Yes; Fluid consistency: Thin  Diet effective now                  DVT prophylaxis:  Lovenox subcu    Antimicrobials: Empirically IV Rocephin Fluid: Normal saline at 75 mill per hour Consultants: Cardiology Family Communication: None at bedside  Status is: Observation  Remains inpatient appropriate because: Needs overnight telemetry monitoring, need to see response to meclizine, pending carotid duplex  Dispo: The patient is from: Home              Anticipated d/c is to: Home              Patient currently is not medically stable to d/c.   Difficult to place patient No     Infusions:   sodium chloride 75 mL/hr at 03/12/21 1314   cefTRIAXone (ROCEPHIN)  IV Stopped (03/12/21 0615)    Scheduled Meds:  buPROPion  300 mg Oral Daily   busPIRone  30 mg Oral BID   enoxaparin (LOVENOX) injection  0.5 mg/kg Subcutaneous Q24H   escitalopram  20 mg Oral Daily   simvastatin  10 mg Oral QHS   sodium chloride flush  3 mL Intravenous Q12H    Antimicrobials: Anti-infectives (From admission, onward)    Start     Dose/Rate Route Frequency Ordered Stop   03/12/21 0600  cefTRIAXone (ROCEPHIN) 1 g in sodium chloride 0.9 % 100 mL IVPB        1 g 200 mL/hr over 30 Minutes Intravenous Every 24 hours 03/11/21 2006     03/11/21 0630  cefTRIAXone (ROCEPHIN) 1 g in sodium chloride 0.9 % 100 mL IVPB        1 g 200 mL/hr over 30 Minutes Intravenous  Once 03/11/21 0626 03/11/21 0724       PRN meds: acetaminophen **OR** acetaminophen, albuterol, atropine, LORazepam, meclizine   Objective: Vitals:   03/12/21 1130 03/12/21 1417  BP: (!) 138/57 (!) 126/51  Pulse: (!) 47 (!) 50  Resp: 16   Temp: 98.1 F (36.7 C) 98.3 F (36.8 C)  SpO2: 100% 100%    Intake/Output Summary (Last 24 hours) at 03/12/2021 1544 Last data filed at 03/12/2021 1430 Gross per 24 hour  Intake 2518.64 ml  Output 3200 ml  Net -681.36 ml   Filed Weights   03/11/21 1540  Weight: 96 kg   Weight change:  Body mass index is 37.5 kg/m.   Physical Exam: General exam: Pleasant, middle-aged Caucasian  female.  Not in distress Skin: No rashes, lesions or ulcers. HEENT: Atraumatic, normocephalic, no obvious bleeding.  No tenderness at the site of left neck pain Lungs: Clear to auscultation bilaterally CVS: Sinus bradycardia, regular rhythm, no murmur  GI/Abd soft, nontender, nondistended, bowel sound present CNS: Alert, awake, oriented x3 Psychiatry: Mood appropriate Extremities: No pedal edema, no calf tenderness  Data Review: I have personally reviewed the laboratory data and studies available.  Recent Labs  Lab 03/10/21 2307 03/11/21 0530 03/11/21 0843 03/12/21 0527  WBC 5.4 4.9 3.8* 4.4  NEUTROABS 2.2 2.0  --   --  HGB 11.4* 11.4* 11.5* 11.2*  HCT 34.7* 34.1* 34.4* 32.9*  MCV 90.1 89.5 91.0 88.7  PLT 161 143* 150 147*   Recent Labs  Lab 03/10/21 2307 03/11/21 0530 03/11/21 0843 03/12/21 0527  NA 140 139  --  144  K 3.5 3.5  --  3.4*  CL 112* 109  --  116*  CO2 24 24  --  26  GLUCOSE 96 87  --  80  BUN 21* 26*  --  15  CREATININE 0.81 0.86 0.76 0.60  CALCIUM 9.2 9.4  --  9.1  MG 2.0  --   --   --   PHOS 2.4*  --   --   --     F/u labs ordered Unresulted Labs (From admission, onward)     Start     Ordered   03/18/21 0500  Creatinine, serum  (enoxaparin (LOVENOX)    CrCl >/= 30 ml/min)  Weekly,   STAT     Comments: while on enoxaparin therapy    03/11/21 [id number]            Signed, Terrilee Croak, MD [facility] 03/12/2021

## 2021-03-13 ENCOUNTER — Inpatient Hospital Stay: Payer: Medicaid Other

## 2021-03-13 MED ORDER — MECLIZINE HCL 25 MG PO TABS: 25.0000 mg | ORAL_TABLET | Freq: Three times a day (TID) | ORAL | 0 refills | Status: DC | PRN

## 2021-03-13 MED ORDER — LORAZEPAM 2 MG/ML IJ SOLN
1.0000 mg | Freq: Once | INTRAMUSCULAR | Status: AC
  Administered 2021-03-13: 1 mg via INTRAVENOUS
  Filled 2021-03-13: qty 1

## 2021-03-13 MED ORDER — MECLIZINE HCL 25 MG PO TABS: 25.0000 mg | ORAL_TABLET | Freq: Three times a day (TID) | ORAL | 0 refills | Status: AC | PRN

## 2021-03-13 NOTE — Discharge Summary (Signed)
Physician Discharge Summary  Stacey [name] [id number] DOB: Oct 25, 1960 DOA: 03/11/2021  PCP: [name], NP  Admit date: 03/11/2021 Discharge date: 03/13/2021  Admitted From: Home Discharge disposition: Home   Code Status: Full Code   Discharge Diagnosis:   Active Problems:   Symptomatic bradycardia   Bradycardia   Hospital Day: 3   Chief Complaint  Patient presents with   Bradycardia    Brief narrative: Stacey [name] is a 60 y.o. female with PMH significant for obesity, DM2, HTN, OSA on CPAP, COPD, Asthma, Anxiety, GERD, hx of DVT /PE 2014, paroxysmal A. fib recently taken off of Coumadin by oncology. Patient presented to the ED on 8/11 with complaint of lightheadedness, nausea and low heart rate.  She has low heart rate for many years, she was last evaluated by cardiologist Dr. Brigitte Pulse at [facility] about 2 months ago.  Reportedly her echocardiogram was normal and hence no further cardiac evaluation was required. 8/10, she called EMS for persistent nausea, dizziness and low heart rate.  She was taken to [facility] ED.  She had to wait for more than an hour to be seen and hence she left AMA to come to ED at [facility].    In the ED, heart rate in 40s and 50s, blood pressure 158/55 EKG showed sinus bradycardia Troponin negative. Chest x-ray unremarkable Patient was given 1 dose of atropine 0.5 mg IV and started on IV fluid Urinalysis showed WBCs. Admitted to hospitalist service Cardiology was consulted  Subjective: Patient was seen and examined this morning.  Sitting up at the edge of the bed.  Not in distress.  No new symptoms.  Patient still has episodes of dizziness despite started on meclizine yesterday.  Agreed to get an MRA of head to rule out posterior circulation stroke.  Hospital course Symptomatic bradycardia Dizziness -Reports low heart rate for many years, now with worsening dizziness, lightheadedness  -Reportedly she had orthostatic blood  pressure drop in the ED, but I do not find the documentation of that -EKG with sinus bradycardia, 1 dose of atropine IV was given in the ED -Currently not on AV nodal blocking agents. -Ultrasound duplex of both carotids did not show any significant stenosis. -MRA head showed normal intracranial MR angiography. -Cardiology consult appreciated.  When patient ambulated in the room, her heart rate appropriately picked up to 80s.  Per cardiology, she has good chronotropic response to exertion.  No need of pacemaker placement at this time.  Meclizine PRN was started.  Continue the same at home.  History of paroxysmal A. Fib hx of DVT /PE 2014  -Patient was on Coumadin for several years.  Per patient she was recently taken off warfarin by her oncologist to run some tests. -Not sure if her oncologist was aware of patient's diagnosis of paroxysmal A. fib. -I recommended the patient to discuss with her oncologist about reinitiation of Coumadin once the test series is completed.  Patient verbalized understanding.  Hyperlipidemia -Simvastatin 10 mg once daily  Suspected UTI -No symptoms.  Nonsignificant growth in culture.  No need to continue antibiotics.  Obesity  -Body mass index is 37.5 kg/m. Patient has been advised to make an attempt to improve diet and exercise patterns to aid in weight loss. -Reports intentional weight loss of 260 pounds in last few months  COPD/Asthma -continue  albuterol inhaler    Anxiety -continue Wellbutrin, BuSpar, Lexapro, as needed Ativan   GERD -ppi   OSA  -need f/u as out  patient to adjust settings.   Allergies as of 03/13/2021       Reactions   Hydrocodone Anaphylaxis   Nitroglycerin Anxiety   Tramadol Anaphylaxis, Swelling   Zofran [ondansetron Hcl] Anaphylaxis   Panic Attacks   Hydromorphone Other (See Comments)   Dilaudid. "hallucinations" has tolerated morphine, but "it doesn't work very well"   Levaquin [levofloxacin In D5w] Nausea And  Vomiting   Toradol [ketorolac Tromethamine] Swelling   Shellfish Allergy    Pt reports she was told by a MD/RN in a previous encounter during a CT scan with contrast that she was possibly allergic to shellfish. Pt reports she gets very sick to her stomach with IV contrast.   Levofloxacin Other (See Comments)   Sulfa Antibiotics Rash   Tape Rash   Trazodone And Nefazodone Other (See Comments)        Medication List     TAKE these medications    buPROPion 300 MG 24 hr tablet Commonly known as: WELLBUTRIN XL Take 300 mg by mouth daily.   busPIRone 15 MG tablet Commonly known as: BUSPAR Take 2 tablets (30 mg total) by mouth 2 (two) times daily.   escitalopram 20 MG tablet Commonly known as: LEXAPRO Take 20 mg by mouth daily.   Flovent HFA 220 MCG/ACT inhaler Generic drug: fluticasone Inhale 1 puff into the lungs daily at 12 noon.   LORazepam 0.5 MG tablet Commonly known as: ATIVAN Take 0.5 mg by mouth 3 (three) times daily as needed for anxiety.   meclizine 25 MG tablet Commonly known as: ANTIVERT Take 1 tablet (25 mg total) by mouth 3 (three) times daily as needed for up to 10 days for dizziness.   pregabalin 100 MG capsule Commonly known as: LYRICA Take 100 mg by mouth 2 (two) times daily.   ProAir HFA 108 (90 Base) MCG/ACT inhaler Generic drug: albuterol INHALE TWO PUFFS BY MOUTH EVERY FOUR HOURS AS NEEDED FOR SHORTNESS OFBREATH OR WHEEZING   simvastatin 10 MG tablet Commonly known as: ZOCOR Take 10 mg by mouth daily at 12 noon.   warfarin 10 MG tablet Commonly known as: COUMADIN Take as directed. If you are unsure how to take this medication, talk to your nurse or doctor. Original instructions: TAKE 1 TABLET BY MOUTH ONCE DAILY        Discharge Instructions:  Diet Recommendation: Cardiac diet   Follow with Primary MD [name], NP in 7 days   Get CBC/BMP checked in next visit within 1 week by PCP or SNF MD ( we routinely change or add  medications that can affect your baseline labs and fluid status, therefore we recommend that you get the mentioned basic workup next visit with your PCP, your PCP may decide not to get them or add new tests based on their clinical decision)  On your next visit with your PCP, please Get Medicines reviewed and adjusted.  Please request your PCP  to go over all Hospital Tests and Procedure/Radiological results at the follow up, please get all Hospital records sent to your Prim MD by signing hospital release before you go home.  Activity: As tolerated with Full fall precautions use walker/cane & assistance as needed  For Heart failure patients - Check your Weight same time everyday, if you gain over 2 pounds, or you develop in leg swelling, experience more shortness of breath or chest pain, call your Primary MD immediately. Follow Cardiac Low Salt Diet and 1.5 lit/day fluid restriction.  If you have  smoked or chewed Tobacco in the last 2 yrs please stop smoking, stop any regular Alcohol  and or any Recreational drug use.  If you experience worsening of your admission symptoms, develop shortness of breath, life threatening emergency, suicidal or homicidal thoughts you must seek medical attention immediately by calling 911 or calling your MD immediately  if symptoms less severe.  You Must read complete instructions/literature along with all the possible adverse reactions/side effects for all the Medicines you take and that have been prescribed to you. Take any new Medicines after you have completely understood and accpet all the possible adverse reactions/side effects.   Do not drive, operate heavy machinery, perform activities at heights, swimming or participation in water activities or provide baby sitting services if your were admitted for syncope or siezures until you have seen by Primary MD or a Neurologist and advised to do so again.  Do not drive when taking Pain medications.  Do not take more  than prescribed Pain, Sleep and Anxiety Medications  Wear Seat belts while driving.   Please note You were cared for by a hospitalist during your hospital stay. If you have any questions about your discharge medications or the care you received while you were in the hospital after you are discharged, you can call the unit and asked to speak with the hospitalist on call if the hospitalist that took care of you is not available. Once you are discharged, your primary care physician will handle any further medical issues. Please note that NO REFILLS for any discharge medications will be authorized once you are discharged, as it is imperative that you return to your primary care physician (or establish a relationship with a primary care physician if you do not have one) for your aftercare needs so that they can reassess your need for medications and monitor your lab values.    Follow ups:    Follow-up Information     Cullop, Nena Alexander, NP Follow up.   Specialty: Nurse Practitioner Contact information: [place] [address] [phone]                 Wound care:     Discharge Exam:   Vitals:   03/12/21 1951 03/13/21 0014 03/13/21 0407 03/13/21 0741  BP: (!) 133/45 (!) 141/51 (!) 123/58 131/67  Pulse: (!) 46 (!) 50 (!) 44 (!) 41  Resp: '18 18 17 16  '$ Temp: 98.3 F (36.8 C) 98 F (36.7 C) 97.9 F (36.6 C) 97.7 F (36.5 C)  TempSrc: Oral  Oral   SpO2: 98% 100% 100% 100%  Weight:      Height:        Body mass index is 37.5 kg/m.  General exam: Pleasant, middle-aged Caucasian female.  Not in distress Skin: No rashes, lesions or ulcers. HEENT: Atraumatic, normocephalic, no obvious bleeding Lungs: Clear to auscultation bilaterally CVS: Regular rate and rhythm, no murmur GI/Abd soft, nontender, nondistended, bowel sound present CNS: Alert, awake, oriented x3.   Psychiatry: Mood appropriate Extremities: No pedal edema, no calf tenderness  Time  coordinating discharge: 35 minutes   The results of significant diagnostics from this hospitalization (including imaging, microbiology, ancillary and laboratory) are listed below for reference.    Procedures and Diagnostic Studies:   US Carotid Bilateral  Result Date: 03/12/2021 CLINICAL DATA:  Dizziness, TIA and hyperlipidemia. EXAM: BILATERAL CAROTID DUPLEX ULTRASOUND TECHNIQUE: Pearline Cables scale imaging, color Doppler and duplex ultrasound were performed of bilateral carotid and vertebral arteries in  the neck. COMPARISON:  None. FINDINGS: Criteria: Quantification of carotid stenosis is based on velocity parameters that correlate the residual internal carotid diameter with NASCET-based stenosis levels, using the diameter of the distal internal carotid lumen as the denominator for stenosis measurement. The following velocity measurements were obtained: RIGHT ICA:  121/55 distal, 84/31 proximal cm/sec CCA:  0000000 cm/sec SYSTOLIC ICA/CCA RATIO:  1.5 ECA:  96 cm/sec LEFT ICA:  89/34 cm/sec CCA:  [id number] cm/sec SYSTOLIC ICA/CCA RATIO:  1.0 ECA:  82 cm/sec RIGHT CAROTID ARTERY: Mild amount of partially calcified plaque at the level of the proximal right ICA. Estimated right ICA stenosis is less than 50%. RIGHT VERTEBRAL ARTERY: Antegrade flow with normal waveform and velocity. LEFT CAROTID ARTERY: Mild amount of eccentric partially calcified plaque at the level of the proximal left ICA. Estimated left ICA stenosis is less than 50%. LEFT VERTEBRAL ARTERY: Antegrade flow with normal waveform and velocity. IMPRESSION: Mild plaque in both proximal internal carotid arteries. Estimated bilateral ICA stenoses are less than 50%. Electronically Signed   By: Aletta Edouard M.D.   On: 03/12/2021 16:49   DG Chest Port 1 View  Result Date: 03/11/2021 CLINICAL DATA:  60 year old female with bradycardia. Dizziness and nausea since yesterday. EXAM: PORTABLE CHEST 1 VIEW COMPARISON:  Chest radiographs 06/28/2016 and earlier.  FINDINGS: Portable AP upright view at 0451 hours. Cardiac size is at the upper limits of normal to mildly enlarged. Other mediastinal contours are within normal limits. Visualized tracheal air column is within normal limits. Allowing for portable technique the lungs are clear. No pneumothorax or pleural effusion. No acute osseous abnormality identified. Acromioclavicular degeneration greater on the right. IMPRESSION: Borderline to mild cardiomegaly. No acute cardiopulmonary abnormality. Electronically Signed   By: Genevie Ann M.D.   On: 03/11/2021 05:19   ECHOCARDIOGRAM COMPLETE  Result Date: 03/11/2021    ECHOCARDIOGRAM REPORT   Patient Name:   Stacey [name] Date of Exam: 03/11/2021 Medical Rec #:  [id number]        Height:       66.0 in Accession #:    [id number]       Weight:       211.6 lb Date of Birth:  [birth year]       BSA:          2.048 m Patient Age:    26 years         BP:           117/56 mmHg Patient Gender: F                HR:           43 bpm. Exam Location:  [facility] Procedure: 2D Echo, Cardiac Doppler and Color Doppler Indications:     Other abnormalities of the heart R00.8  History:         Patient has prior history of Echocardiogram examinations, most                  recent 11/17/2015. COPD; Risk Factors:Diabetes. DVT.  Sonographer:     Sherrie Sport RDCS (AE) Referring Phys:  [id number] Victoriano Lain A THOMAS Diagnosing Phys: Yolonda Kida MD IMPRESSIONS  1. Left ventricular ejection fraction, by estimation, is 60 to 65%. The left ventricle has normal function. The left ventricle has no regional wall motion abnormalities. There is mild left ventricular hypertrophy. Left ventricular diastolic parameters were normal.  2. Right ventricular systolic function is normal. The right ventricular size is normal.  3.  The mitral valve is normal in structure. No evidence of mitral valve regurgitation.  4. The aortic valve is normal in structure. Aortic valve regurgitation is not visualized. FINDINGS  Left Ventricle:  Left ventricular ejection fraction, by estimation, is 60 to 65%. The left ventricle has normal function. The left ventricle has no regional wall motion abnormalities. The left ventricular internal cavity size was normal in size. There is  mild left ventricular hypertrophy. Left ventricular diastolic parameters were normal. Right Ventricle: The right ventricular size is normal. No increase in right ventricular wall thickness. Right ventricular systolic function is normal. Left Atrium: Left atrial size was normal in size. Right Atrium: Right atrial size was normal in size. Pericardium: There is no evidence of pericardial effusion. Mitral Valve: The mitral valve is normal in structure. No evidence of mitral valve regurgitation. Tricuspid Valve: The tricuspid valve is normal in structure. Tricuspid valve regurgitation is trivial. Aortic Valve: The aortic valve is normal in structure. Aortic valve regurgitation is not visualized. Aortic valve mean gradient measures 3.0 mmHg. Aortic valve peak gradient measures 5.4 mmHg. Aortic valve area, by VTI measures 2.79 cm. Pulmonic Valve: The pulmonic valve was normal in structure. Pulmonic valve regurgitation is not visualized. Aorta: The ascending aorta was not well visualized. IAS/Shunts: No atrial level shunt detected by color flow Doppler.  LEFT VENTRICLE PLAX 2D LVIDd:         5.10 cm  Diastology LVIDs:         3.30 cm  LV e' medial:    5.22 cm/s LV PW:         1.70 cm  LV E/e' medial:  19.2 LV IVS:        1.20 cm  LV e' lateral:   11.60 cm/s LVOT diam:     2.00 cm  LV E/e' lateral: 8.6 LV SV:         73 LV SV Index:   36 LVOT Area:     3.14 cm  LEFT ATRIUM            Index       RIGHT ATRIUM           Index LA diam:      3.30 cm  1.61 cm/m  RA Area:     18.70 cm LA Vol (A2C): 115.0 ml 56.14 ml/m RA Volume:   48.80 ml  23.82 ml/m LA Vol (A4C): 32.7 ml  15.96 ml/m  AORTIC VALVE                   PULMONIC VALVE AV Area (Vmax):    2.55 cm    PV Vmax:        0.81 m/s AV  Area (Vmean):   2.58 cm    PV Peak grad:   2.6 mmHg AV Area (VTI):     2.79 cm    RVOT Peak grad: 5 mmHg AV Vmax:           116.00 cm/s AV Vmean:          77.900 cm/s AV VTI:            0.261 m AV Peak Grad:      5.4 mmHg AV Mean Grad:      3.0 mmHg LVOT Vmax:         94.00 cm/s LVOT Vmean:        64.000 cm/s LVOT VTI:          0.232 m LVOT/AV VTI ratio:  0.89  AORTA Ao Root diam: 2.70 cm MITRAL VALVE                TRICUSPID VALVE MV Area (PHT): 4.46 cm     TR Peak grad:   10.8 mmHg MV Decel Time: 170 msec     TR Vmax:        164.00 cm/s MV E velocity: 100.00 cm/s MV A velocity: 76.70 cm/s   SHUNTS MV E/A ratio:  1.30         Systemic VTI:  0.23 m                             Systemic Diam: 2.00 cm Yolonda Kida MD Electronically signed by Yolonda Kida MD Signature Date/Time: 03/11/2021/5:04:16 PM    Final      Labs:   Basic Metabolic Panel: Recent Labs  Lab 03/10/21 2307 03/11/21 0530 03/11/21 0843 03/12/21 0527  NA 140 139  --  144  K 3.5 3.5  --  3.4*  CL 112* 109  --  116*  CO2 24 24  --  26  GLUCOSE 96 87  --  80  BUN 21* 26*  --  15  CREATININE 0.81 0.86 0.76 0.60  CALCIUM 9.2 9.4  --  9.1  MG 2.0  --   --   --   PHOS 2.4*  --   --   --    GFR Estimated Creatinine Clearance: 85.5 mL/min (by C-G formula based on SCr of 0.6 mg/dL). Liver Function Tests: Recent Labs  Lab 03/10/21 2307 03/11/21 0530 03/12/21 0527  AST '22 21 18  '$ ALT '22 20 17  '$ ALKPHOS 54 51 46  BILITOT 0.6 0.7 0.7  PROT 6.1* 6.3* 5.7*  ALBUMIN 3.7 3.9 3.5   No results for input(s): LIPASE, AMYLASE in the last 168 hours. No results for input(s): AMMONIA in the last 168 hours. Coagulation profile Recent Labs  Lab 03/11/21 0530  INR 1.2    CBC: Recent Labs  Lab 03/10/21 2307 03/11/21 0530 03/11/21 0843 03/12/21 0527  WBC 5.4 4.9 3.8* 4.4  NEUTROABS 2.2 2.0  --   --   HGB 11.4* 11.4* 11.5* 11.2*  HCT 34.7* 34.1* 34.4* 32.9*  MCV 90.1 89.5 91.0 88.7  PLT 161 143* 150 147*   Cardiac  Enzymes: No results for input(s): CKTOTAL, CKMB, CKMBINDEX, TROPONINI in the last 168 hours. BNP: Invalid input(s): POCBNP CBG: No results for input(s): GLUCAP in the last 168 hours. D-Dimer No results for input(s): DDIMER in the last 72 hours. Hgb A1c Recent Labs    03/11/21 0843  HGBA1C 4.9   Lipid Profile No results for input(s): CHOL, HDL, LDLCALC, TRIG, CHOLHDL, LDLDIRECT in the last 72 hours. Thyroid function studies Recent Labs    03/11/21 0530  TSH 1.990   Anemia work up No results for input(s): VITAMINB12, FOLATE, FERRITIN, TIBC, IRON, RETICCTPCT in the last 72 hours. Microbiology Recent Results (from the past 240 hour(s))  Urine Culture     Status: Abnormal   Collection Time: 03/11/21  5:30 AM   Specimen: Urine, Clean Catch  Result Value Ref Range Status   Specimen Description   Final    URINE, CLEAN CATCH Performed at [facility], [address] [place], [place] [address]    Special Requests   Final    NONE Performed at [facility], [address] [place], [place] [address]  Culture (A)  Final    <10,000 COLONIES/mL INSIGNIFICANT GROWTH Performed at [facility] [address], [place], [place] [address]    Report Status 03/12/2021 FINAL  Final  Resp Panel by RT-PCR (Flu A&B, Covid) Nasopharyngeal Swab     Status: None   Collection Time: 03/11/21  6:40 AM   Specimen: Nasopharyngeal Swab; Nasopharyngeal(NP) swabs in vial transport medium  Result Value Ref Range Status   SARS Coronavirus 2 by RT PCR NEGATIVE NEGATIVE Final    Comment: (NOTE) SARS-CoV-2 target nucleic acids are NOT DETECTED.  The SARS-CoV-2 RNA is generally detectable in upper respiratory specimens during the acute phase of infection. The lowest concentration of SARS-CoV-2 viral copies this assay can detect is 138 copies/mL. A negative result does not preclude SARS-Cov-2 infection and should not be used as the sole basis for treatment or other patient  management decisions. A negative result may occur with  improper specimen collection/handling, submission of specimen other than nasopharyngeal swab, presence of viral mutation(s) within the areas targeted by this assay, and inadequate number of viral copies(<138 copies/mL). A negative result must be combined with clinical observations, patient history, and epidemiological information. The expected result is Negative.  Fact Sheet for Patients:  EntrepreneurPulse.com.au  Fact Sheet for Healthcare Providers:  IncredibleEmployment.be  This test is no t yet approved or cleared by the [place] FDA and  has been authorized for detection and/or diagnosis of SARS-CoV-2 by FDA under an Emergency Use Authorization (EUA). This EUA will remain  in effect (meaning this test can be used) for the duration of the COVID-19 declaration under Section 564(b)(1) of the Act, 21 U.S.C.section 360bbb-3(b)(1), unless the authorization is terminated  or revoked sooner.       Influenza A by PCR NEGATIVE NEGATIVE Final   Influenza B by PCR NEGATIVE NEGATIVE Final    Comment: (NOTE) The Xpert Xpress SARS-CoV-2/FLU/RSV plus assay is intended as an aid in the diagnosis of influenza from Nasopharyngeal swab specimens and should not be used as a sole basis for treatment. Nasal washings and aspirates are unacceptable for Xpert Xpress SARS-CoV-2/FLU/RSV testing.  Fact Sheet for Patients: EntrepreneurPulse.com.au  Fact Sheet for Healthcare Providers: IncredibleEmployment.be  This test is not yet approved or cleared by the [place] FDA and has been authorized for detection and/or diagnosis of SARS-CoV-2 by FDA under an Emergency Use Authorization (EUA). This EUA will remain in effect (meaning this test can be used) for the duration of the COVID-19 declaration under Section 564(b)(1) of the Act, 21 U.S.C. section 360bbb-3(b)(1),  unless the authorization is terminated or revoked.  Performed at [facility], [address] [place], [place] [address]      Signed: Terrilee Croak  [facility] 03/13/2021, 2:03 PM

## 2021-10-07 ENCOUNTER — Telehealth: Payer: Self-pay | Admitting: Hematology and Oncology

## 2021-10-07 NOTE — Telephone Encounter (Signed)
Rescheduled appointment per providers template. Left message.  ? ?

## 2022-01-20 ENCOUNTER — Telehealth: Payer: Self-pay | Admitting: Hematology and Oncology

## 2022-01-20 NOTE — Telephone Encounter (Signed)
Pt called in requesting to r/s her appt with Dr. Chryl Heck. R/s appt per pt request, she is aware of new appt date and time.

## 2022-02-03 ENCOUNTER — Ambulatory Visit: Payer: Medicaid Other | Admitting: Hematology and Oncology

## 2022-02-08 ENCOUNTER — Ambulatory Visit: Payer: Medicaid Other | Admitting: Hematology and Oncology

## 2022-02-28 ENCOUNTER — Inpatient Hospital Stay: Payer: Medicaid Other | Attending: Hematology and Oncology | Admitting: Hematology and Oncology

## 2022-05-14 ENCOUNTER — Emergency Department
Admission: EM | Admit: 2022-05-14 | Discharge: 2022-05-15 | Discharge status: Against Medical Advice | Payer: Medicaid Other | Attending: Emergency Medicine | Admitting: Emergency Medicine

## 2022-05-14 ENCOUNTER — Encounter: Payer: Self-pay | Admitting: Emergency Medicine

## 2022-05-14 ENCOUNTER — Other Ambulatory Visit: Payer: Self-pay

## 2022-05-14 DIAGNOSIS — Z5321 Procedure and treatment not carried out due to patient leaving prior to being seen by health care provider: Secondary | ICD-10-CM | POA: Diagnosis not present

## 2022-05-14 DIAGNOSIS — R0602 Shortness of breath: Secondary | ICD-10-CM | POA: Diagnosis not present

## 2022-05-14 DIAGNOSIS — R1030 Lower abdominal pain, unspecified: Secondary | ICD-10-CM | POA: Diagnosis present

## 2022-05-14 NOTE — ED Triage Notes (Signed)
Pt reports abdominal pian for about 2 hrs. Pt reports shortness of breath. Pt reports she feels like she can't hardly sit up. Pt talks in complete sentences no respiratory distress noted

## 2022-05-14 NOTE — ED Triage Notes (Signed)
First nurse note- Pt from home via [facility] with c/o lower abd pain x several hrs.  Hx of diverticulitis.   18g Right arm by EMS

## 2022-05-15 LAB — URINALYSIS, ROUTINE W REFLEX MICROSCOPIC
Bacteria, UA: NONE SEEN
Bilirubin Urine: NEGATIVE
Glucose, UA: NEGATIVE mg/dL
Hgb urine dipstick: NEGATIVE
Ketones, ur: NEGATIVE mg/dL
Nitrite: NEGATIVE
Protein, ur: 30 mg/dL — AB
Specific Gravity, Urine: 1.016 (ref 1.005–1.030)
pH: 9 — ABNORMAL HIGH (ref 5.0–8.0)

## 2022-05-15 LAB — COMPREHENSIVE METABOLIC PANEL
ALT: 13 U/L (ref 0–44)
AST: 20 U/L (ref 15–41)
Albumin: 4.1 g/dL (ref 3.5–5.0)
Alkaline Phosphatase: 52 U/L (ref 38–126)
Anion gap: 8 (ref 5–15)
BUN: 20 mg/dL (ref 6–20)
CO2: 21 mmol/L — ABNORMAL LOW (ref 22–32)
Calcium: 9.7 mg/dL (ref 8.9–10.3)
Chloride: 110 mmol/L (ref 98–111)
Creatinine, Ser: 0.79 mg/dL (ref 0.44–1.00)
GFR, Estimated: >60 mL/min (ref >60)
Glucose, Bld: 96 mg/dL (ref 70–99)
Potassium: 3.7 mmol/L (ref 3.5–5.1)
Sodium: 139 mmol/L (ref 135–145)
Total Bilirubin: 0.7 mg/dL (ref 0.3–1.2)
Total Protein: 7.3 g/dL (ref 6.5–8.1)

## 2022-05-15 LAB — CBC
HCT: 39.3 % (ref 36.0–46.0)
Hemoglobin: 13 g/dL (ref 12.0–15.0)
MCH: 28.8 pg (ref 26.0–34.0)
MCHC: 33.1 g/dL (ref 30.0–36.0)
MCV: 87.1 fL (ref 80.0–100.0)
Platelets: 210 10*3/uL (ref 150–400)
RBC: 4.51 MIL/uL (ref 3.87–5.11)
RDW: 12.2 % (ref 11.5–15.5)
WBC: 11.5 10*3/uL — ABNORMAL HIGH (ref 4.0–10.5)
nRBC: 0 % (ref 0.0–0.2)

## 2022-05-15 LAB — LIPASE, BLOOD: Lipase: 51 U/L (ref 11–51)

## 2022-05-15 NOTE — ED Notes (Signed)
Pt approached front desk verbalizes she wants to leave RN encouraged pt to stay, pt verbalizes she wants her test results RN informed that MD will be able to review test results with pt. Pt upset about wait RN apologized for wait. Informed her to sign up for mychart for her to be able to see her results. Pt also asked for a number she can call to complaint about wait RN provided phone number. Rn removed IV pt left ambulatory accompanied by 2 gentleman no respiratory distress noted

## 2022-05-17 ENCOUNTER — Other Ambulatory Visit: Payer: Self-pay | Admitting: Internal Medicine

## 2022-05-17 ENCOUNTER — Other Ambulatory Visit (HOSPITAL_COMMUNITY): Payer: Self-pay | Admitting: Internal Medicine

## 2022-05-17 DIAGNOSIS — R0600 Dyspnea, unspecified: Secondary | ICD-10-CM

## 2022-06-02 ENCOUNTER — Ambulatory Visit: Admission: RE | Admit: 2022-06-02 | Payer: Medicaid Other | Source: Ambulatory Visit

## 2022-06-15 ENCOUNTER — Ambulatory Visit: Admission: RE | Admit: 2022-06-15 | Payer: Medicaid Other | Source: Ambulatory Visit

## 2023-01-02 ENCOUNTER — Other Ambulatory Visit: Payer: Self-pay

## 2023-01-02 ENCOUNTER — Emergency Department (HOSPITAL_COMMUNITY)
Admission: EM | Admit: 2023-01-02 | Discharge: 2023-01-02 | Disposition: A | Discharge status: Home / Self Care | Payer: Medicaid Other | Attending: Emergency Medicine | Admitting: Emergency Medicine

## 2023-01-02 ENCOUNTER — Emergency Department (HOSPITAL_COMMUNITY): Payer: Medicaid Other

## 2023-01-02 ENCOUNTER — Encounter (HOSPITAL_COMMUNITY): Payer: Self-pay

## 2023-01-02 DIAGNOSIS — Z79899 Other long term (current) drug therapy: Secondary | ICD-10-CM | POA: Insufficient documentation

## 2023-01-02 DIAGNOSIS — J449 Chronic obstructive pulmonary disease, unspecified: Secondary | ICD-10-CM | POA: Insufficient documentation

## 2023-01-02 DIAGNOSIS — E119 Type 2 diabetes mellitus without complications: Secondary | ICD-10-CM | POA: Diagnosis not present

## 2023-01-02 DIAGNOSIS — Z7901 Long term (current) use of anticoagulants: Secondary | ICD-10-CM | POA: Insufficient documentation

## 2023-01-02 DIAGNOSIS — M1611 Unilateral primary osteoarthritis, right hip: Secondary | ICD-10-CM | POA: Insufficient documentation

## 2023-01-02 DIAGNOSIS — I10 Essential (primary) hypertension: Secondary | ICD-10-CM | POA: Insufficient documentation

## 2023-01-02 DIAGNOSIS — M79651 Pain in right thigh: Secondary | ICD-10-CM | POA: Insufficient documentation

## 2023-01-02 LAB — URINALYSIS, ROUTINE W REFLEX MICROSCOPIC
Bilirubin Urine: NEGATIVE
Glucose, UA: NEGATIVE mg/dL
Hgb urine dipstick: NEGATIVE
Ketones, ur: NEGATIVE mg/dL
Leukocytes,Ua: NEGATIVE
Nitrite: NEGATIVE
Protein, ur: NEGATIVE mg/dL
Specific Gravity, Urine: 1.014 (ref 1.005–1.030)
pH: 8 (ref 5.0–8.0)

## 2023-01-02 MED ORDER — ACETAMINOPHEN 500 MG PO TABS
1000.0000 mg | ORAL_TABLET | Freq: Once | ORAL | Status: AC
  Administered 2023-01-02: 1000 mg via ORAL
  Filled 2023-01-02: qty 2

## 2023-01-02 MED ORDER — OXYCODONE-ACETAMINOPHEN 5-325 MG PO TABS: 1.0000 | ORAL_TABLET | Freq: Three times a day (TID) | ORAL | 0 refills | Status: AC | PRN

## 2023-01-02 MED ORDER — OXYCODONE-ACETAMINOPHEN 5-325 MG PO TABS
1.0000 | ORAL_TABLET | Freq: Once | ORAL | Status: AC
  Administered 2023-01-02: 1 via ORAL
  Filled 2023-01-02: qty 1

## 2023-01-02 MED ORDER — PREDNISONE 10 MG PO TABS: ORAL_TABLET | ORAL | 0 refills | Status: AC

## 2023-01-02 MED ORDER — PREDNISONE 50 MG PO TABS
60.0000 mg | ORAL_TABLET | Freq: Once | ORAL | Status: AC
  Administered 2023-01-02: 60 mg via ORAL
  Filled 2023-01-02: qty 1

## 2023-01-02 NOTE — ED Notes (Signed)
US at bedside

## 2023-01-02 NOTE — ED Notes (Signed)
Patient transported to X-ray 

## 2023-01-02 NOTE — Discharge Instructions (Addendum)
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  It is possible that you have inflammation of the bursa at your right lateral hip as well.  The prednisone should help relieve pain and inflammation.  Take your next dose tomorrow.  Also recommend a heating pad applied to the site for 20 minutes 3 times daily.  You have been prescribed a small quantity of oxycodone, uses medication sparingly, do not drive within 4 hours as it will make you drowsy.  Plan follow-up care with Dr. Dallas Schimke if your symptoms persist or do not improve with this treatment plan.

## 2023-01-02 NOTE — ED Provider Notes (Signed)
[facility] Provider Note   CSN: [id number] Arrival date & time: 01/02/23  4098     History  Chief Complaint  Patient presents with   Leg Swelling    Stacey [name] is a 62 y.o. 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  She has increased pain at the site today worse with standing prompting her ED visit today.  She has taken Tylenol without significant pain relief.  She denies injury or falls.  Also denies fevers or chills.  She usually uses a cane for assistance with ambulation but had difficulty today secondary to this thigh pain.  HPI     Home Medications Prior to Admission medications   Medication Sig Start Date End Date Taking? Authorizing Provider  oxyCODONE-acetaminophen (PERCOCET/ROXICET) 5-325 MG tablet Take 1 tablet by mouth every 8 (eight) hours as needed. 01/02/23  Yes Rithvik Orcutt, Raynelle Fanning, PA-C  predniSONE (DELTASONE) 10 MG tablet 6, 5, 4, 3, 2 then 1 tablet by mouth daily for 6 days total. 01/02/23  Yes Flem Enderle, Raynelle Fanning, PA-C  buPROPion (WELLBUTRIN XL) 300 MG 24 hr tablet Take 300 mg by mouth daily.     [provider]  busPIRone (BUSPAR) 15 MG tablet Take 2 tablets (30 mg total) by mouth 2 (two) times daily. 08/15/13   Coolidge Breeze, MD  escitalopram (LEXAPRO) 20 MG tablet Take 20 mg by mouth daily.     [provider]  FLOVENT HFA 220 MCG/ACT inhaler Inhale 1 puff into the lungs daily at 12 noon. 12/29/20   [provider]  LORazepam (ATIVAN) 0.5 MG tablet Take 0.5 mg by mouth 3 (three) times daily as needed for anxiety. 02/24/21   [provider]  pregabalin (LYRICA) 100 MG capsule Take 100 mg by mouth 2 (two) times daily. 02/24/21   [provider]  PROAIR HFA 108 (90  Base) MCG/ACT inhaler INHALE TWO PUFFS BY MOUTH EVERY FOUR HOURS AS NEEDED FOR SHORTNESS OFBREATH OR WHEEZING 03/07/18   Bacigalupo, Marzella Schlein, MD  simvastatin (ZOCOR) 10 MG tablet Take 10 mg by mouth daily at 12 noon. 02/24/21   [provider]  warfarin (COUMADIN) 10 MG tablet TAKE 1 TABLET BY MOUTH ONCE DAILY 10/17/18   Georgiana Spinner, NP  lisinopril (PRINIVIL,ZESTRIL) 5 MG tablet Take 1 tablet (5 mg total) by mouth daily. 05/24/17 06/15/19  Erasmo Downer, MD  promethazine (PHENERGAN) 25 MG tablet Take 1 tablet (25 mg total) by mouth every 6 (six) hours as needed for nausea or vomiting. 05/18/18 06/15/19  Burgess Amor, PA-C      Allergies    Hydrocodone, Nitroglycerin, Tramadol, Zofran [ondansetron hcl], Hydromorphone, Levaquin [levofloxacin in d5w], Toradol [ketorolac tromethamine], Shellfish allergy, Levofloxacin, Sulfa antibiotics, Tape, and Trazodone and nefazodone    Review of Systems   Review of Systems  Constitutional:  Negative for fever.  HENT:  Negative for congestion and sore throat.   Eyes: Negative.   Respiratory:  Negative for chest tightness and shortness of breath.   Cardiovascular:  Negative for chest pain.  Gastrointestinal:  Negative for abdominal pain and nausea.  Genitourinary: Negative.   Musculoskeletal:  Positive for arthralgias. Negative for joint swelling and neck pain.  Skin: Negative.  Negative for color change, rash and wound.  Neurological:  Negative for dizziness, weakness, light-headedness, numbness and headaches.  Psychiatric/Behavioral: Negative.    All other systems reviewed and are negative.   Physical Exam Updated Vital Signs BP (!) 146/61   Pulse 65   Resp 13   Ht 5\' 5"  (1.651 m)   Wt 127 kg   SpO2 98%   BMI 46.59 kg/m  Physical Exam Constitutional:      Appearance: She is well-developed.  HENT:     Head: Atraumatic.  Cardiovascular:     Comments: Pulses equal bilaterally Musculoskeletal:        General: Tenderness  present.     Cervical back: Normal range of motion.     Right upper leg: Tenderness present.     Comments: Patient is tender to palpation mid lateral right thigh.  There is no erythema, induration or lesions, skin is intact.  Although she endorses edema at the site, I do not appreciate any asymmetric edema in comparing both legs.  Pedal pulses are intact.  Pain is worsened with attempts at knee flexion and hip internal/external rotation.  Her pain localizes to her right greater trochanter region.  Skin:    General: Skin is warm and dry.  Neurological:     Mental Status: She is alert.     Sensory: No sensory deficit.     Motor: No weakness.     Deep Tendon Reflexes: Reflexes normal.     ED Results / Procedures / Treatments   Labs (all labs ordered are listed, but only abnormal results are displayed) Labs Reviewed  URINALYSIS, ROUTINE W REFLEX MICROSCOPIC    EKG None  Radiology US Venous Img Lower Unilateral Right  Result Date: 01/02/2023 CLINICAL DATA:  pain in thigh, h/o dvt, on xaralto EXAM: RIGHT LOWER EXTREMITY VENOUS DOPPLER ULTRASOUND TECHNIQUE: Gray-scale sonography with compression, as well as color and duplex ultrasound, were performed to evaluate the deep venous system(s) from the level of the common femoral vein through the popliteal and proximal calf veins. COMPARISON:  None Available. FINDINGS: VENOUS Normal compressibility of the common femoral, superficial femoral, and popliteal veins, as well as the visualized calf veins. Visualized portions of profunda femoral vein and great saphenous vein unremarkable. No filling defects to suggest DVT on grayscale or color Doppler imaging. Doppler waveforms show normal direction of venous flow, normal respiratory plasticity and response to augmentation. Limited views of the contralateral common femoral vein are unremarkable. IMPRESSION: No evidence of DVT in the right lower extremity. Electronically Signed   By: Feliberto Harts M.D.    On: 01/02/2023 10:41   DG Femur Min 2 Views Right  Result Date: 01/02/2023 CLINICAL DATA:  Severe pain in hip/femur. NKI. Pt states she has chronic hip pain and has needed a hip replacement for years. EXAM: RIGHT FEMUR 2 VIEWS COMPARISON:  07/31/2014 FINDINGS: End-stage degenerative changes of the right hip with lateral subluxation of the femoral head relative to the acetabulum. No fracture or dislocation. IMPRESSION: 1. No fracture or dislocation of the right femur. 2. End-stage degenerative changes of the right hip. Electronically Signed   By: Acquanetta Belling M.D.   On: 01/02/2023 10:06    Procedures Procedures    Medications Ordered in ED Medications  predniSONE (DELTASONE) tablet 60 mg (has no administration in time range)  acetaminophen (TYLENOL) tablet 1,000 mg (1,000 mg Oral Given 01/02/23 0958)  oxyCODONE-acetaminophen (PERCOCET/ROXICET) 5-325 MG per tablet 1 tablet (1 tablet Oral Given 01/02/23 1045)    ED Course/ Medical Decision Making/ A&P  Medical Decision Making Patient presenting with right mid thigh pain with known severe right hip DJD.  On exam she is tender from her lateral mid thigh up to her greater trochanter.  I suspect this could be a greater trochanteric bursitis, although could also be radiation of pain from her right hip joint.  Her ultrasound is negative for DVT, plain film imaging is negative for acute fracture or dislocation.  She is placed on prednisone, we discussed heat therapy, small quantity of oxycodone with caution regarding sedation.  Referral to orthopedics for follow-up care.  Amount and/or Complexity of Data Reviewed Labs: ordered. Radiology: ordered and independent interpretation performed.    Details: Imaging per above, plain film imaging was personally reviewed, agree with interpretation.           Final Clinical Impression(s) / ED Diagnoses Final diagnoses:  Right thigh pain  Osteoarthritis of right hip,  unspecified osteoarthritis type    Rx / DC Orders ED Discharge Orders          Ordered    predniSONE (DELTASONE) 10 MG tablet        01/02/23 1204    oxyCODONE-acetaminophen (PERCOCET/ROXICET) 5-325 MG tablet  Every 8 hours PRN        01/02/23 1204              Burgess Amor, PA-C 01/02/23 1214    Bethann Berkshire, MD 01/05/23 1046

## 2023-01-02 NOTE — ED Triage Notes (Addendum)
Pt comes in with complaints of rt leg swelling for about a month and pt states swelling worsening today. Pt states horrible pain while standing and uses a cane for assistance.

## 2023-01-02 NOTE — ED Notes (Signed)
Pt back from x-ray.

## 2024-01-07 ENCOUNTER — Encounter: Payer: Self-pay | Admitting: Emergency Medicine

## 2024-01-07 ENCOUNTER — Emergency Department: Payer: MEDICAID

## 2024-01-07 ENCOUNTER — Emergency Department
Admission: EM | Admit: 2024-01-07 | Discharge: 2024-01-07 | Disposition: A | Discharge status: Home / Self Care | Payer: MEDICAID | Attending: Emergency Medicine | Admitting: Emergency Medicine

## 2024-01-07 ENCOUNTER — Other Ambulatory Visit: Payer: Self-pay

## 2024-01-07 DIAGNOSIS — J449 Chronic obstructive pulmonary disease, unspecified: Secondary | ICD-10-CM | POA: Diagnosis not present

## 2024-01-07 DIAGNOSIS — I10 Essential (primary) hypertension: Secondary | ICD-10-CM | POA: Diagnosis not present

## 2024-01-07 DIAGNOSIS — R1013 Epigastric pain: Secondary | ICD-10-CM | POA: Diagnosis present

## 2024-01-07 DIAGNOSIS — E119 Type 2 diabetes mellitus without complications: Secondary | ICD-10-CM | POA: Insufficient documentation

## 2024-01-07 DIAGNOSIS — K2900 Acute gastritis without bleeding: Secondary | ICD-10-CM | POA: Insufficient documentation

## 2024-01-07 LAB — COMPREHENSIVE METABOLIC PANEL WITH GFR
ALT: 29 U/L (ref 0–44)
AST: 30 U/L (ref 15–41)
Albumin: 3.6 g/dL (ref 3.5–5.0)
Alkaline Phosphatase: 54 U/L (ref 38–126)
Anion gap: 10 (ref 5–15)
BUN: 21 mg/dL — ABNORMAL HIGH (ref 6–20)
CO2: 20 mmol/L — ABNORMAL LOW (ref 22–32)
Calcium: 9.1 mg/dL (ref 8.9–10.3)
Chloride: 108 mmol/L (ref 98–111)
Creatinine, Ser: 0.81 mg/dL (ref 0.44–1.00)
GFR, Estimated: >60 mL/min (ref >60)
Glucose, Bld: 105 mg/dL — ABNORMAL HIGH (ref 70–99)
Potassium: 3.7 mmol/L (ref 3.5–5.1)
Sodium: 138 mmol/L (ref 135–145)
Total Bilirubin: 0.6 mg/dL (ref 0.0–1.2)
Total Protein: 6.7 g/dL (ref 6.5–8.1)

## 2024-01-07 LAB — URINALYSIS, ROUTINE W REFLEX MICROSCOPIC
Bilirubin Urine: NEGATIVE
Glucose, UA: NEGATIVE mg/dL
Ketones, ur: NEGATIVE mg/dL
Nitrite: NEGATIVE
Protein, ur: 30 mg/dL — AB
Specific Gravity, Urine: 1.025 (ref 1.005–1.030)
pH: 5 (ref 5.0–8.0)

## 2024-01-07 LAB — LIPASE, BLOOD: Lipase: 45 U/L (ref 11–51)

## 2024-01-07 LAB — CBC
HCT: 38.4 % (ref 36.0–46.0)
Hemoglobin: 12.8 g/dL (ref 12.0–15.0)
MCH: 29 pg (ref 26.0–34.0)
MCHC: 33.3 g/dL (ref 30.0–36.0)
MCV: 86.9 fL (ref 80.0–100.0)
Platelets: 210 10*3/uL (ref 150–400)
RBC: 4.42 MIL/uL (ref 3.87–5.11)
RDW: 12.7 % (ref 11.5–15.5)
WBC: 4.5 10*3/uL (ref 4.0–10.5)
nRBC: 0 % (ref 0.0–0.2)

## 2024-01-07 LAB — POC URINE PREG, ED: Preg Test, Ur: NEGATIVE

## 2024-01-07 LAB — TROPONIN I (HIGH SENSITIVITY): Troponin I (High Sensitivity): 9 ng/L (ref <18)

## 2024-01-07 MED ORDER — CEPHALEXIN 500 MG PO CAPS: 500.0000 mg | ORAL_CAPSULE | Freq: Two times a day (BID) | ORAL | 0 refills | Status: AC

## 2024-01-07 MED ORDER — OMEPRAZOLE MAGNESIUM 20 MG PO TBEC: 20.0000 mg | DELAYED_RELEASE_TABLET | Freq: Every day | ORAL | 1 refills | Status: AC

## 2024-01-07 MED ORDER — DICYCLOMINE HCL 10 MG PO CAPS: 10.0000 mg | ORAL_CAPSULE | Freq: Three times a day (TID) | ORAL | 0 refills | Status: AC

## 2024-01-07 MED ORDER — PANTOPRAZOLE SODIUM 40 MG IV SOLR
40.0000 mg | Freq: Once | INTRAVENOUS | Status: AC
  Administered 2024-01-07: 40 mg via INTRAVENOUS
  Filled 2024-01-07: qty 10

## 2024-01-07 MED ORDER — SUCRALFATE 1 G PO TABS: 1.0000 g | ORAL_TABLET | Freq: Three times a day (TID) | ORAL | 0 refills | Status: AC

## 2024-01-07 MED ORDER — SODIUM CHLORIDE 0.9 % IV BOLUS
500.0000 mL | Freq: Once | INTRAVENOUS | Status: AC
  Administered 2024-01-07: 500 mL via INTRAVENOUS

## 2024-01-07 NOTE — ED Triage Notes (Signed)
 Pt c/o midsternal CP and abd pain for five days along with SOB. Pt states that pain feels similar to previous episode where she was diagnosed with C. Diff. Pt states that she has had one episode of diarrhea yesterday, and that her stool has changed to a tan color. Pt denies N/V.

## 2024-01-07 NOTE — ED Provider Notes (Signed)
 [facility] Provider Note    Event Date/Time   First MD Initiated Contact with Patient 01/07/24 1059     (approximate)   History   Abdominal pain   HPI  Stacey [name] is a 63 y.o. female with history of diabetes, hypertension, COPD, PE who presents with primary complaint of abdominal pain, patient describes diffuse abdominal discomfort.  She reports this feels similar to when she has had C. difficile in the past but she reports only 1 loose stool yesterday.  No fevers reported, mild nausea.  She also reports she has had diverticulitis which is felt similar in the past.  No dysuria reported.     Physical Exam   Triage Vital Signs: ED Triage Vitals  Encounter Vitals Group     BP 01/07/24 1009 (!) 150/79     Systolic BP Percentile --      Diastolic BP Percentile --      Pulse Rate 01/07/24 1009 73     Resp 01/07/24 1009 16     Temp 01/07/24 1009 98.6 F (37 C)     Temp Source 01/07/24 1009 Oral     SpO2 01/07/24 1009 99 %     Weight 01/07/24 1111 127 kg (279 lb 15.8 oz)     Height 01/07/24 1111 1.651 m (5\' 5" )     Head Circumference --      Peak Flow --      Pain Score 01/07/24 1010 6     Pain Loc --      Pain Education --      Exclude from Growth Chart --     Most recent vital signs: Vitals:   01/07/24 1009  BP: (!) 150/79  Pulse: 73  Resp: 16  Temp: 98.6 F (37 C)  SpO2: 99%     General: Awake, no distress.  CV:  Good peripheral perfusion.  Resp:  Normal effort.  Abd:  No distention.  Mild tenderness palpation primary in the left lower quadrant.  Umbilical hernia easily reducible, nontender Other:     ED Results / Procedures / Treatments   Labs (all labs ordered are listed, but only abnormal results are displayed) Labs Reviewed  COMPREHENSIVE METABOLIC PANEL WITH GFR - Abnormal; Notable for the following components:      Result Value   CO2 20 (*)    Glucose, Bld 105 (*)    BUN 21 (*)    All other components within  normal limits  URINALYSIS, ROUTINE W REFLEX MICROSCOPIC - Abnormal; Notable for the following components:   Color, Urine AMBER (*)    APPearance CLOUDY (*)    Hgb urine dipstick SMALL (*)    Protein, ur 30 (*)    Leukocytes,Ua TRACE (*)    Bacteria, UA MANY (*)    All other components within normal limits  CBC  LIPASE, BLOOD  POC URINE PREG, ED  TROPONIN I (HIGH SENSITIVITY)  TROPONIN I (HIGH SENSITIVITY)     EKG  ED ECG REPORT I, Bryson Carbine, the attending physician, personally viewed and interpreted this ECG.  Date: 01/07/2024  Rhythm: normal sinus rhythm QRS Axis: normal Intervals: normal ST/T Wave abnormalities: normal Narrative Interpretation: no evidence of acute ischemia    RADIOLOGY Chest x-ray viewed interpret by me, no acute abnormality    PROCEDURES:  Critical Care performed:   Procedures   MEDICATIONS ORDERED IN ED: Medications  sodium chloride  0.9 % bolus 500 mL (0 mLs Intravenous Stopped 01/07/24 1244)  pantoprazole  (PROTONIX ) injection 40 mg (40 mg Intravenous Given 01/07/24 1151)     IMPRESSION / MDM / ASSESSMENT AND PLAN / ED COURSE  I reviewed the triage vital signs and the nursing notes. Patient's presentation is most consistent with acute presentation with potential threat to life or bodily function.  Patient presents with abdominal pain as detailed above, differential includes diverticulitis, less likely C. difficile, gastritis, colitis, UTI  Lab work is overall quite reassuring making C. difficile less likely, normal lipase.  Will treat with IV Protonix , IV fluids, obtain CT without contrast to evaluate for diverticulitis  CT scan is overall reassuring, urinalysis is mixed, suspect contamination.  Patient reports the majority of her discomfort is in the epigastrium suspicious for gastritis, she reports drinking milk seems to help.  Will start her on Carafate, omeprazole, Bentyl , recommend close outpatient follow-up, return precautions  discussed.  Keflex  prescription provided if she does develop dysuria urinary frequency I asked her to be very careful about taking antibiotics given her history of C. difficile.      FINAL CLINICAL IMPRESSION(S) / ED DIAGNOSES   Final diagnoses:  Epigastric pain  Acute gastritis without hemorrhage, unspecified gastritis type     Rx / DC Orders   ED Discharge Orders          Ordered    sucralfate (CARAFATE) 1 g tablet  3 times daily with meals & bedtime        01/07/24 1229    omeprazole (PRILOSEC OTC) 20 MG tablet  Daily        01/07/24 1229    cephALEXin  (KEFLEX ) 500 MG capsule  2 times daily        01/07/24 1229    dicyclomine  (BENTYL ) 10 MG capsule  3 times daily before meals & bedtime        01/07/24 1229             Note:  This document was prepared using Dragon voice recognition software and may include unintentional dictation errors.   Bryson Carbine, MD 01/07/24 1328

## 2024-01-07 NOTE — ED Notes (Signed)
 Pt in CT.

## 2024-06-24 ENCOUNTER — Emergency Department: Payer: MEDICAID

## 2024-06-24 ENCOUNTER — Emergency Department
Admission: EM | Admit: 2024-06-24 | Discharge: 2024-06-25 | Disposition: A | Discharge status: Home / Self Care | Payer: MEDICAID | Attending: Emergency Medicine | Admitting: Emergency Medicine

## 2024-06-24 ENCOUNTER — Other Ambulatory Visit: Payer: Self-pay

## 2024-06-24 ENCOUNTER — Encounter: Payer: Self-pay | Admitting: *Deleted

## 2024-06-24 DIAGNOSIS — R202 Paresthesia of skin: Secondary | ICD-10-CM | POA: Diagnosis not present

## 2024-06-24 DIAGNOSIS — R059 Cough, unspecified: Secondary | ICD-10-CM | POA: Insufficient documentation

## 2024-06-24 DIAGNOSIS — R531 Weakness: Secondary | ICD-10-CM | POA: Insufficient documentation

## 2024-06-24 DIAGNOSIS — E119 Type 2 diabetes mellitus without complications: Secondary | ICD-10-CM | POA: Diagnosis not present

## 2024-06-24 DIAGNOSIS — J4489 Other specified chronic obstructive pulmonary disease: Secondary | ICD-10-CM | POA: Diagnosis not present

## 2024-06-24 DIAGNOSIS — Z79899 Other long term (current) drug therapy: Secondary | ICD-10-CM | POA: Diagnosis not present

## 2024-06-24 DIAGNOSIS — Z7901 Long term (current) use of anticoagulants: Secondary | ICD-10-CM | POA: Diagnosis not present

## 2024-06-24 DIAGNOSIS — I10 Essential (primary) hypertension: Secondary | ICD-10-CM | POA: Insufficient documentation

## 2024-06-24 DIAGNOSIS — R0602 Shortness of breath: Secondary | ICD-10-CM | POA: Diagnosis not present

## 2024-06-24 DIAGNOSIS — Z7951 Long term (current) use of inhaled steroids: Secondary | ICD-10-CM | POA: Insufficient documentation

## 2024-06-24 DIAGNOSIS — R079 Chest pain, unspecified: Secondary | ICD-10-CM | POA: Insufficient documentation

## 2024-06-24 LAB — BASIC METABOLIC PANEL WITH GFR
Anion gap: 12 (ref 5–15)
BUN: 22 mg/dL — ABNORMAL HIGH (ref 6–20)
CO2: 24 mmol/L (ref 22–32)
Calcium: 9.5 mg/dL (ref 8.9–10.3)
Chloride: 106 mmol/L (ref 98–111)
Creatinine, Ser: 0.93 mg/dL (ref 0.44–1.00)
GFR, Estimated: >60 mL/min (ref >60)
Glucose, Bld: 116 mg/dL — ABNORMAL HIGH (ref 70–99)
Potassium: 3.8 mmol/L (ref 3.5–5.1)
Sodium: 143 mmol/L (ref 135–145)

## 2024-06-24 LAB — CBC
HCT: 37.6 % (ref 36.0–46.0)
Hemoglobin: 12.4 g/dL (ref 12.0–15.0)
MCH: 29.2 pg (ref 26.0–34.0)
MCHC: 33 g/dL (ref 30.0–36.0)
MCV: 88.5 fL (ref 80.0–100.0)
Platelets: 218 K/uL (ref 150–400)
RBC: 4.25 MIL/uL (ref 3.87–5.11)
RDW: 12.9 % (ref 11.5–15.5)
WBC: 8.3 K/uL (ref 4.0–10.5)
nRBC: 0 % (ref 0.0–0.2)

## 2024-06-24 LAB — TROPONIN T, HIGH SENSITIVITY: Troponin T High Sensitivity: <15 ng/L (ref 0–19)

## 2024-06-24 LAB — CBG MONITORING, ED: Glucose-Capillary: 122 mg/dL — ABNORMAL HIGH (ref 70–99)

## 2024-06-24 LAB — PROTIME-INR
INR: 1.2 (ref 0.8–1.2)
Prothrombin Time: 15.8 s — ABNORMAL HIGH (ref 11.4–15.2)

## 2024-06-24 MED ORDER — SODIUM CHLORIDE 0.9 % IV BOLUS (SEPSIS)
1000.0000 mL | Freq: Once | INTRAVENOUS | Status: AC
  Administered 2024-06-25: 1000 mL via INTRAVENOUS

## 2024-06-24 NOTE — ED Provider Notes (Signed)
 [facility] Provider Note    Event Date/Time   First MD Initiated Contact with Patient 06/24/24 2300     (approximate)   History   Numbness and Blurred Vision   HPI  BRITIANY [name] is a 63 y.o. female with history of TIA, obesity, chronic pain, COPD, DVT, PE on Xarelto , hypertension, diabetes who presents to the emergency department with multiple complaints.  Patient states that she has had numbness in the back of her head, increased tingling in her fingertips bilaterally, chest pain, shortness of breath, cough.  No fever.  No headache or head injury.  Feels weak all over but no focal weakness.  No vomiting or diarrhea.  No urinary symptoms.  States she has been under a lot of stress recently.   History provided by patient.    Past Medical History:  Diagnosis Date   Acid reflux    Anxiety    Arthritis    Asthma    Benign lipomatous neoplasm of skin, subcu of right leg 02/17/2013   Bilateral knee pain    C. difficile colitis 11/08/2011   Cervicalgia    Chronic hip pain    Chronic thoracic spine pain    Chronic, continuous use of opioids    COPD (chronic obstructive pulmonary disease) (HCC)    Cyst    pt has cyst to legs for years, areas drain at time   Depression    Diverticulitis 2016   DVT (deep venous thrombosis) (HCC) 2014   Hair loss    Hypertension    Incisional hernia    in C-section incision   Lumbar spine pain    Musculoskeletal pain 11/08/2011   Neuropathic pain 11/10/2011   Pancreatitis 1991   Pulmonary embolism (HCC) 2014   Sleep apnea with use of continuous positive airway pressure (CPAP)    uses CPAP at night   Type 2 diabetes mellitus (HCC)    Vitamin D deficiency    Warfarin anticoagulation     Past Surgical History:  Procedure Laterality Date   CESAREAN SECTION     x2   CHOLECYSTECTOMY  1991   laproscopic   NOSE SURGERY  1982   SKIN BIOPSY     TONSILLECTOMY  1980    MEDICATIONS:  Prior to Admission  medications   Medication Sig Start Date End Date Taking? Authorizing Provider  buPROPion  (WELLBUTRIN  XL) 300 MG 24 hr tablet Take 300 mg by mouth daily.     [provider]  busPIRone  (BUSPAR ) 15 MG tablet Take 2 tablets (30 mg total) by mouth 2 (two) times daily. 08/15/13   Stefani Vernell BRAVO, MD  cephALEXin  (KEFLEX ) 500 MG capsule Take 1 capsule (500 mg total) by mouth 2 (two) times daily. 01/07/24   Arlander Charleston, MD  dicyclomine  (BENTYL ) 10 MG capsule Take 1 capsule (10 mg total) by mouth 4 (four) times daily -  before meals and at bedtime. 01/07/24   Arlander Charleston, MD  escitalopram  (LEXAPRO ) 20 MG tablet Take 20 mg by mouth daily.     [provider]  FLOVENT  HFA 220 MCG/ACT inhaler Inhale 1 puff into the lungs daily at 12 noon. 12/29/20   [provider]  LORazepam  (ATIVAN ) 0.5 MG tablet Take 0.5 mg by mouth 3 (three) times daily as needed for anxiety. 02/24/21   [provider]  omeprazole  (PRILOSEC  OTC) 20 MG tablet Take 1 tablet (20 mg total) by mouth daily. 01/07/24 01/06/25  Arlander Charleston, MD  oxyCODONE -acetaminophen  (PERCOCET/ROXICET) [phone]  MG tablet Take 1 tablet by mouth every 8 (eight) hours as needed. 01/02/23   Idol, Julie, PA-C  predniSONE  (DELTASONE ) 10 MG tablet 6, 5, 4, 3, 2 then 1 tablet by mouth daily for 6 days total. 01/02/23   Idol, Julie, PA-C  pregabalin (LYRICA) 100 MG capsule Take 100 mg by mouth 2 (two) times daily. 02/24/21   [provider]  PROAIR  HFA 108 (90 Base) MCG/ACT inhaler INHALE TWO PUFFS BY MOUTH EVERY FOUR HOURS AS NEEDED FOR SHORTNESS OFBREATH OR WHEEZING 03/07/18   Bacigalupo, Jon HERO, MD  simvastatin  (ZOCOR ) 10 MG tablet Take 10 mg by mouth daily at 12 noon. 02/24/21   [provider]  sucralfate  (CARAFATE ) 1 g tablet Take 1 tablet (1 g total) by mouth 4 (four) times daily -  with meals and at bedtime for 15 days. 01/07/24 01/22/24  Arlander Charleston, MD  warfarin (COUMADIN ) 10 MG tablet TAKE 1 TABLET BY MOUTH ONCE  DAILY 10/17/18   Brown, Fallon E, NP  lisinopril  (PRINIVIL ,ZESTRIL ) 5 MG tablet Take 1 tablet (5 mg total) by mouth daily. 05/24/17 06/15/19  Bacigalupo, Angela M, MD  promethazine  (PHENERGAN ) 25 MG tablet Take 1 tablet (25 mg total) by mouth every 6 (six) hours as needed for nausea or vomiting. 05/18/18 06/15/19  Birdena Clarity, PA-C    Physical Exam   Triage Vital Signs: ED Triage Vitals  Encounter Vitals Group     BP 06/24/24 1853 (!) 147/88     Girls Systolic BP Percentile --      Girls Diastolic BP Percentile --      Boys Systolic BP Percentile --      Boys Diastolic BP Percentile --      Pulse Rate 06/24/24 1853 67     Resp 06/24/24 1853 18     Temp 06/24/24 1853 98.5 F (36.9 C)     Temp Source 06/24/24 1853 Oral     SpO2 06/24/24 1853 100 %     Weight 06/24/24 1854 (!) 350 lb (158.8 kg)     Height 06/24/24 1854 5' 6 (1.676 m)     Head Circumference --      Peak Flow --      Pain Score 06/24/24 1854 7     Pain Loc --      Pain Education --      Exclude from Growth Chart --     Most recent vital signs: Vitals:   06/25/24 0330 06/25/24 0745  BP: (!) 145/74 (!) 161/73  Pulse: 68 69  Resp: 18 17  Temp:  98.2 F (36.8 C)  SpO2:  96%    CONSTITUTIONAL: Alert, responds appropriately to questions.  Chronically ill-appearing, obese HEAD: Normocephalic, atraumatic EYES: Conjunctivae clear, pupils appear equal, sclera nonicteric ENT: normal nose; moist mucous membranes NECK: Supple, normal ROM CARD: RRR; S1 and S2 appreciated RESP: Normal chest excursion without splinting or tachypnea; breath sounds clear and equal bilaterally; no wheezes, no rhonchi, no rales, no hypoxia or respiratory distress, speaking full sentences ABD/GI: Non-distended; soft, non-tender, no rebound, no guarding, no peritoneal signs BACK: The back appears normal EXT: Normal ROM in all joints; no deformity noted, no edema, no calf tenderness or calf swelling SKIN: Normal color for age and race; warm;  no rash on exposed skin NEURO: Moves all extremities equally, normal speech, reports diminished sensation in her left leg compared to her right, no drift, cranial nerves II through XII intact PSYCH: The patient's mood and manner are appropriate.  ED Results / Procedures / Treatments   LABS: (all labs ordered are listed, but only abnormal results are displayed) Labs Reviewed  BASIC METABOLIC PANEL WITH GFR - Abnormal; Notable for the following components:      Result Value   Glucose, Bld 116 (*)    BUN 22 (*)    All other components within normal limits  PROTIME-INR - Abnormal; Notable for the following components:   Prothrombin Time 15.8 (*)    All other components within normal limits  URINALYSIS, ROUTINE W REFLEX MICROSCOPIC - Abnormal; Notable for the following components:   Color, Urine YELLOW (*)    APPearance HAZY (*)    Bacteria, UA MANY (*)    All other components within normal limits  CBG MONITORING, ED - Abnormal; Notable for the following components:   Glucose-Capillary 122 (*)    All other components within normal limits  RESP PANEL BY RT-PCR (RSV, FLU A&B, COVID)  RVPGX2  URINE CULTURE  CBC  TSH  MAGNESIUM   TROPONIN T, HIGH SENSITIVITY  TROPONIN T, HIGH SENSITIVITY     EKG:  EKG Interpretation Date/Time:  Monday June 24 2024 18:57:53 EST Ventricular Rate:  68 PR Interval:  214 QRS Duration:  84 QT Interval:  374 QTC Calculation: 397 R Axis:   18  Text Interpretation: Sinus rhythm with 1st degree A-V block Low voltage QRS Cannot rule out Anteroseptal infarct (cited on or before 14-May-2022) Abnormal ECG When compared with ECG of 07-Jan-2024 10:19, Sinus rhythm has replaced Junctional rhythm Confirmed by Neomi Neptune [phone]) on 06/24/2024 11:13:48 PM         RADIOLOGY: My personal review and interpretation of imaging: MRI brain shows no stroke.  Chest x-ray clear.  I have personally reviewed all radiology reports.   MR BRAIN WO  CONTRAST Result Date: 06/25/2024 EXAM: MRI BRAIN WITHOUT CONTRAST 06/25/2024 04:33:08 AM TECHNIQUE: Multiplanar multisequence MRI of the head/brain was performed without the administration of intravenous contrast. COMPARISON: CT of the head dated 06/24/2024. CLINICAL HISTORY: Neuro deficit, acute, stroke suspected. FINDINGS: BRAIN AND VENTRICLES: No acute infarct. No intracranial hemorrhage. No mass. No midline shift. No hydrocephalus. There is moderate diffuse cerebral white matter disease present. The sella is unremarkable. Normal flow voids. ORBITS: No acute abnormality. SINUSES AND MASTOIDS: No acute abnormality. BONES AND SOFT TISSUES: Normal marrow signal. No acute soft tissue abnormality. IMPRESSION: 1. No acute intracranial abnormality. 2. Moderate diffuse cerebral white matter disease. Electronically signed by: Evalene Coho MD 06/25/2024 04:49 AM EST RP Workstation: [id number]   CT Head Wo Contrast Result Date: 06/24/2024 EXAM: CT HEAD WITHOUT CONTRAST 06/24/2024 08:21:14 PM TECHNIQUE: CT of the head was performed without the administration of intravenous contrast. Automated exposure control, iterative reconstruction, and/or weight based adjustment of the mA/kV was utilized to reduce the radiation dose to as low as reasonably achievable. COMPARISON: CT brain examination performed on 12/08/1999. CLINICAL HISTORY: Headache, new onset (Age >= 51y). FINDINGS: BRAIN AND VENTRICLES: No acute hemorrhage. No evidence of acute infarct. No hydrocephalus. No extra-axial collection. No mass effect or midline shift. Atherosclerosis of skullbase vasculature. ORBITS: No acute abnormality. SINUSES: No acute abnormality. SOFT TISSUES AND SKULL: No acute soft tissue abnormality. No skull fracture. IMPRESSION: 1. No acute intracranial abnormality. Electronically signed by: Luke Bun MD 06/24/2024 08:39 PM EST RP Workstation: [id number]   DG Chest 2 View Result Date: 06/24/2024 CLINICAL DATA:  numbness in  head, chest pain EXAM: CHEST - 2 VIEW COMPARISON:  01/07/2024 FINDINGS: No focal airspace consolidation, pleural effusion,  or pneumothorax. Mild cardiomegaly. Aortic atherosclerosis. No acute fracture or destructive lesions. Multilevel thoracic osteophytosis. IMPRESSION: Mild cardiomegaly. No pneumonia or pulmonary edema. Electronically Signed   By: Rogelia Myers M.D.   On: 06/24/2024 19:39     PROCEDURES:  Critical Care performed: No     .1-3 Lead EKG Interpretation  Performed by: Alanson Hausmann, Josette SAILOR, DO Authorized by: Nakeesha Bowler, Josette SAILOR, DO     Interpretation: normal     ECG rate:  68   ECG rate assessment: normal     Rhythm: sinus rhythm     Ectopy: none     Conduction: normal       IMPRESSION / MDM / ASSESSMENT AND PLAN / ED COURSE  I reviewed the triage vital signs and the nursing notes.    Patient here with multiple complaints.  Complains of numbness, generalized weakness, chest pain, shortness of breath, cough.  The patient is on the cardiac monitor to evaluate for evidence of arrhythmia and/or significant heart rate changes.   DIFFERENTIAL DIAGNOSIS (includes but not limited to):   TIA, CVA, intracranial hemorrhage, ACS, PE, pneumonia, CHF, pneumothorax, viral URI, UTI, dehydration, anemia, electrolyte derangement, thyroid dysfunction   Patient's presentation is most consistent with acute presentation with potential threat to life or bodily function.   PLAN: Will obtain labs, urine.  CT head reviewed and interpreted by myself and the radiologist is unremarkable.  Will obtain MRI brain to evaluate for stroke.  Outside of tPA window given symptoms ongoing for several days.  EKG nonischemic.  Will obtain COVID and flu swab, chest x-ray.   MEDICATIONS GIVEN IN ED: Medications  sodium chloride  0.9 % bolus 1,000 mL (0 mLs Intravenous Stopped 06/25/24 0106)  LORazepam  (ATIVAN ) injection 1 mg (1 mg Intravenous Given 06/25/24 0327)     ED COURSE: COVID, flu and RSV  negative.  Troponin x 2 negative.  TSH normal.  Normal electrolytes.  Chest x-ray reviewed and interpreted by myself and radiologist and shows no pneumonia or edema.  MRI brain shows no acute abnormality.  UA is pending.  Signed out to oncoming ED physician at 8:30 AM.   CONSULTS:  none   OUTSIDE RECORDS REVIEWED: Reviewed recent family medicine notes.       FINAL CLINICAL IMPRESSION(S) / ED DIAGNOSES   Final diagnoses:  Paresthesias  Chest pain, unspecified type  Cough, unspecified type     Rx / DC Orders   ED Discharge Orders     None        Note:  This document was prepared using Dragon voice recognition software and may include unintentional dictation errors.   Tereasa Yilmaz, Josette SAILOR, DO 06/25/24 [phone]

## 2024-06-24 NOTE — ED Notes (Signed)
 Pulse ox and BP cuff placed on pt at this time. Call light within reach. Pt has no further needs at this time.

## 2024-06-24 NOTE — ED Triage Notes (Signed)
 First nurse note: Pt reports neck pain x2 wks and numbness in posterior head started today.

## 2024-06-24 NOTE — ED Notes (Signed)
 No code stroke at this time per dr dorothyann.   head ct ordered per dr dorothyann

## 2024-06-24 NOTE — ED Triage Notes (Addendum)
 Pt brought over from [facility] for eval.  Pt has numbness to top of head and blurry vision since 2pm today.   Pt also has neck pain and intermittent chest pain.   Pt also reports facial pain.  No n/v pt reports intermittent sob.  No cough Pt alert speech clear.   Fsbs 122 in triage.

## 2024-06-25 ENCOUNTER — Other Ambulatory Visit: Payer: Self-pay

## 2024-06-25 ENCOUNTER — Emergency Department: Payer: MEDICAID

## 2024-06-25 LAB — MAGNESIUM: Magnesium: 2.2 mg/dL (ref 1.7–2.4)

## 2024-06-25 LAB — RESP PANEL BY RT-PCR (RSV, FLU A&B, COVID)  RVPGX2
Influenza A by PCR: NEGATIVE
Influenza B by PCR: NEGATIVE
Resp Syncytial Virus by PCR: NEGATIVE
SARS Coronavirus 2 by RT PCR: NEGATIVE

## 2024-06-25 LAB — URINALYSIS, ROUTINE W REFLEX MICROSCOPIC
Bilirubin Urine: NEGATIVE
Glucose, UA: NEGATIVE mg/dL
Hgb urine dipstick: NEGATIVE
Ketones, ur: NEGATIVE mg/dL
Leukocytes,Ua: NEGATIVE
Nitrite: NEGATIVE
Protein, ur: NEGATIVE mg/dL
Specific Gravity, Urine: 1.016 (ref 1.005–1.030)
pH: 5 (ref 5.0–8.0)

## 2024-06-25 LAB — TROPONIN T, HIGH SENSITIVITY: Troponin T High Sensitivity: <15 ng/L (ref 0–19)

## 2024-06-25 LAB — TSH: TSH: 1.26 u[IU]/mL (ref 0.350–4.500)

## 2024-06-25 MED ORDER — SODIUM CHLORIDE 0.9 % IV BOLUS (SEPSIS): 1000.0000 mL | Freq: Once | INTRAVENOUS | Status: DC

## 2024-06-25 MED ORDER — LORAZEPAM 2 MG/ML IJ SOLN
1.0000 mg | Freq: Once | INTRAMUSCULAR | Status: AC
  Administered 2024-06-25: 1 mg via INTRAVENOUS
  Filled 2024-06-25: qty 1

## 2024-06-25 NOTE — ED Notes (Signed)
 Pt currently meeting with the chaplain prior to discharge

## 2024-06-25 NOTE — ED Provider Notes (Signed)
 Care of this patient assumed from prior physician at 0700 pending urinalysis and disposition. Please see prior physician note for further details.  Briefly this is a 63 year old female presenting with weakness.  Extensive workup reassuring including labs, viral swab, CT head, MRI brain, chest x-Stacey [name], EKG.  Pending urinalysis at time of signout.  Anticipated discharge with antibiotics as indicated following this.  Urinalysis equivocal for infection, but overall not suggestive of acute infection.  Discussed with patient.  She reports no change in her urinary frequency, denies dysuria.  Considered antibiotics but patient would prefer to hold off and send urine culture which has been added on.  She is comfortable with plan for discharge home and outpatient follow-up.  Referral to PCP placed.  Strict return precautions provided.  Patient discharged in stable condition.   Levander Slate, MD 06/25/24 [phone]

## 2024-06-25 NOTE — Discharge Instructions (Addendum)
 You were seen in the ER today for evaluation of your chest pain and weakness.  Your testing today fortunately did not show an emergency cause for these.  Please follow-up with a primary care doctor for further evaluation.  If you are not able to get into your prior primary care office I have included information for several in the area.  Return to the ER for new or worsening symptoms.

## 2024-06-25 NOTE — ED Notes (Signed)
 Pt wheeled herself to restroom. Pt denies need for assistance in bathroom. Collection hat placed to catch needed urine sample.

## 2024-06-25 NOTE — Progress Notes (Signed)
  Chaplain On-Call responded to a call from [facility] [place], who reported that the patient is tearful and outwardly emotional, and Joen requested Chaplain support.  Chaplain met with the patient in the hallway outside ED-25.  Chaplain offered compassionate presence and listening support as the patient described many recent deaths of loved ones, including her 61 year old son; the son's father; her two aunts,and a close friend.  She stated that she feels distant from God and she is questioning God's purposes through all her losses.  Chaplain provided much spiritual and emotional support and prayer for the patient and her compound grief before her discharge.  Chaplain Bebe Ardean EMERSON Hershal., [facility]

## 2024-06-25 NOTE — ED Notes (Signed)
 Pt wheeled self back to [facility]. Pt wanting to stay in wheelchair anticipating discharge

## 2024-06-27 LAB — URINE CULTURE: Culture: >=100000 — AB

## 2024-08-26 ENCOUNTER — Ambulatory Visit: Payer: MEDICAID | Admitting: Surgery

## 2024-09-06 ENCOUNTER — Encounter: Payer: Self-pay | Admitting: Internal Medicine

## 2024-09-09 ENCOUNTER — Ambulatory Visit: Payer: MEDICAID | Admitting: Surgery
